# Patient Record
Sex: Female | Born: 1945
Health system: Southern US, Community
[De-identification: ages and names within clinical notes are randomized; demographics above are authoritative.]

## PROBLEM LIST (undated history)

## (undated) DIAGNOSIS — R112 Nausea with vomiting, unspecified: Secondary | ICD-10-CM

## (undated) DIAGNOSIS — M1990 Unspecified osteoarthritis, unspecified site: Secondary | ICD-10-CM

## (undated) DIAGNOSIS — G4730 Sleep apnea, unspecified: Secondary | ICD-10-CM

## (undated) DIAGNOSIS — I10 Essential (primary) hypertension: Secondary | ICD-10-CM

## (undated) DIAGNOSIS — R079 Chest pain, unspecified: Secondary | ICD-10-CM

## (undated) DIAGNOSIS — F419 Anxiety disorder, unspecified: Secondary | ICD-10-CM

## (undated) DIAGNOSIS — E7800 Pure hypercholesterolemia, unspecified: Secondary | ICD-10-CM

## (undated) DIAGNOSIS — R0600 Dyspnea, unspecified: Secondary | ICD-10-CM

## (undated) DIAGNOSIS — K469 Unspecified abdominal hernia without obstruction or gangrene: Secondary | ICD-10-CM

## (undated) DIAGNOSIS — E785 Hyperlipidemia, unspecified: Secondary | ICD-10-CM

## (undated) DIAGNOSIS — K449 Diaphragmatic hernia without obstruction or gangrene: Secondary | ICD-10-CM

## (undated) DIAGNOSIS — K219 Gastro-esophageal reflux disease without esophagitis: Secondary | ICD-10-CM

## (undated) DIAGNOSIS — C801 Malignant (primary) neoplasm, unspecified: Secondary | ICD-10-CM

## (undated) DIAGNOSIS — Z9889 Other specified postprocedural states: Secondary | ICD-10-CM

## (undated) DIAGNOSIS — R0609 Other forms of dyspnea: Secondary | ICD-10-CM

## (undated) DIAGNOSIS — H25019 Cortical age-related cataract, unspecified eye: Secondary | ICD-10-CM

## (undated) HISTORY — DX: Gastro-esophageal reflux disease without esophagitis: K21.9

## (undated) HISTORY — DX: Diaphragmatic hernia without obstruction or gangrene: K44.9

## (undated) HISTORY — PX: APPENDECTOMY: SHX54

## (undated) HISTORY — DX: Unspecified osteoarthritis, unspecified site: M19.90

## (undated) HISTORY — DX: Malignant (primary) neoplasm, unspecified: C80.1

## (undated) HISTORY — DX: Essential (primary) hypertension: I10

## (undated) HISTORY — DX: Pure hypercholesterolemia, unspecified: E78.00

## (undated) HISTORY — PX: CARPAL TUNNEL RELEASE: SHX101

## (undated) HISTORY — DX: Unspecified abdominal hernia without obstruction or gangrene: K46.9

## (undated) HISTORY — PX: MASTECTOMY: SHX3

## (undated) HISTORY — DX: Anxiety disorder, unspecified: F41.9

---

## 1998-12-20 HISTORY — PX: APPENDECTOMY: SHX54

## 2000-07-20 HISTORY — PX: ABDOMINAL HYSTERECTOMY: SHX81

## 2004-12-15 ENCOUNTER — Ambulatory Visit: Payer: Self-pay | Admitting: Unknown Physician Specialty

## 2004-12-17 ENCOUNTER — Ambulatory Visit: Payer: Self-pay | Admitting: Unknown Physician Specialty

## 2005-12-17 ENCOUNTER — Ambulatory Visit: Payer: Self-pay | Admitting: Unknown Physician Specialty

## 2006-03-03 ENCOUNTER — Ambulatory Visit: Payer: Self-pay | Admitting: Unknown Physician Specialty

## 2006-06-10 ENCOUNTER — Ambulatory Visit: Payer: Self-pay | Admitting: Unknown Physician Specialty

## 2007-02-22 ENCOUNTER — Ambulatory Visit: Payer: Self-pay | Admitting: Unknown Physician Specialty

## 2007-03-09 ENCOUNTER — Ambulatory Visit: Payer: Self-pay | Admitting: Unknown Physician Specialty

## 2007-03-20 ENCOUNTER — Ambulatory Visit: Payer: Self-pay | Admitting: Gastroenterology

## 2008-04-09 ENCOUNTER — Ambulatory Visit: Payer: Self-pay | Admitting: Unknown Physician Specialty

## 2009-10-21 ENCOUNTER — Ambulatory Visit: Payer: Self-pay | Admitting: Unknown Physician Specialty

## 2009-10-24 ENCOUNTER — Ambulatory Visit: Payer: Self-pay | Admitting: Unknown Physician Specialty

## 2009-12-20 HISTORY — PX: COLONOSCOPY: SHX174

## 2010-10-23 ENCOUNTER — Ambulatory Visit: Payer: Self-pay | Admitting: Gastroenterology

## 2010-12-07 ENCOUNTER — Encounter: Payer: Self-pay | Admitting: Surgery

## 2010-12-22 ENCOUNTER — Ambulatory Visit: Payer: Self-pay | Admitting: Unknown Physician Specialty

## 2010-12-23 ENCOUNTER — Encounter: Payer: Self-pay | Admitting: Surgery

## 2011-01-12 ENCOUNTER — Ambulatory Visit: Payer: Self-pay | Admitting: Gastroenterology

## 2011-01-20 ENCOUNTER — Encounter: Payer: Self-pay | Admitting: Surgery

## 2011-12-22 ENCOUNTER — Ambulatory Visit: Payer: Self-pay | Admitting: Unknown Physician Specialty

## 2011-12-28 ENCOUNTER — Ambulatory Visit: Payer: Self-pay | Admitting: Unknown Physician Specialty

## 2012-06-28 ENCOUNTER — Ambulatory Visit: Payer: Self-pay | Admitting: Surgery

## 2012-10-25 ENCOUNTER — Encounter: Payer: Self-pay | Admitting: Internal Medicine

## 2012-10-25 ENCOUNTER — Ambulatory Visit (INDEPENDENT_AMBULATORY_CARE_PROVIDER_SITE_OTHER): Payer: Medicare Other | Admitting: Internal Medicine

## 2012-10-25 VITALS — BP 138/75 | HR 83 | Temp 98.0°F | Resp 18 | Ht 64.0 in | Wt 143.0 lb

## 2012-10-25 DIAGNOSIS — R5383 Other fatigue: Secondary | ICD-10-CM

## 2012-10-25 DIAGNOSIS — N6459 Other signs and symptoms in breast: Secondary | ICD-10-CM

## 2012-10-25 DIAGNOSIS — N6452 Nipple discharge: Secondary | ICD-10-CM

## 2012-10-25 DIAGNOSIS — J309 Allergic rhinitis, unspecified: Secondary | ICD-10-CM

## 2012-10-25 DIAGNOSIS — Z23 Encounter for immunization: Secondary | ICD-10-CM

## 2012-10-25 DIAGNOSIS — R5381 Other malaise: Secondary | ICD-10-CM

## 2012-10-25 DIAGNOSIS — E7800 Pure hypercholesterolemia, unspecified: Secondary | ICD-10-CM

## 2012-10-25 DIAGNOSIS — K219 Gastro-esophageal reflux disease without esophagitis: Secondary | ICD-10-CM | POA: Insufficient documentation

## 2012-10-25 DIAGNOSIS — I10 Essential (primary) hypertension: Secondary | ICD-10-CM | POA: Insufficient documentation

## 2012-10-25 DIAGNOSIS — N63 Unspecified lump in unspecified breast: Secondary | ICD-10-CM

## 2012-10-25 DIAGNOSIS — Z0000 Encounter for general adult medical examination without abnormal findings: Secondary | ICD-10-CM

## 2012-10-25 NOTE — Progress Notes (Signed)
Subjective:    Patient ID: Carla Ryan, female    DOB: [birth year], 66 y.o.   MRN: [id number]  HPI 66 year old female with past history of hypertension and hypercholesterolemia who comes in today to follow up on these issues as well as to establish care.  She states that starting after Christmas 2013 she noticed some left breast drainage.   Was evaluated at [facility] and had mammogram and ultrasound 01/03/12.  Test clear.  Saw Dr Hyacinth Meeker.  Had a culture and labs (regarding the discharge) and these were negative.  Stopped her HRT.  Was referred to Dr Katrinka Blazing.  Had a needle biopsy in the office. States it was ok, but she felt the biopsy was done at a different place then where she thought it should have been.  She has some minimal discomfort in the left breast and is still able to express something from the nipple.  She states she had a follow up left breast mammo - 6 months.  She desires a second opinion.    She also has a history of a hiatal hernia and reflux.  Over the last two weeks she has had more trouble with reflux.  On omeprazole.  Some increased drainage.  No dysphagia.  She has been under some increased stress (worrying about her breast and she has been remodeling).   She also has a history of scoliosis.  Some intermittent back pain.  Has seen Dr. Roselyn Bering.    Past Medical History  Diagnosis Date  . Hypertension   . GERD (gastroesophageal reflux disease)   . Hypercholesterolemia   . Osteoarthritis     scoliosis, degenerative disc dz, knee, carpal tunnel  . Anxiety     situational stress and anxiety    Review of Systems Patient denies any headache, lightheadedness or dizziness.  No significant sinus symptoms.  Some drainage.  No chest pain, tightness or palpitations.  No increased shortness of breath, cough or congestion.  No nausea or vomiting. Some increased acid reflux as outlined.  No dysphagia or odynophagia.  No abdominal pain or cramping.  No bowel change, such as diarrhea,  constipation, BRBPR or melana.  No urine change.  Nipple discharge as outlined.       Objective:   Physical Exam Filed Vitals:   10/25/12 0853  BP: 138/75  Pulse: 83  Temp: 98 F (36.7 C)  Resp: 48   66 year old female in no acute distress.   HEENT:  Nares- clear.  Oropharynx - without lesions. NECK:  Supple.  Nontender.  No audible bruit.  HEART:  Appears to be regular. LUNGS:  No crackles or wheezing audible.  Respirations even and unlabored.  RADIAL PULSE:  Equal bilaterally.    BREASTS:  No nipple retraction present. Could express clear fluid from the left breast.  Could not appreciate any distinct nodules or axillary adenopathy.  ABDOMEN:  Soft, nontender.  Bowel sounds present and normal.  No audible abdominal bruit. EXTREMITIES:  No increased edema present.  DP pulses palpable and equal bilaterally.          Assessment & Plan:  NIPPLE DISCHARGE.  Need to obtain records from [facility].  Sounds like she had a culture and probable prolactin level.  Cx negative and apparently prolactin level wnl.  Saw Dr Katrinka Blazing.  S/P a negative biopsy.  Wants a second opinion.  Will refer to Dr Barbaraann Cao for evaluation.    BACK PAIN.  Has scoliosis.  Seeing ortho.   FATIGUE.  Check cbc, met c and tsh.   HEALTH MAINTENANCE.  Physical 12/08/11.  Is s/p hysterectomy.  Colonoscopy 2011 - negative.

## 2012-10-25 NOTE — Patient Instructions (Addendum)
It was nice meeting you today.  I am sorry you have not been feeling well.  I am going to schedule an appt with a surgeon for a second opinion.  We will also schedule a time to have labs.  I want you to continue your omeprazole in the am (as you have been doing) and start Ranitidine 150mg  - take 30 minutes before your evening meal.

## 2012-10-25 NOTE — Assessment & Plan Note (Addendum)
On Omeprazole.  EGD 2011 revealed erosive gastritis.  With some break through symptoms.  Will continue the omeprazole in the am and start Ranitidine 150mg  in the evening.  Follow.  Get her back in soon to reassess.

## 2012-11-01 ENCOUNTER — Other Ambulatory Visit: Payer: Medicare Other

## 2012-11-01 ENCOUNTER — Other Ambulatory Visit (INDEPENDENT_AMBULATORY_CARE_PROVIDER_SITE_OTHER): Payer: Medicare Other

## 2012-11-01 DIAGNOSIS — R5383 Other fatigue: Secondary | ICD-10-CM

## 2012-11-01 DIAGNOSIS — R5381 Other malaise: Secondary | ICD-10-CM

## 2012-11-01 DIAGNOSIS — E7800 Pure hypercholesterolemia, unspecified: Secondary | ICD-10-CM

## 2012-11-01 LAB — CBC WITH DIFFERENTIAL/PLATELET
Eosinophils Relative: 3.1 % (ref 0.0–5.0)
HCT: 40.4 % (ref 36.0–46.0)
Monocytes Relative: 6.4 % (ref 3.0–12.0)
Neutrophils Relative %: 59.1 % (ref 43.0–77.0)
Platelets: 251 10*3/uL (ref 150.0–400.0)
RBC: 4.36 Mil/uL (ref 3.87–5.11)
WBC: 4.8 10*3/uL (ref 4.5–10.5)

## 2012-11-01 LAB — COMPREHENSIVE METABOLIC PANEL
Albumin: 3.8 g/dL (ref 3.5–5.2)
CO2: 28 mEq/L (ref 19–32)
Calcium: 9.3 mg/dL (ref 8.4–10.5)
Chloride: 101 mEq/L (ref 96–112)
GFR: 64.38 mL/min (ref >60.00)
Glucose, Bld: 95 mg/dL (ref 70–99)
Potassium: 3.9 mEq/L (ref 3.5–5.1)
Sodium: 137 mEq/L (ref 135–145)
Total Protein: 6.8 g/dL (ref 6.0–8.3)

## 2012-11-01 LAB — LIPID PANEL: VLDL: 26.2 mg/dL (ref 0.0–40.0)

## 2012-11-01 LAB — TSH: TSH: 1.64 u[IU]/mL (ref 0.35–5.50)

## 2012-11-11 ENCOUNTER — Encounter: Payer: Self-pay | Admitting: Internal Medicine

## 2012-11-11 NOTE — Assessment & Plan Note (Signed)
Blood pressure as outlined.  Same meds.  Check metabolic panel with next labs.  Follow pressures.    

## 2012-11-11 NOTE — Assessment & Plan Note (Signed)
Low cholesterol diet and exercise.  Check lipid panel with next fasting labs.    

## 2012-11-11 NOTE — Assessment & Plan Note (Signed)
Instructed on saline flushes.  Continue Flonase.  Treat reflux.  Follow.

## 2012-12-20 HISTORY — PX: BREAST BIOPSY: SHX20

## 2013-01-03 ENCOUNTER — Ambulatory Visit (INDEPENDENT_AMBULATORY_CARE_PROVIDER_SITE_OTHER): Payer: Medicare Other | Admitting: Internal Medicine

## 2013-01-03 ENCOUNTER — Encounter: Payer: Self-pay | Admitting: Internal Medicine

## 2013-01-03 VITALS — BP 120/70 | HR 88 | Temp 98.4°F | Ht 60.5 in | Wt 139.2 lb

## 2013-01-03 DIAGNOSIS — J309 Allergic rhinitis, unspecified: Secondary | ICD-10-CM

## 2013-01-03 DIAGNOSIS — K219 Gastro-esophageal reflux disease without esophagitis: Secondary | ICD-10-CM

## 2013-01-03 DIAGNOSIS — R410 Disorientation, unspecified: Secondary | ICD-10-CM

## 2013-01-03 DIAGNOSIS — I10 Essential (primary) hypertension: Secondary | ICD-10-CM

## 2013-01-03 DIAGNOSIS — E7800 Pure hypercholesterolemia, unspecified: Secondary | ICD-10-CM

## 2013-01-03 DIAGNOSIS — F29 Unspecified psychosis not due to a substance or known physiological condition: Secondary | ICD-10-CM

## 2013-01-03 LAB — BASIC METABOLIC PANEL
BUN: 17 mg/dL (ref 6–23)
Chloride: 102 mEq/L (ref 96–112)
Glucose, Bld: 78 mg/dL (ref 70–99)
Potassium: 3.8 mEq/L (ref 3.5–5.1)

## 2013-01-07 ENCOUNTER — Encounter: Payer: Self-pay | Admitting: Internal Medicine

## 2013-01-07 NOTE — Assessment & Plan Note (Signed)
Low cholesterol diet and exercise.  Follow lipid panel.   

## 2013-01-07 NOTE — Assessment & Plan Note (Signed)
Continues on omeprazole.  Zantac at night.  Follow.

## 2013-01-07 NOTE — Assessment & Plan Note (Signed)
flonase nasal spray and saline nasal spray as directed.  The previous cough and congestion improved.  Follow.

## 2013-01-07 NOTE — Progress Notes (Signed)
Subjective:    Patient ID: Carla Ryan, female    DOB: 1946-07-12, 67 y.o.   MRN: [id number]  HPI 67 year old female with past history of hypertension and hypercholesterolemia who comes in today for a scheduled follow up.  She states that starting after Christmas 2013 she noticed some left breast drainage.   Was evaluated at [facility] and had mammogram and ultrasound 01/03/12.  Test clear.  Saw Dr Hyacinth Meeker.  Had a culture and labs (regarding the discharge) and these were negative.  Stopped her HRT.  Was referred to Dr Katrinka Blazing.  Had a needle biopsy in the office. States it was ok, but she felt the biopsy was done at a different place then where she thought it should have been.  See last note for details.  She desires a second opinion.  Now seeing Dr Barbaraann Cao.  Planning for a biopsy 01/15/13.  She also reports that 12/19/12 she was sick.  Had fever.  Increased congestion and cough.  Was riding in the car and had a "violent" coughing episode.  Husband pulled over to get her something to drink.  States he was gone out of the car for approximately 10 minutes.   When he returned, she was confused.  She was asking questions about her family, etc.  No slurring of speech.  No other focal neurological changes.  She was questioning whether or not she could have fallen asleep.  She then went in to check her blood pressure and used the bathroom.  She did not initially remember if she had checked her blood pressure.  She quickly came back to her normal self and has had no episodes since (or prior).  Still has a little congestion in her throat.  Doing much better.  She also has a history of a hiatal hernia and reflux.  On omeprazole.  No dysphagia.     Past Medical History  Diagnosis Date  . Hypertension   . GERD (gastroesophageal reflux disease)   . Hypercholesterolemia   . Osteoarthritis     scoliosis, degenerative disc dz, knee, carpal tunnel  . Anxiety     situational stress and anxiety     Current  Outpatient Prescriptions on File Prior to Visit  Medication Sig Dispense Refill  . Biotin 5000 MCG TABS Take 1 tablet by mouth daily.      . Calcium Carbonate-Vitamin D (CALCIUM 600 + D PO) Take 600 mg by mouth 2 (two) times daily.      . cholecalciferol (VITAMIN D) 1000 UNITS tablet Take 2,000 Units by mouth daily.       . fexofenadine (ALLEGRA) 180 MG tablet Take 180 mg by mouth daily.      . fluticasone (FLONASE) 50 MCG/ACT nasal spray Place 2 sprays into the nose daily.      . Multiple Vitamin (MULTIVITAMIN) tablet Take 1 tablet by mouth daily.      Marland Kitchen omeprazole (PRILOSEC) 20 MG capsule Take 2 by mouth every morning      . ranitidine (ZANTAC) 150 MG tablet Take 150 mg by mouth daily.      Marland Kitchen triamterene-hydrochlorothiazide (DYAZIDE) 37.5-25 MG per capsule Take 1 capsule by mouth daily.      Marland Kitchen aspirin 81 MG tablet Take 81 mg by mouth daily.      . fish oil-omega-3 fatty acids 1000 MG capsule Take 1 g by mouth daily.        Review of Systems  Patient denies any headache, lightheadedness or dizziness.  No significant sinus symptoms.  Some drainage.  No chest pain, tightness or palpitations.  No increased shortness of breath, cough or congestion.  No nausea or vomiting. Some increased acid reflux as outlined.  No dysphagia or odynophagia.  No abdominal pain or cramping.  No bowel change, such as diarrhea, constipation, BRBPR or melana.  No urine change.  Nipple discharge as outlined.       Objective:   Physical Exam  Filed Vitals:   01/03/13 0903  BP: 120/70  Pulse: 88  Temp: 98.4 F (36.9 C)   Blood pressure recheck:  148/82, pulse 57  67 year old female in no acute distress.   HEENT:  Nares- clear.  Oropharynx - without lesions. NECK:  Supple.  Nontender.  No audible bruit.  HEART:  Appears to be regular. LUNGS:  No crackles or wheezing audible.  Respirations even and unlabored.  RADIAL PULSE:  Equal bilaterally.  ABDOMEN:  Soft, nontender.  Bowel sounds present and normal.  No  audible abdominal bruit. EXTREMITIES:  No increased edema present.  DP pulses palpable and equal bilaterally.          Assessment & Plan:  NIPPLE DISCHARGE.  See last note for details.  Seeing Dr Barbaraann Cao now.  Planning for evaluation 01/15/13.   MENTAL STATUS CHANGES.  See above.  Unclear as to the exact etiology.  It could be related to her being sick and possibly falling asleep.  Consider the possibility of TIA.  Symptoms completely resolved and have not reoccurred.  Recommend daily aspirin.  Will schedule MRI brain and carotid ultrasound to evaluate further.  Follow closely.  If any change in symptoms or reoccurring symptoms, she is to be evaluated immediately.     BACK PAIN.  Has scoliosis.  Seeing ortho.   HEALTH MAINTENANCE.  Physical 12/08/11.  Is s/p hysterectomy.  Colonoscopy 2011 - negative.  Schedule a physical next visit.

## 2013-01-07 NOTE — Assessment & Plan Note (Signed)
Blood pressure a little elevated on my check.  Have her spot check her pressure.  Follow.

## 2013-01-10 ENCOUNTER — Ambulatory Visit: Payer: Self-pay | Admitting: Internal Medicine

## 2013-01-12 ENCOUNTER — Telehealth: Payer: Self-pay | Admitting: Internal Medicine

## 2013-01-12 NOTE — Telephone Encounter (Signed)
You have results.  Please notify pt 

## 2013-01-12 NOTE — Telephone Encounter (Signed)
Pt called stating she left message on voice mail checking ultra sound and mri  rsults she had done wednesay.  Pt would like to get results prior to surgery on monday

## 2013-01-12 NOTE — Telephone Encounter (Signed)
Left message on patient's home voice mail that results were all ok.

## 2013-01-12 NOTE — Telephone Encounter (Signed)
You have results.  Please notify pt

## 2013-01-12 NOTE — Telephone Encounter (Signed)
This is your pt

## 2013-01-24 ENCOUNTER — Encounter: Payer: Self-pay | Admitting: Internal Medicine

## 2013-02-09 ENCOUNTER — Telehealth: Payer: Self-pay | Admitting: Internal Medicine

## 2013-02-09 NOTE — Telephone Encounter (Signed)
Needing refills on Triamterene/ HCTZ 37.5/25 mg and Fluticasone Propinate nasal spray 50 mcg's. Pt uses [facility].

## 2013-02-12 MED ORDER — TRIAMTERENE-HCTZ 37.5-25 MG PO CAPS: 1.0000 | ORAL_CAPSULE | Freq: Every day | ORAL | Status: DC

## 2013-02-12 MED ORDER — FLUTICASONE PROPIONATE 50 MCG/ACT NA SUSP: 2.0000 | Freq: Every day | NASAL | Status: DC

## 2013-02-12 NOTE — Telephone Encounter (Signed)
Sent in to pharmacy.  

## 2013-02-20 ENCOUNTER — Ambulatory Visit: Payer: Medicare Other | Admitting: Internal Medicine

## 2013-02-26 ENCOUNTER — Encounter: Payer: Self-pay | Admitting: Internal Medicine

## 2013-02-26 ENCOUNTER — Ambulatory Visit (INDEPENDENT_AMBULATORY_CARE_PROVIDER_SITE_OTHER): Payer: Medicare Other | Admitting: Internal Medicine

## 2013-02-26 VITALS — BP 110/60 | HR 75 | Temp 98.4°F | Ht 60.5 in | Wt 141.8 lb

## 2013-02-26 DIAGNOSIS — J309 Allergic rhinitis, unspecified: Secondary | ICD-10-CM

## 2013-02-26 DIAGNOSIS — Z1239 Encounter for other screening for malignant neoplasm of breast: Secondary | ICD-10-CM

## 2013-02-26 DIAGNOSIS — E7800 Pure hypercholesterolemia, unspecified: Secondary | ICD-10-CM

## 2013-02-26 DIAGNOSIS — K219 Gastro-esophageal reflux disease without esophagitis: Secondary | ICD-10-CM

## 2013-02-26 DIAGNOSIS — M549 Dorsalgia, unspecified: Secondary | ICD-10-CM

## 2013-02-26 DIAGNOSIS — I10 Essential (primary) hypertension: Secondary | ICD-10-CM

## 2013-02-26 NOTE — Progress Notes (Signed)
Subjective:    Patient ID: Carla Ryan, female    DOB: 03/27/1946, 67 y.o.   MRN: [id number]  HPI 67 year old female with past history of hypertension and hypercholesterolemia who comes in today for a scheduled follow up.  She states that starting after Christmas 2013 she noticed some left breast drainage.   Was evaluated at [facility] and had mammogram and ultrasound 01/03/12.  Test clear.  Saw Dr Hyacinth Meeker.  Had a culture and labs (regarding the discharge) and these were negative.  Stopped her HRT.  Was referred to Dr Katrinka Blazing.  Had a needle biopsy in the office. States it was ok, but she felt the biopsy was done at a different place then where she thought it should have been.  See last note for details.  She desired a second opinion.  Saw Dr Carla Ryan.  Had biopsy 01/15/13 and subsequently underwent a needle local excision of the area and the pathology returned benign - intraductal papilloma.  She has done well.  No further drainage.  She does report some persistent intermittent back/shoulder discomfort.  Flares intermittently.  Worse over the last few weeks.  Has tried muscle relaxors and physical therapy without relief.  Tylenol does seem to help some.  She request referral to ortho for evaluation.  No radiation of the pain down her arms.  She also is still having some reflux despite omeprazole in the am and zantac in the evening.  Previously had some RUQ discomfort.  Has had this intermittently for years.  Has had an abdominal ultrasound and HIDA scan x 2.  No pain now.     Past Medical History  Diagnosis Date  . Hypertension   . GERD (gastroesophageal reflux disease)   . Hypercholesterolemia   . Osteoarthritis     scoliosis, degenerative disc dz, knee, carpal tunnel  . Anxiety     situational stress and anxiety     Current Outpatient Prescriptions on File Prior to Visit  Medication Sig Dispense Refill  . aspirin 81 MG tablet Take 81 mg by mouth daily.      . Biotin 5000 MCG TABS Take 1  tablet by mouth daily.      . Calcium Carbonate-Vitamin D (CALCIUM 600 + D PO) Take 600 mg by mouth 2 (two) times daily.      . cholecalciferol (VITAMIN D) 1000 UNITS tablet Take 2,000 Units by mouth daily.       . fexofenadine (ALLEGRA) 180 MG tablet Take 180 mg by mouth daily.      . fish oil-omega-3 fatty acids 1000 MG capsule Take 1 g by mouth daily.      . fluticasone (FLONASE) 50 MCG/ACT nasal spray Place 2 sprays into the nose daily.  16 g  1  . Multiple Vitamin (MULTIVITAMIN) tablet Take 1 tablet by mouth daily.      Marland Kitchen omeprazole (PRILOSEC) 20 MG capsule Take 2 by mouth every morning      . ranitidine (ZANTAC) 150 MG tablet Take 150 mg by mouth daily.      Marland Kitchen triamterene-hydrochlorothiazide (DYAZIDE) 37.5-25 MG per capsule Take 1 each (1 capsule total) by mouth daily.  90 capsule  1   No current facility-administered medications on file prior to visit.    Review of Systems Patient denies any headache, lightheadedness or dizziness.  No significant sinus symptoms.  No chest pain, tightness or palpitations.  No increased shortness of breath, cough or congestion.  No nausea or vomiting. Some increased acid reflux  as outlined.  No dysphagia or odynophagia.  No abdominal pain or cramping.  No bowel change, such as diarrhea, constipation, BRBPR or melana.  No urine change.  Nipple discharge resolved.  No further episodes of mental status changes, etc.       Objective:   Physical Exam  Filed Vitals:   02/26/13 1605  BP: 110/60  Pulse: 75  Temp: 98.4 F (36.9 C)   Blood pressure recheck:  140/78, pulse 61  67 year old female in no acute distress.   HEENT:  Nares- clear.  Oropharynx - without lesions. NECK:  Supple.  Nontender.  No audible bruit.  HEART:  Appears to be regular. LUNGS:  No crackles or wheezing audible.  Respirations even and unlabored.  RADIAL PULSE:  Equal bilaterally.  ABDOMEN:  Soft, nontender.  Bowel sounds present and normal.  No audible abdominal  bruit. EXTREMITIES:  No increased edema present.  DP pulses palpable and equal bilaterally.          Assessment & Plan:  NIPPLE DISCHARGE.  See last note for details.  Saw Dr Carla Ryan.  S/p excision - intraductal papilloma - benign.  Discharge resolved.  Doing well.    MENTAL STATUS CHANGES.  See last note.  Unclear as to the exact etiology.  Taking daily aspirin.  MRI revealed no acute changes.  Carotid ultrasound negative for any hemodynamically significant stenosis.  No reoccurrence.  Discussed further w/up - including neurology referral.  Wants to monitor.   BACK PAIN.  Upper back.  See above.  Request referral to ortho.  Has tried physical therapy - worsened.  Muscle relaxant - no help.    HEALTH MAINTENANCE.  Physical 12/08/11.  Is s/p hysterectomy.  Colonoscopy 2011 - negative.

## 2013-02-26 NOTE — Assessment & Plan Note (Signed)
Blood pressure as outlined.  Same meds.  Check metabolic panel with next labs.  Follow pressures.

## 2013-02-26 NOTE — Assessment & Plan Note (Signed)
On Omeprazole.  EGD 2011 revealed erosive gastritis.  With some break through symptoms.  Also taking zantac in the evening.  Will refer to GI for evaluation.

## 2013-02-26 NOTE — Assessment & Plan Note (Signed)
Continue Flonase.  Treat reflux.  Follow.

## 2013-02-26 NOTE — Assessment & Plan Note (Signed)
Low cholesterol diet and exercise.  Check lipid panel with next fasting labs.    

## 2013-03-14 ENCOUNTER — Ambulatory Visit: Payer: Self-pay | Admitting: Internal Medicine

## 2013-03-21 ENCOUNTER — Encounter: Payer: Self-pay | Admitting: Internal Medicine

## 2013-03-21 DIAGNOSIS — G4733 Obstructive sleep apnea (adult) (pediatric): Secondary | ICD-10-CM

## 2013-04-03 ENCOUNTER — Encounter: Payer: Self-pay | Admitting: Internal Medicine

## 2013-05-29 ENCOUNTER — Ambulatory Visit (INDEPENDENT_AMBULATORY_CARE_PROVIDER_SITE_OTHER): Payer: Medicare Other | Admitting: Internal Medicine

## 2013-05-29 ENCOUNTER — Encounter: Payer: Self-pay | Admitting: Internal Medicine

## 2013-05-29 VITALS — BP 158/84 | HR 75 | Temp 98.3°F | Ht 60.5 in | Wt 146.5 lb

## 2013-05-29 DIAGNOSIS — J309 Allergic rhinitis, unspecified: Secondary | ICD-10-CM

## 2013-05-29 DIAGNOSIS — E7800 Pure hypercholesterolemia, unspecified: Secondary | ICD-10-CM

## 2013-05-29 DIAGNOSIS — K219 Gastro-esophageal reflux disease without esophagitis: Secondary | ICD-10-CM

## 2013-05-29 DIAGNOSIS — I10 Essential (primary) hypertension: Secondary | ICD-10-CM

## 2013-05-29 MED ORDER — TRIAMTERENE-HCTZ 37.5-25 MG PO CAPS: 1.0000 | ORAL_CAPSULE | Freq: Every day | ORAL | Status: DC

## 2013-05-29 MED ORDER — FLUTICASONE PROPIONATE 50 MCG/ACT NA SUSP: 2.0000 | Freq: Every day | NASAL | Status: DC

## 2013-05-31 ENCOUNTER — Telehealth: Payer: Self-pay | Admitting: Internal Medicine

## 2013-05-31 ENCOUNTER — Encounter: Payer: Self-pay | Admitting: Internal Medicine

## 2013-05-31 NOTE — Assessment & Plan Note (Signed)
Low cholesterol diet and exercise.  Check lipid panel with next fasting labs.    

## 2013-05-31 NOTE — Telephone Encounter (Signed)
Changed to cpe

## 2013-05-31 NOTE — Assessment & Plan Note (Signed)
Continue Flonase.  Treat reflux.  Follow.

## 2013-05-31 NOTE — Progress Notes (Signed)
Subjective:    Patient ID: Carla Ryan, female    DOB: 1946-01-31, 67 y.o.   MRN: [id number]  HPI 67 year old female with past history of hypertension and hypercholesterolemia who comes in today for a scheduled follow up.  She states that starting after Christmas 2013 she noticed some left breast drainage.   Was evaluated at [facility] and had mammogram and ultrasound 01/03/12.  Test clear.  Saw Dr Hyacinth Meeker.  Had a culture and labs (regarding the discharge) and these were negative.  Stopped her HRT.  Was referred to Dr Katrinka Blazing.  Had a needle biopsy in the office. States it was ok, but she felt the biopsy was done at a different place then where she thought it should have been.  See last note for details.  She desired a second opinion.  Saw Dr Barbaraann Cao.  Had biopsy 01/15/13 and subsequently underwent a needle local excision of the area and the pathology returned benign - intraductal papilloma.  She has done well.  No further drainage.  She does report some persistent intermittent back/shoulder discomfort.  Flares intermittently.  Has tried muscle relaxors and physical therapy without relief.  Tylenol does seem to help some.  After last visit, she saw Dr Hyacinth Meeker [facility] ortho). She is now going to [facility].   She also saw Dr Marva Panda.  Was placed on Dexilant.  This has helped the reflux symptoms.  Stools more formed.  Occasional burping.  When she saw Dr Marva Panda, she had an eye infection.  Was referred to [facility].  Given Tobradex.  Improved, but starting to have some irritation again.  Plans to call them today.  Overall she feels she is doing relatively well.  Blood pressure at home averaging 120s/60s.  138/72 - highest readin).       Past Medical History  Diagnosis Date  . Hypertension   . GERD (gastroesophageal reflux disease)   . Hypercholesterolemia   . Osteoarthritis     scoliosis, degenerative disc dz, knee, carpal tunnel  . Anxiety     situational stress and anxiety     Current Outpatient Prescriptions on File Prior to Visit  Medication Sig Dispense Refill  . aspirin 81 MG tablet Take 81 mg by mouth daily.      . Biotin 5000 MCG TABS Take 1 tablet by mouth daily.      . Calcium Carbonate-Vitamin D (CALCIUM 600 + D PO) Take 600 mg by mouth 2 (two) times daily.      . cholecalciferol (VITAMIN D) 1000 UNITS tablet Take 2,000 Units by mouth daily.       . fexofenadine (ALLEGRA) 180 MG tablet Take 180 mg by mouth daily.      . fish oil-omega-3 fatty acids 1000 MG capsule Take 1 g by mouth daily.      . Multiple Vitamin (MULTIVITAMIN) tablet Take 1 tablet by mouth daily.      Marland Kitchen omeprazole (PRILOSEC) 20 MG capsule Take 2 by mouth every morning      . ranitidine (ZANTAC) 150 MG tablet Take 150 mg by mouth daily.       No current facility-administered medications on file prior to visit.    Review of Systems Patient denies any headache, lightheadedness or dizziness.  Eye infection as outlined.  No significant sinus symptoms.  No chest pain, tightness or palpitations.  No increased shortness of breath, cough or congestion.  No nausea or vomiting. Acid reflux better on dexilant.  No dysphagia or odynophagia.  No abdominal pain or cramping.  No bowel change, such as diarrhea, constipation, BRBPR or melana.  No urine change.  Nipple discharge resolved.  Blood pressures as outlined.       Objective:   Physical Exam  Filed Vitals:   05/29/13 0938  BP: 158/84  Pulse: 75  Temp: 98.3 F (36.8 C)   Blood pressure recheck:  148-150/78, pulse 66  67 year old female in no acute distress.   HEENT:  Nares- clear.  Oropharynx - without lesions. NECK:  Supple.  Nontender.  No audible bruit.  HEART:  Appears to be regular. LUNGS:  No crackles or wheezing audible.  Respirations even and unlabored.  RADIAL PULSE:  Equal bilaterally.  ABDOMEN:  Soft, nontender.  Bowel sounds present and normal.  No audible abdominal bruit. EXTREMITIES:  No increased edema present.  DP  pulses palpable and equal bilaterally.          Assessment & Plan:  NIPPLE DISCHARGE.  See last note for details.  Saw Dr Barbaraann Cao.  S/p excision - intraductal papilloma - benign.  Discharge resolved.  Doing well.  Mammogram 03/14/13 - Birads II.    MENTAL STATUS CHANGES.  See previous notes.  Unclear as to the exact etiology.  Taking daily aspirin.  MRI revealed no acute changes.  Carotid ultrasound negative for any hemodynamically significant stenosis.  No reoccurrence.  Have discussed further w/up - including neurology referral.  Wants to monitor.  No reoccurring symptoms.   BACK PAIN.  Upper back.  Saw ortho.  Has had therapy.  Now seeing a chiropractor.     EYE INFECTION.  Seeing opthalmology.  Improved.   HEALTH MAINTENANCE.  Physical 12/08/11.  Is s/p hysterectomy.  Colonoscopy 2011 - negative.  Needs to be scheduled for a physical.

## 2013-05-31 NOTE — Telephone Encounter (Signed)
Please change her next appt to a physical instead of a follow up appt.  Thanks.

## 2013-05-31 NOTE — Assessment & Plan Note (Signed)
Blood pressure as outlined.  Same meds.  Follow metabolic panel.  Follow pressures. Appear to be under good control on outside checks.

## 2013-05-31 NOTE — Assessment & Plan Note (Signed)
EGD 2011 revealed erosive gastritis.  Saw Dr Marva Panda.  On Dexilant now.  Better.  Follow.

## 2013-06-01 ENCOUNTER — Other Ambulatory Visit: Payer: Self-pay | Admitting: *Deleted

## 2013-06-01 MED ORDER — TRIAMTERENE-HCTZ 37.5-25 MG PO CAPS: 1.0000 | ORAL_CAPSULE | Freq: Every day | ORAL | Status: DC

## 2013-06-15 ENCOUNTER — Other Ambulatory Visit: Payer: Medicare Other

## 2013-06-19 ENCOUNTER — Other Ambulatory Visit (INDEPENDENT_AMBULATORY_CARE_PROVIDER_SITE_OTHER): Payer: Medicare Other

## 2013-06-19 DIAGNOSIS — E7800 Pure hypercholesterolemia, unspecified: Secondary | ICD-10-CM

## 2013-06-19 DIAGNOSIS — I10 Essential (primary) hypertension: Secondary | ICD-10-CM

## 2013-06-19 LAB — LIPID PANEL
Cholesterol: 256 mg/dL — ABNORMAL HIGH (ref 0–200)
HDL: 62.9 mg/dL (ref >39.00)
Triglycerides: 146 mg/dL (ref 0.0–149.0)

## 2013-06-19 LAB — COMPREHENSIVE METABOLIC PANEL
Alkaline Phosphatase: 61 U/L (ref 39–117)
BUN: 21 mg/dL (ref 6–23)
Creatinine, Ser: 0.9 mg/dL (ref 0.4–1.2)
GFR: 66.77 mL/min (ref >60.00)
Glucose, Bld: 85 mg/dL (ref 70–99)
Total Bilirubin: 0.8 mg/dL (ref 0.3–1.2)

## 2013-06-19 LAB — LDL CHOLESTEROL, DIRECT: Direct LDL: 162.1 mg/dL

## 2013-07-10 ENCOUNTER — Ambulatory Visit (INDEPENDENT_AMBULATORY_CARE_PROVIDER_SITE_OTHER): Payer: Medicare Other | Admitting: Internal Medicine

## 2013-07-10 ENCOUNTER — Encounter: Payer: Self-pay | Admitting: Internal Medicine

## 2013-07-10 VITALS — BP 124/70 | HR 72 | Temp 98.0°F | Ht 60.5 in | Wt 145.5 lb

## 2013-07-10 DIAGNOSIS — E7800 Pure hypercholesterolemia, unspecified: Secondary | ICD-10-CM

## 2013-07-10 DIAGNOSIS — I10 Essential (primary) hypertension: Secondary | ICD-10-CM

## 2013-07-11 ENCOUNTER — Encounter: Payer: Self-pay | Admitting: Internal Medicine

## 2013-07-11 NOTE — Assessment & Plan Note (Signed)
Blood pressure as outlined.  Same meds.  Follow metabolic panel.  Follow pressures.

## 2013-07-11 NOTE — Progress Notes (Signed)
Subjective:    Patient ID: Carla Ryan, female    DOB: 08-09-1946, 67 y.o.   MRN: [id number]  HPI 67 year old female with past history of hypertension and hypercholesterolemia who comes in today for a scheduled follow up.  She states that starting after Christmas 2013 she noticed some left breast drainage.   Was evaluated at [facility] and had mammogram and ultrasound 01/03/12.  Test clear.  Saw Dr Hyacinth Meeker.  Had a culture and labs (regarding the discharge) and these were negative.  Stopped her HRT.  Was referred to Dr Katrinka Blazing.  Had a needle biopsy in the office. States it was ok, but she felt the biopsy was done at a different place then where she thought it should have been.  She desired a second opinion.  Saw Dr Barbaraann Cao.  Had biopsy 01/15/13 and subsequently underwent a needle local excision of the area and the pathology returned benign - intraductal papilloma.  She has done well.  No further drainage.  She also saw Dr Marva Panda.  Was placed on Dexilant.  This has helped the reflux symptoms.  Stools more formed.  Overall she feels she is doing relatively well.  Here today to discuss her elevated cholesterol.        Past Medical History  Diagnosis Date  . Hypertension   . GERD (gastroesophageal reflux disease)   . Hypercholesterolemia   . Osteoarthritis     scoliosis, degenerative disc dz, knee, carpal tunnel  . Anxiety     situational stress and anxiety     Current Outpatient Prescriptions on File Prior to Visit  Medication Sig Dispense Refill  . aspirin 81 MG tablet Take 81 mg by mouth daily.      . Biotin 5000 MCG TABS Take 1 tablet by mouth daily.      . Calcium Carbonate-Vitamin D (CALCIUM 600 + D PO) Take 600 mg by mouth 2 (two) times daily.      . cholecalciferol (VITAMIN D) 1000 UNITS tablet Take 2,000 Units by mouth daily.       Marland Kitchen dexlansoprazole (DEXILANT) 60 MG capsule Take 60 mg by mouth daily.      . fexofenadine (ALLEGRA) 180 MG tablet Take 180 mg by mouth daily.      .  fish oil-omega-3 fatty acids 1000 MG capsule Take 1 g by mouth daily.      . fluticasone (FLONASE) 50 MCG/ACT nasal spray Place 2 sprays into the nose daily.  48 g  3  . methocarbamol (ROBAXIN) 500 MG tablet Take 500 mg by mouth 2 (two) times daily as needed.      . Multiple Vitamin (MULTIVITAMIN) tablet Take 1 tablet by mouth daily.      Marland Kitchen triamterene-hydrochlorothiazide (DYAZIDE) 37.5-25 MG per capsule Take 1 each (1 capsule total) by mouth daily.  90 capsule  3   No current facility-administered medications on file prior to visit.    Review of Systems Patient denies any headache, lightheadedness or dizziness.  No significant sinus symptoms.  No chest pain, tightness or palpitations.  No increased shortness of breath, cough or congestion.  No nausea or vomiting. Acid reflux better on dexilant.  No dysphagia or odynophagia.  No abdominal pain or cramping.  No bowel change, such as diarrhea, constipation, BRBPR or melana.  No urine change.      Objective:   Physical Exam  Filed Vitals:   07/10/13 1120  BP: 124/70  Pulse: 72  Temp: 98 F (36.7 C)  67 year old female in no acute distress.   Physical exam not performed given nature of this visit.           Assessment & Plan:  NIPPLE DISCHARGE.  Saw Dr Barbaraann Cao.  S/p excision - intraductal papilloma - benign.  Discharge resolved.  Doing well.  Mammogram 03/14/13 - Birads II.    BACK PAIN.  Upper back.  Saw ortho.  Has had therapy.  Now seeing a chiropractor.   HEALTH MAINTENANCE.   Is s/p hysterectomy.  Colonoscopy 2011 - negative.  Mammogram as outlined.    I spent 15 minutes with this patient and more than 50% of the time was spent in consultation regarding the above.

## 2013-07-11 NOTE — Assessment & Plan Note (Signed)
Low cholesterol diet and exercise.  Recent cholesterol panel revealed total cholesterol 256, triglycerides 146, HDL 63 and LDL 162.  Discussed my desire to start medication.  She declines.  Follow lipid panel.

## 2013-07-25 ENCOUNTER — Other Ambulatory Visit: Payer: Self-pay

## 2013-09-20 ENCOUNTER — Other Ambulatory Visit (INDEPENDENT_AMBULATORY_CARE_PROVIDER_SITE_OTHER): Payer: Medicare Other

## 2013-09-20 DIAGNOSIS — E7800 Pure hypercholesterolemia, unspecified: Secondary | ICD-10-CM

## 2013-09-20 LAB — LDL CHOLESTEROL, DIRECT: Direct LDL: 149.8 mg/dL

## 2013-09-20 LAB — LIPID PANEL
HDL: 64.9 mg/dL (ref >39.00)
Triglycerides: 103 mg/dL (ref 0.0–149.0)
VLDL: 20.6 mg/dL (ref 0.0–40.0)

## 2013-09-21 ENCOUNTER — Encounter: Payer: Self-pay | Admitting: Internal Medicine

## 2013-09-28 ENCOUNTER — Ambulatory Visit (INDEPENDENT_AMBULATORY_CARE_PROVIDER_SITE_OTHER): Payer: Medicare Other | Admitting: Internal Medicine

## 2013-09-28 ENCOUNTER — Encounter: Payer: Self-pay | Admitting: Internal Medicine

## 2013-09-28 VITALS — BP 130/90 | HR 79 | Temp 98.3°F | Ht 59.5 in | Wt 141.2 lb

## 2013-09-28 DIAGNOSIS — Z1211 Encounter for screening for malignant neoplasm of colon: Secondary | ICD-10-CM

## 2013-09-28 DIAGNOSIS — R5383 Other fatigue: Secondary | ICD-10-CM

## 2013-09-28 DIAGNOSIS — R0609 Other forms of dyspnea: Secondary | ICD-10-CM

## 2013-09-28 DIAGNOSIS — R0683 Snoring: Secondary | ICD-10-CM

## 2013-09-28 DIAGNOSIS — E7800 Pure hypercholesterolemia, unspecified: Secondary | ICD-10-CM

## 2013-09-28 DIAGNOSIS — K219 Gastro-esophageal reflux disease without esophagitis: Secondary | ICD-10-CM

## 2013-09-28 DIAGNOSIS — Z23 Encounter for immunization: Secondary | ICD-10-CM

## 2013-09-28 DIAGNOSIS — I10 Essential (primary) hypertension: Secondary | ICD-10-CM

## 2013-09-28 DIAGNOSIS — J309 Allergic rhinitis, unspecified: Secondary | ICD-10-CM

## 2013-09-28 DIAGNOSIS — R5381 Other malaise: Secondary | ICD-10-CM

## 2013-10-01 ENCOUNTER — Encounter: Payer: Self-pay | Admitting: Internal Medicine

## 2013-10-01 DIAGNOSIS — R079 Chest pain, unspecified: Secondary | ICD-10-CM | POA: Insufficient documentation

## 2013-10-01 NOTE — Assessment & Plan Note (Signed)
Pt describes increased snoring, frequent awakenings, increased daytime somnolence and fatigue.  Blood pressure elevated today.  Schedule her for a split night sleep study.

## 2013-10-01 NOTE — Assessment & Plan Note (Signed)
Low cholesterol diet and exercise.  Recent cholesterol panel revealed total cholesterol 232, triglycerides 103, HDL 65 and LDL 149.   Have discussed my desire to start medication.  She declines.  Follow lipid panel.

## 2013-10-01 NOTE — Assessment & Plan Note (Signed)
EGD 2011 revealed erosive gastritis.  Saw Dr Marva Panda.  On Dexilant now.  Better.  Follow.

## 2013-10-01 NOTE — Assessment & Plan Note (Addendum)
Blood pressure as outlined.  Continue same meds.  Follow metabolic panel.  Follow pressures.  Obtain sleep study.  Have her spot check her pressures.  Get her back in soon to reassess.

## 2013-10-01 NOTE — Progress Notes (Signed)
Subjective:    Patient ID: Carla Ryan, female    DOB: [birth year], 67 y.o.   MRN: [id number]  HPI 67 year old female with past history of hypertension and hypercholesterolemia who comes in today to follow up on these issues as well as for a complete physical exam.  She states that starting after Christmas 2013 she noticed some left breast drainage.   Was evaluated at [facility] and had mammogram and ultrasound 01/03/12.  Test clear.  Saw Dr Hyacinth Meeker.  Had a culture and labs (regarding the discharge) and these were negative.  Stopped her HRT.  Was referred to Dr Katrinka Blazing.  Had a needle biopsy in the office. States it was ok, but she felt the biopsy was done at a different place then where she thought it should have been.  She desired a second opinion.  Saw Dr Barbaraann Cao.  Had biopsy 01/15/13 and subsequently underwent a needle local excision of the area and the pathology returned benign - intraductal papilloma.  She has done well.  No further drainage.  She also saw Dr Marva Panda.  Was placed on Dexilant.  This has helped the reflux symptoms.  Stools more formed.  Overall she feels she is doing relatively well.  She does report some increased daytime somnolence.  Also has some acute awakenings at night.  Startles her.  Family reports increased snoring.  Concern regarding sleep apnea.  She also reports some back and right shoulder discomfort intermittently.  Feels stable.  Cholesterol reviewed and has improved some.          Past Medical History  Diagnosis Date  . Hypertension   . GERD (gastroesophageal reflux disease)   . Hypercholesterolemia   . Osteoarthritis     scoliosis, degenerative disc dz, knee, carpal tunnel  . Anxiety     situational stress and anxiety     Current Outpatient Prescriptions on File Prior to Visit  Medication Sig Dispense Refill  . aspirin 81 MG tablet Take 81 mg by mouth daily.      . Biotin 5000 MCG TABS Take 1 tablet by mouth daily.      . Calcium Carbonate-Vitamin D  (CALCIUM 600 + D PO) Take 600 mg by mouth 2 (two) times daily.      . cholecalciferol (VITAMIN D) 1000 UNITS tablet Take 2,000 Units by mouth daily.       Marland Kitchen CINNAMON PO Take by mouth 2 (two) times daily.      Marland Kitchen dexlansoprazole (DEXILANT) 60 MG capsule Take 60 mg by mouth daily.      . fexofenadine (ALLEGRA) 180 MG tablet Take 180 mg by mouth as needed.       . fish oil-omega-3 fatty acids 1000 MG capsule Take 2 g by mouth daily.       . fluticasone (FLONASE) 50 MCG/ACT nasal spray Place 2 sprays into the nose daily.  48 g  3  . Multiple Vitamin (MULTIVITAMIN) tablet Take 1 tablet by mouth daily.       No current facility-administered medications on file prior to visit.    Review of Systems Patient denies any headache, lightheadedness or dizziness.  No significant sinus symptoms.  No chest pain, tightness or palpitations.  No increased shortness of breath, cough or congestion.  No nausea or vomiting. Acid reflux better on dexilant.  No dysphagia or odynophagia.  No abdominal pain or cramping.  No bowel change, such as diarrhea, constipation, BRBPR or melana.  No urine change. Increased fatigue and daytime  somnolence as outlined.  Increased snoring.  Frequent awakenings at night.  States had recent cardiac w/up - negative.       Objective:   Physical Exam  Filed Vitals:   09/28/13 1001  BP: 130/90  Pulse: 79  Temp: 98.3 F (54.54 C)   67 year old female in no acute distress.   HEENT:  Nares- clear.  Oropharynx - without lesions. NECK:  Supple.  Nontender.  No audible bruit.  HEART:  Appears to be regular. LUNGS:  No crackles or wheezing audible.  Respirations even and unlabored.  RADIAL PULSE:  Equal bilaterally.    BREASTS:  No nipple discharge or nipple retraction present.  Could not appreciate any distinct nodules or axillary adenopathy.  ABDOMEN:  Soft, nontender.  Bowel sounds present and normal.  No audible abdominal bruit.  GU: she declined.   EXTREMITIES:  No increased edema  present.  DP pulses palpable and equal bilaterally.          Assessment & Plan:  NIPPLE DISCHARGE.  Saw Dr Barbaraann Cao.  S/p excision - intraductal papilloma - benign.  Discharge resolved.  Doing well.  Mammogram 03/14/13 - Birads II.    BACK PAIN.  Upper back.  Saw ortho.  Has had therapy.  Now seeing a chiropractor.  Feels stable.   HEALTH MAINTENANCE.   Is s/p hysterectomy.  Colonoscopy 2011 - negative.  Mammogram as outlined.    IFOB.

## 2013-10-01 NOTE — Assessment & Plan Note (Signed)
Continue Flonase.  Treat reflux.  Follow.

## 2013-10-16 ENCOUNTER — Ambulatory Visit: Payer: Self-pay | Admitting: Internal Medicine

## 2013-10-24 ENCOUNTER — Encounter: Payer: Self-pay | Admitting: *Deleted

## 2013-10-31 ENCOUNTER — Encounter: Payer: Self-pay | Admitting: Internal Medicine

## 2013-11-01 ENCOUNTER — Ambulatory Visit: Payer: Self-pay | Admitting: Internal Medicine

## 2013-11-07 ENCOUNTER — Encounter: Payer: Self-pay | Admitting: *Deleted

## 2013-11-13 ENCOUNTER — Encounter: Payer: Self-pay | Admitting: Internal Medicine

## 2013-11-27 ENCOUNTER — Ambulatory Visit (INDEPENDENT_AMBULATORY_CARE_PROVIDER_SITE_OTHER): Payer: Medicare Other | Admitting: *Deleted

## 2013-11-27 DIAGNOSIS — Z23 Encounter for immunization: Secondary | ICD-10-CM

## 2013-12-20 DIAGNOSIS — C801 Malignant (primary) neoplasm, unspecified: Secondary | ICD-10-CM

## 2013-12-20 HISTORY — PX: BREAST SURGERY: SHX581

## 2013-12-20 HISTORY — DX: Malignant (primary) neoplasm, unspecified: C80.1

## 2013-12-23 DIAGNOSIS — G4733 Obstructive sleep apnea (adult) (pediatric): Secondary | ICD-10-CM | POA: Insufficient documentation

## 2014-01-31 ENCOUNTER — Other Ambulatory Visit (INDEPENDENT_AMBULATORY_CARE_PROVIDER_SITE_OTHER): Payer: Medicare Other

## 2014-01-31 DIAGNOSIS — I10 Essential (primary) hypertension: Secondary | ICD-10-CM

## 2014-01-31 DIAGNOSIS — R5381 Other malaise: Secondary | ICD-10-CM

## 2014-01-31 DIAGNOSIS — R5383 Other fatigue: Secondary | ICD-10-CM

## 2014-01-31 DIAGNOSIS — E7800 Pure hypercholesterolemia, unspecified: Secondary | ICD-10-CM

## 2014-01-31 LAB — LIPID PANEL
CHOLESTEROL: 224 mg/dL — AB (ref 0–200)
HDL: 64.6 mg/dL (ref >39.00)
TRIGLYCERIDES: 113 mg/dL (ref 0.0–149.0)
Total CHOL/HDL Ratio: 3
VLDL: 22.6 mg/dL (ref 0.0–40.0)

## 2014-01-31 LAB — COMPREHENSIVE METABOLIC PANEL
ALK PHOS: 56 U/L (ref 39–117)
ALT: 23 U/L (ref 0–35)
AST: 18 U/L (ref 0–37)
Albumin: 3.8 g/dL (ref 3.5–5.2)
BILIRUBIN TOTAL: 0.7 mg/dL (ref 0.3–1.2)
BUN: 20 mg/dL (ref 6–23)
CHLORIDE: 104 meq/L (ref 96–112)
CO2: 28 mEq/L (ref 19–32)
CREATININE: 1 mg/dL (ref 0.4–1.2)
Calcium: 9.5 mg/dL (ref 8.4–10.5)
GFR: 56.94 mL/min — ABNORMAL LOW (ref >60.00)
Glucose, Bld: 99 mg/dL (ref 70–99)
Potassium: 3.7 mEq/L (ref 3.5–5.1)
Sodium: 139 mEq/L (ref 135–145)
Total Protein: 6.7 g/dL (ref 6.0–8.3)

## 2014-01-31 LAB — CBC WITH DIFFERENTIAL/PLATELET
BASOS PCT: 0.8 % (ref 0.0–3.0)
Basophils Absolute: 0 10*3/uL (ref 0.0–0.1)
EOS ABS: 0.2 10*3/uL (ref 0.0–0.7)
Eosinophils Relative: 4.4 % (ref 0.0–5.0)
HCT: 41.6 % (ref 36.0–46.0)
HEMOGLOBIN: 13.8 g/dL (ref 12.0–15.0)
Lymphocytes Relative: 27.7 % (ref 12.0–46.0)
Lymphs Abs: 1.3 10*3/uL (ref 0.7–4.0)
MCHC: 33.1 g/dL (ref 30.0–36.0)
MCV: 92.3 fl (ref 78.0–100.0)
MONO ABS: 0.5 10*3/uL (ref 0.1–1.0)
Monocytes Relative: 10.8 % (ref 3.0–12.0)
NEUTROS ABS: 2.6 10*3/uL (ref 1.4–7.7)
NEUTROS PCT: 56.3 % (ref 43.0–77.0)
Platelets: 244 10*3/uL (ref 150.0–400.0)
RBC: 4.51 Mil/uL (ref 3.87–5.11)
RDW: 13.8 % (ref 11.5–14.6)
WBC: 4.6 10*3/uL (ref 4.5–10.5)

## 2014-01-31 LAB — TSH: TSH: 3.05 u[IU]/mL (ref 0.35–5.50)

## 2014-02-01 LAB — LDL CHOLESTEROL, DIRECT: LDL DIRECT: 146.9 mg/dL

## 2014-02-04 ENCOUNTER — Telehealth: Payer: Self-pay | Admitting: Internal Medicine

## 2014-02-04 NOTE — Telephone Encounter (Signed)
Pt called to rs appt 2/17 due to weather.  No available 30 min slots for many weeks.  Pt left appt 2/17 for now, will come if roads are clear.  Please advise when pt can be seen if rescheduled due to weather.

## 2014-02-05 ENCOUNTER — Ambulatory Visit: Payer: Medicare Other | Admitting: Internal Medicine

## 2014-02-05 ENCOUNTER — Encounter: Payer: Self-pay | Admitting: Internal Medicine

## 2014-02-05 NOTE — Telephone Encounter (Signed)
Can schedule her for 11:45 on 02/18/14 and block the 10:15 slot.   Thanks.

## 2014-02-06 NOTE — Telephone Encounter (Signed)
Appointment made sent pt my chart message letting her know about appointment

## 2014-02-07 NOTE — Telephone Encounter (Signed)
Mailed unread MyChart message to pt  

## 2014-02-18 ENCOUNTER — Encounter: Payer: Self-pay | Admitting: Internal Medicine

## 2014-02-18 ENCOUNTER — Ambulatory Visit (INDEPENDENT_AMBULATORY_CARE_PROVIDER_SITE_OTHER): Payer: Medicare Other | Admitting: Internal Medicine

## 2014-02-18 VITALS — BP 120/78 | HR 71 | Temp 98.1°F | Ht 59.5 in | Wt 146.0 lb

## 2014-02-18 DIAGNOSIS — Z1239 Encounter for other screening for malignant neoplasm of breast: Secondary | ICD-10-CM

## 2014-02-18 DIAGNOSIS — K219 Gastro-esophageal reflux disease without esophagitis: Secondary | ICD-10-CM

## 2014-02-18 DIAGNOSIS — G4733 Obstructive sleep apnea (adult) (pediatric): Secondary | ICD-10-CM

## 2014-02-18 DIAGNOSIS — J309 Allergic rhinitis, unspecified: Secondary | ICD-10-CM

## 2014-02-18 DIAGNOSIS — I10 Essential (primary) hypertension: Secondary | ICD-10-CM

## 2014-02-18 DIAGNOSIS — E7800 Pure hypercholesterolemia, unspecified: Secondary | ICD-10-CM

## 2014-02-18 DIAGNOSIS — R079 Chest pain, unspecified: Secondary | ICD-10-CM

## 2014-02-18 NOTE — Progress Notes (Signed)
Pre-visit discussion using our clinic review tool. No additional management support is needed unless otherwise documented below in the visit note.  

## 2014-02-18 NOTE — Progress Notes (Signed)
Subjective:    Patient ID: Carla Ryan, female    DOB: [birth year], 68 y.o.   MRN: [id number]  HPI 68 year old female with past history of hypertension and hypercholesterolemia who comes in today for a scheduled follow up.   She states that starting after Christmas 2013 she noticed some left breast drainage.   Was evaluated at [facility] and had mammogram and ultrasound 01/03/12.  Test clear.  Saw Dr Sabra Heck.  Had a culture and labs (regarding the discharge) and these were negative.  Stopped her HRT.  Was referred to Dr Tamala Julian.  Had a needle biopsy in the office. States it was ok.  She desired a second opinion.  Saw Dr Stasia Cavalier.  Had biopsy 01/15/13 and subsequently underwent a needle local excision of the area and the pathology returned benign - intraductal papilloma.  She has done well.  No further drainage.  She also saw Dr Gustavo Lah.  Was placed on Dexilant.  This has helped the reflux symptoms.  She has noticed recently some problems with coughing up food.  Some burning.  No dysphagia.  Some chest discomfort.  Notices after eating.  She has noticed some sob occasionally.  Stools more formed.  Wearing her CPAP.            Past Medical History  Diagnosis Date  . Hypertension   . GERD (gastroesophageal reflux disease)   . Hypercholesterolemia   . Osteoarthritis     scoliosis, degenerative disc dz, knee, carpal tunnel  . Anxiety     situational stress and anxiety     Current Outpatient Prescriptions on File Prior to Visit  Medication Sig Dispense Refill  . aspirin 81 MG tablet Take 81 mg by mouth daily.      . Biotin 5000 MCG TABS Take 1 tablet by mouth daily.      . Calcium Carbonate-Vitamin D (CALCIUM 600 + D PO) Take 600 mg by mouth 2 (two) times daily.      . cetirizine (ZYRTEC) 10 MG tablet Take 10 mg by mouth as needed for allergies.      . cholecalciferol (VITAMIN D) 1000 UNITS tablet Take 2,000 Units by mouth daily.       Marland Kitchen CINNAMON PO Take by mouth 2 (two) times daily.      Marland Kitchen  dexlansoprazole (DEXILANT) 60 MG capsule Take 60 mg by mouth daily.      . fexofenadine (ALLEGRA) 180 MG tablet Take 180 mg by mouth as needed.       . fish oil-omega-3 fatty acids 1000 MG capsule Take 2 g by mouth daily.       . fluticasone (FLONASE) 50 MCG/ACT nasal spray Place 2 sprays into the nose daily.  48 g  3  . Multiple Vitamin (MULTIVITAMIN) tablet Take 1 tablet by mouth daily.      Marland Kitchen triamterene-hydrochlorothiazide (DYAZIDE) 37.5-25 MG per capsule Take by mouth daily. Take one TABLET daily       No current facility-administered medications on file prior to visit.    Review of Systems Patient denies any headache, lightheadedness or dizziness.  No significant sinus symptoms.  No palpitations.  Some chest discomfort as outlined.  No increased cough or congestion. No nausea or vomiting. Acid reflux better on dexilant.  No dysphagia or odynophagia.  Does report coughing up food.  No abdominal pain or cramping.  No bowel change, such as diarrhea, constipation, BRBPR or melana.  No urine change.  Wearing CPAP.  Increased stress.  Objective:   Physical Exam  Filed Vitals:   02/18/14 1148  BP: 120/78  Pulse: 71  Temp: 98.1 F (57.63 C)   68 year old female in no acute distress.   HEENT:  Nares- clear.  Oropharynx - without lesions. NECK:  Supple.  Nontender.  No audible bruit.  HEART:  Appears to be regular. LUNGS:  No crackles or wheezing audible.  Respirations even and unlabored.  RADIAL PULSE:  Equal bilaterally.  ABDOMEN:  Soft, nontender.  Bowel sounds present and normal.  No audible abdominal bruit.  EXTREMITIES:  No increased edema present.  DP pulses palpable and equal bilaterally.          Assessment & Plan:  NIPPLE DISCHARGE.  Saw Dr Stasia Cavalier.  S/p excision - intraductal papilloma - benign.  Discharge resolved.  Doing well.  Mammogram 03/14/13 - Birads II.    HEALTH MAINTENANCE.   Is s/p hysterectomy.  Colonoscopy 2011 - negative.  Mammogram as outlined.

## 2014-02-21 ENCOUNTER — Encounter: Payer: Self-pay | Admitting: Emergency Medicine

## 2014-02-21 ENCOUNTER — Encounter: Payer: Self-pay | Admitting: Internal Medicine

## 2014-02-21 DIAGNOSIS — R059 Cough, unspecified: Secondary | ICD-10-CM

## 2014-02-21 DIAGNOSIS — R05 Cough: Secondary | ICD-10-CM

## 2014-02-21 DIAGNOSIS — R1310 Dysphagia, unspecified: Secondary | ICD-10-CM

## 2014-02-21 NOTE — Assessment & Plan Note (Signed)
EGD 2011 revealed erosive gastritis.  Saw Dr Gustavo Lah.  On Dexilant now. Taking in the am.  Will start zantac regularly in the pm.  Has had the issues with coughing up food.  Due to see Dr Gustavo Lah this week.  Follow.

## 2014-02-21 NOTE — Assessment & Plan Note (Signed)
Notices some chest discomfort after eating.  Some pain at other times as well.  EKG obtained and revealed SR with no acute ischemic changes.  Discomfort may be related to a GI origin, but after discussion with her - will refer her to cardiology for further evaluation with question of need for further cardiac w/up.

## 2014-02-21 NOTE — Assessment & Plan Note (Signed)
Low cholesterol diet and exercise.  Recent cholesterol panel revealed total cholesterol 224, triglycerides 113, HDL 65 and LDL 147.   Have discussed my desire to start medication.  She declines.  Follow lipid panel.

## 2014-02-21 NOTE — Assessment & Plan Note (Signed)
Blood pressure as outlined.  Continue same meds.  Follow metabolic panel.  Follow pressures.    

## 2014-02-21 NOTE — Assessment & Plan Note (Signed)
Using CPAP.  Follow.  

## 2014-02-21 NOTE — Assessment & Plan Note (Signed)
Continue Flonase.  Continue reflux treatment.

## 2014-02-27 ENCOUNTER — Emergency Department: Payer: Self-pay | Admitting: Emergency Medicine

## 2014-02-27 LAB — BASIC METABOLIC PANEL
Anion Gap: 8 (ref 7–16)
BUN: 16 mg/dL (ref 7–18)
CALCIUM: 9.2 mg/dL (ref 8.5–10.1)
Chloride: 104 mmol/L (ref 98–107)
Co2: 27 mmol/L (ref 21–32)
Creatinine: 0.98 mg/dL (ref 0.60–1.30)
EGFR (African American): >60
EGFR (Non-African Amer.): 58 — ABNORMAL LOW
Glucose: 139 mg/dL — ABNORMAL HIGH (ref 65–99)
Osmolality: 281 (ref 275–301)
Potassium: 3.7 mmol/L (ref 3.5–5.1)
SODIUM: 139 mmol/L (ref 136–145)

## 2014-02-27 LAB — CBC
HCT: 39.5 % (ref 35.0–47.0)
HGB: 13.7 g/dL (ref 12.0–16.0)
MCH: 31.4 pg (ref 26.0–34.0)
MCHC: 34.7 g/dL (ref 32.0–36.0)
MCV: 91 fL (ref 80–100)
PLATELETS: 218 10*3/uL (ref 150–440)
RBC: 4.35 10*6/uL (ref 3.80–5.20)
RDW: 13.8 % (ref 11.5–14.5)
WBC: 6.1 10*3/uL (ref 3.6–11.0)

## 2014-02-27 LAB — TROPONIN I: Troponin-I: <0.02 ng/mL

## 2014-02-27 LAB — LIPASE, BLOOD: Lipase: 172 U/L (ref 73–393)

## 2014-03-08 NOTE — Telephone Encounter (Signed)
I placed an order for the referral.  See my comment on the referral.  She has seen Dr Gustavo Lah previusly.

## 2014-03-08 NOTE — Telephone Encounter (Signed)
Dr. Nicki Reaper, please advise as I do not have a referral for this pt

## 2014-03-25 ENCOUNTER — Ambulatory Visit: Payer: Self-pay | Admitting: Internal Medicine

## 2014-03-25 LAB — HM MAMMOGRAPHY

## 2014-03-26 ENCOUNTER — Encounter: Payer: Self-pay | Admitting: Internal Medicine

## 2014-04-03 ENCOUNTER — Encounter: Payer: Self-pay | Admitting: Internal Medicine

## 2014-04-03 ENCOUNTER — Ambulatory Visit (INDEPENDENT_AMBULATORY_CARE_PROVIDER_SITE_OTHER): Payer: Medicare Other | Admitting: Internal Medicine

## 2014-04-03 VITALS — BP 130/90 | HR 91 | Temp 98.2°F | Ht 59.5 in | Wt 142.0 lb

## 2014-04-03 DIAGNOSIS — G4733 Obstructive sleep apnea (adult) (pediatric): Secondary | ICD-10-CM

## 2014-04-03 DIAGNOSIS — K219 Gastro-esophageal reflux disease without esophagitis: Secondary | ICD-10-CM

## 2014-04-03 DIAGNOSIS — J309 Allergic rhinitis, unspecified: Secondary | ICD-10-CM

## 2014-04-03 DIAGNOSIS — R079 Chest pain, unspecified: Secondary | ICD-10-CM

## 2014-04-03 DIAGNOSIS — E7800 Pure hypercholesterolemia, unspecified: Secondary | ICD-10-CM

## 2014-04-03 DIAGNOSIS — I10 Essential (primary) hypertension: Secondary | ICD-10-CM

## 2014-04-03 NOTE — Progress Notes (Signed)
Pre visit review using our clinic review tool, if applicable. No additional management support is needed unless otherwise documented below in the visit note. 

## 2014-04-03 NOTE — Progress Notes (Signed)
Subjective:    Patient ID: Carla Ryan, female    DOB: [birth year], 68 y.o.   MRN: [id number]  HPI 68 year old female with past history of hypertension and hypercholesterolemia who comes in today for a scheduled follow up.   She states that starting after Christmas 2013 she noticed some left breast drainage.   Was evaluated at [facility] and had mammogram and ultrasound 01/03/12.  Test clear.  Saw Dr Sabra Heck.  Had a culture and labs (regarding the discharge) and these were negative.  Stopped her HRT.  Was referred to Dr Tamala Julian.  Had a needle biopsy in the office. States it was ok.  She desired a second opinion.  Saw Dr Stasia Cavalier.  Had biopsy 01/15/13 and subsequently underwent a needle local excision of the area and the pathology returned benign - intraductal papilloma.  She has done well.  No further drainage.  She also saw Dr Gustavo Lah.  Was placed on Dexilant.  This has helped the reflux symptoms.   Was previously having chest pain.  Saw Dr Saralyn Pilar.  Had stress echo that was negative for ischemia.   She then saw GI.  She has decreased her coffee intake.  Has noticed decreased burping.  No sob.  Has stopped her fish oil.  She has f/u with GI tomorrow.  She is tolerating niacin.  Planning to f/u tomorrow for additional views (mammogram).           Past Medical History  Diagnosis Date  . Hypertension   . GERD (gastroesophageal reflux disease)   . Hypercholesterolemia   . Osteoarthritis     scoliosis, degenerative disc dz, knee, carpal tunnel  . Anxiety     situational stress and anxiety     Current Outpatient Prescriptions on File Prior to Visit  Medication Sig Dispense Refill  . aspirin 81 MG tablet Take 81 mg by mouth daily.      Marland Kitchen azelastine (ASTELIN) 137 MCG/SPRAY nasal spray Place 2 sprays into both nostrils 2 (two) times daily. Use in each nostril as directed      . Biotin 5000 MCG TABS Take 1 tablet by mouth daily.      . Calcium Carbonate-Vitamin D (CALCIUM 600 + D PO) Take 600 mg  by mouth 2 (two) times daily.      . cetirizine (ZYRTEC) 10 MG tablet Take 10 mg by mouth as needed for allergies.      . cholecalciferol (VITAMIN D) 1000 UNITS tablet Take 2,000 Units by mouth daily.       Marland Kitchen CINNAMON PO Take by mouth 2 (two) times daily.      Marland Kitchen dexlansoprazole (DEXILANT) 60 MG capsule Take 60 mg by mouth daily.      . fexofenadine (ALLEGRA) 180 MG tablet Take 180 mg by mouth as needed.       . fish oil-omega-3 fatty acids 1000 MG capsule Take 2 g by mouth daily.       . fluticasone (FLONASE) 50 MCG/ACT nasal spray Place 2 sprays into the nose daily.  48 g  3  . Multiple Vitamin (MULTIVITAMIN) tablet Take 1 tablet by mouth daily.      Marland Kitchen triamterene-hydrochlorothiazide (DYAZIDE) 37.5-25 MG per capsule Take by mouth daily. Take one TABLET daily       No current facility-administered medications on file prior to visit.    Review of Systems Patient denies any headache, lightheadedness or dizziness.  No significant sinus symptoms.  No palpitations.  Some chest discomfort as outlined.  No increased cough or congestion. No nausea or vomiting. Acid reflux better on dexilant.  No dysphagia or odynophagia.  Does report coughing up food.  No abdominal pain or cramping.  No bowel change, such as diarrhea, constipation, BRBPR or melana.  No urine change.  Wearing CPAP.  Increased stress.         Objective:   Physical Exam  Filed Vitals:   04/03/14 1221  BP: 130/90  Pulse: 91  Temp: 98.2 F (36.8 C)   Blood pressure recheck:  34/66  68 year old female in no acute distress.   HEENT:  Nares- clear.  Oropharynx - without lesions. NECK:  Supple.  Nontender.  No audible bruit.  HEART:  Appears to be regular. LUNGS:  No crackles or wheezing audible.  Respirations even and unlabored.  RADIAL PULSE:  Equal bilaterally.  ABDOMEN:  Soft, nontender.  Bowel sounds present and normal.  No audible abdominal bruit.  EXTREMITIES:  No increased edema present.  DP pulses palpable and equal  bilaterally.          Assessment & Plan:  NIPPLE DISCHARGE.  Saw Dr Stasia Cavalier.  S/p excision - intraductal papilloma - benign.  Discharge resolved.  Doing well.  Mammogram 03/14/13 - Birads II.  Just had f/u mammogram.  Recommended additional views.  These are scheduled for tomorrow.    HEALTH MAINTENANCE.   Is s/p hysterectomy.  Colonoscopy 2011 - negative.  Mammogram as outlined.

## 2014-04-03 NOTE — Assessment & Plan Note (Signed)
Blood pressure as outlined.  Continue same meds.  Follow metabolic panel.  Follow pressures.    

## 2014-04-04 ENCOUNTER — Ambulatory Visit: Payer: Self-pay | Admitting: Internal Medicine

## 2014-04-04 ENCOUNTER — Encounter: Payer: Self-pay | Admitting: Internal Medicine

## 2014-04-04 ENCOUNTER — Telehealth: Payer: Self-pay | Admitting: Emergency Medicine

## 2014-04-04 LAB — HM MAMMOGRAPHY

## 2014-04-04 NOTE — Telephone Encounter (Signed)
Patients mammo has came back abnormal. Patient has a mass in the right breast. Estill Bamberg from [facility] wondering if they should s/u biopsy or will we send her to a surgeon?

## 2014-04-04 NOTE — Telephone Encounter (Signed)
If they will send a report, I will refer her to a Psychologist, sport and exercise.

## 2014-04-04 NOTE — Telephone Encounter (Signed)
Pt also sent a mychart message stating that she would like to go to [facility]

## 2014-04-04 NOTE — Telephone Encounter (Signed)
Amanda aware. Report should be here soon.

## 2014-04-04 NOTE — Telephone Encounter (Signed)
See Below, Estill Bamberg aware

## 2014-04-05 ENCOUNTER — Telehealth: Payer: Self-pay | Admitting: Internal Medicine

## 2014-04-05 DIAGNOSIS — R928 Other abnormal and inconclusive findings on diagnostic imaging of breast: Secondary | ICD-10-CM

## 2014-04-05 NOTE — Telephone Encounter (Signed)
Spoke to pt about her mammogram.  She prefers to see Dr Stasia Cavalier ([facility]).  Has seen him previously.  Saw him last year.  Has category 5 mammo.  Needs appt asap.

## 2014-04-05 NOTE — Telephone Encounter (Signed)
Spoke to pt and discussed her category 5 mammo.  She prefers to see Dr Rushie Goltz.  Order placed for referral.

## 2014-04-07 ENCOUNTER — Encounter: Payer: Self-pay | Admitting: Internal Medicine

## 2014-04-07 NOTE — Assessment & Plan Note (Signed)
EGD 2011 revealed erosive gastritis.  Saw Dr Gustavo Lah.  On Dexilant.  Has adjusted her diet.  Decreased her coffee intake.  Now with decreased burping.  Due to f/u with GI tomorrow.

## 2014-04-07 NOTE — Assessment & Plan Note (Signed)
Continue Flonase.  Continue reflux treatment.

## 2014-04-07 NOTE — Assessment & Plan Note (Signed)
Using CPAP.  Follow.  

## 2014-04-07 NOTE — Assessment & Plan Note (Signed)
Previous chest pain.  Saw Dr Saralyn Pilar.  Had a negative stress test.  Felt to be more GI in origin.  No chest pain now.  Due to f/u with GI tomorrow.

## 2014-04-07 NOTE — Assessment & Plan Note (Signed)
Low cholesterol diet and exercise.  Have discussed my desire to start medication.  She has had intolerance to statins.  She declines.  Is tolerating niacin.  Will titrate.  Follow lipid panel.

## 2014-05-10 ENCOUNTER — Ambulatory Visit: Payer: Self-pay | Admitting: Gastroenterology

## 2014-05-10 HISTORY — PX: UPPER GI ENDOSCOPY: SHX6162

## 2014-05-14 ENCOUNTER — Telehealth: Payer: Self-pay | Admitting: Internal Medicine

## 2014-05-14 LAB — PATHOLOGY REPORT

## 2014-05-14 NOTE — Telephone Encounter (Signed)
Sent pt a my chart message regarding her abnormal mammogram and follow up.

## 2014-05-17 ENCOUNTER — Ambulatory Visit: Payer: Self-pay | Admitting: Gastroenterology

## 2014-05-21 ENCOUNTER — Encounter: Payer: Self-pay | Admitting: Internal Medicine

## 2014-05-21 DIAGNOSIS — K219 Gastro-esophageal reflux disease without esophagitis: Secondary | ICD-10-CM

## 2014-06-11 ENCOUNTER — Encounter: Payer: Self-pay | Admitting: Internal Medicine

## 2014-06-11 ENCOUNTER — Other Ambulatory Visit: Payer: Self-pay | Admitting: Internal Medicine

## 2014-06-11 DIAGNOSIS — R252 Cramp and spasm: Secondary | ICD-10-CM

## 2014-06-11 NOTE — Progress Notes (Signed)
Order placed for magnesium lab.  

## 2014-06-12 ENCOUNTER — Other Ambulatory Visit (INDEPENDENT_AMBULATORY_CARE_PROVIDER_SITE_OTHER): Payer: Medicare Other

## 2014-06-12 DIAGNOSIS — R252 Cramp and spasm: Secondary | ICD-10-CM

## 2014-06-12 DIAGNOSIS — E7800 Pure hypercholesterolemia, unspecified: Secondary | ICD-10-CM

## 2014-06-12 DIAGNOSIS — I10 Essential (primary) hypertension: Secondary | ICD-10-CM

## 2014-06-12 LAB — COMPREHENSIVE METABOLIC PANEL
ALK PHOS: 62 U/L (ref 39–117)
ALT: 17 U/L (ref 0–35)
AST: 16 U/L (ref 0–37)
Albumin: 4 g/dL (ref 3.5–5.2)
BUN: 20 mg/dL (ref 6–23)
CO2: 31 mEq/L (ref 19–32)
Calcium: 9.4 mg/dL (ref 8.4–10.5)
Chloride: 102 mEq/L (ref 96–112)
Creatinine, Ser: 0.8 mg/dL (ref 0.4–1.2)
GFR: 78.69 mL/min (ref >60.00)
Glucose, Bld: 102 mg/dL — ABNORMAL HIGH (ref 70–99)
POTASSIUM: 3.8 meq/L (ref 3.5–5.1)
Sodium: 139 mEq/L (ref 135–145)
Total Bilirubin: 0.6 mg/dL (ref 0.2–1.2)
Total Protein: 6.6 g/dL (ref 6.0–8.3)

## 2014-06-12 LAB — MAGNESIUM: Magnesium: 2 mg/dL (ref 1.5–2.5)

## 2014-06-12 LAB — LIPID PANEL
CHOLESTEROL: 237 mg/dL — AB (ref 0–200)
HDL: 60.4 mg/dL (ref >39.00)
LDL Cholesterol: 154 mg/dL — ABNORMAL HIGH (ref 0–99)
NONHDL: 176.6
Total CHOL/HDL Ratio: 4
Triglycerides: 115 mg/dL (ref 0.0–149.0)
VLDL: 23 mg/dL (ref 0.0–40.0)

## 2014-06-15 ENCOUNTER — Encounter: Payer: Self-pay | Admitting: Internal Medicine

## 2014-07-01 ENCOUNTER — Encounter: Payer: Self-pay | Admitting: Internal Medicine

## 2014-07-08 ENCOUNTER — Ambulatory Visit (INDEPENDENT_AMBULATORY_CARE_PROVIDER_SITE_OTHER): Payer: Medicare Other | Admitting: Internal Medicine

## 2014-07-08 ENCOUNTER — Encounter: Payer: Self-pay | Admitting: Internal Medicine

## 2014-07-08 VITALS — BP 130/68 | HR 75 | Temp 98.4°F | Ht 59.5 in | Wt 140.5 lb

## 2014-07-08 DIAGNOSIS — I10 Essential (primary) hypertension: Secondary | ICD-10-CM

## 2014-07-08 DIAGNOSIS — M25569 Pain in unspecified knee: Secondary | ICD-10-CM

## 2014-07-08 DIAGNOSIS — K219 Gastro-esophageal reflux disease without esophagitis: Secondary | ICD-10-CM

## 2014-07-08 DIAGNOSIS — C50919 Malignant neoplasm of unspecified site of unspecified female breast: Secondary | ICD-10-CM

## 2014-07-08 DIAGNOSIS — E7800 Pure hypercholesterolemia, unspecified: Secondary | ICD-10-CM

## 2014-07-08 DIAGNOSIS — M25561 Pain in right knee: Secondary | ICD-10-CM

## 2014-07-08 DIAGNOSIS — G4733 Obstructive sleep apnea (adult) (pediatric): Secondary | ICD-10-CM

## 2014-07-08 MED ORDER — TRIAMTERENE-HCTZ 37.5-25 MG PO CAPS: 1.0000 | ORAL_CAPSULE | Freq: Every day | ORAL | Status: DC

## 2014-07-08 NOTE — Progress Notes (Signed)
Pre visit review using our clinic review tool, if applicable. No additional management support is needed unless otherwise documented below in the visit note. 

## 2014-07-09 ENCOUNTER — Encounter: Payer: Self-pay | Admitting: Internal Medicine

## 2014-07-09 DIAGNOSIS — C50919 Malignant neoplasm of unspecified site of unspecified female breast: Secondary | ICD-10-CM | POA: Insufficient documentation

## 2014-07-09 DIAGNOSIS — M25561 Pain in right knee: Secondary | ICD-10-CM | POA: Insufficient documentation

## 2014-07-09 NOTE — Assessment & Plan Note (Signed)
Feels aggravated recently due to having to sleep in the recliner.  Better when she gets up and walks around.  Desires no further intervention at this time.  Follow.

## 2014-07-09 NOTE — Assessment & Plan Note (Signed)
Being followed at [facility].  Seeing Dr Stasia Cavalier and Dr Harden Mo.  Due to see Dr Harden Mo this week to discuss further treatment.

## 2014-07-09 NOTE — Assessment & Plan Note (Signed)
Blood pressure as outlined.  Continue same meds.  Follow metabolic panel.  Follow pressures.

## 2014-07-09 NOTE — Assessment & Plan Note (Signed)
Low cholesterol diet and exercise.  Have discussed my desire to start medication.  She has had intolerance to statins.  She declines.  Is back on niacin.  Will titrate.  Follow lipid panel.

## 2014-07-09 NOTE — Assessment & Plan Note (Signed)
Using CPAP.  Follow.  

## 2014-07-09 NOTE — Assessment & Plan Note (Signed)
On carafate now.  Due to f/u with GI next week.  Symptoms are better.  Bowels better.

## 2014-07-09 NOTE — Progress Notes (Signed)
Subjective:    Patient ID: Carla Ryan, female    DOB: 1946-04-12, 68 y.o.   MRN: [id number]  HPI 68 year old female with past history of hypertension and hypercholesterolemia who comes in today for a scheduled follow up.   She states that starting after Christmas 2013 she noticed some left breast drainage.   Was evaluated at [facility] and had mammogram and ultrasound 01/03/12.  Test clear.  Saw Dr Sabra Heck.  Had a culture and labs (regarding the discharge) and these were negative.  Stopped her HRT.  Was referred to Dr Tamala Julian.  Had a needle biopsy in the office. States it was ok.  She desired a second opinion.  Saw Dr Stasia Cavalier.  Had biopsy 01/15/13 and subsequently underwent a needle local excision of the area and the pathology returned benign - intraductal papilloma.  Most recent mammogram was abnormal.  She subsequently followed up with Dr Stasia Cavalier.  F/u biopsy revealed invasive ductal carcinoma.  She is s/p two recent breast surgeries. Last surgery with clear margins.  She is due to follow up with Dr Deitra Mayo (oncologist) this week - to discuss further treatment.   She also sees Dr Gustavo Lah.  Was placed on Dexilant.  This helped the reflux symptoms.   EGD 5/15 revealed bile gastritis and hiatal hernia.  Placed on carafate.  Is doing some better.  Due to follow up with Christianne next week.  Was previously having chest pain.   Also saw Dr Saralyn Pilar.  Had stress echo that was negative for ischemia.  From a cardiac standpoint, stable.  Overall she feels she is doing relatively well.   Coping relatively well.        Past Medical History  Diagnosis Date  . Hypertension   . GERD (gastroesophageal reflux disease)   . Hypercholesterolemia   . Osteoarthritis     scoliosis, degenerative disc dz, knee, carpal tunnel  . Anxiety     situational stress and anxiety     Current Outpatient Prescriptions on File Prior to Visit  Medication Sig Dispense Refill  . aspirin 81 MG tablet Take 81 mg  by mouth daily.      Marland Kitchen azelastine (ASTELIN) 137 MCG/SPRAY nasal spray Place 2 sprays into both nostrils 2 (two) times daily. Use in each nostril as directed      . Biotin 5000 MCG TABS Take 1 tablet by mouth daily.      . Calcium Carbonate-Vitamin D (CALCIUM 600 + D PO) Take 600 mg by mouth 2 (two) times daily.      . cetirizine (ZYRTEC) 10 MG tablet Take 10 mg by mouth as needed for allergies.      . cholecalciferol (VITAMIN D) 1000 UNITS tablet Take 2,000 Units by mouth daily.       Marland Kitchen CINNAMON PO Take by mouth 2 (two) times daily.      . fexofenadine (ALLEGRA) 180 MG tablet Take 180 mg by mouth as needed.       . fluticasone (FLONASE) 50 MCG/ACT nasal spray Place 2 sprays into the nose daily.  48 g  3  . methocarbamol (ROBAXIN) 500 MG tablet Take 500 mg by mouth 2 (two) times daily as needed for muscle spasms.      . Multiple Vitamin (MULTIVITAMIN) tablet Take 1 tablet by mouth daily.       No current facility-administered medications on file prior to visit.    Review of Systems Patient denies any headache, lightheadedness or dizziness.  No significant  sinus symptoms.  No palpitations.  No chest pain or tightness.   No increased cough or congestion.  No nausea or vomiting.  Acid reflux better on dexilant.  No dysphagia or odynophagia.  On carafate.  No abdominal pain or cramping.  No bowel change, such as diarrhea, constipation, BRBPR or melana.  No urine change.  Does report some right leg/knee discomfort.  Has occurred since sleeping in a recliner.   When she gets up and moves around, better.  No increased back or hip pain.  Stable.       Objective:   Physical Exam  Filed Vitals:   07/08/14 0906  BP: 130/68  Pulse: 75  Temp: 98.4 F (36.9 C)   Blood pressure recheck:  54/74  68 year old female in no acute distress.   HEENT:  Nares- clear.  Oropharynx - without lesions. NECK:  Supple.  Nontender.  No audible bruit.  HEART:  Appears to be regular. LUNGS:  No crackles or wheezing  audible.  Respirations even and unlabored.  RADIAL PULSE:  Equal bilaterally.  ABDOMEN:  Soft, nontender.  Bowel sounds present and normal.  No audible abdominal bruit.  EXTREMITIES:  No increased edema present.  DP pulses palpable and equal bilaterally.  MSK:  Negative straight leg raise.  No significant increased pain with flexion and full extension of knee.  No incresed swelling or erythema.           Assessment & Plan:  HEALTH MAINTENANCE.   Is s/p hysterectomy.  Colonoscopy 2011 - negative.  Mammogram as outlined.  Being followed by oncology.    I spent 25 minutes with the patient and more than 50% of the time was spent in consultation regarding the above.

## 2014-08-16 DIAGNOSIS — M419 Scoliosis, unspecified: Secondary | ICD-10-CM | POA: Insufficient documentation

## 2014-10-07 ENCOUNTER — Encounter: Payer: Self-pay | Admitting: Internal Medicine

## 2014-10-07 ENCOUNTER — Other Ambulatory Visit (INDEPENDENT_AMBULATORY_CARE_PROVIDER_SITE_OTHER): Payer: Medicare Other

## 2014-10-07 DIAGNOSIS — E7800 Pure hypercholesterolemia, unspecified: Secondary | ICD-10-CM

## 2014-10-07 DIAGNOSIS — I10 Essential (primary) hypertension: Secondary | ICD-10-CM

## 2014-10-07 DIAGNOSIS — C50919 Malignant neoplasm of unspecified site of unspecified female breast: Secondary | ICD-10-CM

## 2014-10-07 LAB — LIPID PANEL
CHOL/HDL RATIO: 5
Cholesterol: 240 mg/dL — ABNORMAL HIGH (ref 0–200)
HDL: 51 mg/dL (ref >39.00)
LDL Cholesterol: 160 mg/dL — ABNORMAL HIGH (ref 0–99)
NONHDL: 189
Triglycerides: 145 mg/dL (ref 0.0–149.0)
VLDL: 29 mg/dL (ref 0.0–40.0)

## 2014-10-07 LAB — HEPATIC FUNCTION PANEL
ALT: 24 U/L (ref 0–35)
AST: 23 U/L (ref 0–37)
Albumin: 3.5 g/dL (ref 3.5–5.2)
Alkaline Phosphatase: 60 U/L (ref 39–117)
BILIRUBIN TOTAL: 0.6 mg/dL (ref 0.2–1.2)
Bilirubin, Direct: 0.1 mg/dL (ref 0.0–0.3)
Total Protein: 7 g/dL (ref 6.0–8.3)

## 2014-10-07 LAB — BASIC METABOLIC PANEL
BUN: 19 mg/dL (ref 6–23)
CALCIUM: 9.4 mg/dL (ref 8.4–10.5)
CO2: 30 meq/L (ref 19–32)
CREATININE: 0.9 mg/dL (ref 0.4–1.2)
Chloride: 102 mEq/L (ref 96–112)
GFR: 66.52 mL/min (ref >60.00)
GLUCOSE: 106 mg/dL — AB (ref 70–99)
Potassium: 3.9 mEq/L (ref 3.5–5.1)
Sodium: 139 mEq/L (ref 135–145)

## 2014-10-08 ENCOUNTER — Ambulatory Visit (INDEPENDENT_AMBULATORY_CARE_PROVIDER_SITE_OTHER): Payer: Medicare Other | Admitting: Internal Medicine

## 2014-10-08 ENCOUNTER — Encounter: Payer: Self-pay | Admitting: *Deleted

## 2014-10-08 ENCOUNTER — Encounter: Payer: Self-pay | Admitting: Internal Medicine

## 2014-10-08 VITALS — BP 120/60 | HR 72 | Temp 97.8°F | Ht 59.9 in | Wt 145.5 lb

## 2014-10-08 DIAGNOSIS — Z23 Encounter for immunization: Secondary | ICD-10-CM

## 2014-10-08 DIAGNOSIS — E7800 Pure hypercholesterolemia, unspecified: Secondary | ICD-10-CM

## 2014-10-08 DIAGNOSIS — K469 Unspecified abdominal hernia without obstruction or gangrene: Secondary | ICD-10-CM | POA: Insufficient documentation

## 2014-10-08 DIAGNOSIS — K219 Gastro-esophageal reflux disease without esophagitis: Secondary | ICD-10-CM

## 2014-10-08 DIAGNOSIS — I10 Essential (primary) hypertension: Secondary | ICD-10-CM

## 2014-10-08 DIAGNOSIS — G4733 Obstructive sleep apnea (adult) (pediatric): Secondary | ICD-10-CM

## 2014-10-08 DIAGNOSIS — R739 Hyperglycemia, unspecified: Secondary | ICD-10-CM

## 2014-10-08 DIAGNOSIS — C50919 Malignant neoplasm of unspecified site of unspecified female breast: Secondary | ICD-10-CM

## 2014-10-08 DIAGNOSIS — M545 Low back pain: Secondary | ICD-10-CM

## 2014-10-08 DIAGNOSIS — K458 Other specified abdominal hernia without obstruction or gangrene: Secondary | ICD-10-CM

## 2014-10-08 NOTE — Progress Notes (Signed)
Pre visit review using our clinic review tool, if applicable. No additional management support is needed unless otherwise documented below in the visit note. 

## 2014-10-08 NOTE — Progress Notes (Signed)
Subjective:    Patient ID: Carla Ryan, female    DOB: Jul 22, 1946, 68 y.o.   MRN: [id number]  HPI 68 year old female with past history of hypertension and hypercholesterolemia who comes in today to follow up on these issues as well as for a complete physical exam.   Recently diagnosed with breast cancer.  Seeing Dr Stasia Cavalier.  She is s/p two recent breast surgeries. Last surgery with clear margins.  Seeing Dr Deitra Mayo (oncologist). No chemo.  On femara.   Is s/u XRT.  Increased fatigue.  Energy is getting better. Has f/u in November.  Receiving IV Zemeta every 6 months.  Due f/u mammogram in 12/2014.  She also sees Dr Gustavo Lah.  Was placed on Dexilant.  This helped the reflux symptoms.   EGD 5/15 revealed bile gastritis and hiatal hernia.  Placed on carafate.  Is doing better.    Was previously having chest pain.   Also saw Dr Saralyn Pilar.  Had stress echo that was negative for ischemia.  From a cardiac standpoint, stable.  Coping relatively well.  She is having some right lower abdominal pain.  Has a hernia.  Some increased pain with this.  Also still having pain in her back and leg.  Started physical therapy yesterday.  Slept better last night.  Has received two injections.  Continues to f/u with Dr Dwaine Gale.  Cholesterol elevated.  She declines starting a statin medication.      Past Medical History  Diagnosis Date  . Hypertension   . GERD (gastroesophageal reflux disease)   . Hypercholesterolemia   . Osteoarthritis     scoliosis, degenerative disc dz, knee, carpal tunnel  . Anxiety     situational stress and anxiety     Current Outpatient Prescriptions on File Prior to Visit  Medication Sig Dispense Refill  . aspirin 81 MG tablet Take 81 mg by mouth daily.      Marland Kitchen azelastine (ASTELIN) 137 MCG/SPRAY nasal spray Place 2 sprays into both nostrils 2 (two) times daily. Use in each nostril as directed      . Biotin 5000 MCG TABS Take 1 tablet by mouth daily.      . Calcium  Carbonate-Vitamin D (CALCIUM 600 + D PO) Take 600 mg by mouth 2 (two) times daily.      . cetirizine (ZYRTEC) 10 MG tablet Take 10 mg by mouth as needed for allergies.      . cholecalciferol (VITAMIN D) 1000 UNITS tablet Take 2,000 Units by mouth daily.       Marland Kitchen CINNAMON PO Take by mouth 2 (two) times daily.      . fexofenadine (ALLEGRA) 180 MG tablet Take 180 mg by mouth as needed.       . fluticasone (FLONASE) 50 MCG/ACT nasal spray Place 2 sprays into the nose daily.  48 g  3  . hyoscyamine (LEVSIN SL) 0.125 MG SL tablet Take 0.125 mg by mouth 3 (three) times daily.      . methocarbamol (ROBAXIN) 500 MG tablet Take 500 mg by mouth 2 (two) times daily as needed for muscle spasms.      . Multiple Vitamin (MULTIVITAMIN) tablet Take 1 tablet by mouth daily.      . niacin (NIASPAN) 1000 MG CR tablet Take 500 mg by mouth 3 (three) times daily.       . RABEprazole (ACIPHEX) 20 MG tablet Take 20 mg by mouth 2 (two) times daily.      . sucralfate (CARAFATE)  1 G tablet Take 1 g by mouth 2 (two) times daily. TAKE ONE TABLET BY MOUTH 2 TIMES DAILY BEFORE MEAL(S) AND NIGHTLY.      . triamterene-hydrochlorothiazide (DYAZIDE) 37.5-25 MG per capsule Take 1 each (1 capsule total) by mouth daily. Take one TABLET daily  30 capsule  4   No current facility-administered medications on file prior to visit.    Review of Systems Patient denies any headache, lightheadedness or dizziness.  No significant sinus symptoms.  No palpitations.  No chest pain or tightness.   No increased cough or congestion.  No nausea or vomiting.  Acid reflux better on dexilant.  No dysphagia or odynophagia.  On carafate.  No abdominal pain or cramping.  No bowel change, such as diarrhea, constipation, BRBPR or melana.  No urine change.  Persistent back and leg pain.  See above.  Increased fatigue.  Completed her XRT.  Energy is improving.  Taking niacin for her cholesterol.  Declines starting a statin.  Right lower abdominal pain as outlined.         Objective:   Physical Exam  Filed Vitals:   10/08/14 0831  BP: 120/60  Pulse: 72  Temp: 97.8 F (36.6 C)   Blood pressure recheck:  76/42  68 year old female in no acute distress.   HEENT:  Nares- clear.  Oropharynx - without lesions. NECK:  Supple.  Nontender.  No audible bruit.  HEART:  Appears to be regular. LUNGS:  No crackles or wheezing audible.  Respirations even and unlabored.  RADIAL PULSE:  Equal bilaterally.    BREASTS:  No nipple discharge or nipple retraction present.  Could not appreciate any distinct nodules or axillary adenopathy.  ABDOMEN:  Soft.  No significant tenderness to palpation.  Hernia more prominent with standing.   Bowel sounds present and normal.  No audible abdominal bruit.  GU:  Not performed.   EXTREMITIES:  No increased edema present.  DP pulses palpable and equal bilaterally.           Assessment & Plan:  HEALTH MAINTENANCE.   Physical today.  Is s/p hysterectomy.  Colonoscopy 2011 - negative.  Mammogram as outlined.  Being followed by oncology.  Scheduled f/u mammogram in 12/2014.     Problem List Items Addressed This Visit   Abdominal hernia     Exam as outlined.  Increased discomfort.  Refer to surgery for further evaluation and treatment management.       Relevant Orders      Ambulatory referral to General Surgery   Back pain     Seeing Dr Dwaine Gale.  S/p two injections.       Breast cancer     Being followed at [facility].  Seeing Dr Stasia Cavalier and Dr Harden Mo.  Due f/u mammogram 12/2014.         Relevant Medications      letrozole (FEMARA) 2.5 MG tablet   GERD (gastroesophageal reflux disease)     On carafate now.  Symptoms are better.  Bowels better.  EGD 05/10/14 - bile gastritis and hiatal hernia.        Hypercholesterolemia     Low cholesterol diet and exercise.  Have discussed my desire to start medication.  She has had intolerance to statins.  She declines.  Is taking niacin.  Follow lipid panel.       Hyperglycemia      Low carb diet.  Check fasting glucose and a1c.      Relevant Orders  Glucose, fasting      Hemoglobin A1c   Hypertension     Blood pressure as outlined.  Continue same meds.  Follow metabolic panel.  Follow pressures.       Obstructive sleep apnea     Using CPAP.  Follow.       Other Visit Diagnoses   Encounter for immunization    -  Primary      I spent 25 minutes with the patient and more than 50% of the time was spent in consultation regarding the above.

## 2014-10-13 ENCOUNTER — Encounter: Payer: Self-pay | Admitting: Internal Medicine

## 2014-10-13 DIAGNOSIS — M549 Dorsalgia, unspecified: Secondary | ICD-10-CM | POA: Insufficient documentation

## 2014-10-13 NOTE — Assessment & Plan Note (Signed)
Seeing Dr Dwaine Gale.  S/p two injections.

## 2014-10-13 NOTE — Assessment & Plan Note (Signed)
Blood pressure as outlined.  Continue same meds.  Follow metabolic panel.  Follow pressures.

## 2014-10-13 NOTE — Assessment & Plan Note (Signed)
Using CPAP.  Follow.  

## 2014-10-13 NOTE — Assessment & Plan Note (Signed)
Exam as outlined.  Increased discomfort.  Refer to surgery for further evaluation and treatment management.

## 2014-10-13 NOTE — Assessment & Plan Note (Signed)
Low cholesterol diet and exercise.  Have discussed my desire to start medication.  She has had intolerance to statins.  She declines.  Is taking niacin.  Follow lipid panel.

## 2014-10-13 NOTE — Assessment & Plan Note (Signed)
On carafate now.  Symptoms are better.  Bowels better.  EGD 05/10/14 - bile gastritis and hiatal hernia.

## 2014-10-13 NOTE — Assessment & Plan Note (Signed)
Low carb diet.  Check fasting glucose and a1c.   

## 2014-10-13 NOTE — Assessment & Plan Note (Signed)
Being followed at [facility].  Seeing Dr Stasia Cavalier and Dr Harden Mo.  Due f/u mammogram 12/2014.

## 2014-10-21 ENCOUNTER — Ambulatory Visit (INDEPENDENT_AMBULATORY_CARE_PROVIDER_SITE_OTHER): Payer: Medicare Other | Admitting: General Surgery

## 2014-10-21 ENCOUNTER — Encounter: Payer: Self-pay | Admitting: General Surgery

## 2014-10-21 ENCOUNTER — Ambulatory Visit: Payer: Self-pay | Admitting: General Surgery

## 2014-10-21 VITALS — BP 120/70 | HR 70 | Resp 12 | Ht 59.0 in | Wt 136.0 lb

## 2014-10-21 DIAGNOSIS — K469 Unspecified abdominal hernia without obstruction or gangrene: Secondary | ICD-10-CM

## 2014-10-21 NOTE — Patient Instructions (Signed)
Patient to return as needed. 

## 2014-10-21 NOTE — Progress Notes (Signed)
Patient ID: Carla Ryan, female   DOB: 06/05/1946, 68 y.o.   MRN: [id number]  Chief Complaint  Patient presents with  . Other    hernia    HPI Carla Ryan is a 68 y.o. female here today for a evaluation of abdominal hernia. Patient states it has been there for 12 years.   In the pass couple of week she has been having burning and pulling pressure pain in her right inguinal area.Intermittent, rarely interfering with activities.  Occasional stinging pain near the umbilical area.  HPI  Past Medical History  Diagnosis Date  . Hypertension   . GERD (gastroesophageal reflux disease)   . Hypercholesterolemia   . Osteoarthritis     scoliosis, degenerative disc dz, knee, carpal tunnel  . Anxiety     situational stress and anxiety  . Cancer 2015    breast     Past Surgical History  Procedure Laterality Date  . Appendectomy  2000  . Abdominal hysterectomy  07/2000  . Carpal tunnel release    . Breast biopsy Bilateral 2014  . Breast surgery Right 2015    lumpectomy  . Colonoscopy  2011    Family History  Problem Relation Age of Onset  . Diabetes Mother   . Diabetes Brother   . Diabetes Sister     x2  . Hypertension Brother   . Multiple myeloma Mother   . Multiple myeloma Father     Social History History  Substance Use Topics  . Smoking status: Never Smoker   . Smokeless tobacco: Never Used  . Alcohol Use: Yes     Comment: wine occasionally    No Known Allergies  Current Outpatient Prescriptions  Medication Sig Dispense Refill  . aspirin 81 MG tablet Take 81 mg by mouth daily.    Marland Kitchen azelastine (ASTELIN) 137 MCG/SPRAY nasal spray Place 2 sprays into both nostrils 2 (two) times daily. Use in each nostril as directed    . Biotin 5000 MCG TABS Take 1 tablet by mouth daily.    . Calcium Carbonate-Vitamin D (CALCIUM 600 + D PO) Take 600 mg by mouth 2 (two) times daily.    . cetirizine (ZYRTEC) 10 MG tablet Take 10 mg by mouth as needed for allergies.    .  cholecalciferol (VITAMIN D) 1000 UNITS tablet Take 2,000 Units by mouth daily.     Marland Kitchen CINNAMON PO Take by mouth 2 (two) times daily.    . fexofenadine (ALLEGRA) 180 MG tablet Take 180 mg by mouth as needed.     . fluticasone (FLONASE) 50 MCG/ACT nasal spray Place 2 sprays into the nose daily. 48 g 3  . hyoscyamine (LEVSIN SL) 0.125 MG SL tablet Take 0.125 mg by mouth 3 (three) times daily.    Marland Kitchen letrozole (FEMARA) 2.5 MG tablet Take 2.5 mg by mouth daily.    . methocarbamol (ROBAXIN) 500 MG tablet Take 500 mg by mouth 2 (two) times daily as needed for muscle spasms.    . Multiple Vitamin (MULTIVITAMIN) tablet Take 1 tablet by mouth daily.    . niacin (NIASPAN) 1000 MG CR tablet Take 500 mg by mouth 3 (three) times daily.     . RABEprazole (ACIPHEX) 20 MG tablet Take 20 mg by mouth 2 (two) times daily.    . sucralfate (CARAFATE) 1 G tablet Take 1 g by mouth 2 (two) times daily. TAKE ONE TABLET BY MOUTH 2 TIMES DAILY BEFORE MEAL(S) AND NIGHTLY.    . triamterene-hydrochlorothiazide (DYAZIDE)  37.5-25 MG per capsule Take 1 each (1 capsule total) by mouth daily. Take one TABLET daily 30 capsule 4   No current facility-administered medications for this visit.    Review of Systems Review of Systems  Constitutional: Negative.   Respiratory: Negative.   Cardiovascular: Negative.     Blood pressure 120/70, pulse 70, resp. rate 12, height _0  (1.499 m), weight 136 lb (61.689 kg).  Physical Exam Physical Exam  Constitutional: She is oriented to person, place, and time. She appears well-developed and well-nourished.  Eyes: Conjunctivae are normal. No scleral icterus.  Neck: Neck supple.  Cardiovascular: Normal rate, regular rhythm and normal heart sounds.   Pulmonary/Chest: Effort normal and breath sounds normal.  Abdominal: Soft. Normal appearance and bowel sounds are normal. There is no tenderness. A hernia (Right abdominal hernia) is present.    Lymphadenopathy:    She has no cervical  adenopathy.  Neurological: She is alert and oriented to person, place, and time.  Skin: Skin is warm and dry.    Data Reviewed PCP notes.   Assessment    Lateral abdominal wall hernia, presently with minimal symptoms.     Plan    Discussed abdominal hernia repair.She has noted at most a modest change is frequency of local discomfort, unclear if any change in size.  Repair would be proceeded by CT to confirm no other defects.  Ambivalent about surgery at this time. She will notify the office if she desires surgical intervention.     PCP:  Nash Shearer 10/22/2014, 9:33 PM

## 2014-10-22 DIAGNOSIS — K469 Unspecified abdominal hernia without obstruction or gangrene: Secondary | ICD-10-CM | POA: Insufficient documentation

## 2014-11-05 ENCOUNTER — Encounter: Payer: Self-pay | Admitting: Internal Medicine

## 2014-11-05 ENCOUNTER — Other Ambulatory Visit (INDEPENDENT_AMBULATORY_CARE_PROVIDER_SITE_OTHER): Payer: Medicare Other

## 2014-11-05 DIAGNOSIS — R739 Hyperglycemia, unspecified: Secondary | ICD-10-CM

## 2014-11-05 LAB — HEMOGLOBIN A1C: HEMOGLOBIN A1C: 6.1 % (ref 4.6–6.5)

## 2014-11-06 LAB — GLUCOSE, FASTING: Glucose, Fasting: 89 mg/dL (ref 70–99)

## 2014-11-25 ENCOUNTER — Other Ambulatory Visit: Payer: Self-pay | Admitting: *Deleted

## 2014-11-25 MED ORDER — TRIAMTERENE-HCTZ 37.5-25 MG PO CAPS: 1.0000 | ORAL_CAPSULE | Freq: Every day | ORAL | Status: DC

## 2014-11-25 NOTE — Telephone Encounter (Signed)
Pt requests 90 day supply

## 2014-11-28 ENCOUNTER — Other Ambulatory Visit: Payer: Self-pay | Admitting: *Deleted

## 2015-01-02 ENCOUNTER — Telehealth: Payer: Self-pay

## 2015-01-02 NOTE — Telephone Encounter (Signed)
Spoke with the patient about having her CT completed and she is amendable to this. Patient is scheduled for a CT of abdomen and pelvis with oral and IV contrast at [facility] location on 01/07/15 at 9:00 am. She will arrive there by 8:45 am and bring a list of medications with her. She needs to have no solid foods 4 hours prior but may have clear liquids. She will pick up a prep kit for this tomorrow. Patient is aware of date, time, and instructions.

## 2015-01-02 NOTE — Telephone Encounter (Signed)
-----   Message from Robert Bellow, MD sent at 01/02/2015  8:08 AM EST ----- Regarding: RE: Pre Op CT Please arrange for a CT of the abdomen and pelvis  Regarding right lateral abdominal wall hernia, IV and by mouth contrast. ----- Message -----    From: Lesly Rubenstein, LPN    Sent: 02/12/4974  11:09 AM      To: Robert Bellow, MD Subject: Pre Op CT                                      Carla Ryan called and says that she has been having more symptoms from her hernia and has decided that she would like to discuss having surgery. She said that you had wanted her to have a CT prior to surgery and she would like to start this process. She is scheduled to see you on 01/21/15 and would like to know if she can have her CT prior to this or if you need to see her first.

## 2015-01-09 ENCOUNTER — Telehealth: Payer: Self-pay | Admitting: *Deleted

## 2015-01-09 NOTE — Telephone Encounter (Signed)
Patient notified that CT scan has finally been approved (it was originally not approved for 01-07-15). She was instructed to call the scheduling department at [facility] to have this rescheduled for a convenient day and time. (Dr. Jamal Collin did a peer to peer discussion on this case since Dr. Bary Castilla was out of town. Approval # [id number]. Valid through 02-23-15.)  This patient will follow up in the office with Dr. Bary Castilla on 01-20-15 as previously scheduled.

## 2015-01-15 ENCOUNTER — Encounter: Payer: Self-pay | Admitting: General Surgery

## 2015-01-15 ENCOUNTER — Ambulatory Visit: Payer: Self-pay | Admitting: General Surgery

## 2015-01-20 ENCOUNTER — Ambulatory Visit (INDEPENDENT_AMBULATORY_CARE_PROVIDER_SITE_OTHER): Payer: Medicare Other | Admitting: General Surgery

## 2015-01-20 ENCOUNTER — Encounter: Payer: Self-pay | Admitting: General Surgery

## 2015-01-20 VITALS — BP 128/68 | HR 74 | Resp 12 | Ht 59.0 in | Wt 145.0 lb

## 2015-01-20 DIAGNOSIS — K449 Diaphragmatic hernia without obstruction or gangrene: Secondary | ICD-10-CM

## 2015-01-20 DIAGNOSIS — K469 Unspecified abdominal hernia without obstruction or gangrene: Secondary | ICD-10-CM

## 2015-01-20 DIAGNOSIS — K219 Gastro-esophageal reflux disease without esophagitis: Secondary | ICD-10-CM

## 2015-01-20 NOTE — Progress Notes (Signed)
Patient ID: Carla Ryan, female   DOB: 1946/05/28, 68 y.o.   MRN: [id number]  Chief Complaint  Patient presents with  . Pre-op Exam    abdominal hernia    HPI Carla Ryan is a 69 y.o. female here today for her pre op abdominal hernia. Fraser Din had a CT scan done on 01/15/15 Patient states she have been having acid reflex. The patient was seen in November 2015 Re: Discomfort in the right groin and a bulge on the right lateral abdominal wall. At that time her symptoms were not so significant as to warrant intervention. She called in the last few weeks reporting increasing discomfort in the right side. She subsequently underwent a CT scan of the abdomen and pelvis to evaluate the abdominal wall musculature.  The patient reports a discomfort at the site of the hernia on the right side of the lateral abdominal wall as well as some burning discomfort medial to this. No episodes of vomiting. No change in bowel habits.  Incidental finding on the CT was a small paraesophageal hernia. The patient has been followed by [name], [name] from the GI department in regards to symptomatic reflux. She reports that she is symptomatic in spite of the use of a PPI, but can become asymptomatic if she supplements this with Carafate. She denies any dysphasia. She is likely to have reflux when she is Sims the supine position or if she eats late in the day.   HPI  Past Medical History  Diagnosis Date  . Hypertension   . GERD (gastroesophageal reflux disease)   . Hypercholesterolemia   . Osteoarthritis     scoliosis, degenerative disc dz, knee, carpal tunnel  . Anxiety     situational stress and anxiety  . Cancer 2015    breast   . Abdominal hernia   . Hiatal hernia     Past Surgical History  Procedure Laterality Date  . Appendectomy  2000  . Abdominal hysterectomy  07/2000  . Carpal tunnel release    . Breast biopsy Bilateral 2014  . Breast surgery Right 2015    lumpectomy  . Colonoscopy  2011     [name], [name]: Normal exam  . Upper gi endoscopy  05/10/2014    [name], [name] Bile gastritis, medium hiatal hernia. Incomplete visualization of the cardia and fundus.    Family History  Problem Relation Age of Onset  . Diabetes Mother   . Diabetes Brother   . Diabetes Sister     x2  . Hypertension Brother   . Multiple myeloma Mother   . Multiple myeloma Father     Social History History  Substance Use Topics  . Smoking status: Never Smoker   . Smokeless tobacco: Never Used  . Alcohol Use: Yes     Comment: wine occasionally    No Known Allergies  Current Outpatient Prescriptions  Medication Sig Dispense Refill  . aspirin 81 MG tablet Take 81 mg by mouth daily.    Marland Kitchen azelastine (ASTELIN) 137 MCG/SPRAY nasal spray Place 2 sprays into both nostrils 2 (two) times daily. Use in each nostril as directed    . Biotin 5000 MCG TABS Take 1 tablet by mouth daily.    . Calcium Carbonate-Vitamin D (CALCIUM 600 + D PO) Take 600 mg by mouth 2 (two) times daily.    . cetirizine (ZYRTEC) 10 MG tablet Take 10 mg by mouth as needed for allergies.    . cholecalciferol (VITAMIN D) 1000 UNITS  tablet Take 2,000 Units by mouth daily.     Marland Kitchen CINNAMON PO Take by mouth 2 (two) times daily.    . fexofenadine (ALLEGRA) 180 MG tablet Take 180 mg by mouth as needed.     . fluticasone (FLONASE) 50 MCG/ACT nasal spray Place 2 sprays into the nose daily. 48 g 3  . gabapentin (NEURONTIN) 100 MG capsule Take 1 capsule by mouth.    . hyoscyamine (LEVSIN SL) 0.125 MG SL tablet Take 0.125 mg by mouth 3 (three) times daily.    Marland Kitchen letrozole (FEMARA) 2.5 MG tablet Take 2.5 mg by mouth daily.    . methocarbamol (ROBAXIN) 500 MG tablet Take 500 mg by mouth 2 (two) times daily as needed for muscle spasms.    . Multiple Vitamin (MULTIVITAMIN) tablet Take 1 tablet by mouth daily.    . niacin (NIASPAN) 1000 MG CR tablet Take 500 mg by mouth 3 (three) times daily.     . RABEprazole (ACIPHEX) 20 MG tablet  Take 20 mg by mouth 2 (two) times daily.    Marland Kitchen triamterene-hydrochlorothiazide (DYAZIDE) 37.5-25 MG per capsule Take 1 each (1 capsule total) by mouth daily. 90 capsule 1  . Zoledronic Acid (ZOMETA IV) Inject into the vein. Every 6 months    . sucralfate (CARAFATE) 1 G tablet Take 1 g by mouth 2 (two) times daily. TAKE ONE TABLET BY MOUTH 2 TIMES DAILY BEFORE MEAL(S) AND NIGHTLY.     No current facility-administered medications for this visit.    Review of Systems Review of Systems  Constitutional: Negative.   Respiratory: Negative.   Cardiovascular: Negative.     Blood pressure 128/68, pulse 74, resp. rate 12, height 4' 11"  (1.499 m), weight 145 lb (65.772 kg).  Physical Exam Physical Exam  Constitutional: She is oriented to person, place, and time. She appears well-developed and well-nourished.  Eyes: Conjunctivae are normal. No scleral icterus.  Neck: Neck supple.  Cardiovascular: Normal rate, regular rhythm and normal heart sounds.   Pulmonary/Chest: Effort normal and breath sounds normal.  Abdominal: Soft. Normal appearance and bowel sounds are normal. There is no hepatomegaly. There is no tenderness. A hernia ( right abdominal hernia) is present.    Lymphadenopathy:    She has no cervical adenopathy.  Neurological: She is alert and oriented to person, place, and time.  Skin: Skin is warm and dry.    Data Reviewed CT scan of the abdomen and pelvis dated 01/15/2015 was reviewed. A small parasol gel hernias noted. Initial report was no lateral abdominal wall hernia. After re-review there is consensus of a Richter's type hernia in the right lateral abdominal wall without evidence of obstruction.  Assessment    Symptomatic lateral abdominal wall hernia.  Small paraesophageal hernia with ongoing reflux symptoms.    Plan    The patient was informed of the paraesophageal hernia. This may be contributing to her incomplete response to a PPI alone. Consideration could be given  to elective repair (mandatory repair is no longer warranted). The patient is not interested in surgical intervention at this time.  The lateral abdominal wall hernia is now symptomatic enough to consider elective repair. This would be a laparoscopic procedure with placement of intraperitoneal mesh. The pros and cons of mesh placement were reviewed.  Patient's surgery has been scheduled for 02-10-15 at [facility]. It is okay for patient to continue 81 mg aspirin.      PCP:  Nash Shearer 01/21/2015, 3:24 PM

## 2015-01-20 NOTE — Patient Instructions (Addendum)
Hernia A hernia happens when an organ inside your body pushes out through a weak spot in your belly (abdominal) wall. Most hernias get worse over time. They can often be pushed back into place (reduced). Surgery may be needed to repair hernias that cannot be pushed into place. HOME CARE  Keep doing normal activities.  Avoid lifting more than 10 pounds (4.5 kilograms).  Cough gently and avoid straining. Over time, these things will:  Increase your hernia size.  Irritate your hernia.  Break down hernia repairs.  Stop smoking.  Do not wear anything tight over your hernia. Do not keep the hernia in with an outside bandage.  Eat food that is high in fiber (fruit, vegetables, whole grains).  Drink enough fluids to keep your pee (urine) clear or pale yellow.  Take medicines to make your poop soft (stool softeners) if you cannot poop (constipated). GET HELP RIGHT AWAY IF:   You have a fever.  You have belly pain that gets worse.  You feel sick to your stomach (nauseous) and throw up (vomit).  Your skin starts to bulge out.  Your hernia turns a different color, feels hard, or is tender.  You have increased pain or puffiness (swelling) around the hernia.  You poop more or less often.  Your poop does not look the way normally does.  You have watery poop (diarrhea).  You cannot push the hernia back in place by applying gentle pressure while lying down. MAKE SURE YOU:   Understand these instructions.  Will watch your condition.  Will get help right away if you are not doing well or get worse. Document Released: 05/26/2010 Document Revised: 02/28/2012 Document Reviewed: 05/26/2010 [facility] Patient Information 2015 [place], [place]. This information is not intended to replace advice given to you by your health care provider. Make sure you discuss any questions you have with your health care provider.  Patient's surgery has been scheduled for 02-10-15 at [facility]. It is okay for  patient to continue 81 mg aspirin.

## 2015-01-21 ENCOUNTER — Encounter: Payer: Self-pay | Admitting: General Surgery

## 2015-01-21 ENCOUNTER — Other Ambulatory Visit: Payer: Self-pay | Admitting: General Surgery

## 2015-01-21 DIAGNOSIS — K469 Unspecified abdominal hernia without obstruction or gangrene: Secondary | ICD-10-CM

## 2015-01-21 DIAGNOSIS — K219 Gastro-esophageal reflux disease without esophagitis: Secondary | ICD-10-CM | POA: Insufficient documentation

## 2015-01-21 DIAGNOSIS — K449 Diaphragmatic hernia without obstruction or gangrene: Secondary | ICD-10-CM | POA: Insufficient documentation

## 2015-01-30 ENCOUNTER — Ambulatory Visit: Payer: Self-pay | Admitting: General Surgery

## 2015-01-31 ENCOUNTER — Encounter: Payer: Self-pay | Admitting: Internal Medicine

## 2015-01-31 ENCOUNTER — Ambulatory Visit (INDEPENDENT_AMBULATORY_CARE_PROVIDER_SITE_OTHER): Payer: Medicare Other | Admitting: Internal Medicine

## 2015-01-31 VITALS — BP 120/70 | HR 73 | Temp 98.0°F | Ht 59.0 in | Wt 145.1 lb

## 2015-01-31 DIAGNOSIS — E7800 Pure hypercholesterolemia, unspecified: Secondary | ICD-10-CM

## 2015-01-31 DIAGNOSIS — K449 Diaphragmatic hernia without obstruction or gangrene: Secondary | ICD-10-CM

## 2015-01-31 DIAGNOSIS — R739 Hyperglycemia, unspecified: Secondary | ICD-10-CM

## 2015-01-31 DIAGNOSIS — K458 Other specified abdominal hernia without obstruction or gangrene: Secondary | ICD-10-CM

## 2015-01-31 DIAGNOSIS — C50919 Malignant neoplasm of unspecified site of unspecified female breast: Secondary | ICD-10-CM

## 2015-01-31 DIAGNOSIS — R0602 Shortness of breath: Secondary | ICD-10-CM

## 2015-01-31 DIAGNOSIS — G4733 Obstructive sleep apnea (adult) (pediatric): Secondary | ICD-10-CM

## 2015-01-31 DIAGNOSIS — Z0000 Encounter for general adult medical examination without abnormal findings: Secondary | ICD-10-CM

## 2015-01-31 DIAGNOSIS — K219 Gastro-esophageal reflux disease without esophagitis: Secondary | ICD-10-CM

## 2015-01-31 DIAGNOSIS — I10 Essential (primary) hypertension: Secondary | ICD-10-CM

## 2015-01-31 MED ORDER — AZELASTINE HCL 0.1 % NA SOLN: 2.0000 | Freq: Two times a day (BID) | NASAL | Status: DC

## 2015-01-31 MED ORDER — FLUTICASONE PROPIONATE 50 MCG/ACT NA SUSP: 2.0000 | Freq: Every day | NASAL | Status: DC

## 2015-01-31 MED ORDER — TRIAMTERENE-HCTZ 37.5-25 MG PO CAPS: 1.0000 | ORAL_CAPSULE | Freq: Every day | ORAL | Status: DC

## 2015-01-31 NOTE — Progress Notes (Signed)
Patient ID: Carla Ryan, female   DOB: 29-Aug-1946, 69 y.o.   MRN: [id number]   Subjective:    Patient ID: Carla Ryan, female    DOB: 05/29/1946, 69 y.o.   MRN: [id number]  HPI  Patient here for a scheduled follow up.  Has a abdominal hernia.  Planning for surgery 02/10/15.  Went for pre op yesterday.  Has noticed being more sob with going upstairs and walking up a hill.  She also notices getting more sob with exertion.  Has seen Dr Carla Ryan.  We discussed referral to Dr Carla Ryan for pre op evaluation.  Increased epigastric pain.  Some nausea.  Taking aciphex twice a day.  This has worsened since she last saw GI.  Was questioning how much is due to the hiatal hernia.  She has noticed some pain under her right rib.  No vomiting.  Bowels stable.     Past Medical History  Diagnosis Date  . Hypertension   . GERD (gastroesophageal reflux disease)   . Hypercholesterolemia   . Osteoarthritis     scoliosis, degenerative disc dz, knee, carpal tunnel  . Anxiety     situational stress and anxiety  . Cancer 2015    breast   . Abdominal hernia   . Hiatal hernia     Current Outpatient Prescriptions on File Prior to Visit  Medication Sig Dispense Refill  . aspirin 81 MG tablet Take 81 mg by mouth daily.    . Biotin 5000 MCG TABS Take 1 tablet by mouth daily.    . Calcium Carbonate-Vitamin D (CALCIUM 600 + D PO) Take 600 mg by mouth 2 (two) times daily.    . cetirizine (ZYRTEC) 10 MG tablet Take 10 mg by mouth as needed for allergies.    . cholecalciferol (VITAMIN D) 1000 UNITS tablet Take 2,000 Units by mouth daily.     Marland Kitchen CINNAMON PO Take by mouth 2 (two) times daily.    . fexofenadine (ALLEGRA) 180 MG tablet Take 180 mg by mouth as needed.     . gabapentin (NEURONTIN) 100 MG capsule Take 1 capsule by mouth.    . letrozole (FEMARA) 2.5 MG tablet Take 2.5 mg by mouth daily.    . methocarbamol (ROBAXIN) 500 MG tablet Take 500 mg by mouth 2 (two) times daily as needed for muscle spasms.      . Multiple Vitamin (MULTIVITAMIN) tablet Take 1 tablet by mouth daily.    . niacin (NIASPAN) 1000 MG CR tablet Take 500 mg by mouth 3 (three) times daily.     . RABEprazole (ACIPHEX) 20 MG tablet Take 20 mg by mouth 2 (two) times daily.    . Zoledronic Acid (ZOMETA IV) Inject into the vein. Every 6 months     No current facility-administered medications on file prior to visit.    Review of Systems  Constitutional: Positive for fatigue. Negative for fever and unexpected weight change.  HENT: Negative for congestion and sinus pressure.   Respiratory: Positive for shortness of breath (she reports noticing some sob with going upstairs and walking up a hill.  some sob with bending over as well.  ). Negative for cough and chest tightness.   Cardiovascular: Negative for chest pain, palpitations and leg swelling.  Gastrointestinal: Positive for nausea and abdominal pain (reports epigastric pain.  some nausea.  some pain under the right ribs.  ). Negative for vomiting, diarrhea and constipation.  Genitourinary: Negative for dysuria and frequency.  Musculoskeletal: Negative for back pain  and joint swelling.  Neurological: Negative for dizziness, light-headedness and headaches.       Objective:    Physical Exam  HENT:  Nose: Nose normal.  Mouth/Throat: Oropharynx is clear and moist.  Neck: Neck supple. No thyromegaly present.  Cardiovascular: Normal rate and regular rhythm.   Pulmonary/Chest: Breath sounds normal. No respiratory distress. She has no wheezes.  Abdominal: Soft. Bowel sounds are normal. There is no tenderness.  Musculoskeletal: She exhibits no edema or tenderness.  Lymphadenopathy:    She has no cervical adenopathy.    BP 120/70 mmHg  Pulse 73  Temp(Src) 98 F (36.7 C) (Oral)  Ht 4' 11"  (1.499 m)  Wt 145 lb 2 oz (65.828 kg)  BMI 29.30 kg/m2  SpO2 96% Wt Readings from Last 3 Encounters:  01/31/15 145 lb 2 oz (65.828 kg)  01/20/15 145 lb (65.772 kg)  10/21/14 136 lb  (61.689 kg)     Lab Results  Component Value Date   WBC 4.6 01/31/2014   HGB 13.8 01/31/2014   HCT 41.6 01/31/2014   PLT 244.0 01/31/2014   GLUCOSE 106* 10/07/2014   CHOL 240* 10/07/2014   TRIG 145.0 10/07/2014   HDL 51.00 10/07/2014   LDLDIRECT 146.9 01/31/2014   LDLCALC 160* 10/07/2014   ALT 24 10/07/2014   AST 23 10/07/2014   NA 139 10/07/2014   K 3.9 10/07/2014   CL 102 10/07/2014   CREATININE 0.9 10/07/2014   BUN 19 10/07/2014   CO2 30 10/07/2014   TSH 3.05 01/31/2014   HGBA1C 6.1 11/05/2014       Assessment & Plan:   Problem List Items Addressed This Visit    Abdominal hernia    Planning for surgery 02/10/15.  Refer to cardiology for evaluation of the sob - for pre op evaluation.        Breast cancer    Being followed at [facility].  Seeing Dr Carla Ryan and Dr Carla Ryan.  Had mammogram 12/26/2014 - ok.        GERD (gastroesophageal reflux disease)    Had EGD 05/10/14 - irregular Z-line, bile gastritis and hiatal hernia.  On aciphex twice a day.  Add zantac in the evening.  Will need referral back to GI for evaluation.  Need to get the sob evaluated first.        Health care maintenance    Physical 10/08/14.  Is s/p hysterectomy.  Mammogram 12/26/14 - ok.  Had her bone density last year at [facility].   Colonoscopy 2011 - negative.       Hypercholesterolemia    Low cholesterol diet and exercise.  On niacin.  Follow lipid panel.        Relevant Medications   triamterene-hydrochlorothiazide (DYAZIDE) 37.5-25 MG per capsule   Hyperglycemia    Low carb diet and exercise.  Follow met b and a1c.        Hypertension    Blood pressure as outlined.  Same medication regimen.  Follow pressures.  Follow met b.       Relevant Medications   triamterene-hydrochlorothiazide (DYAZIDE) 37.5-25 MG per capsule   Obstructive sleep apnea    Severe obstructive sleep apnea. Using CPAP.        Paraesophageal hiatal hernia    Unclear if this is contributing to the sob.  Pursue cardiac  w/up as outlined.        SOB (shortness of breath) - Primary    Just had EKG yesterday.  Obtain results.  Given symptoms and upcoming surgery,  will refer to cardiology for further evaluation (pre op evaluation).        Relevant Orders   Ambulatory referral to Cardiology     I spent 25 minutes with the patient and more than 50% of the time was spent in consultation regarding the above.     Einar Pheasant, MD

## 2015-01-31 NOTE — Progress Notes (Signed)
Pre visit review using our clinic review tool, if applicable. No additional management support is needed unless otherwise documented below in the visit note. 

## 2015-01-31 NOTE — Patient Instructions (Signed)
Ranitidine 150mg  before bed

## 2015-02-02 ENCOUNTER — Encounter: Payer: Self-pay | Admitting: Internal Medicine

## 2015-02-02 DIAGNOSIS — R0602 Shortness of breath: Secondary | ICD-10-CM | POA: Insufficient documentation

## 2015-02-02 DIAGNOSIS — Z0000 Encounter for general adult medical examination without abnormal findings: Secondary | ICD-10-CM | POA: Insufficient documentation

## 2015-02-02 NOTE — Assessment & Plan Note (Signed)
Low carb diet and exercise.  Follow met b and a1c.

## 2015-02-02 NOTE — Assessment & Plan Note (Signed)
Being followed at [facility].  Seeing Dr Stasia Cavalier and Dr Harden Mo.  Had mammogram 12/26/2014 - ok.

## 2015-02-02 NOTE — Assessment & Plan Note (Signed)
Low cholesterol diet and exercise.  On niacin.  Follow lipid panel.

## 2015-02-02 NOTE — Assessment & Plan Note (Signed)
Just had EKG yesterday.  Obtain results.  Given symptoms and upcoming surgery, will refer to cardiology for further evaluation (pre op evaluation).

## 2015-02-02 NOTE — Assessment & Plan Note (Signed)
Unclear if this is contributing to the sob.  Pursue cardiac w/up as outlined.

## 2015-02-02 NOTE — Assessment & Plan Note (Signed)
Planning for surgery 02/10/15.  Refer to cardiology for evaluation of the sob - for pre op evaluation.

## 2015-02-02 NOTE — Assessment & Plan Note (Signed)
Severe obstructive sleep apnea. Using CPAP.

## 2015-02-02 NOTE — Assessment & Plan Note (Signed)
Had EGD 05/10/14 - irregular Z-line, bile gastritis and hiatal hernia.  On aciphex twice a day.  Add zantac in the evening.  Will need referral back to GI for evaluation.  Need to get the sob evaluated first.

## 2015-02-02 NOTE — Assessment & Plan Note (Signed)
Blood pressure as outlined.  Same medication regimen.  Follow pressures.  Follow met b.

## 2015-02-02 NOTE — Assessment & Plan Note (Signed)
Physical 10/08/14.  Is s/p hysterectomy.  Mammogram 12/26/14 - ok.  Had her bone density last year at [facility].   Colonoscopy 2011 - negative.

## 2015-02-10 ENCOUNTER — Ambulatory Visit: Payer: Self-pay | Admitting: General Surgery

## 2015-02-10 DIAGNOSIS — K458 Other specified abdominal hernia without obstruction or gangrene: Secondary | ICD-10-CM

## 2015-02-10 HISTORY — PX: OTHER SURGICAL HISTORY: SHX169

## 2015-02-10 HISTORY — PX: HERNIA REPAIR: SHX51

## 2015-02-11 ENCOUNTER — Encounter: Payer: Self-pay | Admitting: General Surgery

## 2015-02-14 ENCOUNTER — Telehealth: Payer: Self-pay | Admitting: General Surgery

## 2015-02-14 NOTE — Telephone Encounter (Signed)
The patient called reports she had not had a bowel movement since repair of her lumbar hernia with laparoscopic technique on February 22. She had been making use of her Norco with good relief of her pain, markedly improved over the last 36 hours. She had made use of a trial of Colace and a dose of MiraLAX.  She was encouraged to make use of milk of magnesia 1 ounce today and in the morning if needed.

## 2015-02-17 ENCOUNTER — Encounter: Payer: Self-pay | Admitting: General Surgery

## 2015-02-17 ENCOUNTER — Ambulatory Visit (INDEPENDENT_AMBULATORY_CARE_PROVIDER_SITE_OTHER): Payer: Medicare Other | Admitting: General Surgery

## 2015-02-17 VITALS — BP 122/72 | HR 76 | Resp 14 | Ht 59.0 in | Wt 146.0 lb

## 2015-02-17 DIAGNOSIS — K469 Unspecified abdominal hernia without obstruction or gangrene: Secondary | ICD-10-CM

## 2015-02-17 NOTE — Patient Instructions (Signed)
Patient to return in one month. 

## 2015-02-17 NOTE — Progress Notes (Signed)
Patient ID: Carla Ryan, female   DOB: [birth year], 69 y.o.   MRN: [id number]  Chief Complaint  Patient presents with  . Routine Post Op    Laparoscopic Repair of Lateral Abdominal Wall Hernia    HPI Carla Ryan is a 69 y.o. female here today for her post op Laparoscopic repair of lateral abdominal wall hernia done on 02/10/15. Patient states she is doing well. She is scheduled to have a dental cleaning tomorrow. HPI  Past Medical History  Diagnosis Date  . Hypertension   . GERD (gastroesophageal reflux disease)   . Hypercholesterolemia   . Osteoarthritis     scoliosis, degenerative disc dz, knee, carpal tunnel  . Anxiety     situational stress and anxiety  . Cancer 2015    breast   . Abdominal hernia   . Hiatal hernia     Past Surgical History  Procedure Laterality Date  . Appendectomy  2000  . Abdominal hysterectomy  07/2000  . Carpal tunnel release    . Breast biopsy Bilateral 2014  . Breast surgery Right 2015    lumpectomy  . Colonoscopy  2011    [name], [name]: Normal exam  . Upper gi endoscopy  05/10/2014    [name], [name] Bile gastritis, medium hiatal hernia. Incomplete visualization of the cardia and fundus.  Marland Kitchen Hernia repair  02/10/15  . Laparoscopic repair of lateral abdominal wall hernia  02/10/15    Family History  Problem Relation Age of Onset  . Diabetes Mother   . Diabetes Brother   . Diabetes Sister     x2  . Hypertension Brother   . Multiple myeloma Mother   . Multiple myeloma Father     Social History History  Substance Use Topics  . Smoking status: Never Smoker   . Smokeless tobacco: Never Used  . Alcohol Use: 0.0 oz/week    0 Standard drinks or equivalent per week     Comment: wine occasionally    No Known Allergies  Current Outpatient Prescriptions  Medication Sig Dispense Refill  . aspirin 81 MG tablet Take 81 mg by mouth daily.    Marland Kitchen azelastine (ASTELIN) 0.1 % nasal spray Place 2 sprays into both nostrils 2  (two) times daily. Use in each nostril as directed 90 mL 3  . Biotin 5000 MCG TABS Take 1 tablet by mouth daily.    . Calcium Carbonate-Vitamin D (CALCIUM 600 + D PO) Take 600 mg by mouth 2 (two) times daily.    . cetirizine (ZYRTEC) 10 MG tablet Take 10 mg by mouth as needed for allergies.    . cholecalciferol (VITAMIN D) 1000 UNITS tablet Take 2,000 Units by mouth daily.     Marland Kitchen CINNAMON PO Take by mouth 2 (two) times daily.    . fexofenadine (ALLEGRA) 180 MG tablet Take 180 mg by mouth as needed.     . fluticasone (FLONASE) 50 MCG/ACT nasal spray Place 2 sprays into both nostrils daily. 48 g 3  . gabapentin (NEURONTIN) 100 MG capsule Take 1 capsule by mouth.    . letrozole (FEMARA) 2.5 MG tablet Take 2.5 mg by mouth daily.    . methocarbamol (ROBAXIN) 500 MG tablet Take 500 mg by mouth 2 (two) times daily as needed for muscle spasms.    . Multiple Vitamin (MULTIVITAMIN) tablet Take 1 tablet by mouth daily.    . niacin (NIASPAN) 1000 MG CR tablet Take 500 mg by mouth 3 (three) times daily.     Marland Kitchen  RABEprazole (ACIPHEX) 20 MG tablet Take 20 mg by mouth 2 (two) times daily.    Marland Kitchen triamterene-hydrochlorothiazide (DYAZIDE) 37.5-25 MG per capsule Take 1 each (1 capsule total) by mouth daily. 90 capsule 1  . Zoledronic Acid (ZOMETA IV) Inject into the vein. Every 6 months     No current facility-administered medications for this visit.    Review of Systems Review of Systems  Constitutional: Negative.   Respiratory: Negative.   Cardiovascular: Negative.     Blood pressure 122/72, pulse 76, resp. rate 14, height _0  (1.499 m), weight 146 lb (66.225 kg).  Physical Exam Physical Exam  Constitutional: She is oriented to person, place, and time. She appears well-developed and well-nourished.  Cardiovascular: Normal rate, regular rhythm and normal heart sounds.   Pulmonary/Chest: Effort normal and breath sounds normal.  Abdominal: Soft. Normal appearance and bowel sounds are normal.     Neurological: She is alert and oriented to person, place, and time.  Skin: Skin is warm and dry.  Port sites are clean and healing well.    Assessment    Doing well status post repair of lumbar hernia with intraperitoneal mesh placement.    Plan    In light of the recent placement of an intra-abdominal mesh, and the possible transient bacteremia related to dental hygiene cleansing, the patient has placed to make use of amoxicillin 2 g one hour prior to the procedure. Patient to return in one month.   Prior to her recent hernia surgery, we had a discussion regarding the previously identified paraesophageal hernia. In the preop interval she was not particularly interested in addressing a second surgical site. Her husband reports that she's been more symptomatic over the last 6 months in spite of medical therapy. We'll discuss in more detail a referral to a highly seasoned hiatal hernia specialist at her next appointment.     PCP:  Nash Shearer 02/18/2015, 9:31 AM

## 2015-03-19 ENCOUNTER — Encounter: Payer: Self-pay | Admitting: General Surgery

## 2015-03-19 ENCOUNTER — Ambulatory Visit (INDEPENDENT_AMBULATORY_CARE_PROVIDER_SITE_OTHER): Payer: Medicare Other | Admitting: General Surgery

## 2015-03-19 VITALS — BP 120/68 | HR 70 | Resp 14 | Ht 60.0 in | Wt 147.0 lb

## 2015-03-19 DIAGNOSIS — K469 Unspecified abdominal hernia without obstruction or gangrene: Secondary | ICD-10-CM

## 2015-03-19 NOTE — Patient Instructions (Signed)
Patient to return as needed. Proper lifting techniques reviewed. 

## 2015-03-19 NOTE — Progress Notes (Signed)
Patient ID: Carla Ryan, female   DOB: 03-07-1946, 69 y.o.   MRN: [id number]  Chief Complaint  Patient presents with  . Routine Post Op    abdomen wall hernia    HPI Carla Ryan is a 69 y.o. female here today for her post op Laparoscopic repair of lateral abdominal wall hernia done on 02/10/15. Patient states she is doing well HPI  Past Medical History  Diagnosis Date  . Hypertension   . GERD (gastroesophageal reflux disease)   . Hypercholesterolemia   . Osteoarthritis     scoliosis, degenerative disc dz, knee, carpal tunnel  . Anxiety     situational stress and anxiety  . Cancer 2015    breast   . Abdominal hernia   . Hiatal hernia     Past Surgical History  Procedure Laterality Date  . Appendectomy  2000  . Abdominal hysterectomy  07/2000  . Carpal tunnel release    . Breast biopsy Bilateral 2014  . Breast surgery Right 2015    lumpectomy  . Colonoscopy  2011    [name], [name]: Normal exam  . Upper gi endoscopy  05/10/2014    [name], [name] Bile gastritis, medium hiatal hernia. Incomplete visualization of the cardia and fundus.  Marland Kitchen Hernia repair  02/10/15  . Laparoscopic repair of lateral abdominal wall hernia  02/10/15    Family History  Problem Relation Age of Onset  . Diabetes Mother   . Diabetes Brother   . Diabetes Sister     x2  . Hypertension Brother   . Multiple myeloma Mother   . Multiple myeloma Father     Social History History  Substance Use Topics  . Smoking status: Never Smoker   . Smokeless tobacco: Never Used  . Alcohol Use: 0.0 oz/week    0 Standard drinks or equivalent per week     Comment: wine occasionally    No Known Allergies  Current Outpatient Prescriptions  Medication Sig Dispense Refill  . aspirin 81 MG tablet Take 81 mg by mouth daily.    Marland Kitchen azelastine (ASTELIN) 0.1 % nasal spray Place 2 sprays into both nostrils 2 (two) times daily. Use in each nostril as directed 90 mL 3  . Biotin 5000 MCG TABS Take 1  tablet by mouth daily.    . Calcium Carbonate-Vitamin D (CALCIUM 600 + D PO) Take 600 mg by mouth 2 (two) times daily.    . cetirizine (ZYRTEC) 10 MG tablet Take 10 mg by mouth as needed for allergies.    . cholecalciferol (VITAMIN D) 1000 UNITS tablet Take 2,000 Units by mouth daily.     Marland Kitchen CINNAMON PO Take by mouth 2 (two) times daily.    . fexofenadine (ALLEGRA) 180 MG tablet Take 180 mg by mouth as needed.     . fluticasone (FLONASE) 50 MCG/ACT nasal spray Place 2 sprays into both nostrils daily. 48 g 3  . gabapentin (NEURONTIN) 100 MG capsule Take 1 capsule by mouth.    . letrozole (FEMARA) 2.5 MG tablet Take 2.5 mg by mouth daily.    . methocarbamol (ROBAXIN) 500 MG tablet Take 500 mg by mouth 2 (two) times daily as needed for muscle spasms.    . Multiple Vitamin (MULTIVITAMIN) tablet Take 1 tablet by mouth daily.    . niacin (NIASPAN) 1000 MG CR tablet Take 500 mg by mouth 3 (three) times daily.     . RABEprazole (ACIPHEX) 20 MG tablet Take 20 mg by mouth  2 (two) times daily.    Marland Kitchen triamterene-hydrochlorothiazide (DYAZIDE) 37.5-25 MG per capsule Take 1 each (1 capsule total) by mouth daily. 90 capsule 1  . Zoledronic Acid (ZOMETA IV) Inject into the vein. Every 6 months     No current facility-administered medications for this visit.    Review of Systems Review of Systems  Constitutional: Negative.   Respiratory: Negative.   Cardiovascular: Negative.     Blood pressure 120/68, pulse 70, resp. rate 14, height 5' (1.524 m), weight 147 lb (66.679 kg).  Physical Exam Physical Exam  Constitutional: She is oriented to person, place, and time. She appears well-developed and well-nourished.  Abdominal: Soft. Normal appearance and bowel sounds are normal. There is no hepatomegaly. There is no tenderness.    Port sites well healed.   Neurological: She is alert and oriented to person, place, and time.  Skin: Skin is warm and dry.     Assessment    Well status post repair of  lateral abdominal wall hernia.    Plan    The patient was released to full activity. Good judgment with strenuous lifting was encouraged. Patient to return as needed.     PCP:  Nash Shearer 03/21/2015, 12:21 PM

## 2015-04-02 ENCOUNTER — Encounter: Payer: Self-pay | Admitting: Internal Medicine

## 2015-04-02 ENCOUNTER — Ambulatory Visit (INDEPENDENT_AMBULATORY_CARE_PROVIDER_SITE_OTHER): Payer: Medicare Other | Admitting: Internal Medicine

## 2015-04-02 VITALS — BP 132/72 | HR 75 | Temp 97.6°F | Ht 60.0 in | Wt 146.0 lb

## 2015-04-02 DIAGNOSIS — R739 Hyperglycemia, unspecified: Secondary | ICD-10-CM

## 2015-04-02 DIAGNOSIS — G4733 Obstructive sleep apnea (adult) (pediatric): Secondary | ICD-10-CM

## 2015-04-02 DIAGNOSIS — K458 Other specified abdominal hernia without obstruction or gangrene: Secondary | ICD-10-CM | POA: Diagnosis not present

## 2015-04-02 DIAGNOSIS — K219 Gastro-esophageal reflux disease without esophagitis: Secondary | ICD-10-CM | POA: Diagnosis not present

## 2015-04-02 DIAGNOSIS — Z0000 Encounter for general adult medical examination without abnormal findings: Secondary | ICD-10-CM

## 2015-04-02 DIAGNOSIS — C50919 Malignant neoplasm of unspecified site of unspecified female breast: Secondary | ICD-10-CM

## 2015-04-02 DIAGNOSIS — M545 Low back pain: Secondary | ICD-10-CM

## 2015-04-02 DIAGNOSIS — J309 Allergic rhinitis, unspecified: Secondary | ICD-10-CM

## 2015-04-02 DIAGNOSIS — I10 Essential (primary) hypertension: Secondary | ICD-10-CM | POA: Diagnosis not present

## 2015-04-02 DIAGNOSIS — G479 Sleep disorder, unspecified: Secondary | ICD-10-CM

## 2015-04-02 DIAGNOSIS — E7800 Pure hypercholesterolemia, unspecified: Secondary | ICD-10-CM

## 2015-04-02 NOTE — Progress Notes (Signed)
Pre visit review using our clinic review tool, if applicable. No additional management support is needed unless otherwise documented below in the visit note. 

## 2015-04-02 NOTE — Progress Notes (Signed)
Patient ID: Carla Ryan, female   DOB: [birth year], 69 y.o.   MRN: [id number]   Subjective:    Patient ID: Carla Ryan, female    DOB: [birth year], 69 y.o.   MRN: [id number]  HPI  Patient here for a scheduled follow up.  Increased allergy problems.  Wears a mask when outside.  Using flonase and astelin.  Wears CPAP.  Some increased acid reflux.  On carafate now.  Has f/u with GI soon.  Having trouble relaxing.  Present since being on pain meds after surgery.  Off pain medication now.  Taking gabapentin.  Doing ok on the 243m dose.  Some minimal lower extremity swelling.  Swelling better in the am.  S/p hernia repair.  Doing well.    Past Medical History  Diagnosis Date  . Hypertension   . GERD (gastroesophageal reflux disease)   . Hypercholesterolemia   . Osteoarthritis     scoliosis, degenerative disc dz, knee, carpal tunnel  . Anxiety     situational stress and anxiety  . Cancer 2015    breast   . Abdominal hernia   . Hiatal hernia     Current Outpatient Prescriptions on File Prior to Visit  Medication Sig Dispense Refill  . aspirin 81 MG tablet Take 81 mg by mouth daily.    .Marland Kitchenazelastine (ASTELIN) 0.1 % nasal spray Place 2 sprays into both nostrils 2 (two) times daily. Use in each nostril as directed 90 mL 3  . Biotin 5000 MCG TABS Take 1 tablet by mouth daily.    . Calcium Carbonate-Vitamin D (CALCIUM 600 + D PO) Take 600 mg by mouth 2 (two) times daily.    . cetirizine (ZYRTEC) 10 MG tablet Take 10 mg by mouth as needed for allergies.    . cholecalciferol (VITAMIN D) 1000 UNITS tablet Take 2,000 Units by mouth daily.     .Marland KitchenCINNAMON PO Take by mouth 2 (two) times daily.    . fexofenadine (ALLEGRA) 180 MG tablet Take 180 mg by mouth as needed.     . fluticasone (FLONASE) 50 MCG/ACT nasal spray Place 2 sprays into both nostrils daily. 48 g 3  . gabapentin (NEURONTIN) 100 MG capsule Take 2 capsules by mouth at bedtime.     .Marland Kitchenletrozole (FEMARA) 2.5 MG tablet Take 2.5 mg by  mouth daily.    . methocarbamol (ROBAXIN) 500 MG tablet Take 500 mg by mouth 2 (two) times daily as needed for muscle spasms.    . Multiple Vitamin (MULTIVITAMIN) tablet Take 1 tablet by mouth daily.    . niacin (NIASPAN) 1000 MG CR tablet Take 500 mg by mouth 3 (three) times daily.     . RABEprazole (ACIPHEX) 20 MG tablet Take 20 mg by mouth 2 (two) times daily.    .Marland Kitchentriamterene-hydrochlorothiazide (DYAZIDE) 37.5-25 MG per capsule Take 1 each (1 capsule total) by mouth daily. 90 capsule 1  . Zoledronic Acid (ZOMETA IV) Inject into the vein. Every 6 months     No current facility-administered medications on file prior to visit.    Review of Systems  Constitutional: Negative for appetite change and unexpected weight change.  HENT: Positive for congestion. Negative for sinus pressure.        Some allergy symptoms.    Respiratory: Negative for cough, chest tightness and shortness of breath.   Cardiovascular: Negative for chest pain, palpitations and leg swelling.  Gastrointestinal: Negative for nausea, vomiting, abdominal pain and diarrhea.  Musculoskeletal: Negative for joint  swelling.       Lower extremity swelling.  Better in the am.    Skin: Negative for color change and rash.  Neurological: Negative for dizziness, light-headedness and headaches.  Psychiatric/Behavioral: Negative for dysphoric mood and agitation.       Objective:     Blood pressure recheck:  120/68  Physical Exam  Constitutional: She appears well-developed and well-nourished. No distress.  HENT:  Nose: Nose normal.  Mouth/Throat: Oropharynx is clear and moist.  Neck: Neck supple. No thyromegaly present.  Cardiovascular: Normal rate and regular rhythm.   Pulmonary/Chest: Breath sounds normal. No respiratory distress. She has no wheezes.  Abdominal: Soft. Bowel sounds are normal. There is no tenderness.  Musculoskeletal: She exhibits no edema or tenderness.  Lymphadenopathy:    She has no cervical adenopathy.    Skin: No rash noted. No erythema.  Psychiatric: She has a normal mood and affect.    BP 132/72 mmHg  Pulse 75  Temp(Src) 97.6 F (36.4 C) (Oral)  Ht 5' (1.524 m)  Wt 146 lb (66.225 kg)  BMI 28.51 kg/m2  SpO2 99% Wt Readings from Last 3 Encounters:  04/02/15 146 lb (66.225 kg)  03/19/15 147 lb (66.679 kg)  02/17/15 146 lb (66.225 kg)     Lab Results  Component Value Date   WBC 4.6 01/31/2014   HGB 13.8 01/31/2014   HCT 41.6 01/31/2014   PLT 244.0 01/31/2014   GLUCOSE 106* 10/07/2014   CHOL 240* 10/07/2014   TRIG 145.0 10/07/2014   HDL 51.00 10/07/2014   LDLDIRECT 146.9 01/31/2014   LDLCALC 160* 10/07/2014   ALT 24 10/07/2014   AST 23 10/07/2014   NA 139 10/07/2014   K 3.9 10/07/2014   CL 102 10/07/2014   CREATININE 0.9 10/07/2014   BUN 19 10/07/2014   CO2 30 10/07/2014   TSH 3.05 01/31/2014   HGBA1C 6.1 11/05/2014       Assessment & Plan:   Problem List Items Addressed This Visit    Abdominal hernia    S/p surgery 02/10/15.  Doing well.  Follow.        Allergic rhinitis    Using flonase and astelin.  Wears a mask.  Follow.        Back pain    Seeing Dr Dwaine Gale.  S/p injections.  Follow.       Breast cancer    Being followed at [facility].  Seeing Dr Stasia Cavalier and Dr Harden Mo.  Mammogram 12/26/14 - ok.        GERD (gastroesophageal reflux disease)    EGD as outlined.  Some increased symptoms recently.  Taking carafate.  Has f/u soon with GI.        Relevant Medications   sucralfate (CARAFATE) 1 G tablet   Health care maintenance    Physical 10/08/14.  Is s/p hysterectomy.  Mammogram 12/26/14 - ok.  Bone density last year at due.  Colonoscopy 2011 - negative.        Hypercholesterolemia    Low cholesterol diet and exercise.  On niacin.  Follow lipid panel.       Relevant Orders   Lipid panel   Hepatic function panel   Hyperglycemia    Low carb diet and exercise.  Follow met b and a1c.        Relevant Orders   Hemoglobin A1c   Hypertension -  Primary    Blood pressure on recheck 120/68.  Same medication regimen.  Follow pressures.  Follow metabolic panel.  Relevant Orders   Basic metabolic panel   Obstructive sleep apnea    Using CPAP.        Relevant Orders   CBC with Differential/Platelet   TSH   Sleeping difficulty    Doing well with neurontin.  Follow.  Had some lower extremity swelling with the higher dose.  Doing well on 227m q hs.  Follow.          I spent 25 minutes with the patient and more than 50% of the time was spent in consultation regarding the above.     SEinar Pheasant MD

## 2015-04-06 ENCOUNTER — Encounter: Payer: Self-pay | Admitting: Internal Medicine

## 2015-04-06 DIAGNOSIS — G479 Sleep disorder, unspecified: Secondary | ICD-10-CM | POA: Insufficient documentation

## 2015-04-06 NOTE — Assessment & Plan Note (Signed)
Low cholesterol diet and exercise.  On niacin.  Follow lipid panel.

## 2015-04-06 NOTE — Assessment & Plan Note (Signed)
Using flonase and astelin.  Wears a mask.  Follow.

## 2015-04-06 NOTE — Assessment & Plan Note (Signed)
Physical 10/08/14.  Is s/p hysterectomy.  Mammogram 12/26/14 - ok.  Bone density last year at due.  Colonoscopy 2011 - negative.

## 2015-04-06 NOTE — Assessment & Plan Note (Signed)
Doing well with neurontin.  Follow.  Had some lower extremity swelling with the higher dose.  Doing well on 200mg  q hs.  Follow.

## 2015-04-06 NOTE — Assessment & Plan Note (Signed)
S/p surgery 02/10/15.  Doing well.  Follow.

## 2015-04-06 NOTE — Assessment & Plan Note (Signed)
Seeing Dr Dwaine Gale.  S/p injections.  Follow.

## 2015-04-06 NOTE — Assessment & Plan Note (Signed)
Low carb diet and exercise.  Follow met b and a1c.   

## 2015-04-06 NOTE — Assessment & Plan Note (Signed)
Using CPAP 

## 2015-04-06 NOTE — Assessment & Plan Note (Signed)
EGD as outlined.  Some increased symptoms recently.  Taking carafate.  Has f/u soon with GI.

## 2015-04-06 NOTE — Assessment & Plan Note (Signed)
Blood pressure on recheck 120/68.  Same medication regimen.  Follow pressures.  Follow metabolic panel.

## 2015-04-06 NOTE — Assessment & Plan Note (Signed)
Being followed at [facility].  Seeing Dr Stasia Cavalier and Dr Harden Mo.  Mammogram 12/26/14 - ok.

## 2015-04-15 ENCOUNTER — Other Ambulatory Visit (INDEPENDENT_AMBULATORY_CARE_PROVIDER_SITE_OTHER): Payer: Medicare Other

## 2015-04-15 DIAGNOSIS — R739 Hyperglycemia, unspecified: Secondary | ICD-10-CM

## 2015-04-15 DIAGNOSIS — G4733 Obstructive sleep apnea (adult) (pediatric): Secondary | ICD-10-CM | POA: Diagnosis not present

## 2015-04-15 DIAGNOSIS — E7800 Pure hypercholesterolemia, unspecified: Secondary | ICD-10-CM

## 2015-04-15 DIAGNOSIS — I10 Essential (primary) hypertension: Secondary | ICD-10-CM | POA: Diagnosis not present

## 2015-04-15 LAB — CBC WITH DIFFERENTIAL/PLATELET
Basophils Absolute: 0 10*3/uL (ref 0.0–0.1)
Basophils Relative: 0.4 % (ref 0.0–3.0)
EOS PCT: 4.4 % (ref 0.0–5.0)
Eosinophils Absolute: 0.2 10*3/uL (ref 0.0–0.7)
HCT: 39 % (ref 36.0–46.0)
HEMOGLOBIN: 13.5 g/dL (ref 12.0–15.0)
Lymphocytes Relative: 22.9 % (ref 12.0–46.0)
Lymphs Abs: 1.1 10*3/uL (ref 0.7–4.0)
MCHC: 34.7 g/dL (ref 30.0–36.0)
MCV: 89.2 fl (ref 78.0–100.0)
MONO ABS: 0.4 10*3/uL (ref 0.1–1.0)
Monocytes Relative: 8.3 % (ref 3.0–12.0)
NEUTROS ABS: 3.2 10*3/uL (ref 1.4–7.7)
Neutrophils Relative %: 64 % (ref 43.0–77.0)
PLATELETS: 226 10*3/uL (ref 150.0–400.0)
RBC: 4.37 Mil/uL (ref 3.87–5.11)
RDW: 14.5 % (ref 11.5–15.5)
WBC: 4.9 10*3/uL (ref 4.0–10.5)

## 2015-04-15 LAB — BASIC METABOLIC PANEL
BUN: 23 mg/dL (ref 6–23)
CO2: 32 mEq/L (ref 19–32)
CREATININE: 0.94 mg/dL (ref 0.40–1.20)
Calcium: 9.8 mg/dL (ref 8.4–10.5)
Chloride: 102 mEq/L (ref 96–112)
GFR: 62.36 mL/min (ref >60.00)
Glucose, Bld: 94 mg/dL (ref 70–99)
POTASSIUM: 3.8 meq/L (ref 3.5–5.1)
Sodium: 138 mEq/L (ref 135–145)

## 2015-04-15 LAB — LIPID PANEL
Cholesterol: 213 mg/dL — ABNORMAL HIGH (ref 0–200)
HDL: 61.7 mg/dL (ref >39.00)
LDL Cholesterol: 124 mg/dL — ABNORMAL HIGH (ref 0–99)
NonHDL: 151.3
Total CHOL/HDL Ratio: 3
Triglycerides: 135 mg/dL (ref 0.0–149.0)
VLDL: 27 mg/dL (ref 0.0–40.0)

## 2015-04-15 LAB — HEPATIC FUNCTION PANEL
ALBUMIN: 4 g/dL (ref 3.5–5.2)
ALT: 17 U/L (ref 0–35)
AST: 20 U/L (ref 0–37)
Alkaline Phosphatase: 51 U/L (ref 39–117)
Bilirubin, Direct: 0.1 mg/dL (ref 0.0–0.3)
Total Bilirubin: 0.5 mg/dL (ref 0.2–1.2)
Total Protein: 6.4 g/dL (ref 6.0–8.3)

## 2015-04-15 LAB — TSH: TSH: 1.49 u[IU]/mL (ref 0.35–4.50)

## 2015-04-15 LAB — HEMOGLOBIN A1C: Hgb A1c MFr Bld: 6 % (ref 4.6–6.5)

## 2015-04-16 ENCOUNTER — Encounter: Payer: Self-pay | Admitting: Internal Medicine

## 2015-04-20 NOTE — Op Note (Signed)
PATIENT NAME:  Carla Ryan, Carla Ryan MR#:  [id number] DATE OF BIRTH:  1946-01-11  DATE OF PROCEDURE:  02/10/2015  PREOPERATIVE DIAGNOSIS: Lateral abdominal wall hernia.   POSTOPERATIVE DIAGNOSIS:  Lateral abdominal wall hernia.  OPERATIVE PROCEDURE: Laparoscopic repair of lateral abdominal wall hernia.   SURGEON: Hervey Ard, [name]  ANESTHESIA: General endotracheal under Dr. Jonna Coup, Marcaine 0.5% plain 15 mL local infiltration.  CLINICAL NOTE: This 69 year old woman has developed a lateral abdominal bulge with pain at the site and burning sensation inferior and medial. The clinical exam was suggestive of a lateral abdominal wall hernia, possibly related to an appendectomy completed in 2001. CT scan showed the transverse abdominis and internal oblique muscles were missing in this area and the defect was contained only by the external oblique muscle and fascia. Due to her symptoms, she was felt to be a candidate for repair.   OPERATIVE NOTE: With the patient under adequate general endotracheal anesthesia and a Foley catheter placed by the nurse, the patient received 1 gram of Kefzol intravenously. In Trendelenburg position, a Veress needle was placed to a transumbilical incision. After assuring intra-abdominal location with the hanging drop test, the abdomen was insufflated with CO2 at 10 mmHg pressure. It was found that the tip of the needle was actually in the preperitoneal space and it was repositioned to provide good pneumoperitoneum. A 5 mm step port was expanded and except for the identification of gas between the peritoneum and the fascia no other abnormalities were noted regarding needle placement. Inspection showed a loop of small bowel adherent up into the area of the hernia. A 5 mm step port was placed in the left lower quadrant and an 11 mm XL port placed in the left lateral abdominal wall under direct vision along the anterior axillary line. Using sharp and cautery dissection, adhesions of the  small intestine of the hernia defect were taken down to clear what appeared to be approximately 2 x 2.5 inch defect in the right lateral abdominal wall. A 4.5 inch Ventralex ST mesh was chosen and placed intraperitoneally after 4 sutures of 2-0 Maxon were attached to the mesh prior to passage into the abdominal cavity. A suture passer was used and the sutures were placed at the 12, 2, 4, and 6 o'clock position were snugged up against the anterior abdominal wall. Ethicon SecureStrap tacks were used in a circumferential rose around the mesh and the defect. Good coverage was noted. The abdomen was then desufflated and appropriate positioning of the mesh was appreciated. The 11 mm port site was closed with a 2-0 Maxon suture under direct vision. Skin incisions were closed with 4-0 Vicryl subcuticular sutures. Benzoin and Steri-Strips were applied. Marcaine was infiltrated to the port sites for postoperative analgesia. A Telfa pad was applied to the site of the transabdominal sutures.    ____________________________ Robert Bellow, MD jwb:mc D: 02/10/2015 17:59:56 ET T: 02/11/2015 10:15:35 ET JOB#: [id number]  cc: Robert Bellow, MD, <Dictator> Einar Pheasant, MD  Kaimana Neuzil Amedeo Kinsman MD ELECTRONICALLY SIGNED 02/11/2015 17:33

## 2015-08-05 ENCOUNTER — Ambulatory Visit (INDEPENDENT_AMBULATORY_CARE_PROVIDER_SITE_OTHER): Payer: Medicare Other | Admitting: Internal Medicine

## 2015-08-05 ENCOUNTER — Encounter: Payer: Self-pay | Admitting: Internal Medicine

## 2015-08-05 VITALS — BP 126/74 | HR 70 | Temp 98.1°F | Ht 60.0 in | Wt 142.2 lb

## 2015-08-05 DIAGNOSIS — R140 Abdominal distension (gaseous): Secondary | ICD-10-CM

## 2015-08-05 DIAGNOSIS — R739 Hyperglycemia, unspecified: Secondary | ICD-10-CM | POA: Diagnosis not present

## 2015-08-05 DIAGNOSIS — E7800 Pure hypercholesterolemia, unspecified: Secondary | ICD-10-CM

## 2015-08-05 DIAGNOSIS — G4733 Obstructive sleep apnea (adult) (pediatric): Secondary | ICD-10-CM

## 2015-08-05 DIAGNOSIS — K458 Other specified abdominal hernia without obstruction or gangrene: Secondary | ICD-10-CM

## 2015-08-05 DIAGNOSIS — I10 Essential (primary) hypertension: Secondary | ICD-10-CM

## 2015-08-05 DIAGNOSIS — K219 Gastro-esophageal reflux disease without esophagitis: Secondary | ICD-10-CM

## 2015-08-05 DIAGNOSIS — C50919 Malignant neoplasm of unspecified site of unspecified female breast: Secondary | ICD-10-CM

## 2015-08-05 LAB — HEPATIC FUNCTION PANEL
ALBUMIN: 4.1 g/dL (ref 3.5–5.2)
ALK PHOS: 50 U/L (ref 39–117)
ALT: 16 U/L (ref 0–35)
AST: 19 U/L (ref 0–37)
BILIRUBIN DIRECT: 0.1 mg/dL (ref 0.0–0.3)
TOTAL PROTEIN: 6.9 g/dL (ref 6.0–8.3)
Total Bilirubin: 0.4 mg/dL (ref 0.2–1.2)

## 2015-08-05 LAB — LIPID PANEL
CHOL/HDL RATIO: 3
CHOLESTEROL: 218 mg/dL — AB (ref 0–200)
HDL: 62.8 mg/dL (ref >39.00)
LDL Cholesterol: 133 mg/dL — ABNORMAL HIGH (ref 0–99)
NonHDL: 154.94
Triglycerides: 109 mg/dL (ref 0.0–149.0)
VLDL: 21.8 mg/dL (ref 0.0–40.0)

## 2015-08-05 LAB — BASIC METABOLIC PANEL
BUN: 28 mg/dL — AB (ref 6–23)
CHLORIDE: 101 meq/L (ref 96–112)
CO2: 32 mEq/L (ref 19–32)
Calcium: 9.9 mg/dL (ref 8.4–10.5)
Creatinine, Ser: 0.85 mg/dL (ref 0.40–1.20)
GFR: 69.97 mL/min (ref >60.00)
GLUCOSE: 94 mg/dL (ref 70–99)
POTASSIUM: 3.9 meq/L (ref 3.5–5.1)
Sodium: 138 mEq/L (ref 135–145)

## 2015-08-05 LAB — CBC WITH DIFFERENTIAL/PLATELET
BASOS PCT: 0.4 % (ref 0.0–3.0)
Basophils Absolute: 0 10*3/uL (ref 0.0–0.1)
Eosinophils Absolute: 0.2 10*3/uL (ref 0.0–0.7)
Eosinophils Relative: 3.3 % (ref 0.0–5.0)
HCT: 40.8 % (ref 36.0–46.0)
Hemoglobin: 13.7 g/dL (ref 12.0–15.0)
LYMPHS PCT: 23.8 % (ref 12.0–46.0)
Lymphs Abs: 1.3 10*3/uL (ref 0.7–4.0)
MCHC: 33.6 g/dL (ref 30.0–36.0)
MCV: 91.2 fl (ref 78.0–100.0)
MONO ABS: 0.4 10*3/uL (ref 0.1–1.0)
Monocytes Relative: 7.4 % (ref 3.0–12.0)
NEUTROS ABS: 3.5 10*3/uL (ref 1.4–7.7)
NEUTROS PCT: 65.1 % (ref 43.0–77.0)
PLATELETS: 233 10*3/uL (ref 150.0–400.0)
RBC: 4.47 Mil/uL (ref 3.87–5.11)
RDW: 14.6 % (ref 11.5–15.5)
WBC: 5.4 10*3/uL (ref 4.0–10.5)

## 2015-08-05 LAB — HEMOGLOBIN A1C: Hgb A1c MFr Bld: 6.1 % (ref 4.6–6.5)

## 2015-08-05 MED ORDER — GABAPENTIN 100 MG PO CAPS: 200.0000 mg | ORAL_CAPSULE | Freq: Every day | ORAL | Status: DC

## 2015-08-05 NOTE — Progress Notes (Signed)
Pre visit review using our clinic review tool, if applicable. No additional management support is needed unless otherwise documented below in the visit note. 

## 2015-08-05 NOTE — Progress Notes (Signed)
Patient ID: Carla Ryan, female   DOB: May 10, 1946, 69 y.o.   MRN: [id number]   Subjective:    Patient ID: Carla Ryan, female    DOB: 1946-04-06, 69 y.o.   MRN: [id number]  HPI  Patient here for a scheduled follow up.  Is s/p lap repair of lateral wall hernia.  Pain better for a while.  Has noticed over the last two weeks - some RLQ pain.  Not severe.  No constant.  No change in her bowels.  She does report some burping.  She is taking carafate.  This has helped previously.  When added third pill back, helped.  She reports cardiac status stable.  Has f/u planned with Dr Saralyn Pilar next week.  No increased heart rate or palpitations.  No increased cough or congestion.  No sob.     Past Medical History  Diagnosis Date  . Hypertension   . GERD (gastroesophageal reflux disease)   . Hypercholesterolemia   . Osteoarthritis     scoliosis, degenerative disc dz, knee, carpal tunnel  . Anxiety     situational stress and anxiety  . Cancer 2015    breast   . Abdominal hernia   . Hiatal hernia     Family history and social history reviewed and unchanged.     Outpatient Encounter Prescriptions as of 08/05/2015  Medication Sig  . aspirin 81 MG tablet Take 81 mg by mouth daily.  Marland Kitchen azelastine (ASTELIN) 0.1 % nasal spray Place 2 sprays into both nostrils 2 (two) times daily. Use in each nostril as directed  . Calcium Carbonate-Vitamin D (CALCIUM 600 + D PO) Take 600 mg by mouth 2 (two) times daily.  . cetirizine (ZYRTEC) 10 MG tablet Take 10 mg by mouth as needed for allergies.  . cholecalciferol (VITAMIN D) 1000 UNITS tablet Take 2,000 Units by mouth daily.   Marland Kitchen CINNAMON PO Take by mouth 2 (two) times daily.  . fexofenadine (ALLEGRA) 180 MG tablet Take 180 mg by mouth as needed.   . fluticasone (FLONASE) 50 MCG/ACT nasal spray Place 2 sprays into both nostrils daily.  Marland Kitchen gabapentin (NEURONTIN) 100 MG capsule Take 2 capsules (200 mg total) by mouth at bedtime.  Marland Kitchen letrozole (FEMARA) 2.5 MG  tablet Take 2.5 mg by mouth daily.  . methocarbamol (ROBAXIN) 500 MG tablet Take 500 mg by mouth 2 (two) times daily as needed for muscle spasms.  . Multiple Vitamin (MULTIVITAMIN) tablet Take 1 tablet by mouth daily.  . niacin (NIASPAN) 1000 MG CR tablet Take 500 mg by mouth 3 (three) times daily.   . RABEprazole (ACIPHEX) 20 MG tablet Take 20 mg by mouth 2 (two) times daily.  . sucralfate (CARAFATE) 1 G tablet Take 1 g by mouth 4 (four) times daily -  with meals and at bedtime.  . triamterene-hydrochlorothiazide (DYAZIDE) 37.5-25 MG per capsule Take 1 each (1 capsule total) by mouth daily.  . Zoledronic Acid (ZOMETA IV) Inject into the vein. Every 6 months  . [DISCONTINUED] gabapentin (NEURONTIN) 100 MG capsule Take 2 capsules by mouth at bedtime.   . [DISCONTINUED] Biotin 5000 MCG TABS Take 1 tablet by mouth daily.   No facility-administered encounter medications on file as of 08/05/2015.    Review of Systems  Constitutional: Negative for appetite change and unexpected weight change.  HENT: Negative for congestion and sinus pressure.   Eyes: Negative for discharge and visual disturbance.  Respiratory: Negative for cough, chest tightness and shortness of breath.   Cardiovascular:  Negative for chest pain, palpitations and leg swelling.  Gastrointestinal: Negative for nausea, vomiting, abdominal pain and diarrhea.       Increased burping.  Taking carafate.    Genitourinary: Negative for dysuria and difficulty urinating.  Musculoskeletal: Negative for joint swelling.  Skin: Negative for color change and rash.  Neurological: Negative for dizziness, light-headedness and headaches.  Psychiatric/Behavioral: Negative for dysphoric mood and agitation.       Objective:    Physical Exam  Constitutional: She appears well-developed and well-nourished. No distress.  HENT:  Nose: Nose normal.  Mouth/Throat: Oropharynx is clear and moist.  Eyes: Conjunctivae are normal. Right eye exhibits no  discharge. Left eye exhibits no discharge.  Neck: Neck supple. No thyromegaly present.  Cardiovascular: Normal rate and regular rhythm.   Pulmonary/Chest: Breath sounds normal. No respiratory distress. She has no wheezes.  Abdominal: Soft. Bowel sounds are normal. There is no tenderness.  Musculoskeletal: She exhibits no edema or tenderness.  Lymphadenopathy:    She has no cervical adenopathy.  Skin: No rash noted. No erythema.  Psychiatric: She has a normal mood and affect. Her behavior is normal.    BP 126/74 mmHg  Pulse 70  Temp(Src) 98.1 F (36.7 C) (Oral)  Ht 5' (1.524 m)  Wt 142 lb 3.2 oz (64.501 kg)  BMI 27.77 kg/m2  SpO2 97% Wt Readings from Last 3 Encounters:  08/05/15 142 lb 3.2 oz (64.501 kg)  04/02/15 146 lb (66.225 kg)  03/19/15 147 lb (66.679 kg)     Lab Results  Component Value Date   WBC 5.4 08/05/2015   HGB 13.7 08/05/2015   HCT 40.8 08/05/2015   PLT 233.0 08/05/2015   GLUCOSE 94 08/05/2015   CHOL 218* 08/05/2015   TRIG 109.0 08/05/2015   HDL 62.80 08/05/2015   LDLDIRECT 146.9 01/31/2014   LDLCALC 133* 08/05/2015   ALT 16 08/05/2015   AST 19 08/05/2015   NA 138 08/05/2015   K 3.9 08/05/2015   CL 101 08/05/2015   CREATININE 0.85 08/05/2015   BUN 28* 08/05/2015   CO2 32 08/05/2015   TSH 1.49 04/15/2015   HGBA1C 6.1 08/05/2015       Assessment & Plan:   Problem List Items Addressed This Visit    Abdominal hernia    S/p surgery 02/10/15.  Some RLQ discomfort intermittently.  Not severe.  She desires no further w/up or evaluation at this time.  Follow.       Breast cancer    Being followed at [facility].  Seeing Dr Stasia Cavalier and Dr Harden Mo.  Mammogram 12/26/14 - ok.        Relevant Medications   gabapentin (NEURONTIN) 100 MG capsule   GERD (gastroesophageal reflux disease)    EGD 05/10/14 - irregular Z-line, bile gastritis and hiatal hernia.  With the increased burping and upper GI symptoms - on carafate.  Better on three per day.  Given  persistent symptoms despite medications, refer to GI.        Relevant Orders   Ambulatory referral to Gastroenterology   Hypercholesterolemia - Primary    Low cholesterol diet and exercise.  Follow lipid panel.        Relevant Orders   Lipid panel (Completed)   Hepatic function panel (Completed)   Hyperglycemia    Low carb diet.  Follow met b and a1c.       Relevant Orders   Hemoglobin A1c (Completed)   Hypertension    Blood pressure under good control.  Continue same  medication regimen.  Follow pressures.  Follow metabolic panel.        Relevant Orders   CBC with Differential/Platelet (Completed)   Basic metabolic panel (Completed)   Obstructive sleep apnea    Using CPAP.        Other Visit Diagnoses    Bloating        Relevant Orders    Ambulatory referral to Gastroenterology        Einar Pheasant, MD

## 2015-08-06 ENCOUNTER — Encounter: Payer: Self-pay | Admitting: Internal Medicine

## 2015-08-06 NOTE — Assessment & Plan Note (Signed)
Using CPAP 

## 2015-08-06 NOTE — Assessment & Plan Note (Signed)
Blood pressure under good control.  Continue same medication regimen.  Follow pressures.  Follow metabolic panel.   

## 2015-08-06 NOTE — Assessment & Plan Note (Signed)
Low carb diet.  Follow met b and a1c.  

## 2015-08-06 NOTE — Assessment & Plan Note (Signed)
Low cholesterol diet and exercise.  Follow lipid panel.   

## 2015-08-06 NOTE — Assessment & Plan Note (Signed)
Being followed at [facility].  Seeing Dr Stasia Cavalier and Dr Harden Mo.  Mammogram 12/26/14 - ok.

## 2015-08-06 NOTE — Assessment & Plan Note (Signed)
S/p surgery 02/10/15.  Some RLQ discomfort intermittently.  Not severe.  She desires no further w/up or evaluation at this time.  Follow.

## 2015-08-06 NOTE — Assessment & Plan Note (Signed)
EGD 05/10/14 - irregular Z-line, bile gastritis and hiatal hernia.  With the increased burping and upper GI symptoms - on carafate.  Better on three per day.  Given persistent symptoms despite medications, refer to GI.

## 2015-08-12 ENCOUNTER — Other Ambulatory Visit: Payer: Self-pay | Admitting: Surgical

## 2015-08-12 MED ORDER — TRIAMTERENE-HCTZ 37.5-25 MG PO CAPS: 1.0000 | ORAL_CAPSULE | Freq: Every day | ORAL | Status: DC

## 2015-08-12 NOTE — Telephone Encounter (Signed)
RX sent to pharmacy  

## 2015-11-05 ENCOUNTER — Ambulatory Visit (INDEPENDENT_AMBULATORY_CARE_PROVIDER_SITE_OTHER): Payer: Medicare Other | Admitting: Internal Medicine

## 2015-11-05 ENCOUNTER — Other Ambulatory Visit: Payer: Self-pay | Admitting: Internal Medicine

## 2015-11-05 ENCOUNTER — Encounter: Payer: Self-pay | Admitting: Internal Medicine

## 2015-11-05 VITALS — BP 128/70 | HR 75 | Temp 98.0°F | Resp 18 | Ht 60.0 in | Wt 145.0 lb

## 2015-11-05 DIAGNOSIS — E7800 Pure hypercholesterolemia, unspecified: Secondary | ICD-10-CM

## 2015-11-05 DIAGNOSIS — Z23 Encounter for immunization: Secondary | ICD-10-CM | POA: Diagnosis not present

## 2015-11-05 DIAGNOSIS — K219 Gastro-esophageal reflux disease without esophagitis: Secondary | ICD-10-CM

## 2015-11-05 DIAGNOSIS — G4733 Obstructive sleep apnea (adult) (pediatric): Secondary | ICD-10-CM

## 2015-11-05 DIAGNOSIS — R0602 Shortness of breath: Secondary | ICD-10-CM

## 2015-11-05 DIAGNOSIS — I10 Essential (primary) hypertension: Secondary | ICD-10-CM

## 2015-11-05 DIAGNOSIS — R739 Hyperglycemia, unspecified: Secondary | ICD-10-CM | POA: Diagnosis not present

## 2015-11-05 DIAGNOSIS — J309 Allergic rhinitis, unspecified: Secondary | ICD-10-CM

## 2015-11-05 DIAGNOSIS — C50919 Malignant neoplasm of unspecified site of unspecified female breast: Secondary | ICD-10-CM

## 2015-11-05 LAB — LIPID PANEL
CHOLESTEROL: 224 mg/dL — AB (ref 0–200)
HDL: 66.8 mg/dL (ref >39.00)
LDL CALC: 136 mg/dL — AB (ref 0–99)
NonHDL: 156.7
TRIGLYCERIDES: 105 mg/dL (ref 0.0–149.0)
Total CHOL/HDL Ratio: 3
VLDL: 21 mg/dL (ref 0.0–40.0)

## 2015-11-05 LAB — HEPATIC FUNCTION PANEL
ALK PHOS: 48 U/L (ref 39–117)
ALT: 15 U/L (ref 0–35)
AST: 17 U/L (ref 0–37)
Albumin: 4.1 g/dL (ref 3.5–5.2)
BILIRUBIN DIRECT: 0.1 mg/dL (ref 0.0–0.3)
BILIRUBIN TOTAL: 0.5 mg/dL (ref 0.2–1.2)
TOTAL PROTEIN: 6.9 g/dL (ref 6.0–8.3)

## 2015-11-05 LAB — BASIC METABOLIC PANEL
BUN: 19 mg/dL (ref 6–23)
CALCIUM: 9.9 mg/dL (ref 8.4–10.5)
CO2: 31 meq/L (ref 19–32)
CREATININE: 0.82 mg/dL (ref 0.40–1.20)
Chloride: 102 mEq/L (ref 96–112)
GFR: 72.89 mL/min (ref >60.00)
GLUCOSE: 94 mg/dL (ref 70–99)
Potassium: 3.9 mEq/L (ref 3.5–5.1)
Sodium: 139 mEq/L (ref 135–145)

## 2015-11-05 LAB — HEMOGLOBIN A1C: Hgb A1c MFr Bld: 6 % (ref 4.6–6.5)

## 2015-11-05 MED ORDER — GABAPENTIN 100 MG PO CAPS: 200.0000 mg | ORAL_CAPSULE | Freq: Every day | ORAL | Status: DC

## 2015-11-05 MED ORDER — TRIAMTERENE-HCTZ 37.5-25 MG PO CAPS: 1.0000 | ORAL_CAPSULE | Freq: Every day | ORAL | Status: DC

## 2015-11-05 NOTE — Patient Instructions (Signed)

## 2015-11-05 NOTE — Progress Notes (Signed)
Patient ID: JEANELL MANGAN, female   DOB: 05/05/1946, 69 y.o.   MRN: [id number]   Subjective:    Patient ID: [name], female    DOB: 02-23-1946, 69 y.o.   MRN: [id number]  HPI  Patient with past history of hypertension, GERD, OA, breast cancer and hypercholesterolemia.  She comes in today to follow up on these issues.  She has been worked up recently for sob with exertion.  Saw cardiology.  Had ETT sestamibi.  Per report - negative.  States breathing is stable.  Still some sob.  Discussed further w/up.  She declines.  Feels is stable.  Due for f/u mammogram 12/2015.  Will have at [facility].  Bowels are better.  Had negative cologuard.  EGD 2015 and colonoscopy 2011. Last saw oncology - 04/25/15.  On femara.  On zometa for her bones.  Last bone density - osteopenia 07/19/14.     Past Medical History  Diagnosis Date  . Hypertension   . GERD (gastroesophageal reflux disease)   . Hypercholesterolemia   . Osteoarthritis     scoliosis, degenerative disc dz, knee, carpal tunnel  . Anxiety     situational stress and anxiety  . Cancer ([place]) 2015    breast   . Abdominal hernia   . Hiatal hernia    Past Surgical History  Procedure Laterality Date  . Appendectomy  2000  . Abdominal hysterectomy  07/2000  . Carpal tunnel release    . Breast biopsy Bilateral 2014  . Breast surgery Right 2015    lumpectomy  . Colonoscopy  2011    [name], [name]: Normal exam  . Upper gi endoscopy  05/10/2014    [name], [name] Bile gastritis, medium hiatal hernia. Incomplete visualization of the cardia and fundus.  Marland Kitchen Hernia repair  02/10/15  . Laparoscopic repair of lateral abdominal wall hernia  02/10/15   Family History  Problem Relation Age of Onset  . Diabetes Mother   . Diabetes Brother   . Diabetes Sister     x2  . Hypertension Brother   . Multiple myeloma Mother   . Multiple myeloma Father    Social History   Social History  . Marital Status: Married    Spouse Name: N/A  .  Number of Children: 2  . Years of Education: N/A   Social History Main Topics  . Smoking status: Never Smoker   . Smokeless tobacco: Never Used  . Alcohol Use: 0.0 oz/week    0 Standard drinks or equivalent per week     Comment: wine occasionally  . Drug Use: No  . Sexual Activity: Yes   Other Topics Concern  . None   Social History Narrative    Outpatient Encounter Prescriptions as of 11/05/2015  Medication Sig  . aspirin 81 MG tablet Take 81 mg by mouth daily.  Marland Kitchen azelastine (ASTELIN) 0.1 % nasal spray Place 2 sprays into both nostrils 2 (two) times daily. Use in each nostril as directed  . Calcium Carbonate-Vitamin D (CALCIUM 600 + D PO) Take 600 mg by mouth 2 (two) times daily.  . cetirizine (ZYRTEC) 10 MG tablet Take 10 mg by mouth as needed for allergies.  . cholecalciferol (VITAMIN D) 1000 UNITS tablet Take 2,000 Units by mouth daily.   Marland Kitchen CINNAMON PO Take by mouth 2 (two) times daily.  . fexofenadine (ALLEGRA) 180 MG tablet Take 180 mg by mouth as needed.   . fluticasone (FLONASE) 50 MCG/ACT nasal spray Place 2 sprays  into both nostrils daily.  Marland Kitchen gabapentin (NEURONTIN) 100 MG capsule Take 2 capsules (200 mg total) by mouth at bedtime.  Marland Kitchen letrozole (FEMARA) 2.5 MG tablet Take 2.5 mg by mouth daily.  . methocarbamol (ROBAXIN) 500 MG tablet Take 500 mg by mouth 2 (two) times daily as needed for muscle spasms.  . Multiple Vitamin (MULTIVITAMIN) tablet Take 1 tablet by mouth daily.  . niacin (NIASPAN) 1000 MG CR tablet Take 1,000 mg by mouth 2 (two) times daily.   . RABEprazole (ACIPHEX) 20 MG tablet Take 20 mg by mouth 2 (two) times daily.  . sucralfate (CARAFATE) 1 G tablet Take 1 g by mouth 2 (two) times daily.   Marland Kitchen triamterene-hydrochlorothiazide (DYAZIDE) 37.5-25 MG capsule Take 1 each (1 capsule total) by mouth daily.  . Zoledronic Acid (ZOMETA IV) Inject into the vein. Every 6 months  . [DISCONTINUED] gabapentin (NEURONTIN) 100 MG capsule Take 2 capsules by mouth at  bedtime  . [DISCONTINUED] triamterene-hydrochlorothiazide (DYAZIDE) 37.5-25 MG per capsule Take 1 each (1 capsule total) by mouth daily.   No facility-administered encounter medications on file as of 11/05/2015.    Review of Systems  Constitutional: Negative for appetite change and unexpected weight change.  HENT: Negative for congestion and sinus pressure.   Eyes: Negative for pain and discharge.  Respiratory: Negative for cough and chest tightness.        Some sob with exertion.  Stable.   Cardiovascular: Negative for chest pain, palpitations and leg swelling.  Gastrointestinal: Negative for nausea, vomiting, diarrhea and abdominal distention.       Bowels better.   Genitourinary: Negative for dysuria and difficulty urinating.  Musculoskeletal: Negative for myalgias and joint swelling.  Skin: Negative for color change and rash.  Neurological: Negative for dizziness, light-headedness and headaches.  Psychiatric/Behavioral: Negative for dysphoric mood and agitation.       Objective:    Physical Exam  Constitutional: She appears well-developed and well-nourished. No distress.  HENT:  Nose: Nose normal.  Mouth/Throat: Oropharynx is clear and moist.  Eyes: Conjunctivae are normal. Right eye exhibits no discharge. Left eye exhibits no discharge.  Neck: Neck supple. No thyromegaly present.  Cardiovascular: Normal rate and regular rhythm.   Pulmonary/Chest: Breath sounds normal. No respiratory distress. She has no wheezes.  Abdominal: Soft. Bowel sounds are normal. There is no tenderness.  Musculoskeletal: She exhibits no edema or tenderness.  Lymphadenopathy:    She has no cervical adenopathy.  Skin: No rash noted. No erythema.  Psychiatric: She has a normal mood and affect. Her behavior is normal.    BP 128/70 mmHg  Pulse 75  Temp(Src) 98 F (36.7 C) (Oral)  Resp 18  Ht 5' (1.524 m)  Wt 145 lb (65.772 kg)  BMI 28.32 kg/m2  SpO2 94% Wt Readings from Last 3 Encounters:    11/07/15 144 lb 12.8 oz (65.681 kg)  11/05/15 145 lb (65.772 kg)  08/05/15 142 lb 3.2 oz (64.501 kg)     Lab Results  Component Value Date   WBC 5.4 08/05/2015   HGB 13.7 08/05/2015   HCT 40.8 08/05/2015   PLT 233.0 08/05/2015   GLUCOSE 94 11/05/2015   CHOL 224* 11/05/2015   TRIG 105.0 11/05/2015   HDL 66.80 11/05/2015   LDLDIRECT 146.9 01/31/2014   LDLCALC 136* 11/05/2015   ALT 15 11/05/2015   AST 17 11/05/2015   NA 139 11/05/2015   K 3.9 11/05/2015   CL 102 11/05/2015   CREATININE 0.82 11/05/2015   BUN  19 11/05/2015   CO2 31 11/05/2015   TSH 1.49 04/15/2015   HGBA1C 6.0 11/05/2015       Assessment & Plan:   Problem List Items Addressed This Visit    Allergic rhinitis    Uses flonase and astelin.  Follow.       Breast cancer [facility])    Being followed at [facility].  Seeing Dr Stasia Cavalier and Dr Harden Mo.  Mammogram 01/05/15 - ok.  Due f/u in one year.        Relevant Medications   gabapentin (NEURONTIN) 100 MG capsule   GERD (gastroesophageal reflux disease)    EGD 05/10/14 - irregular z-line, bile gastritis and hiatal hernia.  Upper symptoms controlled.        Hypercholesterolemia    Low cholesterol diet and exercise.  Follow lipid panel.        Relevant Medications   triamterene-hydrochlorothiazide (DYAZIDE) 37.5-25 MG capsule   Other Relevant Orders   Lipid panel (Completed)   Hepatic function panel (Completed)   Hyperglycemia    Low carb diet and exercise.  Follow met b and a1c.       Relevant Orders   Hemoglobin A1c (Completed)   Hypertension - Primary    Blood pressure under good control.  Continue same medication regimen.  Follow pressures.  Follow metabolic panel.        Relevant Medications   triamterene-hydrochlorothiazide (DYAZIDE) 37.5-25 MG capsule   Other Relevant Orders   Basic metabolic panel (Completed)   Obstructive sleep apnea    Using CPAP.       SOB (shortness of breath)    Had cardiac w/up recently.  Negative.  Discussed  further evaluation with her.  She declines.  Will follow.  Notify me if change.         Other Visit Diagnoses    Need for prophylactic vaccination against Streptococcus pneumoniae (pneumococcus)        Relevant Orders    Pneumococcal polysaccharide vaccine 23-valent greater than or equal to 2yo subcutaneous/IM (Completed)        Einar Pheasant, MD

## 2015-11-05 NOTE — Progress Notes (Signed)
Pre-visit discussion using our clinic review tool. No additional management support is needed unless otherwise documented below in the visit note.  

## 2015-11-06 ENCOUNTER — Encounter: Payer: Self-pay | Admitting: Internal Medicine

## 2015-11-07 ENCOUNTER — Ambulatory Visit (INDEPENDENT_AMBULATORY_CARE_PROVIDER_SITE_OTHER): Payer: Medicare Other

## 2015-11-07 VITALS — BP 128/72 | HR 67 | Temp 97.1°F | Resp 12 | Ht 59.5 in | Wt 144.8 lb

## 2015-11-07 DIAGNOSIS — Z0000 Encounter for general adult medical examination without abnormal findings: Secondary | ICD-10-CM | POA: Diagnosis not present

## 2015-11-07 DIAGNOSIS — Z23 Encounter for immunization: Secondary | ICD-10-CM

## 2015-11-07 NOTE — Patient Instructions (Addendum)
Carla Ryan,   Thank you for taking time to come for your Medicare Wellness Visit.  I appreciate your ongoing commitment to your health goals. Please review the following plan we discussed and let me know if I can assist you in the future.  Influenza vaccine administered today  Hep C screening postponed until March  Follow up with PCP in March as previously scheduled Bone Densitometry Bone densitometry is an imaging test that uses a special X-ray to measure the amount of calcium and other minerals in your bones (bone density). This test is also known as a bone mineral density test or dual-energy X-ray absorptiometry (DXA). The test can measure bone density at your hip and your spine. It is similar to having a regular X-ray. You may have this test to:  Diagnose a condition that causes weak or thin bones (osteoporosis).  Predict your risk of a broken bone (fracture).  Determine how well osteoporosis treatment is working. LET [facility] CARE PROVIDER KNOW ABOUT:  Any allergies you have.  All medicines you are taking, including vitamins, herbs, eye drops, creams, and over-the-counter medicines.  Previous problems you or members of your family have had with the use of anesthetics.  Any blood disorders you have.  Previous surgeries you have had.  Medical conditions you have.  Possibility of pregnancy.  Any other medical test you had within the previous 14 days that used contrast material. RISKS AND COMPLICATIONS Generally, this is a safe procedure. However, problems can occur and may include the following:  This test exposes you to a very small amount of radiation.  The risks of radiation exposure may be greater to unborn children. BEFORE THE PROCEDURE  Do not take any calcium supplements for 24 hours before having the test. You can otherwise eat and drink what you usually do.  Take off all metal jewelry, eyeglasses, dental appliances, and any other metal  objects. PROCEDURE  You may lie on an exam table. There will be an X-ray generator below you and an imaging device above you.  Other devices, such as boxes or braces, may be used to position your body properly for the scan.  You will need to lie still while the machine slowly scans your body.  The images will show up on a computer monitor. AFTER THE PROCEDURE You may need more testing at a later time.   This information is not intended to replace advice given to you by your health care provider. Make sure you discuss any questions you have with your health care provider.   Document Released: 12/28/2004 Document Revised: 12/27/2014 Document Reviewed: 05/16/2014 Elsevier Interactive Patient Education 2016 [place] Maintenance, Female Adopting a healthy lifestyle and getting preventive care can go a long way to promote health and wellness. Talk with your health care provider about what schedule of regular examinations is right for you. This is a good chance for you to check in with your provider about disease prevention and staying healthy. In between checkups, there are plenty of things you can do on your own. Experts have done a lot of research about which lifestyle changes and preventive measures are most likely to keep you healthy. Ask your health care provider for more information. WEIGHT AND DIET  Eat a healthy diet  Be sure to include plenty of vegetables, fruits, low-fat dairy products, and lean protein.  Do not eat a lot of foods high in solid fats, added sugars, or salt.  Get regular exercise. This is  one of the most important things you can do for your health.  Most adults should exercise for at least 150 minutes each week. The exercise should increase your heart rate and make you sweat (moderate-intensity exercise).  Most adults should also do strengthening exercises at least twice a week. This is in addition to the moderate-intensity exercise.  Maintain a  healthy weight  Body mass index (BMI) is a measurement that can be used to identify possible weight problems. It estimates body fat based on height and weight. Your health care provider can help determine your BMI and help you achieve or maintain a healthy weight.  For females 60 years of age and older:   A BMI below 18.5 is considered underweight.  A BMI of 18.5 to 24.9 is normal.  A BMI of 25 to 29.9 is considered overweight.  A BMI of 30 and above is considered obese.  Watch levels of cholesterol and blood lipids  You should start having your blood tested for lipids and cholesterol at 71 years of age, then have this test every 5 years.  You may need to have your cholesterol levels checked more often if:  Your lipid or cholesterol levels are high.  You are older than 71 years of age.  You are at high risk for heart disease.  CANCER SCREENING   Lung Cancer  Lung cancer screening is recommended for adults 21-58 years old who are at high risk for lung cancer because of a history of smoking.  A yearly low-dose CT scan of the lungs is recommended for people who:  Currently smoke.  Have quit within the past 15 years.  Have at least a 30-pack-year history of smoking. A pack year is smoking an average of one pack of cigarettes a day for 1 year.  Yearly screening should continue until it has been 15 years since you quit.  Yearly screening should stop if you develop a health problem that would prevent you from having lung cancer treatment.  Breast Cancer  Practice breast self-awareness. This means understanding how your breasts normally appear and feel.  It also means doing regular breast self-exams. Let your health care provider know about any changes, no matter how small.  If you are in your 20s or 30s, you should have a clinical breast exam (CBE) by a health care provider every 1-3 years as part of a regular health exam.  If you are 32 or older, have a CBE every year.  Also consider having a breast X-ray (mammogram) every year.  If you have a family history of breast cancer, talk to your health care provider about genetic screening.  If you are at high risk for breast cancer, talk to your health care provider about having an MRI and a mammogram every year.  Breast cancer gene (BRCA) assessment is recommended for women who have family members with BRCA-related cancers. BRCA-related cancers include:  Breast.  Ovarian.  Tubal.  Peritoneal cancers.  Results of the assessment will determine the need for genetic counseling and BRCA1 and BRCA2 testing. Cervical Cancer Your health care provider may recommend that you be screened regularly for cancer of the pelvic organs (ovaries, uterus, and vagina). This screening involves a pelvic examination, including checking for microscopic changes to the surface of your cervix (Pap test). You may be encouraged to have this screening done every 3 years, beginning at age 68.  For women ages 37-65, health care providers may recommend pelvic exams and Pap testing every 3  years, or they may recommend the Pap and pelvic exam, combined with testing for human papilloma virus (HPV), every 5 years. Some types of HPV increase your risk of cervical cancer. Testing for HPV may also be done on women of any age with unclear Pap test results.  Other health care providers may not recommend any screening for nonpregnant women who are considered low risk for pelvic cancer and who do not have symptoms. Ask your health care provider if a screening pelvic exam is right for you.  If you have had past treatment for cervical cancer or a condition that could lead to cancer, you need Pap tests and screening for cancer for at least 20 years after your treatment. If Pap tests have been discontinued, your risk factors (such as having a new sexual partner) need to be reassessed to determine if screening should resume. Some women have medical problems that  increase the chance of getting cervical cancer. In these cases, your health care provider may recommend more frequent screening and Pap tests. Colorectal Cancer  This type of cancer can be detected and often prevented.  Routine colorectal cancer screening usually begins at 71 years of age and continues through 71 years of age.  Your health care provider may recommend screening at an earlier age if you have risk factors for colon cancer.  Your health care provider may also recommend using home test kits to check for hidden blood in the stool.  A small camera at the end of a tube can be used to examine your colon directly (sigmoidoscopy or colonoscopy). This is done to check for the earliest forms of colorectal cancer.  Routine screening usually begins at age 68.  Direct examination of the colon should be repeated every 5-10 years through 71 years of age. However, you may need to be screened more often if early forms of precancerous polyps or small growths are found. Skin Cancer  Check your skin from head to toe regularly.  Tell your health care provider about any new moles or changes in moles, especially if there is a change in a mole's shape or color.  Also tell your health care provider if you have a mole that is larger than the size of a pencil eraser.  Always use sunscreen. Apply sunscreen liberally and repeatedly throughout the day.  Protect yourself by wearing long sleeves, pants, a wide-brimmed hat, and sunglasses whenever you are outside. HEART DISEASE, DIABETES, AND HIGH BLOOD PRESSURE   High blood pressure causes heart disease and increases the risk of stroke. High blood pressure is more likely to develop in:  People who have blood pressure in the high end of the normal range (130-139/85-89 mm Hg).  People who are overweight or obese.  People who are African American.  If you are 66-19 years of age, have your blood pressure checked every 3-5 years. If you are 5 years of  age or older, have your blood pressure checked every year. You should have your blood pressure measured twice--once when you are at a hospital or clinic, and once when you are not at a hospital or clinic. Record the average of the two measurements. To check your blood pressure when you are not at a hospital or clinic, you can use:  An automated blood pressure machine at a pharmacy.  A home blood pressure monitor.  If you are between 30 years and 49 years old, ask your health care provider if you should take aspirin to prevent strokes.  Have regular diabetes screenings. This involves taking a blood sample to check your fasting blood sugar level.  If you are at a normal weight and have a low risk for diabetes, have this test once every three years after 71 years of age.  If you are overweight and have a high risk for diabetes, consider being tested at a younger age or more often. PREVENTING INFECTION  Hepatitis B  If you have a higher risk for hepatitis B, you should be screened for this virus. You are considered at high risk for hepatitis B if:  You were born in a country where hepatitis B is common. Ask your health care provider which countries are considered high risk.  Your parents were born in a high-risk country, and you have not been immunized against hepatitis B (hepatitis B vaccine).  You have HIV or AIDS.  You use needles to inject street drugs.  You live with someone who has hepatitis B.  You have had sex with someone who has hepatitis B.  You get hemodialysis treatment.  You take certain medicines for conditions, including cancer, organ transplantation, and autoimmune conditions. Hepatitis C  Blood testing is recommended for:  Everyone born from [birth year] through 1967.  Anyone with known risk factors for hepatitis C. Sexually transmitted infections (STIs)  You should be screened for sexually transmitted infections (STIs) including gonorrhea and chlamydia if:  You are  sexually active and are younger than 71 years of age.  You are older than 71 years of age and your health care provider tells you that you are at risk for this type of infection.  Your sexual activity has changed since you were last screened and you are at an increased risk for chlamydia or gonorrhea. Ask your health care provider if you are at risk.  If you do not have HIV, but are at risk, it may be recommended that you take a prescription medicine daily to prevent HIV infection. This is called pre-exposure prophylaxis (PrEP). You are considered at risk if:  You are sexually active and do not regularly use condoms or know the HIV status of your partner(s).  You take drugs by injection.  You are sexually active with a partner who has HIV. Talk with your health care provider about whether you are at high risk of being infected with HIV. If you choose to begin PrEP, you should first be tested for HIV. You should then be tested every 3 months for as long as you are taking PrEP.  PREGNANCY   If you are premenopausal and you may become pregnant, ask your health care provider about preconception counseling.  If you may become pregnant, take 400 to 800 micrograms (mcg) of folic acid every day.  If you want to prevent pregnancy, talk to your health care provider about birth control (contraception). OSTEOPOROSIS AND MENOPAUSE   Osteoporosis is a disease in which the bones lose minerals and strength with aging. This can result in serious bone fractures. Your risk for osteoporosis can be identified using a bone density scan.  If you are 32 years of age or older, or if you are at risk for osteoporosis and fractures, ask your health care provider if you should be screened.  Ask your health care provider whether you should take a calcium or vitamin D supplement to lower your risk for osteoporosis.  Menopause may have certain physical symptoms and risks.  Hormone replacement therapy may reduce some  of these symptoms and risks. Talk to  your health care provider about whether hormone replacement therapy is right for you.  HOME CARE INSTRUCTIONS   Schedule regular health, dental, and eye exams.  Stay current with your immunizations.   Do not use any tobacco products including cigarettes, chewing tobacco, or electronic cigarettes.  If you are pregnant, do not drink alcohol.  If you are breastfeeding, limit how much and how often you drink alcohol.  Limit alcohol intake to no more than 1 drink per day for nonpregnant women. One drink equals 12 ounces of beer, 5 ounces of wine, or 1 ounces of hard liquor.  Do not use street drugs.  Do not share needles.  Ask your health care provider for help if you need support or information about quitting drugs.  Tell your health care provider if you often feel depressed.  Tell your health care provider if you have ever been abused or do not feel safe at home.   This information is not intended to replace advice given to you by your health care provider. Make sure you discuss any questions you have with your health care provider.   Document Released: 06/21/2011 Document Revised: 12/27/2014 Document Reviewed: 11/07/2013 Elsevier Interactive Patient Education 2016 [employer].  Hearing Loss Hearing loss is a partial or total loss of the ability to hear. This can be temporary or permanent, and it can happen in one or both ears. Hearing loss may be referred to as deafness. Medical care is necessary to treat hearing loss properly and to prevent the condition from getting worse. Your hearing may partially or completely come back, depending on what caused your hearing loss and how severe it is. In some cases, hearing loss is permanent. CAUSES Common causes of hearing loss include:   Too much wax in the ear canal.   Infection of the ear canal or middle ear.   Fluid in the middle ear.   Injury to the ear or surrounding area.   An object  stuck in the ear.   Prolonged exposure to loud sounds, such as music.  Less common causes of hearing loss include:   Tumors in the ear.   Viral or bacterial infections, such as meningitis.   A hole in the eardrum (perforated eardrum).  Problems with the hearing nerve that sends signals between the brain and the ear.  Certain medicines.  SYMPTOMS  Symptoms of this condition may include:  Difficulty telling the difference between sounds.  Difficulty following a conversation when there is background noise.  Lack of response to sounds in your environment. This may be most noticeable when you do not respond to startling sounds.  Needing to turn up the volume on the television, radio, etc.  Ringing in the ears.  Dizziness.  Pain in the ears. DIAGNOSIS This condition is diagnosed based on a physical exam and a hearing test (audiometry). The audiometry test will be performed by a hearing specialist (audiologist). You may also be referred to an ear, nose, and throat (ENT) specialist (otolaryngologist).  TREATMENT Treatment for recent onset of hearing loss may include:   Ear wax removal.   Being prescribed medicines to prevent infection (antibiotics).   Being prescribed medicines to reduce inflammation (corticosteroids).  HOME CARE INSTRUCTIONS  If you were prescribed an antibiotic medicine, take it as told by your health care provider. Do not stop taking the antibiotic even if you start to feel better.  Take over-the-counter and prescription medicines only as told by your health care provider.  Avoid loud  noises.   Return to your normal activities as told by your health care provider. Ask your health care provider what activities are safe for you.  Keep all follow-up visits as told by your health care provider. This is important. SEEK MEDICAL CARE IF:   You feel dizzy.   You develop new symptoms.   You vomit or feel nauseous.   You have a fever.  SEEK  IMMEDIATE MEDICAL CARE IF:  You develop sudden changes in your vision.   You have severe ear pain.   You have new or increased weakness.  You have a severe headache.   This information is not intended to replace advice given to you by your health care provider. Make sure you discuss any questions you have with your health care provider.   Document Released: 12/06/2005 Document Revised: 08/27/2015 Document Reviewed: 04/23/2015 Elsevier Interactive Patient Education [employer].

## 2015-11-07 NOTE — Progress Notes (Signed)
Subjective:   Carla Ryan is a 69 y.o. female who presents for an Initial Medicare Annual Wellness Visit.  Review of Systems    No ROS.  Medicare Wellness Visit.   Cardiac Risk Factors include: advanced age (>59mn, >>25women);hypertension     Objective:    Today's Vitals   11/07/15 0904  BP: 128/72  Pulse: 67  Temp: 97.1 F (36.2 C)  TempSrc: Oral  Resp: 12  Height: 4' 11.5" (1.511 m)  Weight: 144 lb 12.8 oz (65.681 kg)  SpO2: 96%    Current Medications (verified) Outpatient Encounter Prescriptions as of 11/07/2015  Medication Sig  . aspirin 81 MG tablet Take 81 mg by mouth daily.  .Marland Kitchenazelastine (ASTELIN) 0.1 % nasal spray Place 2 sprays into both nostrils 2 (two) times daily. Use in each nostril as directed  . Calcium Carbonate-Vitamin D (CALCIUM 600 + D PO) Take 600 mg by mouth 2 (two) times daily.  . cetirizine (ZYRTEC) 10 MG tablet Take 10 mg by mouth as needed for allergies.  . cholecalciferol (VITAMIN D) 1000 UNITS tablet Take 2,000 Units by mouth daily.   .Marland KitchenCINNAMON PO Take by mouth 2 (two) times daily.  . fexofenadine (ALLEGRA) 180 MG tablet Take 180 mg by mouth as needed.   . fluticasone (FLONASE) 50 MCG/ACT nasal spray Place 2 sprays into both nostrils daily.  .Marland Kitchengabapentin (NEURONTIN) 100 MG capsule Take 2 capsules (200 mg total) by mouth at bedtime.  .Marland Kitchenletrozole (FEMARA) 2.5 MG tablet Take 2.5 mg by mouth daily.  . methocarbamol (ROBAXIN) 500 MG tablet Take 500 mg by mouth 2 (two) times daily as needed for muscle spasms.  . Multiple Vitamin (MULTIVITAMIN) tablet Take 1 tablet by mouth daily.  . niacin (NIASPAN) 1000 MG CR tablet Take 1,000 mg by mouth 2 (two) times daily.   . RABEprazole (ACIPHEX) 20 MG tablet Take 20 mg by mouth 2 (two) times daily.  . sucralfate (CARAFATE) 1 G tablet Take 1 g by mouth 2 (two) times daily.   .Marland Kitchentriamterene-hydrochlorothiazide (DYAZIDE) 37.5-25 MG capsule Take 1 each (1 capsule total) by mouth daily.  . Zoledronic Acid  (ZOMETA IV) Inject into the vein. Every 6 months   No facility-administered encounter medications on file as of 11/07/2015.    Allergies (verified) Review of patient's allergies indicates no known allergies.   History: Past Medical History  Diagnosis Date  . Hypertension   . GERD (gastroesophageal reflux disease)   . Hypercholesterolemia   . Osteoarthritis     scoliosis, degenerative disc dz, knee, carpal tunnel  . Anxiety     situational stress and anxiety  . Cancer ([facility] 2015    breast   . Abdominal hernia   . Hiatal hernia    Past Surgical History  Procedure Laterality Date  . Appendectomy  2000  . Abdominal hysterectomy  07/2000  . Carpal tunnel release    . Breast biopsy Bilateral 2014  . Breast surgery Right 2015    lumpectomy  . Colonoscopy  2011    MLoistine Simas [name]: Normal exam  . Upper gi endoscopy  05/10/2014    MLoistine Simas [name] Bile gastritis, medium hiatal hernia. Incomplete visualization of the cardia and fundus.  .Marland KitchenHernia repair  02/10/15  . Laparoscopic repair of lateral abdominal wall hernia  02/10/15   Family History  Problem Relation Age of Onset  . Diabetes Mother   . Diabetes Brother   . Diabetes Sister     x2  .  Hypertension Brother   . Multiple myeloma Mother   . Multiple myeloma Father    Social History   Occupational History  . Not on file.   Social History Main Topics  . Smoking status: Never Smoker   . Smokeless tobacco: Never Used  . Alcohol Use: 0.0 oz/week    0 Standard drinks or equivalent per week     Comment: wine occasionally  . Drug Use: No  . Sexual Activity: Yes    Tobacco Counseling Counseling given: Not Answered   Activities of Daily Living In your present state of health, do you have any difficulty performing the following activities: 11/07/2015 11/07/2015  Hearing? - N  Vision? - N  Difficulty concentrating or making decisions? - N  Walking or climbing stairs? (No Data) N  Dressing or bathing? - N    Doing errands, shopping? - N  Conservation officer, nature and eating ? - N  Using the Toilet? - N  In the past six months, have you accidently leaked urine? - N  Do you have problems with loss of bowel control? - N  Managing your Medications? - N  Managing your Finances? - N  Housekeeping or managing your Housekeeping? - N    Immunizations and Health Maintenance Immunization History  Administered Date(s) Administered  . Influenza Split 10/25/2012  . Influenza, High Dose Seasonal PF 11/07/2015  . Influenza,inj,Quad PF,36+ Mos 09/28/2013, 10/08/2014  . Pneumococcal Conjugate-13 11/27/2013  . Pneumococcal Polysaccharide-23 11/05/2015  . Tdap 12/20/2004  . Zoster 12/20/2008   Health Maintenance Due  Topic Date Due  . DEXA SCAN  02/03/2009    Patient Care Team: Einar Pheasant, MD as PCP - General (Internal Medicine) Einar Pheasant, MD (Internal Medicine) Robert Bellow, MD (General Surgery)  Indicate any recent Medical Services you may have received from other than [facility] providers in the past year (date may be approximate).     Assessment:   This is a routine wellness examination for Carla Ryan.  The goal of the wellness visit is to assist the patient how to close the gaps in care and create a preventative care plan for the patient.   Calcium/ VIT D as appropriate/Osteoporosis risk discussed.  Taking meds without issues; no barriers identified.   Hep C screening postponed until March, per patient request.    TDAP postponed, per patient request.  Follow up with insurance for coverage with Medicare plan D.  DEXA SCAN completed 1 year ago, per patient.  Follow up with documentation.  Safety issues reviewed; smoke detectors in the home. Firearms locked in a secure area. Wears seatbelts when driving or riding with others. No violence in the home.  No identified risk were noted; The patient was oriented x 3; appropriate in dress and manner and no objective failures at ADL's or IADL's.    Patient Concerns: L hand, middle finger intermittent joint stiffness.  Some swelling and redness at times. Deferred to PCP for follow up.     Hearing/Vision screen  Hearing Screening   Method: Audiometry   125Hz  250Hz  500Hz  1000Hz  2000Hz  4000Hz  8000Hz   Right ear:   Pass Pass Pass Pass   Left ear:   Pass Pass Pass Pass   Vision Screening Comments: Followed by [facility], Dr. Mordecai Rasmussen Last OV 07/2015 Wears glasses  Dietary issues and exercise activities discussed: Current Exercise Habits:: The patient does not participate in regular exercise at present  Goals    . Increase physical activity     Start walking 3  times a week for 30 minutes with husband.  During the cold months, walk the mall.  During the warmer months walk the neighborhood.      Depression Screen PHQ 2/9 Scores 11/07/2015 10/08/2014 05/31/2013 02/26/2013  PHQ - 2 Score 0 0 0 0    Fall Risk Fall Risk  11/07/2015 10/08/2014 05/31/2013 02/26/2013  Falls in the past year? No No No No    Cognitive Function: MMSE - Mini Mental State Exam 11/07/2015  Orientation to time 5  Orientation to Place 5  Registration 3  Attention/ Calculation 5  Recall 3  Language- name 2 objects 2  Language- repeat 1  Language- follow 3 step command 3  Language- read & follow direction 1  Write a sentence 1  Copy design 1  Total score 30    Screening Tests Health Maintenance  Topic Date Due  . DEXA SCAN  02/03/2009  . Hepatitis C Screening  02/18/2016 (Originally 10-23-1946)  . TETANUS/TDAP  11/08/2016 (Originally 12/20/2014)  . MAMMOGRAM  04/04/2016  . INFLUENZA VACCINE  07/20/2016  . COLONOSCOPY  10/23/2020  . ZOSTAVAX  Completed  . PNA vac Low Risk Adult  Completed      Plan:   End of life planning was discussed; aging in home or other; Advanced aging; Plans to bring a copy of already completed HCPOA/Living Will forms.  Return in 4 months for follow up with PCP. Return in 1 year for medical annual wellness  with [facility].   During the course of the visit, Carla Ryan was educated and counseled about the following appropriate screening and preventive services:   Vaccines to include Pneumoccal, Influenza, Hepatitis B, Td, Zostavax, HCV  Electrocardiogram  Cardiovascular disease screening  Colorectal cancer screening  Bone density screening  Diabetes screening  Glaucoma screening  Mammography/PAP  Nutrition counseling  Smoking cessation counseling  Patient Instructions (the written plan) were given to the patient.    Varney Biles, LPN   [id number]    Reviewed above information.  Agree with plan.   Dr Nicki Reaper

## 2015-11-15 ENCOUNTER — Encounter: Payer: Self-pay | Admitting: Internal Medicine

## 2015-11-15 NOTE — Assessment & Plan Note (Signed)
Had cardiac w/up recently.  Negative.  Discussed further evaluation with her.  She declines.  Will follow.  Notify me if change.

## 2015-11-15 NOTE — Assessment & Plan Note (Signed)
Using CPAP 

## 2015-11-15 NOTE — Assessment & Plan Note (Signed)
EGD 05/10/14 - irregular z-line, bile gastritis and hiatal hernia.  Upper symptoms controlled.

## 2015-11-15 NOTE — Assessment & Plan Note (Signed)
Blood pressure under good control.  Continue same medication regimen.  Follow pressures.  Follow metabolic panel.   

## 2015-11-15 NOTE — Assessment & Plan Note (Signed)
Being followed at [facility].  Seeing Dr Stasia Cavalier and Dr Harden Mo.  Mammogram 01/05/15 - ok.  Due f/u in one year.

## 2015-11-15 NOTE — Assessment & Plan Note (Signed)
Low cholesterol diet and exercise.  Follow lipid panel.   

## 2015-11-15 NOTE — Assessment & Plan Note (Signed)
Uses flonase and astelin.  Follow.

## 2015-11-15 NOTE — Assessment & Plan Note (Signed)
Low carb diet and exercise.  Follow met b and a1c.

## 2015-12-31 LAB — HM MAMMOGRAPHY

## 2016-03-04 ENCOUNTER — Encounter: Payer: Self-pay | Admitting: Internal Medicine

## 2016-03-04 ENCOUNTER — Ambulatory Visit (INDEPENDENT_AMBULATORY_CARE_PROVIDER_SITE_OTHER): Payer: Medicare Other | Admitting: Internal Medicine

## 2016-03-04 VITALS — BP 140/68 | HR 66 | Temp 97.9°F | Resp 18 | Ht 59.5 in | Wt 144.3 lb

## 2016-03-04 DIAGNOSIS — G4733 Obstructive sleep apnea (adult) (pediatric): Secondary | ICD-10-CM

## 2016-03-04 DIAGNOSIS — R079 Chest pain, unspecified: Secondary | ICD-10-CM | POA: Diagnosis not present

## 2016-03-04 DIAGNOSIS — C50919 Malignant neoplasm of unspecified site of unspecified female breast: Secondary | ICD-10-CM

## 2016-03-04 DIAGNOSIS — R0602 Shortness of breath: Secondary | ICD-10-CM

## 2016-03-04 DIAGNOSIS — I10 Essential (primary) hypertension: Secondary | ICD-10-CM | POA: Diagnosis not present

## 2016-03-04 DIAGNOSIS — K219 Gastro-esophageal reflux disease without esophagitis: Secondary | ICD-10-CM | POA: Diagnosis not present

## 2016-03-04 DIAGNOSIS — R739 Hyperglycemia, unspecified: Secondary | ICD-10-CM | POA: Diagnosis not present

## 2016-03-04 DIAGNOSIS — E7800 Pure hypercholesterolemia, unspecified: Secondary | ICD-10-CM | POA: Diagnosis not present

## 2016-03-04 LAB — CBC WITH DIFFERENTIAL/PLATELET
BASOS ABS: 0 10*3/uL (ref 0.0–0.1)
BASOS PCT: 0.4 % (ref 0.0–3.0)
EOS ABS: 0.2 10*3/uL (ref 0.0–0.7)
Eosinophils Relative: 2.8 % (ref 0.0–5.0)
HCT: 42 % (ref 36.0–46.0)
Hemoglobin: 14.3 g/dL (ref 12.0–15.0)
LYMPHS ABS: 1.4 10*3/uL (ref 0.7–4.0)
Lymphocytes Relative: 24.7 % (ref 12.0–46.0)
MCHC: 33.9 g/dL (ref 30.0–36.0)
MCV: 89.5 fl (ref 78.0–100.0)
Monocytes Absolute: 0.4 10*3/uL (ref 0.1–1.0)
Monocytes Relative: 7.7 % (ref 3.0–12.0)
NEUTROS ABS: 3.8 10*3/uL (ref 1.4–7.7)
NEUTROS PCT: 64.4 % (ref 43.0–77.0)
PLATELETS: 229 10*3/uL (ref 150.0–400.0)
RBC: 4.7 Mil/uL (ref 3.87–5.11)
RDW: 15.3 % (ref 11.5–15.5)
WBC: 5.8 10*3/uL (ref 4.0–10.5)

## 2016-03-04 LAB — HEMOGLOBIN A1C: Hgb A1c MFr Bld: 6.2 % (ref 4.6–6.5)

## 2016-03-04 LAB — BASIC METABOLIC PANEL
BUN: 22 mg/dL (ref 6–23)
CO2: 29 meq/L (ref 19–32)
Calcium: 10.1 mg/dL (ref 8.4–10.5)
Chloride: 102 mEq/L (ref 96–112)
Creatinine, Ser: 0.88 mg/dL (ref 0.40–1.20)
GFR: 67.12 mL/min (ref >60.00)
GLUCOSE: 107 mg/dL — AB (ref 70–99)
POTASSIUM: 3.5 meq/L (ref 3.5–5.1)
SODIUM: 138 meq/L (ref 135–145)

## 2016-03-04 LAB — LIPID PANEL
CHOL/HDL RATIO: 3
CHOLESTEROL: 222 mg/dL — AB (ref 0–200)
HDL: 65.4 mg/dL (ref >39.00)
LDL CALC: 132 mg/dL — AB (ref 0–99)
NonHDL: 156.58
TRIGLYCERIDES: 124 mg/dL (ref 0.0–149.0)
VLDL: 24.8 mg/dL (ref 0.0–40.0)

## 2016-03-04 LAB — HEPATIC FUNCTION PANEL
ALK PHOS: 44 U/L (ref 39–117)
ALT: 13 U/L (ref 0–35)
AST: 16 U/L (ref 0–37)
Albumin: 4.2 g/dL (ref 3.5–5.2)
BILIRUBIN DIRECT: 0 mg/dL (ref 0.0–0.3)
BILIRUBIN TOTAL: 0.5 mg/dL (ref 0.2–1.2)
TOTAL PROTEIN: 7.1 g/dL (ref 6.0–8.3)

## 2016-03-04 LAB — TSH: TSH: 1.74 u[IU]/mL (ref 0.35–4.50)

## 2016-03-04 NOTE — Progress Notes (Signed)
Patient ID: Carla Ryan, female   DOB: 11-10-1946, 70 y.o.   MRN: [id number]   Subjective:    Patient ID: Carla Ryan, female    DOB: 1945-12-30, 70 y.o.   MRN: [id number]  HPI  Patient here for a scheduled follow up.  Reports increased acid reflux.  Has seen GI.  On aciphex and carafate.  Still has break through symptoms.  Is eating.  Feels hot water coming up in her mouth.  She also report noticing some increased sob with exertion.  Has been waking up jittery.  Has been sleeping in a recliner.  Not sleeping as well.  Some chest discomfort previously.  No abdominal pain or cramping.  Bowels stable.  Feels handling stress relatively well.     Past Medical History  Diagnosis Date  . Hypertension   . GERD (gastroesophageal reflux disease)   . Hypercholesterolemia   . Osteoarthritis     scoliosis, degenerative disc dz, knee, carpal tunnel  . Anxiety     situational stress and anxiety  . Cancer (HCC) 2015    breast   . Abdominal hernia   . Hiatal hernia    Past Surgical History  Procedure Laterality Date  . Appendectomy  2000  . Abdominal hysterectomy  07/2000  . Carpal tunnel release    . Breast biopsy Bilateral 2014  . Breast surgery Right 2015    lumpectomy  . Colonoscopy  2011    Barnetta Chapel, [name]: Normal exam  . Upper gi endoscopy  05/10/2014    Barnetta Chapel, [name] Bile gastritis, medium hiatal hernia. Incomplete visualization of the cardia and fundus.  Marland Kitchen Hernia repair  02/10/15  . Laparoscopic repair of lateral abdominal wall hernia  02/10/15   Family History  Problem Relation Age of Onset  . Diabetes Mother   . Diabetes Brother   . Diabetes Sister     x2  . Hypertension Brother   . Multiple myeloma Mother   . Multiple myeloma Father    Social History   Social History  . Marital Status: Married    Spouse Name: N/A  . Number of Children: 2  . Years of Education: N/A   Social History Main Topics  . Smoking status: Never Smoker   . Smokeless  tobacco: Never Used  . Alcohol Use: 0.0 oz/week    0 Standard drinks or equivalent per week     Comment: wine occasionally  . Drug Use: No  . Sexual Activity: Yes   Other Topics Concern  . None   Social History Narrative    Outpatient Encounter Prescriptions as of 03/04/2016  Medication Sig  . anastrozole (ARIMIDEX) 1 MG tablet Take 1 mg by mouth daily.   Marland Kitchen aspirin 81 MG tablet Take 81 mg by mouth daily.  Marland Kitchen azelastine (ASTELIN) 0.1 % nasal spray Place 2 sprays into both nostrils 2 (two) times daily. Use in each nostril as directed  . Calcium Carbonate-Vitamin D (CALCIUM 600 + D PO) Take 600 mg by mouth 2 (two) times daily.  . cetirizine (ZYRTEC) 10 MG tablet Take 10 mg by mouth as needed for allergies.  . cholecalciferol (VITAMIN D) 1000 UNITS tablet Take 2,000 Units by mouth daily.   Marland Kitchen CINNAMON PO Take by mouth 2 (two) times daily.  . fexofenadine (ALLEGRA) 180 MG tablet Take 180 mg by mouth as needed.   . fluticasone (FLONASE) 50 MCG/ACT nasal spray Place 2 sprays into both nostrils daily.  Marland Kitchen gabapentin (NEURONTIN) 100 MG capsule  Take 2 capsules (200 mg total) by mouth at bedtime.  . methocarbamol (ROBAXIN) 500 MG tablet Take 500 mg by mouth 2 (two) times daily as needed for muscle spasms.  . Misc Natural Products (TART CHERRY ADVANCED) CAPS Take 1 capsule by mouth 2 (two) times daily. Reported on 03/04/2016  . Multiple Vitamin (MULTIVITAMIN) tablet Take 1 tablet by mouth daily.  . niacin (NIASPAN) 1000 MG CR tablet Take 1,000 mg by mouth 2 (two) times daily.   . RABEprazole (ACIPHEX) 20 MG tablet Take 20 mg by mouth 2 (two) times daily.  . sucralfate (CARAFATE) 1 G tablet Take 1 g by mouth 2 (two) times daily.   Marland Kitchen triamterene-hydrochlorothiazide (DYAZIDE) 37.5-25 MG capsule Take 1 each (1 capsule total) by mouth daily.  . Zoledronic Acid (ZOMETA IV) Inject into the vein. Every 6 months  . [DISCONTINUED] letrozole (FEMARA) 2.5 MG tablet Take 2.5 mg by mouth daily.   No  facility-administered encounter medications on file as of 03/04/2016.    Review of Systems  Constitutional: Negative for fever, appetite change and unexpected weight change.  HENT: Negative for congestion and sinus pressure.   Respiratory: Positive for shortness of breath. Negative for cough and chest tightness.   Cardiovascular: Positive for chest pain. Negative for palpitations and leg swelling.  Gastrointestinal: Negative for nausea, vomiting, abdominal pain and diarrhea.       Increased acid reflux as outlined.    Genitourinary: Negative for dysuria and difficulty urinating.  Musculoskeletal: Negative for myalgias and joint swelling.  Skin: Negative for color change and rash.  Neurological: Negative for dizziness, light-headedness and headaches.  Psychiatric/Behavioral: Negative for dysphoric mood and agitation.       Objective:    Physical Exam  Constitutional: She appears well-developed and well-nourished. No distress.  HENT:  Nose: Nose normal.  Mouth/Throat: Oropharynx is clear and moist.  Neck: Neck supple. No thyromegaly present.  Cardiovascular: Normal rate and regular rhythm.   Pulmonary/Chest: Breath sounds normal. No respiratory distress. She has no wheezes.  Abdominal: Soft. Bowel sounds are normal. There is no tenderness.  Musculoskeletal: She exhibits no edema or tenderness.  Lymphadenopathy:    She has no cervical adenopathy.  Skin: No rash noted. No erythema.  Psychiatric: She has a normal mood and affect. Her behavior is normal.    BP 140/68 mmHg  Pulse 66  Temp(Src) 97.9 F (36.6 C) (Oral)  Resp 18  Ht 4' 11.5" (1.511 m)  Wt 144 lb 5.6 oz (65.477 kg)  BMI 28.68 kg/m2  SpO2 96% Wt Readings from Last 3 Encounters:  03/04/16 144 lb 5.6 oz (65.477 kg)  11/07/15 144 lb 12.8 oz (65.681 kg)  11/05/15 145 lb (65.772 kg)     Lab Results  Component Value Date   WBC 5.4 08/05/2015   HGB 13.7 08/05/2015   HCT 40.8 08/05/2015   PLT 233.0 08/05/2015    GLUCOSE 94 11/05/2015   CHOL 224* 11/05/2015   TRIG 105.0 11/05/2015   HDL 66.80 11/05/2015   LDLDIRECT 146.9 01/31/2014   LDLCALC 136* 11/05/2015   ALT 15 11/05/2015   AST 17 11/05/2015   NA 139 11/05/2015   K 3.9 11/05/2015   CL 102 11/05/2015   CREATININE 0.82 11/05/2015   BUN 19 11/05/2015   CO2 31 11/05/2015   TSH 1.49 04/15/2015   HGBA1C 6.0 11/05/2015       Assessment & Plan:   Problem List Items Addressed This Visit    None  Einar Pheasant, MD

## 2016-03-04 NOTE — Progress Notes (Signed)
Pre visit review using our clinic review tool, if applicable. No additional management support is needed unless otherwise documented below in the visit note. 

## 2016-03-06 ENCOUNTER — Other Ambulatory Visit: Payer: Self-pay | Admitting: Internal Medicine

## 2016-03-06 DIAGNOSIS — E7800 Pure hypercholesterolemia, unspecified: Secondary | ICD-10-CM

## 2016-03-06 MED ORDER — ROSUVASTATIN CALCIUM 5 MG PO TABS: 5.0000 mg | ORAL_TABLET | Freq: Every day | ORAL | Status: DC

## 2016-03-06 NOTE — Progress Notes (Signed)
rx sent in for crestor 5mg  #30 with one refill.  Also order placed for liver panel

## 2016-03-07 ENCOUNTER — Encounter: Payer: Self-pay | Admitting: Internal Medicine

## 2016-03-07 NOTE — Assessment & Plan Note (Signed)
Has severe sleep apnea.  Using cpap.  Waking up at night.  Need to confirm correct settings.  Set up for autotitration.

## 2016-03-07 NOTE — Assessment & Plan Note (Signed)
Being followed at [facility].  Seeing Dr Stasia Cavalier and Dr Harden Mo.  Mammogram 12/2015.  Due f/u in 05/2016.

## 2016-03-07 NOTE — Assessment & Plan Note (Signed)
Low carb diet and exercise.  Follow met b and a1c.  

## 2016-03-07 NOTE — Assessment & Plan Note (Signed)
Had cardiac w/up one year ago.  With sob with exertion.  Some chest discomfort.  EKG obtained today and revealed SR with TWI v3 and v4.  Discussed with cardiology.  Will see pt today for evaluation and question of need for any further w/up.

## 2016-03-07 NOTE — Assessment & Plan Note (Signed)
EGD in 2015 as outlined.  On aciphex and carafate.  Persistent symptoms.  Refer to GI.  Increased carafate to at least tid.  Follow.

## 2016-03-07 NOTE — Assessment & Plan Note (Signed)
Low cholesterol diet and exercise.  Follow lipid panel.   

## 2016-03-07 NOTE — Assessment & Plan Note (Signed)
Blood pressure under reasonable control.  Same medication regimen.  Follow pressures.  Follow metabolic panel.   

## 2016-03-11 ENCOUNTER — Encounter: Payer: Self-pay | Admitting: Internal Medicine

## 2016-03-12 NOTE — Telephone Encounter (Signed)
See attached my chart message.  This is the patient we discussed.  I thought this was changed to auto titration.  Please notify pt.  Let me know if I need to do anything.

## 2016-04-13 ENCOUNTER — Encounter: Payer: Self-pay | Admitting: Internal Medicine

## 2016-04-13 DIAGNOSIS — Z1159 Encounter for screening for other viral diseases: Secondary | ICD-10-CM

## 2016-04-13 DIAGNOSIS — Z79899 Other long term (current) drug therapy: Secondary | ICD-10-CM

## 2016-04-14 NOTE — Telephone Encounter (Signed)
Orders placed for labs

## 2016-04-16 ENCOUNTER — Other Ambulatory Visit (INDEPENDENT_AMBULATORY_CARE_PROVIDER_SITE_OTHER): Payer: Medicare Other

## 2016-04-16 DIAGNOSIS — E7800 Pure hypercholesterolemia, unspecified: Secondary | ICD-10-CM

## 2016-04-16 DIAGNOSIS — Z79899 Other long term (current) drug therapy: Secondary | ICD-10-CM | POA: Diagnosis not present

## 2016-04-16 DIAGNOSIS — Z1159 Encounter for screening for other viral diseases: Secondary | ICD-10-CM

## 2016-04-16 LAB — HEPATIC FUNCTION PANEL
ALBUMIN: 4.1 g/dL (ref 3.5–5.2)
ALK PHOS: 43 U/L (ref 39–117)
ALT: 13 U/L (ref 0–35)
AST: 16 U/L (ref 0–37)
Bilirubin, Direct: 0.1 mg/dL (ref 0.0–0.3)
TOTAL PROTEIN: 6.7 g/dL (ref 6.0–8.3)
Total Bilirubin: 0.5 mg/dL (ref 0.2–1.2)

## 2016-04-16 LAB — MAGNESIUM: Magnesium: 2.1 mg/dL (ref 1.5–2.5)

## 2016-04-17 ENCOUNTER — Encounter: Payer: Self-pay | Admitting: Internal Medicine

## 2016-04-17 LAB — HEPATITIS C ANTIBODY: HCV Ab: NEGATIVE

## 2016-04-23 ENCOUNTER — Encounter: Payer: Self-pay | Admitting: Internal Medicine

## 2016-04-23 MED ORDER — ROSUVASTATIN CALCIUM 5 MG PO TABS: 5.0000 mg | ORAL_TABLET | Freq: Every day | ORAL | Status: DC

## 2016-04-23 NOTE — Telephone Encounter (Signed)
rx sent in for crestor #90 with one refill to mail order per pt request.  Pt notified via my chart.

## 2016-05-24 ENCOUNTER — Ambulatory Visit: Payer: Medicare Other

## 2016-06-01 ENCOUNTER — Ambulatory Visit: Payer: Medicare Other | Admitting: Internal Medicine

## 2016-06-01 ENCOUNTER — Encounter: Payer: Self-pay | Admitting: Internal Medicine

## 2016-06-01 ENCOUNTER — Ambulatory Visit (INDEPENDENT_AMBULATORY_CARE_PROVIDER_SITE_OTHER): Payer: Medicare Other | Admitting: Internal Medicine

## 2016-06-01 VITALS — BP 120/60 | HR 76 | Temp 98.4°F | Resp 17 | Ht 59.5 in | Wt 145.1 lb

## 2016-06-01 DIAGNOSIS — K219 Gastro-esophageal reflux disease without esophagitis: Secondary | ICD-10-CM

## 2016-06-01 DIAGNOSIS — I10 Essential (primary) hypertension: Secondary | ICD-10-CM

## 2016-06-01 DIAGNOSIS — M2550 Pain in unspecified joint: Secondary | ICD-10-CM

## 2016-06-01 DIAGNOSIS — G4733 Obstructive sleep apnea (adult) (pediatric): Secondary | ICD-10-CM | POA: Diagnosis not present

## 2016-06-01 DIAGNOSIS — C50919 Malignant neoplasm of unspecified site of unspecified female breast: Secondary | ICD-10-CM

## 2016-06-01 DIAGNOSIS — R739 Hyperglycemia, unspecified: Secondary | ICD-10-CM

## 2016-06-01 DIAGNOSIS — E7800 Pure hypercholesterolemia, unspecified: Secondary | ICD-10-CM

## 2016-06-01 NOTE — Progress Notes (Signed)
Pre-visit discussion using our clinic review tool. No additional management support is needed unless otherwise documented below in the visit note.  

## 2016-06-01 NOTE — Progress Notes (Signed)
Patient ID: Carla Ryan, female   DOB: [birth year], 70 y.o.   MRN: [id number]   Subjective:    Patient ID: Carla Ryan, female    DOB: 02/01/1946, 70 y.o.   MRN: [id number]  HPI  Patient here for a scheduled follow up.  She received zometa yesterday.  Being followed by oncology.  She tries to stay active.  No chest pain.  Breathing overall stable.  Occasional nausea.  Pt reports she does not feel this is significant and desires no further w/up for this.  Does report increased joint stiffness and discomfort.  Pain in her hands and stiffness in her hands.  Eating and drinking.  Bowels stable.     Past Medical History  Diagnosis Date  . Hypertension   . GERD (gastroesophageal reflux disease)   . Hypercholesterolemia   . Osteoarthritis     scoliosis, degenerative disc dz, knee, carpal tunnel  . Anxiety     situational stress and anxiety  . Cancer ([place]) 2015    breast   . Abdominal hernia   . Hiatal hernia    Past Surgical History  Procedure Laterality Date  . Appendectomy  2000  . Abdominal hysterectomy  07/2000  . Carpal tunnel release    . Breast biopsy Bilateral 2014  . Breast surgery Right 2015    lumpectomy  . Colonoscopy  2011    [name], [name]: Normal exam  . Upper gi endoscopy  05/10/2014    [name], [name] Bile gastritis, medium hiatal hernia. Incomplete visualization of the cardia and fundus.  Marland Kitchen Hernia repair  02/10/15  . Laparoscopic repair of lateral abdominal wall hernia  02/10/15   Family History  Problem Relation Age of Onset  . Diabetes Mother   . Diabetes Brother   . Diabetes Sister     x2  . Hypertension Brother   . Multiple myeloma Mother   . Multiple myeloma Father    Social History   Social History  . Marital Status: Married    Spouse Name: N/A  . Number of Children: 2  . Years of Education: N/A   Social History Main Topics  . Smoking status: Never Smoker   . Smokeless tobacco: Never Used  . Alcohol Use: 0.0 oz/week    0  Standard drinks or equivalent per week     Comment: wine occasionally  . Drug Use: No  . Sexual Activity: Yes   Other Topics Concern  . None   Social History Narrative    Outpatient Encounter Prescriptions as of 06/01/2016  Medication Sig  . anastrozole (ARIMIDEX) 1 MG tablet Take 1 mg by mouth daily.   Marland Kitchen aspirin 81 MG tablet Take 81 mg by mouth daily.  Marland Kitchen azelastine (ASTELIN) 0.1 % nasal spray Place 2 sprays into both nostrils 2 (two) times daily. Use in each nostril as directed  . Calcium Carbonate-Vitamin D (CALCIUM 600 + D PO) Take 600 mg by mouth 2 (two) times daily.  . cetirizine (ZYRTEC) 10 MG tablet Take 10 mg by mouth as needed for allergies.  . cholecalciferol (VITAMIN D) 1000 UNITS tablet Take 2,000 Units by mouth daily.   Marland Kitchen CINNAMON PO Take by mouth 2 (two) times daily.  . fexofenadine (ALLEGRA) 180 MG tablet Take 180 mg by mouth as needed.   . fluticasone (FLONASE) 50 MCG/ACT nasal spray Place 2 sprays into both nostrils daily.  Marland Kitchen gabapentin (NEURONTIN) 100 MG capsule Take 2 capsules (200 mg total) by mouth at bedtime.  Marland Kitchen  Misc Natural Products (TART CHERRY ADVANCED) CAPS Take 1 capsule by mouth 2 (two) times daily. Reported on 03/04/2016  . Multiple Vitamin (MULTIVITAMIN) tablet Take 1 tablet by mouth daily.  . niacin (NIASPAN) 1000 MG CR tablet Take 1,000 mg by mouth 2 (two) times daily.   . RABEprazole (ACIPHEX) 20 MG tablet Take 20 mg by mouth 2 (two) times daily.  . rosuvastatin (CRESTOR) 5 MG tablet Take 1 tablet (5 mg total) by mouth daily.  . sucralfate (CARAFATE) 1 G tablet Take 1 g by mouth 2 (two) times daily.   Marland Kitchen triamterene-hydrochlorothiazide (DYAZIDE) 37.5-25 MG capsule Take 1 each (1 capsule total) by mouth daily.  . Zoledronic Acid (ZOMETA IV) Inject into the vein. Every 6 months  . [DISCONTINUED] methocarbamol (ROBAXIN) 500 MG tablet Take 500 mg by mouth 2 (two) times daily as needed for muscle spasms.   No facility-administered encounter medications on  file as of 06/01/2016.    Review of Systems  Constitutional: Negative for appetite change and unexpected weight change.  HENT: Negative for congestion and sinus pressure.   Respiratory: Negative for cough, chest tightness and shortness of breath.   Cardiovascular: Negative for chest pain, palpitations and leg swelling.  Gastrointestinal: Positive for nausea. Negative for vomiting, abdominal pain and diarrhea.  Genitourinary: Negative for dysuria and difficulty urinating.  Musculoskeletal:       Joint stiffness and pain as outlined.    Skin: Negative for color change and rash.  Neurological: Negative for dizziness, light-headedness and headaches.  Psychiatric/Behavioral: Negative for dysphoric mood and agitation.       Objective:    Physical Exam  Constitutional: She appears well-developed and well-nourished. No distress.  HENT:  Nose: Nose normal.  Mouth/Throat: Oropharynx is clear and moist.  Neck: Neck supple. No thyromegaly present.  Cardiovascular: Normal rate and regular rhythm.   Pulmonary/Chest: Breath sounds normal. No respiratory distress. She has no wheezes.  Abdominal: Soft. Bowel sounds are normal. There is no tenderness.  Musculoskeletal: She exhibits no edema or tenderness.  Lymphadenopathy:    She has no cervical adenopathy.  Skin: No rash noted. No erythema.  Psychiatric: She has a normal mood and affect. Her behavior is normal.    BP 120/60 mmHg  Pulse 76  Temp(Src) 98.4 F (36.9 C) (Oral)  Resp 17  Ht 4' 11.5" (1.511 m)  Wt 145 lb 2 oz (65.828 kg)  BMI 28.83 kg/m2  SpO2 95% Wt Readings from Last 3 Encounters:  06/01/16 145 lb 2 oz (65.828 kg)  03/04/16 144 lb 5.6 oz (65.477 kg)  11/07/15 144 lb 12.8 oz (65.681 kg)     Lab Results  Component Value Date   WBC 5.8 03/04/2016   HGB 14.3 03/04/2016   HCT 42.0 03/04/2016   PLT 229.0 03/04/2016   GLUCOSE 107* 03/04/2016   CHOL 222* 03/04/2016   TRIG 124.0 03/04/2016   HDL 65.40 03/04/2016    LDLDIRECT 146.9 01/31/2014   LDLCALC 132* 03/04/2016   ALT 13 04/16/2016   AST 16 04/16/2016   NA 138 03/04/2016   K 3.5 03/04/2016   CL 102 03/04/2016   CREATININE 0.88 03/04/2016   BUN 22 03/04/2016   CO2 29 03/04/2016   TSH 1.74 03/04/2016   HGBA1C 6.2 03/04/2016       Assessment & Plan:   Problem List Items Addressed This Visit    Breast cancer ([place])    Followed at [facility].  Seeing Dr Stasia Cavalier and Dr Harden Mo.  mammogam 12/2015.  Due  05/2016.  On arimidex.        GERD (gastroesophageal reflux disease)    EGD as outlined.  Occasional nausea.  No acid reflux.  Discussed further change in treatment or w/up.  She declines.  Does not feel is significant.        Hypercholesterolemia    On crestor.  Low cholesterol diet and exercise.  Follow lipid panel and liver function tests.        Relevant Orders   Lipid panel   Hepatic function panel   Hyperglycemia    Low carb diet and exercise.  Follow met b and a1c.       Relevant Orders   Hemoglobin A1c   Hypertension - Primary    Blood pressure under good control.  Continue same medication regimen.  Follow pressures.  Follow metabolic panel.        Relevant Orders   Basic metabolic panel   Joint pain    Joint pain as outlined.  Check esr and rheum panel.  Question if arimidex contributing.  Exercise and stretches.  She feels the aching may have worsened since starting crestor.  Wants to continue for now.  Follow.        Relevant Orders   Rheumatoid factor   ANA   Sedimentation rate   C-reactive protein   Obstructive sleep apnea    Using CPAP.           Einar Pheasant, MD

## 2016-06-08 ENCOUNTER — Encounter: Payer: Self-pay | Admitting: Internal Medicine

## 2016-06-08 DIAGNOSIS — M2550 Pain in unspecified joint: Secondary | ICD-10-CM | POA: Insufficient documentation

## 2016-06-08 NOTE — Assessment & Plan Note (Addendum)
Joint pain as outlined.  Check esr and rheum panel.  Question if arimidex contributing.  Exercise and stretches.  She feels the aching may have worsened since starting crestor.  Wants to continue for now.  Follow.   

## 2016-06-08 NOTE — Assessment & Plan Note (Signed)
On crestor.  Low cholesterol diet and exercise.  Follow lipid panel and liver function tests.   

## 2016-06-08 NOTE — Assessment & Plan Note (Signed)
EGD as outlined.  Occasional nausea.  No acid reflux.  Discussed further change in treatment or w/up.  She declines.  Does not feel is significant.

## 2016-06-08 NOTE — Assessment & Plan Note (Signed)
Followed at [facility].  Seeing Dr Stasia Cavalier and Dr Harden Mo.  mammogam 12/2015.  Due 05/2016.  On arimidex.

## 2016-06-08 NOTE — Assessment & Plan Note (Signed)
Using CPAP 

## 2016-06-08 NOTE — Assessment & Plan Note (Signed)
Blood pressure under good control.  Continue same medication regimen.  Follow pressures.  Follow metabolic panel.   

## 2016-06-08 NOTE — Assessment & Plan Note (Signed)
Low carb diet and exercise.  Follow met b and a1c.  

## 2016-06-17 ENCOUNTER — Ambulatory Visit: Payer: Medicare Other

## 2016-06-17 NOTE — Patient Instructions (Signed)
  Carla Ryan , Thank you for taking time to come for your Medicare Wellness Visit. I appreciate your ongoing commitment to your health goals. Please review the following plan we discussed and let me know if I can assist you in the future.   These are the goals we discussed: Goals    . Increase physical activity     Start walking 3 times a week for 30 minutes with husband.  During the cold months, walk the mall.  During the warmer months walk the neighborhood.       This is a list of the screening recommended for you and due dates:  Health Maintenance  Topic Date Due  . DEXA scan (bone density measurement)  02/03/2009  . Flu Shot  07/20/2016  . Mammogram  12/30/2017  . Colon Cancer Screening  10/23/2020  . Tetanus Vaccine  12/03/2025  . Shingles Vaccine  Completed  .  Hepatitis C: One time screening is recommended by Center for Disease Control  (CDC) for  adults born from [birth year] through 1967.   Completed  . Pneumonia vaccines  Completed

## 2016-06-29 NOTE — Progress Notes (Signed)
ERROR in opening chart.

## 2016-07-21 ENCOUNTER — Other Ambulatory Visit: Payer: Self-pay | Admitting: *Deleted

## 2016-07-21 MED ORDER — AZELASTINE HCL 0.1 % NA SOLN: 2.0000 | Freq: Two times a day (BID) | NASAL | 3 refills | Status: DC

## 2016-07-21 MED ORDER — FLUTICASONE PROPIONATE 50 MCG/ACT NA SUSP: 2.0000 | Freq: Every day | NASAL | 3 refills | Status: DC

## 2016-08-04 ENCOUNTER — Encounter: Payer: Self-pay | Admitting: Internal Medicine

## 2016-08-11 ENCOUNTER — Other Ambulatory Visit (INDEPENDENT_AMBULATORY_CARE_PROVIDER_SITE_OTHER): Payer: Medicare Other

## 2016-08-11 DIAGNOSIS — R739 Hyperglycemia, unspecified: Secondary | ICD-10-CM

## 2016-08-11 DIAGNOSIS — I10 Essential (primary) hypertension: Secondary | ICD-10-CM | POA: Diagnosis not present

## 2016-08-11 DIAGNOSIS — E7800 Pure hypercholesterolemia, unspecified: Secondary | ICD-10-CM

## 2016-08-11 DIAGNOSIS — M2550 Pain in unspecified joint: Secondary | ICD-10-CM

## 2016-08-11 LAB — BASIC METABOLIC PANEL
BUN: 25 mg/dL — AB (ref 6–23)
CALCIUM: 9.5 mg/dL (ref 8.4–10.5)
CHLORIDE: 102 meq/L (ref 96–112)
CO2: 29 mEq/L (ref 19–32)
CREATININE: 0.87 mg/dL (ref 0.40–1.20)
GFR: 67.93 mL/min (ref >60.00)
Glucose, Bld: 101 mg/dL — ABNORMAL HIGH (ref 70–99)
Potassium: 3.6 mEq/L (ref 3.5–5.1)
SODIUM: 138 meq/L (ref 135–145)

## 2016-08-11 LAB — HEPATIC FUNCTION PANEL
ALT: 13 U/L (ref 0–35)
AST: 17 U/L (ref 0–37)
Albumin: 4.3 g/dL (ref 3.5–5.2)
Alkaline Phosphatase: 45 U/L (ref 39–117)
BILIRUBIN DIRECT: 0.1 mg/dL (ref 0.0–0.3)
BILIRUBIN TOTAL: 0.5 mg/dL (ref 0.2–1.2)
Total Protein: 7.1 g/dL (ref 6.0–8.3)

## 2016-08-11 LAB — LIPID PANEL
CHOLESTEROL: 204 mg/dL — AB (ref 0–200)
HDL: 72 mg/dL (ref >39.00)
LDL CALC: 114 mg/dL — AB (ref 0–99)
NONHDL: 131.72
Total CHOL/HDL Ratio: 3
Triglycerides: 90 mg/dL (ref 0.0–149.0)
VLDL: 18 mg/dL (ref 0.0–40.0)

## 2016-08-11 LAB — C-REACTIVE PROTEIN: CRP: 0.2 mg/dL — ABNORMAL LOW (ref 0.5–20.0)

## 2016-08-11 LAB — SEDIMENTATION RATE: SED RATE: 7 mm/h (ref 0–30)

## 2016-08-11 LAB — HEMOGLOBIN A1C: HEMOGLOBIN A1C: 6 % (ref 4.6–6.5)

## 2016-08-11 LAB — RHEUMATOID FACTOR

## 2016-08-12 ENCOUNTER — Encounter: Payer: Self-pay | Admitting: Internal Medicine

## 2016-08-12 LAB — ANTI-NUCLEAR AB-TITER (ANA TITER)

## 2016-08-12 LAB — ANA: Anti Nuclear Antibody(ANA): POSITIVE — AB

## 2016-08-15 ENCOUNTER — Other Ambulatory Visit: Payer: Self-pay | Admitting: Internal Medicine

## 2016-08-15 DIAGNOSIS — R768 Other specified abnormal immunological findings in serum: Secondary | ICD-10-CM

## 2016-08-15 DIAGNOSIS — M2550 Pain in unspecified joint: Secondary | ICD-10-CM

## 2016-09-03 ENCOUNTER — Ambulatory Visit (INDEPENDENT_AMBULATORY_CARE_PROVIDER_SITE_OTHER): Payer: Medicare Other | Admitting: Internal Medicine

## 2016-09-03 ENCOUNTER — Encounter: Payer: Self-pay | Admitting: Internal Medicine

## 2016-09-03 VITALS — BP 122/64 | HR 69 | Temp 98.5°F | Ht 60.0 in | Wt 145.2 lb

## 2016-09-03 DIAGNOSIS — E7800 Pure hypercholesterolemia, unspecified: Secondary | ICD-10-CM

## 2016-09-03 DIAGNOSIS — Z23 Encounter for immunization: Secondary | ICD-10-CM | POA: Diagnosis not present

## 2016-09-03 DIAGNOSIS — R739 Hyperglycemia, unspecified: Secondary | ICD-10-CM

## 2016-09-03 DIAGNOSIS — Z0000 Encounter for general adult medical examination without abnormal findings: Secondary | ICD-10-CM | POA: Diagnosis not present

## 2016-09-03 DIAGNOSIS — G4733 Obstructive sleep apnea (adult) (pediatric): Secondary | ICD-10-CM

## 2016-09-03 DIAGNOSIS — M545 Low back pain: Secondary | ICD-10-CM

## 2016-09-03 DIAGNOSIS — C50919 Malignant neoplasm of unspecified site of unspecified female breast: Secondary | ICD-10-CM

## 2016-09-03 DIAGNOSIS — K219 Gastro-esophageal reflux disease without esophagitis: Secondary | ICD-10-CM

## 2016-09-03 DIAGNOSIS — I10 Essential (primary) hypertension: Secondary | ICD-10-CM | POA: Diagnosis not present

## 2016-09-03 NOTE — Assessment & Plan Note (Signed)
Physical today 09/03/16.  Is s/p hysterectomy.  Mammogram 12/2015.  Followed by oncology.  Colonoscopy 2011.

## 2016-09-03 NOTE — Progress Notes (Signed)
Patient ID: Carla Ryan, female   DOB: [birth year], 70 y.o.   MRN: [id number]   Subjective:    Patient ID: Carla Ryan, female    DOB: 06-22-1946, 70 y.o.   MRN: [id number]  HPI  Patient here for her physical exam. Sees cardiology.  Just evaluated 08/30/16.  Had ETT sestamibi study 08/15/15 - EF 66% without evidence of ischemia.  Felt stable.  No changes made.  Followed by oncology for her breast cancer.  Received Zometa infusion 05/31/16.  Had mammogram 12/2015.  She reports she is doing relatively well.  No chest pain.  Breathing stable.  No abdominal pain or cramping. Bowels stable.     Past Medical History:  Diagnosis Date  . Abdominal hernia   . Anxiety    situational stress and anxiety  . Cancer ([place]) 2015   breast   . GERD (gastroesophageal reflux disease)   . Hiatal hernia   . Hypercholesterolemia   . Hypertension   . Osteoarthritis    scoliosis, degenerative disc dz, knee, carpal tunnel   Past Surgical History:  Procedure Laterality Date  . ABDOMINAL HYSTERECTOMY  07/2000  . APPENDECTOMY  2000  . BREAST BIOPSY Bilateral 2014  . BREAST SURGERY Right 2015   lumpectomy  . CARPAL TUNNEL RELEASE    . COLONOSCOPY  2011   [name], [name]: Normal exam  . HERNIA REPAIR  02/10/15  . Laparoscopic Repair of Lateral Abdominal Wall Hernia  02/10/15  . UPPER GI ENDOSCOPY  05/10/2014   [name], [name] Bile gastritis, medium hiatal hernia. Incomplete visualization of the cardia and fundus.   Family History  Problem Relation Age of Onset  . Diabetes Mother   . Multiple myeloma Mother   . Diabetes Brother   . Diabetes Sister     x2  . Hypertension Brother   . Multiple myeloma Father    Social History   Social History  . Marital status: Married    Spouse name: N/A  . Number of children: 2  . Years of education: N/A   Social History Main Topics  . Smoking status: Never Smoker  . Smokeless tobacco: Never Used  . Alcohol use 0.0 oz/week     Comment: wine  occasionally  . Drug use: No  . Sexual activity: Yes   Other Topics Concern  . None   Social History Narrative  . None    Outpatient Encounter Prescriptions as of 09/03/2016  Medication Sig  . anastrozole (ARIMIDEX) 1 MG tablet Take 1 mg by mouth daily.   Marland Kitchen aspirin 81 MG tablet Take 81 mg by mouth daily.  Marland Kitchen azelastine (ASTELIN) 0.1 % nasal spray Place 2 sprays into both nostrils 2 (two) times daily. Use in each nostril as directed  . Calcium Carbonate-Vitamin D (CALCIUM 600 + D PO) Take 600 mg by mouth 2 (two) times daily.  . cetirizine (ZYRTEC) 10 MG tablet Take 10 mg by mouth as needed for allergies.  . cholecalciferol (VITAMIN D) 1000 UNITS tablet Take 2,000 Units by mouth daily.   Marland Kitchen CINNAMON PO Take by mouth 2 (two) times daily.  . fexofenadine (ALLEGRA) 180 MG tablet Take 180 mg by mouth as needed.   . fluticasone (FLONASE) 50 MCG/ACT nasal spray Place 2 sprays into both nostrils daily.  Marland Kitchen gabapentin (NEURONTIN) 100 MG capsule Take 2 capsules (200 mg total) by mouth at bedtime.  . Misc Natural Products (TART CHERRY ADVANCED) CAPS Take 1 capsule by mouth 2 (two) times daily. Reported  on 03/04/2016  . Multiple Vitamin (MULTIVITAMIN) tablet Take 1 tablet by mouth daily.  . niacin (NIASPAN) 1000 MG CR tablet Take 1,000 mg by mouth 2 (two) times daily.   . RABEprazole (ACIPHEX) 20 MG tablet Take 20 mg by mouth 2 (two) times daily.  . rosuvastatin (CRESTOR) 5 MG tablet Take 1 tablet (5 mg total) by mouth daily.  . sucralfate (CARAFATE) 1 G tablet Take 1 g by mouth 2 (two) times daily.   Marland Kitchen triamterene-hydrochlorothiazide (DYAZIDE) 37.5-25 MG capsule Take 1 each (1 capsule total) by mouth daily.  . Zoledronic Acid (ZOMETA IV) Inject into the vein. Every 6 months   No facility-administered encounter medications on file as of 09/03/2016.     Review of Systems  Constitutional: Negative for appetite change and unexpected weight change.  HENT: Negative for congestion and sinus pressure.     Eyes: Negative for pain and visual disturbance.  Respiratory: Negative for cough, chest tightness and shortness of breath.   Cardiovascular: Negative for chest pain and palpitations.  Gastrointestinal: Negative for abdominal pain, diarrhea, nausea and vomiting.  Genitourinary: Negative for difficulty urinating and dysuria.  Musculoskeletal: Negative for back pain and joint swelling.  Skin: Negative for color change and rash.  Neurological: Negative for dizziness, light-headedness and headaches.  Hematological: Negative for adenopathy. Does not bruise/bleed easily.  Psychiatric/Behavioral: Negative for agitation and dysphoric mood.       Objective:    Physical Exam  Constitutional: She is oriented to person, place, and time. She appears well-developed and well-nourished. No distress.  HENT:  Nose: Nose normal.  Mouth/Throat: Oropharynx is clear and moist.  Eyes: Right eye exhibits no discharge. Left eye exhibits no discharge. No scleral icterus.  Neck: Neck supple. No thyromegaly present.  Cardiovascular: Normal rate and regular rhythm.   Pulmonary/Chest: Breath sounds normal. No accessory muscle usage. No tachypnea. No respiratory distress. She has no decreased breath sounds. She has no wheezes. She has no rhonchi. Right breast exhibits no inverted nipple, no mass, no nipple discharge and no tenderness (no axillary adenopathy). Left breast exhibits no inverted nipple, no mass, no nipple discharge and no tenderness (no axilarry adenopathy).  Abdominal: Soft. Bowel sounds are normal. There is no tenderness.  Musculoskeletal: She exhibits no edema or tenderness.  Lymphadenopathy:    She has no cervical adenopathy.  Neurological: She is alert and oriented to person, place, and time.  Skin: Skin is warm. No rash noted. No erythema.  Psychiatric: She has a normal mood and affect. Her behavior is normal.    BP 122/64   Pulse 69   Temp 98.5 F (36.9 C) (Oral)   Ht 5' (1.524 m)   Wt  145 lb 3.2 oz (65.9 kg)   SpO2 97%   BMI 28.36 kg/m  Wt Readings from Last 3 Encounters:  09/03/16 145 lb 3.2 oz (65.9 kg)  06/01/16 145 lb 2 oz (65.8 kg)  03/04/16 144 lb 5.6 oz (65.5 kg)     Lab Results  Component Value Date   WBC 5.8 03/04/2016   HGB 14.3 03/04/2016   HCT 42.0 03/04/2016   PLT 229.0 03/04/2016   GLUCOSE 101 (H) 08/11/2016   CHOL 204 (H) 08/11/2016   TRIG 90.0 08/11/2016   HDL 72.00 08/11/2016   LDLDIRECT 146.9 01/31/2014   LDLCALC 114 (H) 08/11/2016   ALT 13 08/11/2016   AST 17 08/11/2016   NA 138 08/11/2016   K 3.6 08/11/2016   CL 102 08/11/2016   CREATININE 0.87  08/11/2016   BUN 25 (H) 08/11/2016   CO2 29 08/11/2016   TSH 1.74 03/04/2016   HGBA1C 6.0 08/11/2016       Assessment & Plan:   Problem List Items Addressed This Visit    Back pain    Followed by Dr Dwaine Gale.       Breast cancer ([place])    Followed at [facility].  Seeing Dr Stasia Cavalier and Dr Harden Mo.  Mammogram 12/2015.        GERD (gastroesophageal reflux disease)    No nausea reported.  No acid reflux.  Follow.       Health care maintenance    Physical today 09/03/16.  Is s/p hysterectomy.  Mammogram 12/2015.  Followed by oncology.  Colonoscopy 2011.        Hypercholesterolemia    On crestor.  Low cholesterol diet and exercise.  Follow lipid panel and liver function tests.        Relevant Orders   Lipid panel   Hepatic function panel   Hyperglycemia    Low carb diet and exercise.  Follow met b and a1c.   Lab Results  Component Value Date   HGBA1C 6.0 08/11/2016        Relevant Orders   Hemoglobin A1c   Hypertension    Blood pressure under good control.  Continue same medication regimen.  Follow pressures.  Follow metabolic panel.        Relevant Orders   Basic metabolic panel   Obstructive sleep apnea    CPAP.        Other Visit Diagnoses    Routine general medical examination at a health care facility    -  Primary   Encounter for immunization       Relevant  Orders   Flu vaccine HIGH DOSE PF (Completed)       Einar Pheasant, MD

## 2016-09-03 NOTE — Progress Notes (Signed)
Pre visit review using our clinic review tool, if applicable. No additional management support is needed unless otherwise documented below in the visit note. 

## 2016-09-05 ENCOUNTER — Encounter: Payer: Self-pay | Admitting: Internal Medicine

## 2016-09-05 NOTE — Assessment & Plan Note (Signed)
On crestor.  Low cholesterol diet and exercise.  Follow lipid panel and liver function tests.   

## 2016-09-05 NOTE — Assessment & Plan Note (Signed)
No nausea reported.  No acid reflux.  Follow.

## 2016-09-05 NOTE — Assessment & Plan Note (Signed)
Followed at [facility].  Seeing Dr Stasia Cavalier and Dr Harden Mo.  Mammogram 12/2015.

## 2016-09-05 NOTE — Assessment & Plan Note (Signed)
Blood pressure under good control.  Continue same medication regimen.  Follow pressures.  Follow metabolic panel.   

## 2016-09-05 NOTE — Assessment & Plan Note (Signed)
Followed by Dr Dwaine Gale.

## 2016-09-05 NOTE — Assessment & Plan Note (Signed)
Low carb diet and exercise.  Follow met b and a1c.   Lab Results  Component Value Date   HGBA1C 6.0 08/11/2016

## 2016-09-05 NOTE — Assessment & Plan Note (Signed)
CPAP.  

## 2016-10-06 DIAGNOSIS — M654 Radial styloid tenosynovitis [de Quervain]: Secondary | ICD-10-CM | POA: Insufficient documentation

## 2016-10-06 DIAGNOSIS — R7689 Other specified abnormal immunological findings in serum: Secondary | ICD-10-CM | POA: Insufficient documentation

## 2016-10-06 DIAGNOSIS — R892 Abnormal level of other drugs, medicaments and biological substances in specimens from other organs, systems and tissues: Secondary | ICD-10-CM | POA: Insufficient documentation

## 2016-10-06 DIAGNOSIS — R768 Other specified abnormal immunological findings in serum: Secondary | ICD-10-CM | POA: Insufficient documentation

## 2016-10-07 ENCOUNTER — Encounter: Payer: Self-pay | Admitting: Internal Medicine

## 2016-11-08 ENCOUNTER — Ambulatory Visit: Payer: Medicare Other

## 2016-11-22 ENCOUNTER — Other Ambulatory Visit: Payer: Self-pay | Admitting: Internal Medicine

## 2016-12-07 ENCOUNTER — Other Ambulatory Visit: Payer: Self-pay | Admitting: Surgical

## 2016-12-07 MED ORDER — GABAPENTIN 100 MG PO CAPS: 200.0000 mg | ORAL_CAPSULE | Freq: Every day | ORAL | 1 refills | Status: DC

## 2017-01-03 ENCOUNTER — Other Ambulatory Visit: Payer: Self-pay | Admitting: Internal Medicine

## 2017-01-12 LAB — HM MAMMOGRAPHY

## 2017-03-01 ENCOUNTER — Other Ambulatory Visit: Payer: Medicare Other

## 2017-03-01 ENCOUNTER — Other Ambulatory Visit (INDEPENDENT_AMBULATORY_CARE_PROVIDER_SITE_OTHER): Payer: Medicare Other

## 2017-03-01 DIAGNOSIS — R739 Hyperglycemia, unspecified: Secondary | ICD-10-CM | POA: Diagnosis not present

## 2017-03-01 DIAGNOSIS — I10 Essential (primary) hypertension: Secondary | ICD-10-CM | POA: Diagnosis not present

## 2017-03-01 DIAGNOSIS — E7800 Pure hypercholesterolemia, unspecified: Secondary | ICD-10-CM | POA: Diagnosis not present

## 2017-03-01 LAB — BASIC METABOLIC PANEL
BUN: 22 mg/dL (ref 6–23)
CHLORIDE: 102 meq/L (ref 96–112)
CO2: 30 meq/L (ref 19–32)
CREATININE: 0.84 mg/dL (ref 0.40–1.20)
Calcium: 9.9 mg/dL (ref 8.4–10.5)
GFR: 70.62 mL/min (ref >60.00)
Glucose, Bld: 96 mg/dL (ref 70–99)
POTASSIUM: 4 meq/L (ref 3.5–5.1)
Sodium: 139 mEq/L (ref 135–145)

## 2017-03-01 LAB — LIPID PANEL
Cholesterol: 192 mg/dL (ref 0–200)
HDL: 67.8 mg/dL (ref >39.00)
LDL Cholesterol: 100 mg/dL — ABNORMAL HIGH (ref 0–99)
NonHDL: 123.92
TRIGLYCERIDES: 118 mg/dL (ref 0.0–149.0)
Total CHOL/HDL Ratio: 3
VLDL: 23.6 mg/dL (ref 0.0–40.0)

## 2017-03-01 LAB — HEPATIC FUNCTION PANEL
ALBUMIN: 4.2 g/dL (ref 3.5–5.2)
ALK PHOS: 38 U/L — AB (ref 39–117)
ALT: 14 U/L (ref 0–35)
AST: 16 U/L (ref 0–37)
Bilirubin, Direct: 0.1 mg/dL (ref 0.0–0.3)
TOTAL PROTEIN: 6.9 g/dL (ref 6.0–8.3)
Total Bilirubin: 0.5 mg/dL (ref 0.2–1.2)

## 2017-03-01 LAB — HEMOGLOBIN A1C: HEMOGLOBIN A1C: 6.2 % (ref 4.6–6.5)

## 2017-03-02 ENCOUNTER — Encounter: Payer: Self-pay | Admitting: Internal Medicine

## 2017-03-03 ENCOUNTER — Encounter: Payer: Self-pay | Admitting: Internal Medicine

## 2017-03-03 ENCOUNTER — Ambulatory Visit (INDEPENDENT_AMBULATORY_CARE_PROVIDER_SITE_OTHER): Payer: Medicare Other | Admitting: Internal Medicine

## 2017-03-03 DIAGNOSIS — E7800 Pure hypercholesterolemia, unspecified: Secondary | ICD-10-CM | POA: Diagnosis not present

## 2017-03-03 DIAGNOSIS — C50919 Malignant neoplasm of unspecified site of unspecified female breast: Secondary | ICD-10-CM | POA: Diagnosis not present

## 2017-03-03 DIAGNOSIS — R739 Hyperglycemia, unspecified: Secondary | ICD-10-CM | POA: Diagnosis not present

## 2017-03-03 DIAGNOSIS — G4733 Obstructive sleep apnea (adult) (pediatric): Secondary | ICD-10-CM | POA: Diagnosis not present

## 2017-03-03 DIAGNOSIS — K219 Gastro-esophageal reflux disease without esophagitis: Secondary | ICD-10-CM

## 2017-03-03 DIAGNOSIS — I10 Essential (primary) hypertension: Secondary | ICD-10-CM | POA: Diagnosis not present

## 2017-03-03 NOTE — Progress Notes (Signed)
Pre-visit discussion using our clinic review tool. No additional management support is needed unless otherwise documented below in the visit note.  

## 2017-03-03 NOTE — Progress Notes (Signed)
Patient ID: EVERLIE EBLE, female   DOB: [birth year], 71 y.o.   MRN: [id number]   Subjective:    Patient ID: [name], female    DOB: Jun 30, 1946, 71 y.o.   MRN: [id number]  HPI  Patient here for a scheduled follow up.  She feels she is doing well.  Joints are better.  Hands and knees doing better.  No chest pain.  Just saw Dr Saralyn Pilar 02/28/17.  Stable.  No changes made.  Breathing stable.  Some acid reflux a couple of weeks ago.  Increased carafate to tid.  Worked. Overall she feels is controlled.  Avoiding eating late.  No abdominal pain.  Bowels stable.  Saw oncology 12/2016.  Stable.  Due f/u in 05/2017.  Receiving zometa infusion.     Past Medical History:  Diagnosis Date  . Abdominal hernia   . Anxiety    situational stress and anxiety  . Cancer ([place]) 2015   breast   . GERD (gastroesophageal reflux disease)   . Hiatal hernia   . Hypercholesterolemia   . Hypertension   . Osteoarthritis    scoliosis, degenerative disc dz, knee, carpal tunnel   Past Surgical History:  Procedure Laterality Date  . ABDOMINAL HYSTERECTOMY  07/2000  . APPENDECTOMY  2000  . BREAST BIOPSY Bilateral 2014  . BREAST SURGERY Right 2015   lumpectomy  . CARPAL TUNNEL RELEASE    . COLONOSCOPY  2011   [name], [name]: Normal exam  . HERNIA REPAIR  02/10/15  . Laparoscopic Repair of Lateral Abdominal Wall Hernia  02/10/15  . UPPER GI ENDOSCOPY  05/10/2014   [name], [name] Bile gastritis, medium hiatal hernia. Incomplete visualization of the cardia and fundus.   Family History  Problem Relation Age of Onset  . Diabetes Mother   . Multiple myeloma Mother   . Diabetes Brother   . Diabetes Sister     x2  . Hypertension Brother   . Multiple myeloma Father    Social History   Social History  . Marital status: Married    Spouse name: N/A  . Number of children: 2  . Years of education: N/A   Social History Main Topics  . Smoking status: Never Smoker  . Smokeless tobacco: Never  Used  . Alcohol use 0.0 oz/week     Comment: wine occasionally  . Drug use: No  . Sexual activity: Yes   Other Topics Concern  . None   Social History Narrative  . None    Outpatient Encounter Prescriptions as of 03/03/2017  Medication Sig  . anastrozole (ARIMIDEX) 1 MG tablet TAKE 1 TABLET BY MOUTH ONCE DAILY  . aspirin 81 MG tablet Take 81 mg by mouth daily.  Marland Kitchen azelastine (ASTELIN) 0.1 % nasal spray Place 2 sprays into both nostrils 2 (two) times daily. Use in each nostril as directed  . Calcium Carbonate-Vitamin D (CALCIUM 600 + D PO) Take 600 mg by mouth 2 (two) times daily.  . cetirizine (ZYRTEC) 10 MG tablet Take 10 mg by mouth as needed for allergies.  . cholecalciferol (VITAMIN D) 1000 UNITS tablet Take 2,000 Units by mouth daily.   Marland Kitchen CINNAMON PO Take by mouth 2 (two) times daily.  . fexofenadine (ALLEGRA) 180 MG tablet Take 180 mg by mouth as needed.   . fluticasone (FLONASE) 50 MCG/ACT nasal spray Place 2 sprays into both nostrils daily.  Marland Kitchen gabapentin (NEURONTIN) 100 MG capsule Take 2 capsules (200 mg total) by mouth at bedtime.  Marland Kitchen  Misc Natural Products (TART CHERRY ADVANCED) CAPS Take 1 capsule by mouth 2 (two) times daily. Reported on 03/04/2016  . Multiple Vitamin (MULTIVITAMIN) tablet Take 1 tablet by mouth daily.  . niacin (NIASPAN) 1000 MG CR tablet Take 1,000 mg by mouth 2 (two) times daily.   . RABEprazole (ACIPHEX) 20 MG tablet Take 20 mg by mouth 2 (two) times daily.  . rosuvastatin (CRESTOR) 5 MG tablet TAKE 1 TABLET BY MOUTH  DAILY  . sucralfate (CARAFATE) 1 G tablet Take 1 g by mouth 2 (two) times daily.   Marland Kitchen triamterene-hydrochlorothiazide (DYAZIDE) 37.5-25 MG capsule Take 1 each (1 capsule total) by mouth daily.  . Zoledronic Acid (ZOMETA IV) Inject into the vein. Every 6 months  . [DISCONTINUED] triamterene-hydrochlorothiazide (JOINOMV-67) 37.5-25 MG tablet TAKE 1 TABLET BY MOUTH  DAILY (Patient not taking: Reported on 03/03/2017)   No facility-administered  encounter medications on file as of 03/03/2017.     Review of Systems  Constitutional: Negative for appetite change and unexpected weight change.  HENT: Negative for congestion and sinus pressure.   Respiratory: Negative for cough, chest tightness and shortness of breath.   Cardiovascular: Negative for chest pain, palpitations and leg swelling.  Gastrointestinal: Negative for abdominal pain, diarrhea, nausea and vomiting.  Genitourinary: Negative for difficulty urinating and dysuria.  Musculoskeletal: Negative for back pain.       Joints doing better.   Skin: Negative for color change and rash.  Neurological: Negative for dizziness, light-headedness and headaches.  Psychiatric/Behavioral: Negative for agitation and dysphoric mood.       Objective:    Physical Exam  Constitutional: She appears well-developed and well-nourished. No distress.  HENT:  Nose: Nose normal.  Mouth/Throat: Oropharynx is clear and moist.  Neck: Neck supple. No thyromegaly present.  Cardiovascular: Normal rate and regular rhythm.   Pulmonary/Chest: Breath sounds normal. No respiratory distress. She has no wheezes.  Abdominal: Soft. Bowel sounds are normal. There is no tenderness.  Musculoskeletal: She exhibits no edema or tenderness.  Lymphadenopathy:    She has no cervical adenopathy.  Skin: No rash noted. No erythema.  Psychiatric: She has a normal mood and affect. Her behavior is normal.    BP 120/62 (BP Location: Left Arm, Patient Position: Sitting, Cuff Size: Normal)   Pulse 74   Temp 98.6 F (37 C) (Oral)   Resp 16   Ht 5' (1.524 m)   Wt 147 lb (66.7 kg)   SpO2 98%   BMI 28.71 kg/m  Wt Readings from Last 3 Encounters:  03/03/17 147 lb (66.7 kg)  09/03/16 145 lb 3.2 oz (65.9 kg)  06/01/16 145 lb 2 oz (65.8 kg)     Lab Results  Component Value Date   WBC 5.8 03/04/2016   HGB 14.3 03/04/2016   HCT 42.0 03/04/2016   PLT 229.0 03/04/2016   GLUCOSE 96 03/01/2017   CHOL 192 03/01/2017     TRIG 118.0 03/01/2017   HDL 67.80 03/01/2017   LDLDIRECT 146.9 01/31/2014   LDLCALC 100 (H) 03/01/2017   ALT 14 03/01/2017   AST 16 03/01/2017   NA 139 03/01/2017   K 4.0 03/01/2017   CL 102 03/01/2017   CREATININE 0.84 03/01/2017   BUN 22 03/01/2017   CO2 30 03/01/2017   TSH 1.74 03/04/2016   HGBA1C 6.2 03/01/2017       Assessment & Plan:   Problem List Items Addressed This Visit    Breast cancer ([place])    Followed at [facility].  Seeing  Dr Stasia Cavalier and Dr Harden Mo.  Last evaluated 12/2016.  Due f/u in 05/2017.        Relevant Medications   anastrozole (ARIMIDEX) 1 MG tablet   GERD (gastroesophageal reflux disease)    Increased carafate recently.  Symptoms controlled.  Continue f/u with GI.  Notify if persistent issues or if return of symptoms.        Hypercholesterolemia    Low cholesterol diet and exercise.  On crestor.  Follow lipid panel and liver function tests.        Relevant Orders   Hepatic function panel   Lipid panel   Hyperglycemia    Low carb diet and exercise.  Follow met b and a1c.        Relevant Orders   Hemoglobin A1c   Hypertension    Blood pressure under good control.  Continue same medication regimen.  Follow pressures.  Follow metabolic panel.        Relevant Orders   TSH   CBC with Differential/Platelet   Basic metabolic panel   Obstructive sleep apnea    Continue CPAP.           Einar Pheasant, MD

## 2017-03-07 ENCOUNTER — Encounter: Payer: Self-pay | Admitting: Internal Medicine

## 2017-03-07 NOTE — Assessment & Plan Note (Signed)
Low cholesterol diet and exercise.  On crestor.  Follow lipid panel and liver function tests.  

## 2017-03-07 NOTE — Assessment & Plan Note (Signed)
Blood pressure under good control.  Continue same medication regimen.  Follow pressures.  Follow metabolic panel.   

## 2017-03-07 NOTE — Assessment & Plan Note (Signed)
Increased carafate recently.  Symptoms controlled.  Continue f/u with GI.  Notify if persistent issues or if return of symptoms.

## 2017-03-07 NOTE — Assessment & Plan Note (Signed)
Followed at [facility].  Seeing Dr Stasia Cavalier and Dr Harden Mo.  Last evaluated 12/2016.  Due f/u in 05/2017.

## 2017-03-07 NOTE — Assessment & Plan Note (Signed)
Low carb diet and exercise.  Follow met b and a1c.   

## 2017-03-07 NOTE — Assessment & Plan Note (Signed)
Continue CPAP.  

## 2017-03-09 ENCOUNTER — Encounter: Payer: Self-pay | Admitting: Internal Medicine

## 2017-03-10 ENCOUNTER — Other Ambulatory Visit: Payer: Self-pay

## 2017-03-10 MED ORDER — TRIAMTERENE-HCTZ 37.5-25 MG PO CAPS: 1.0000 | ORAL_CAPSULE | Freq: Every day | ORAL | 1 refills | Status: DC

## 2017-03-17 ENCOUNTER — Other Ambulatory Visit: Payer: Self-pay | Admitting: Internal Medicine

## 2017-05-02 ENCOUNTER — Ambulatory Visit (INDEPENDENT_AMBULATORY_CARE_PROVIDER_SITE_OTHER): Payer: Medicare Other

## 2017-05-02 VITALS — BP 124/62 | HR 72 | Temp 98.0°F | Resp 12 | Ht 59.5 in | Wt 148.1 lb

## 2017-05-02 DIAGNOSIS — Z0000 Encounter for general adult medical examination without abnormal findings: Secondary | ICD-10-CM | POA: Diagnosis not present

## 2017-05-02 NOTE — Patient Instructions (Addendum)
  Ms. Carla Ryan , Thank you for taking time to come for your Medicare Wellness Visit. I appreciate your ongoing commitment to your health goals. Please review the following plan we discussed and let me know if I can assist you in the future.   Follow up with Dr. Nicki Reaper as needed.    Bring a copy of your [place] and/or Living Will to be scanned into chart.  Have a great day!  These are the goals we discussed: Goals    . Increase physical activity          Start walking 3 times a week for 30 minutes.  Moderate pace.       This is a list of the screening recommended for you and due dates:  Health Maintenance  Topic Date Due  . Flu Shot  07/20/2017  . Mammogram  01/12/2019  . Colon Cancer Screening  10/23/2020  . Tetanus Vaccine  12/03/2025  . DEXA scan (bone density measurement)  Completed  .  Hepatitis C: One time screening is recommended by Center for Disease Control  (CDC) for  adults born from [birth year] through 1965.   Completed  . Pneumonia vaccines  Completed

## 2017-05-02 NOTE — Progress Notes (Signed)
Subjective:   Carla Ryan is a 71 y.o. female who presents for Medicare Annual (Subsequent) preventive examination.  Review of Systems:  No ROS.  Medicare Wellness Visit. Cardiac Risk Factors include: advanced age (>25mn, >>67women);hypertension     Objective:     Vitals: BP 124/62 (BP Location: Left Arm, Patient Position: Sitting, Cuff Size: Normal)   Pulse 72   Temp 98 F (36.7 C) (Oral)   Resp 12   Ht 4' 11.5" (1.511 m)   Wt 148 lb 1.9 oz (67.2 kg)   SpO2 98%   BMI 29.42 kg/m   Body mass index is 29.42 kg/m.   Tobacco History  Smoking Status  . Never Smoker  Smokeless Tobacco  . Never Used     Counseling given: Not Answered   Past Medical History:  Diagnosis Date  . Abdominal hernia   . Anxiety    situational stress and anxiety  . [place]   breast   . GERD (gastroesophageal reflux disease)   . Hiatal hernia   . Hypercholesterolemia   . Hypertension   . Osteoarthritis    scoliosis, degenerative disc dz, knee, carpal tunnel   Past Surgical History:  Procedure Laterality Date  . ABDOMINAL HYSTERECTOMY  07/2000  . APPENDECTOMY  2000  . BREAST BIOPSY Bilateral 2014  . BREAST SURGERY Right 2015   lumpectomy  . CARPAL TUNNEL RELEASE    . COLONOSCOPY  2011   MLoistine Simas [name]: Normal exam  . HERNIA REPAIR  02/10/15  . Laparoscopic Repair of Lateral Abdominal Wall Hernia  02/10/15  . UPPER GI ENDOSCOPY  05/10/2014   MLoistine Simas [name] Bile gastritis, medium hiatal hernia. Incomplete visualization of the cardia and fundus.   Family History  Problem Relation Age of Onset  . Diabetes Mother   . Multiple myeloma Mother   . Diabetes Brother   . Diabetes Sister        x2  . Hypertension Brother   . Multiple myeloma Father    History  Sexual Activity  . Sexual activity: Yes    Outpatient Encounter Prescriptions as of 05/02/2017  Medication Sig  . anastrozole (ARIMIDEX) 1 MG tablet TAKE 1 TABLET BY MOUTH ONCE DAILY  . aspirin  81 MG tablet Take 81 mg by mouth daily.  .Marland Kitchenazelastine (ASTELIN) 0.1 % nasal spray Place 2 sprays into both nostrils 2 (two) times daily. Use in each nostril as directed  . b complex vitamins tablet Take 1 tablet by mouth daily.  . Calcium Carbonate-Vitamin D (CALCIUM 600 + D PO) Take 600 mg by mouth 2 (two) times daily.  . cetirizine (ZYRTEC) 10 MG tablet Take 10 mg by mouth as needed for allergies.  . cholecalciferol (VITAMIN D) 1000 UNITS tablet Take 2,000 Units by mouth daily.   .Marland KitchenCINNAMON PO Take by mouth 2 (two) times daily.  . fexofenadine (ALLEGRA) 180 MG tablet Take 180 mg by mouth as needed.   . fluticasone (FLONASE) 50 MCG/ACT nasal spray Place 2 sprays into both nostrils daily.  .Marland Kitchengabapentin (NEURONTIN) 100 MG capsule TAKE 2 CAPSULES BY MOUTH AT BEDTIME  . Misc Natural Products (TART CHERRY ADVANCED) CAPS Take 1 capsule by mouth 2 (two) times daily. Reported on 03/04/2016  . Multiple Vitamin (MULTIVITAMIN) tablet Take 1 tablet by mouth daily.  . RABEprazole (ACIPHEX) 20 MG tablet Take 20 mg by mouth 2 (two) times daily.  . rosuvastatin (CRESTOR) 5 MG tablet TAKE 1 TABLET BY MOUTH  DAILY  .  sucralfate (CARAFATE) 1 G tablet Take 1 g by mouth 2 (two) times daily.   Marland Kitchen triamterene-hydrochlorothiazide (DYAZIDE) 37.5-25 MG capsule Take 1 each (1 capsule total) by mouth daily.  . Zoledronic Acid (ZOMETA IV) Inject into the vein. Every 6 months  . [DISCONTINUED] niacin (NIASPAN) 1000 MG CR tablet Take 1,000 mg by mouth 2 (two) times daily.    No facility-administered encounter medications on file as of 05/02/2017.     Activities of Daily Living In your present state of health, do you have any difficulty performing the following activities: 05/02/2017  Hearing? N  Vision? N  Difficulty concentrating or making decisions? N  Walking or climbing stairs? N  Dressing or bathing? N  Doing errands, shopping? N  Preparing Food and eating ? N  Using the Toilet? N  In the past six months, have  you accidently leaked urine? N  Do you have problems with loss of bowel control? N  Managing your Medications? N  Managing your Finances? N  Housekeeping or managing your Housekeeping? N  Some recent data might be hidden    Patient Care Team: Einar Pheasant, MD as PCP - General (Internal Medicine) Einar Pheasant, MD (Internal Medicine) Bary Castilla Forest Gleason, MD (General Surgery)    Assessment:    This is a routine wellness examination for Carla Ryan. The goal of the wellness visit is to assist the patient how to close the gaps in care and create a preventative care plan for the patient.   Taking calcium VIT D as appropriate/Osteoporosis risk reviewed.  Medications reviewed; taking without issues or barriers.  Safety issues reviewed; smoke detectors in the home. No firearms in the home.  Wears seatbelts when driving or riding with others. Patient does wear sunscreen or protective clothing when in direct sunlight. No violence in the home.  Depression- PHQ 2 &9 complete.  No signs/symptoms or verbal communication regarding little pleasure in doing things, feeling down, depressed or hopeless. No changes in sleeping, energy, eating, concentrating.  No thoughts of self harm or harm towards others.  Time spent on this topic is 9 minutes.   Patient is alert, normal appearance, oriented to person/place/and time. Correctly identified the president of the [place], recall of 3/3 words, and performing simple calculations.  Patient displays appropriate judgement and can read correct time from watch face.  No new identified risk were noted.  No failures at ADL's or IADL's.   BMI- discussed the importance of a healthy diet, water intake and exercise. Educational material provided.   24 hour diet recall: Breakfast: [place], egg  Lunch: Grilled chicken salad Dinner: Chicken salad sandwich Snack: 1/2 apple fritter Daily fluid intake: 2 cups of caffeine, 4-6 cups of water  HTN- followed by  PCP.  Dental- every six months.  Eye- Visual acuity not assessed per patient preference since they have regular follow up with the ophthalmologist.  Wears corrective lenses.  Sleep patterns- Sleeps 7 hours at night.  Wakes feeling rested. CPAP in use.    Health maintenance gaps- closed.  Patient Concerns: None at this time. Follow up with PCP as needed.  Exercise Activities and Dietary recommendations Current Exercise Habits: The patient does not participate in regular exercise at present  Goals    . Increase physical activity          Start walking 3 times a week for 30 minutes.  Moderate pace.      Fall Risk Fall Risk  05/02/2017 09/03/2016 11/07/2015 10/08/2014 05/31/2013  Falls in  the past year? No No No No No   Depression Screen PHQ 2/9 Scores 05/02/2017 09/03/2016 11/07/2015 10/08/2014  PHQ - 2 Score 0 0 0 0  PHQ- 9 Score 0 - - -     Cognitive Function MMSE - Mini Mental State Exam 05/02/2017 11/07/2015  Orientation to time 5 5  Orientation to Place 5 5  Registration 3 3  Attention/ Calculation 5 5  Recall 3 3  Language- name 2 objects 2 2  Language- repeat 1 1  Language- follow 3 step command 3 3  Language- read & follow direction 1 1  Write a sentence 1 1  Copy design 1 1  Total score 30 30        Immunization History  Administered Date(s) Administered  . Influenza Split 10/25/2012  . Influenza, High Dose Seasonal PF 11/07/2015, 09/03/2016  . Influenza,inj,Quad PF,36+ Mos 09/28/2013, 10/08/2014  . Pneumococcal Conjugate-13 11/27/2013  . Pneumococcal Polysaccharide-23 11/05/2015  . Td 12/04/2015  . Tdap 12/20/2004  . Zoster 12/20/2008   Screening Tests Health Maintenance  Topic Date Due  . INFLUENZA VACCINE  07/20/2017  . MAMMOGRAM  01/12/2019  . COLONOSCOPY  10/23/2020  . TETANUS/TDAP  12/03/2025  . DEXA SCAN  Completed  . Hepatitis C Screening  Completed  . PNA vac Low Risk Adult  Completed      Plan:    End of life planning; Advance  aging; Advanced directives discussed. Copy of current HCPOA/Living Will requested.    I have personally reviewed and noted the following in the patient's chart:   . Medical and social history . Use of alcohol, tobacco or illicit drugs  . Current medications and supplements . Functional ability and status . Nutritional status . Physical activity . Advanced directives . List of other physicians . Hospitalizations, surgeries, and ER visits in previous 12 months . Vitals . Screenings to include cognitive, depression, and falls . Referrals and appointments  In addition, I have reviewed and discussed with patient certain preventive protocols, quality metrics, and best practice recommendations. A written personalized care plan for preventive services as well as general preventive health recommendations were provided to patient.     Varney Biles, LPN  0/51/1021   Reviewed above information.  Agree with plan.  Dr Nicki Reaper

## 2017-05-31 DIAGNOSIS — Z5181 Encounter for therapeutic drug level monitoring: Secondary | ICD-10-CM | POA: Insufficient documentation

## 2017-08-02 ENCOUNTER — Other Ambulatory Visit: Payer: Self-pay | Admitting: Internal Medicine

## 2017-08-08 ENCOUNTER — Other Ambulatory Visit: Payer: Self-pay | Admitting: Internal Medicine

## 2017-08-19 ENCOUNTER — Other Ambulatory Visit: Payer: Self-pay | Admitting: Internal Medicine

## 2017-09-01 ENCOUNTER — Other Ambulatory Visit: Payer: Medicare Other

## 2017-09-05 ENCOUNTER — Encounter: Payer: Medicare Other | Admitting: Internal Medicine

## 2017-10-05 ENCOUNTER — Ambulatory Visit (INDEPENDENT_AMBULATORY_CARE_PROVIDER_SITE_OTHER): Payer: Medicare Other | Admitting: *Deleted

## 2017-10-05 ENCOUNTER — Other Ambulatory Visit (INDEPENDENT_AMBULATORY_CARE_PROVIDER_SITE_OTHER): Payer: Medicare Other

## 2017-10-05 DIAGNOSIS — Z23 Encounter for immunization: Secondary | ICD-10-CM

## 2017-10-05 DIAGNOSIS — I10 Essential (primary) hypertension: Secondary | ICD-10-CM | POA: Diagnosis not present

## 2017-10-05 DIAGNOSIS — E7800 Pure hypercholesterolemia, unspecified: Secondary | ICD-10-CM

## 2017-10-05 DIAGNOSIS — R739 Hyperglycemia, unspecified: Secondary | ICD-10-CM

## 2017-10-05 LAB — CBC WITH DIFFERENTIAL/PLATELET
BASOS ABS: 0 10*3/uL (ref 0.0–0.1)
BASOS PCT: 0.8 % (ref 0.0–3.0)
EOS ABS: 0.2 10*3/uL (ref 0.0–0.7)
EOS PCT: 4.7 % (ref 0.0–5.0)
HCT: 41.3 % (ref 36.0–46.0)
Hemoglobin: 13.9 g/dL (ref 12.0–15.0)
LYMPHS PCT: 25.1 % (ref 12.0–46.0)
Lymphs Abs: 1.2 10*3/uL (ref 0.7–4.0)
MCHC: 33.6 g/dL (ref 30.0–36.0)
MCV: 93.2 fl (ref 78.0–100.0)
MONOS PCT: 8.7 % (ref 3.0–12.0)
Monocytes Absolute: 0.4 10*3/uL (ref 0.1–1.0)
NEUTROS ABS: 2.9 10*3/uL (ref 1.4–7.7)
Neutrophils Relative %: 60.7 % (ref 43.0–77.0)
PLATELETS: 226 10*3/uL (ref 150.0–400.0)
RBC: 4.43 Mil/uL (ref 3.87–5.11)
RDW: 15 % (ref 11.5–15.5)
WBC: 4.8 10*3/uL (ref 4.0–10.5)

## 2017-10-05 LAB — BASIC METABOLIC PANEL
BUN: 20 mg/dL (ref 6–23)
CHLORIDE: 103 meq/L (ref 96–112)
CO2: 29 mEq/L (ref 19–32)
Calcium: 9.8 mg/dL (ref 8.4–10.5)
Creatinine, Ser: 0.86 mg/dL (ref 0.40–1.20)
GFR: 68.62 mL/min (ref >60.00)
Glucose, Bld: 92 mg/dL (ref 70–99)
Potassium: 3.8 mEq/L (ref 3.5–5.1)
SODIUM: 139 meq/L (ref 135–145)

## 2017-10-05 LAB — LIPID PANEL
CHOL/HDL RATIO: 3
Cholesterol: 177 mg/dL (ref 0–200)
HDL: 65 mg/dL (ref >39.00)
LDL Cholesterol: 94 mg/dL (ref 0–99)
NonHDL: 112.14
TRIGLYCERIDES: 90 mg/dL (ref 0.0–149.0)
VLDL: 18 mg/dL (ref 0.0–40.0)

## 2017-10-05 LAB — HEPATIC FUNCTION PANEL
ALT: 18 U/L (ref 0–35)
AST: 20 U/L (ref 0–37)
Albumin: 4 g/dL (ref 3.5–5.2)
Alkaline Phosphatase: 42 U/L (ref 39–117)
BILIRUBIN DIRECT: 0.1 mg/dL (ref 0.0–0.3)
Total Bilirubin: 0.5 mg/dL (ref 0.2–1.2)
Total Protein: 6.7 g/dL (ref 6.0–8.3)

## 2017-10-05 LAB — TSH: TSH: 2.01 u[IU]/mL (ref 0.35–4.50)

## 2017-10-05 LAB — HEMOGLOBIN A1C: Hgb A1c MFr Bld: 6 % (ref 4.6–6.5)

## 2017-10-06 ENCOUNTER — Encounter: Payer: Self-pay | Admitting: Internal Medicine

## 2017-10-10 ENCOUNTER — Other Ambulatory Visit: Payer: Medicare Other

## 2017-10-14 ENCOUNTER — Ambulatory Visit (INDEPENDENT_AMBULATORY_CARE_PROVIDER_SITE_OTHER): Payer: Medicare Other | Admitting: Internal Medicine

## 2017-10-14 ENCOUNTER — Encounter: Payer: Self-pay | Admitting: Internal Medicine

## 2017-10-14 VITALS — BP 140/80 | HR 77 | Temp 98.6°F | Resp 14 | Ht 60.0 in | Wt 147.0 lb

## 2017-10-14 DIAGNOSIS — G4733 Obstructive sleep apnea (adult) (pediatric): Secondary | ICD-10-CM

## 2017-10-14 DIAGNOSIS — Z0000 Encounter for general adult medical examination without abnormal findings: Secondary | ICD-10-CM

## 2017-10-14 DIAGNOSIS — R739 Hyperglycemia, unspecified: Secondary | ICD-10-CM

## 2017-10-14 DIAGNOSIS — M545 Low back pain: Secondary | ICD-10-CM

## 2017-10-14 DIAGNOSIS — I10 Essential (primary) hypertension: Secondary | ICD-10-CM | POA: Diagnosis not present

## 2017-10-14 DIAGNOSIS — E7800 Pure hypercholesterolemia, unspecified: Secondary | ICD-10-CM

## 2017-10-14 DIAGNOSIS — K219 Gastro-esophageal reflux disease without esophagitis: Secondary | ICD-10-CM

## 2017-10-14 DIAGNOSIS — C50919 Malignant neoplasm of unspecified site of unspecified female breast: Secondary | ICD-10-CM | POA: Diagnosis not present

## 2017-10-14 MED ORDER — RABEPRAZOLE SODIUM 20 MG PO TBEC: 20.0000 mg | DELAYED_RELEASE_TABLET | Freq: Two times a day (BID) | ORAL | 1 refills | Status: AC

## 2017-10-14 MED ORDER — METHOCARBAMOL 500 MG PO TABS
500.0000 mg | ORAL_TABLET | Freq: Every evening | ORAL | 0 refills | Status: DC | PRN
Start: 2017-10-14 — End: 2019-11-01

## 2017-10-14 MED ORDER — SUCRALFATE 1 G PO TABS: 1.0000 g | ORAL_TABLET | Freq: Two times a day (BID) | ORAL | 1 refills | Status: AC

## 2017-10-14 MED ORDER — TRIAMTERENE-HCTZ 37.5-25 MG PO CAPS: 1.0000 | ORAL_CAPSULE | Freq: Every day | ORAL | 1 refills | Status: DC

## 2017-10-14 NOTE — Progress Notes (Signed)
Patient ID: Carla Ryan, female   DOB: [birth year], 71 y.o.   MRN: [id number]   Subjective:    Patient ID: Carla Ryan, female    DOB: [birth year], 71 y.o.   MRN: [id number]  HPI  Patient here for her physical exam.  She reports she is doing relatively well.  Saw cardiology.  Stress test normal.  Sees GI.  On carafate and aciphex.  Unable to decrease the dose.  Symptoms controlled on current regimen.  cologuard 2016.  Negative.  Per GI due f/u 2021.  Will need to confirm date with them.  No chest pain.  No sob.  On arimedex.  Completed zometa - last 05/2017.  Recent bone density stable.  Back flares at times.  Request to have robaxin to take prn.  Tolerates.  Was moving furniture and recently had flare with her back.  No pain radiating down her legs.  No numbness or tingling.     Past Medical History:  Diagnosis Date  . Abdominal hernia   . Anxiety    situational stress and anxiety  . Cancer ([place]) 2015   breast   . GERD (gastroesophageal reflux disease)   . Hiatal hernia   . Hypercholesterolemia   . Hypertension   . Osteoarthritis    scoliosis, degenerative disc dz, knee, carpal tunnel   Past Surgical History:  Procedure Laterality Date  . ABDOMINAL HYSTERECTOMY  07/2000  . APPENDECTOMY  2000  . BREAST BIOPSY Bilateral 2014  . BREAST SURGERY Right 2015   lumpectomy  . CARPAL TUNNEL RELEASE    . COLONOSCOPY  2011   [name], [name]: Normal exam  . HERNIA REPAIR  02/10/15  . Laparoscopic Repair of Lateral Abdominal Wall Hernia  02/10/15  . UPPER GI ENDOSCOPY  05/10/2014   [name], [name] Bile gastritis, medium hiatal hernia. Incomplete visualization of the cardia and fundus.   Family History  Problem Relation Age of Onset  . Diabetes Mother   . Multiple myeloma Mother   . Diabetes Brother   . Diabetes Sister        x2  . Hypertension Brother   . Multiple myeloma Father    Social History   Social History  . Marital status: Married    Spouse name: N/A  .  Number of children: 2  . Years of education: N/A   Social History Main Topics  . Smoking status: Never Smoker  . Smokeless tobacco: Never Used  . Alcohol use 0.0 oz/week     Comment: wine occasionally  . Drug use: No  . Sexual activity: Yes   Other Topics Concern  . None   Social History Narrative  . None    Outpatient Encounter Prescriptions as of 10/14/2017  Medication Sig  . anastrozole (ARIMIDEX) 1 MG tablet TAKE 1 TABLET BY MOUTH ONCE DAILY  . aspirin 81 MG tablet Take 81 mg by mouth daily.  Marland Kitchen azelastine (ASTELIN) 0.1 % nasal spray USE 2 SPRAYS IN EACH  NOSTRIL TWO TIMES DAILY AS  DIRECTED  . b complex vitamins tablet Take 1 tablet by mouth daily.  . Calcium Carbonate-Vitamin D (CALCIUM 600 + D PO) Take 600 mg by mouth 2 (two) times daily.  . cetirizine (ZYRTEC) 10 MG tablet Take 10 mg by mouth as needed for allergies.  . cholecalciferol (VITAMIN D) 1000 UNITS tablet Take 2,000 Units by mouth daily.   Marland Kitchen CINNAMON PO Take by mouth 2 (two) times daily.  . fexofenadine (ALLEGRA) 180 MG tablet  Take 180 mg by mouth as needed.   . fluticasone (FLONASE) 50 MCG/ACT nasal spray USE 2 SPRAYS IN EACH  NOSTRIL DAILY  . gabapentin (NEURONTIN) 100 MG capsule TAKE 2 CAPSULES BY MOUTH AT BEDTIME  . Misc Natural Products (TART CHERRY ADVANCED) CAPS Take 1 capsule by mouth 2 (two) times daily. Reported on 03/04/2016  . Multiple Vitamin (MULTIVITAMIN) tablet Take 1 tablet by mouth daily.  . RABEprazole (ACIPHEX) 20 MG tablet Take 1 tablet (20 mg total) by mouth 2 (two) times daily.  . rosuvastatin (CRESTOR) 5 MG tablet TAKE 1 TABLET BY MOUTH  DAILY  . sucralfate (CARAFATE) 1 g tablet Take 1 tablet (1 g total) by mouth 2 (two) times daily.  Marland Kitchen triamterene-hydrochlorothiazide (DYAZIDE) 37.5-25 MG capsule Take 1 each (1 capsule total) by mouth daily.  . Zoledronic Acid (ZOMETA IV) Inject into the vein. Every 6 months  . [DISCONTINUED] RABEprazole (ACIPHEX) 20 MG tablet Take 20 mg by mouth 2 (two)  times daily.  . [DISCONTINUED] sucralfate (CARAFATE) 1 G tablet Take 1 g by mouth 2 (two) times daily.   . [DISCONTINUED] triamterene-hydrochlorothiazide (DYAZIDE) 37.5-25 MG capsule Take 1 each (1 capsule total) by mouth daily.  . methocarbamol (ROBAXIN) 500 MG tablet Take 1 tablet (500 mg total) by mouth at bedtime as needed for muscle spasms.   No facility-administered encounter medications on file as of 10/14/2017.     Review of Systems  Constitutional: Negative for appetite change and unexpected weight change.  HENT: Negative for congestion and sinus pressure.   Eyes: Negative for pain and visual disturbance.  Respiratory: Negative for cough, chest tightness and shortness of breath.   Cardiovascular: Negative for chest pain, palpitations and leg swelling.  Gastrointestinal: Negative for abdominal pain, diarrhea, nausea and vomiting.  Genitourinary: Negative for difficulty urinating and dysuria.  Musculoskeletal: Positive for back pain. Negative for joint swelling.  Skin: Negative for color change and rash.  Neurological: Negative for dizziness, light-headedness and headaches.  Hematological: Negative for adenopathy. Does not bruise/bleed easily.  Psychiatric/Behavioral: Negative for agitation and dysphoric mood.       Objective:     Blood pressure rechecked by me:  140/72  Physical Exam  Constitutional: She is oriented to person, place, and time. She appears well-developed and well-nourished. No distress.  HENT:  Nose: Nose normal.  Mouth/Throat: Oropharynx is clear and moist.  Eyes: Right eye exhibits no discharge. Left eye exhibits no discharge. No scleral icterus.  Neck: Neck supple. No thyromegaly present.  Cardiovascular: Normal rate and regular rhythm.   Pulmonary/Chest: Breath sounds normal. No accessory muscle usage. No tachypnea. No respiratory distress. She has no decreased breath sounds. She has no wheezes. She has no rhonchi. Right breast exhibits no inverted  nipple, no mass, no nipple discharge and no tenderness (no axillary adenopathy). Left breast exhibits no inverted nipple, no mass, no nipple discharge and no tenderness (no axilarry adenopathy).  Abdominal: Soft. Bowel sounds are normal. There is no tenderness.  Musculoskeletal: She exhibits no edema or tenderness.  Lymphadenopathy:    She has no cervical adenopathy.  Neurological: She is alert and oriented to person, place, and time.  Skin: Skin is warm. No rash noted. No erythema.  Psychiatric: She has a normal mood and affect. Her behavior is normal.    BP 140/80 (BP Location: Left Arm, Patient Position: Sitting, Cuff Size: Normal)   Pulse 77   Temp 98.6 F (37 C) (Oral)   Resp 14   Ht 5' (1.524 m)  Wt 147 lb (66.7 kg)   SpO2 98%   BMI 28.71 kg/m  Wt Readings from Last 3 Encounters:  10/14/17 147 lb (66.7 kg)  05/02/17 148 lb 1.9 oz (67.2 kg)  03/03/17 147 lb (66.7 kg)     Lab Results  Component Value Date   WBC 4.8 10/05/2017   HGB 13.9 10/05/2017   HCT 41.3 10/05/2017   PLT 226.0 10/05/2017   GLUCOSE 92 10/05/2017   CHOL 177 10/05/2017   TRIG 90.0 10/05/2017   HDL 65.00 10/05/2017   LDLDIRECT 146.9 01/31/2014   LDLCALC 94 10/05/2017   ALT 18 10/05/2017   AST 20 10/05/2017   NA 139 10/05/2017   K 3.8 10/05/2017   CL 103 10/05/2017   CREATININE 0.86 10/05/2017   BUN 20 10/05/2017   CO2 29 10/05/2017   TSH 2.01 10/05/2017   HGBA1C 6.0 10/05/2017       Assessment & Plan:   Problem List Items Addressed This Visit    Back pain    Has seen Dr Dwaine Gale.  Flares intermittently.  Recently moving furniture.  Requested rx for robaxin to have if needed.  Follow   No radicular symptoms.        Relevant Medications   methocarbamol (ROBAXIN) 500 MG tablet   Breast cancer ([place])    Followed at [facility].  Seeing Dr Adela Ports and Dr Harden Mo.  On arimidex.        GERD (gastroesophageal reflux disease)    Symptoms controlled on current dose of aciphex and carafate.   Tried to decrease the dose.  Unable.  Followed by GI.  Stable on current regimen.        Relevant Medications   RABEprazole (ACIPHEX) 20 MG tablet   sucralfate (CARAFATE) 1 g tablet   Health care maintenance    Physical today 10/14/17.  S/p hysterectomy.  Mammogram 01/12/17 - Biradas I. cologuard 2016.        Hypercholesterolemia    On crestor.  Low cholesterol diet and exercise.  Follow lipid panel and liver function tests.        Relevant Medications   triamterene-hydrochlorothiazide (DYAZIDE) 37.5-25 MG capsule   Other Relevant Orders   Hepatic function panel   Lipid panel   Hyperglycemia    Low carb diet and exercise.  Follow met b and a1c.        Relevant Orders   Hemoglobin A1c   Hypertension    Blood pressure as outlined.  Continue same medication regimen.  Follow pressures.  Follow metabolic panel.        Relevant Medications   triamterene-hydrochlorothiazide (DYAZIDE) 37.5-25 MG capsule   Other Relevant Orders   Basic metabolic panel   Obstructive sleep apnea    Continue cpap.         Other Visit Diagnoses    Routine general medical examination at a health care facility    -  Primary       Einar Pheasant, MD

## 2017-10-14 NOTE — Assessment & Plan Note (Addendum)
Physical today 10/14/17.  S/p hysterectomy.  Mammogram 01/12/17 - Biradas I. cologuard 2016.

## 2017-10-16 ENCOUNTER — Encounter: Payer: Self-pay | Admitting: Internal Medicine

## 2017-10-16 NOTE — Assessment & Plan Note (Signed)
Continue cpap.  

## 2017-10-16 NOTE — Assessment & Plan Note (Signed)
Low carb diet and exercise.  Follow met b and a1c.   

## 2017-10-16 NOTE — Assessment & Plan Note (Signed)
Followed at [facility].  Seeing Dr Adela Ports and Dr Harden Mo.  On arimidex.

## 2017-10-16 NOTE — Assessment & Plan Note (Signed)
On crestor.  Low cholesterol diet and exercise.  Follow lipid panel and liver function tests.   

## 2017-10-16 NOTE — Assessment & Plan Note (Signed)
Symptoms controlled on current dose of aciphex and carafate.  Tried to decrease the dose.  Unable.  Followed by GI.  Stable on current regimen.

## 2017-10-16 NOTE — Assessment & Plan Note (Signed)
Blood pressure as outlined.  Continue same medication regimen.  Follow pressures.  Follow metabolic panel.  

## 2017-10-16 NOTE — Assessment & Plan Note (Signed)
Has seen Dr Dwaine Gale.  Flares intermittently.  Recently moving furniture.  Requested rx for robaxin to have if needed.  Follow   No radicular symptoms.

## 2017-12-01 ENCOUNTER — Other Ambulatory Visit: Payer: Self-pay | Admitting: Internal Medicine

## 2018-01-13 ENCOUNTER — Other Ambulatory Visit: Payer: Self-pay | Admitting: Internal Medicine

## 2018-02-21 ENCOUNTER — Other Ambulatory Visit (INDEPENDENT_AMBULATORY_CARE_PROVIDER_SITE_OTHER): Payer: Medicare Other

## 2018-02-21 ENCOUNTER — Encounter: Payer: Self-pay | Admitting: Internal Medicine

## 2018-02-21 DIAGNOSIS — E7800 Pure hypercholesterolemia, unspecified: Secondary | ICD-10-CM

## 2018-02-21 DIAGNOSIS — R739 Hyperglycemia, unspecified: Secondary | ICD-10-CM | POA: Diagnosis not present

## 2018-02-21 DIAGNOSIS — I10 Essential (primary) hypertension: Secondary | ICD-10-CM

## 2018-02-21 LAB — HEMOGLOBIN A1C: Hgb A1c MFr Bld: 6.1 % (ref 4.6–6.5)

## 2018-02-21 LAB — HEPATIC FUNCTION PANEL
ALBUMIN: 4.1 g/dL (ref 3.5–5.2)
ALK PHOS: 47 U/L (ref 39–117)
ALT: 14 U/L (ref 0–35)
AST: 15 U/L (ref 0–37)
Bilirubin, Direct: 0.1 mg/dL (ref 0.0–0.3)
TOTAL PROTEIN: 6.9 g/dL (ref 6.0–8.3)
Total Bilirubin: 0.5 mg/dL (ref 0.2–1.2)

## 2018-02-21 LAB — BASIC METABOLIC PANEL
BUN: 19 mg/dL (ref 6–23)
CALCIUM: 9.9 mg/dL (ref 8.4–10.5)
CO2: 31 meq/L (ref 19–32)
Chloride: 102 mEq/L (ref 96–112)
Creatinine, Ser: 0.93 mg/dL (ref 0.40–1.20)
GFR: 62.63 mL/min (ref >60.00)
Glucose, Bld: 89 mg/dL (ref 70–99)
POTASSIUM: 3.7 meq/L (ref 3.5–5.1)
SODIUM: 139 meq/L (ref 135–145)

## 2018-02-21 LAB — LIPID PANEL
Cholesterol: 177 mg/dL (ref 0–200)
HDL: 66.5 mg/dL (ref >39.00)
LDL Cholesterol: 89 mg/dL (ref 0–99)
NonHDL: 110.02
Total CHOL/HDL Ratio: 3
Triglycerides: 105 mg/dL (ref 0.0–149.0)
VLDL: 21 mg/dL (ref 0.0–40.0)

## 2018-02-23 ENCOUNTER — Ambulatory Visit: Payer: Medicare Other | Admitting: Internal Medicine

## 2018-02-23 ENCOUNTER — Encounter: Payer: Self-pay | Admitting: Internal Medicine

## 2018-02-23 VITALS — BP 122/60 | HR 66 | Temp 98.1°F | Resp 16 | Wt 145.8 lb

## 2018-02-23 DIAGNOSIS — E7800 Pure hypercholesterolemia, unspecified: Secondary | ICD-10-CM

## 2018-02-23 DIAGNOSIS — L989 Disorder of the skin and subcutaneous tissue, unspecified: Secondary | ICD-10-CM

## 2018-02-23 DIAGNOSIS — K219 Gastro-esophageal reflux disease without esophagitis: Secondary | ICD-10-CM

## 2018-02-23 DIAGNOSIS — J309 Allergic rhinitis, unspecified: Secondary | ICD-10-CM

## 2018-02-23 DIAGNOSIS — G4733 Obstructive sleep apnea (adult) (pediatric): Secondary | ICD-10-CM | POA: Diagnosis not present

## 2018-02-23 DIAGNOSIS — C50919 Malignant neoplasm of unspecified site of unspecified female breast: Secondary | ICD-10-CM

## 2018-02-23 DIAGNOSIS — I10 Essential (primary) hypertension: Secondary | ICD-10-CM

## 2018-02-23 DIAGNOSIS — R739 Hyperglycemia, unspecified: Secondary | ICD-10-CM | POA: Diagnosis not present

## 2018-02-23 MED ORDER — TRIAMTERENE-HCTZ 37.5-25 MG PO CAPS: 1.0000 | ORAL_CAPSULE | Freq: Every day | ORAL | 1 refills | Status: DC

## 2018-02-23 MED ORDER — LEVOCETIRIZINE DIHYDROCHLORIDE 5 MG PO TABS: 5.0000 mg | ORAL_TABLET | Freq: Every evening | ORAL | 0 refills | Status: DC

## 2018-02-23 NOTE — Progress Notes (Signed)
Patient ID: Carla Ryan, female   DOB: May 14, 1946, 72 y.o.   MRN: [id number]   Subjective:    Patient ID: Carla Ryan, female    DOB: 1946/08/01, 72 y.o.   MRN: [id number]  HPI  Patient here for a scheduled follow up.  Has a history of breast cancer.  Sees oncology.  Mammogram 01/18/18 - birads II.  Also sees GI for her acid reflux.  Controlled on carafate and aciphex.  Last evaluated 06/2017.  Stable.  Recommended f/u in one year.  Due colonoscopy 2021.  Back is better.  If she sleeps on her back, this helps.  Still some allergy issues.  Feels her claritin not helping like it used to.  Discussed other treatment options.  Using flonase.  She is planning to get skin check f/u with Dr Phillip Heal.  Desires referral.  Has lesion on lower leg she would like checked.   No chest pain.  No sob.  No acid reflux.  No abdominal pain.  Bowels moving.     Past Medical History:  Diagnosis Date  . Abdominal hernia   . Anxiety    situational stress and anxiety  . Cancer ([place]) 2015   breast   . GERD (gastroesophageal reflux disease)   . Hiatal hernia   . Hypercholesterolemia   . Hypertension   . Osteoarthritis    scoliosis, degenerative disc dz, knee, carpal tunnel   Past Surgical History:  Procedure Laterality Date  . ABDOMINAL HYSTERECTOMY  07/2000  . APPENDECTOMY  2000  . BREAST BIOPSY Bilateral 2014  . BREAST SURGERY Right 2015   lumpectomy  . CARPAL TUNNEL RELEASE    . COLONOSCOPY  2011   [name], [name]: Normal exam  . HERNIA REPAIR  02/10/15  . Laparoscopic Repair of Lateral Abdominal Wall Hernia  02/10/15  . UPPER GI ENDOSCOPY  05/10/2014   [name], [name] Bile gastritis, medium hiatal hernia. Incomplete visualization of the cardia and fundus.   Family History  Problem Relation Age of Onset  . Diabetes Mother   . Multiple myeloma Mother   . Diabetes Brother   . Diabetes Sister        x2  . Hypertension Brother   . Multiple myeloma Father    Social History    Socioeconomic History  . Marital status: Married    Spouse name: None  . Number of children: 2  . Years of education: None  . Highest education level: None  Social Needs  . Financial resource strain: None  . Food insecurity - worry: None  . Food insecurity - inability: None  . Transportation needs - medical: None  . Transportation needs - non-medical: None  Occupational History  . None  Tobacco Use  . Smoking status: Never Smoker  . Smokeless tobacco: Never Used  Substance and Sexual Activity  . Alcohol use: Yes    Alcohol/week: 0.0 oz    Comment: wine occasionally  . Drug use: No  . Sexual activity: Yes  Other Topics Concern  . None  Social History Narrative  . None    Outpatient Encounter Medications as of 02/23/2018  Medication Sig  . anastrozole (ARIMIDEX) 1 MG tablet TAKE 1 TABLET BY MOUTH ONCE DAILY  . aspirin 81 MG tablet Take 81 mg by mouth daily.  Marland Kitchen azelastine (ASTELIN) 0.1 % nasal spray USE 2 SPRAYS IN EACH  NOSTRIL TWO TIMES DAILY AS  DIRECTED  . Calcium Carbonate-Vitamin D (CALCIUM 600 + D PO) Take 600 mg  by mouth 2 (two) times daily.  . cholecalciferol (VITAMIN D) 1000 UNITS tablet Take 2,000 Units by mouth daily.   Marland Kitchen CINNAMON PO Take by mouth 2 (two) times daily.  . fluticasone (FLONASE) 50 MCG/ACT nasal spray USE 2 SPRAYS IN EACH  NOSTRIL DAILY  . gabapentin (NEURONTIN) 100 MG capsule TAKE 2 CAPSULES BY MOUTH AT BEDTIME  . methocarbamol (ROBAXIN) 500 MG tablet Take 1 tablet (500 mg total) by mouth at bedtime as needed for muscle spasms.  . Misc Natural Products (TART CHERRY ADVANCED) CAPS Take 1 capsule by mouth 2 (two) times daily. Reported on 03/04/2016  . Multiple Vitamin (MULTIVITAMIN) tablet Take 1 tablet by mouth daily.  . RABEprazole (ACIPHEX) 20 MG tablet Take 1 tablet (20 mg total) by mouth 2 (two) times daily.  . rosuvastatin (CRESTOR) 5 MG tablet TAKE 1 TABLET BY MOUTH  DAILY  . sucralfate (CARAFATE) 1 g tablet Take 1 tablet (1 g total) by mouth  2 (two) times daily.  Marland Kitchen triamterene-hydrochlorothiazide (DYAZIDE) 37.5-25 MG capsule Take 1 each (1 capsule total) by mouth daily.  . [DISCONTINUED] cetirizine (ZYRTEC) 10 MG tablet Take 10 mg by mouth as needed for allergies.  . [DISCONTINUED] fexofenadine (ALLEGRA) 180 MG tablet Take 180 mg by mouth as needed.   . [DISCONTINUED] triamterene-hydrochlorothiazide (DYAZIDE) 37.5-25 MG capsule Take 1 each (1 capsule total) by mouth daily.  Marland Kitchen b complex vitamins tablet Take 1 tablet by mouth daily.  Marland Kitchen levocetirizine (XYZAL) 5 MG tablet Take 1 tablet (5 mg total) by mouth every evening.  . Zoledronic Acid (ZOMETA IV) Inject into the vein. Every 6 months   No facility-administered encounter medications on file as of 02/23/2018.     Review of Systems  Constitutional: Negative for appetite change and unexpected weight change.  HENT: Positive for congestion and postnasal drip. Negative for sinus pressure.   Respiratory: Negative for cough, chest tightness and shortness of breath.   Cardiovascular: Negative for chest pain, palpitations and leg swelling.  Gastrointestinal: Negative for abdominal pain, diarrhea, nausea and vomiting.  Genitourinary: Negative for difficulty urinating and dysuria.  Musculoskeletal: Negative for joint swelling.       Back is better.    Skin: Negative for color change and rash.  Neurological: Negative for dizziness, light-headedness and headaches.  Psychiatric/Behavioral: Negative for agitation and dysphoric mood.       Objective:    Physical Exam  Constitutional: She appears well-developed and well-nourished. No distress.  HENT:  Nose: Nose normal.  Mouth/Throat: Oropharynx is clear and moist.  Neck: Neck supple. No thyromegaly present.  Cardiovascular: Normal rate and regular rhythm.  Pulmonary/Chest: Breath sounds normal. No respiratory distress. She has no wheezes.  Abdominal: Soft. Bowel sounds are normal. There is no tenderness.  Musculoskeletal: She exhibits  no edema or tenderness.  Lymphadenopathy:    She has no cervical adenopathy.  Skin: No rash noted. No erythema.  Psychiatric: She has a normal mood and affect. Her behavior is normal.    BP 122/60 (BP Location: Left Arm, Patient Position: Sitting, Cuff Size: Normal)   Pulse 66   Temp 98.1 F (36.7 C) (Oral)   Resp 16   Wt 145 lb 12.8 oz (66.1 kg)   SpO2 94%   BMI 28.47 kg/m  Wt Readings from Last 3 Encounters:  02/23/18 145 lb 12.8 oz (66.1 kg)  10/14/17 147 lb (66.7 kg)  05/02/17 148 lb 1.9 oz (67.2 kg)     Lab Results  Component Value Date  WBC 4.8 10/05/2017   HGB 13.9 10/05/2017   HCT 41.3 10/05/2017   PLT 226.0 10/05/2017   GLUCOSE 89 02/21/2018   CHOL 177 02/21/2018   TRIG 105.0 02/21/2018   HDL 66.50 02/21/2018   LDLDIRECT 146.9 01/31/2014   LDLCALC 89 02/21/2018   ALT 14 02/21/2018   AST 15 02/21/2018   NA 139 02/21/2018   K 3.7 02/21/2018   CL 102 02/21/2018   CREATININE 0.93 02/21/2018   BUN 19 02/21/2018   CO2 31 02/21/2018   TSH 2.01 10/05/2017   HGBA1C 6.1 02/21/2018       Assessment & Plan:   Problem List Items Addressed This Visit    Allergic rhinitis    Continue flonase and astelin.  Change her antihistamin to xyzal.        Breast cancer ([place])    On arimidex.  Followed by Dr Stasia Cavalier and Dr Harden Mo.        GERD (gastroesophageal reflux disease)    Controlled on aciphex and carafate.  Followed by GI.        Hypercholesterolemia    On crestor.  Low cholesterol diet and exercise.  Follow lipid panel and liver function tests.        Relevant Medications   triamterene-hydrochlorothiazide (DYAZIDE) 37.5-25 MG capsule   Other Relevant Orders   Hepatic function panel   Lipid panel   Hyperglycemia    Low carb diet and exercise.  Follow met b and a1c.        Relevant Orders   Hemoglobin A1c   Hypertension    Blood pressure under good control.  Continue same medication regimen.  Follow pressures.  Follow metabolic panel.          Relevant Medications   triamterene-hydrochlorothiazide (DYAZIDE) 37.5-25 MG capsule   Other Relevant Orders   Basic metabolic panel   Obstructive sleep apnea    Continue cpap.        Other Visit Diagnoses    Leg lesion    -  Primary   Relevant Orders   Ambulatory referral to Dermatology       Einar Pheasant, MD

## 2018-02-25 ENCOUNTER — Encounter: Payer: Self-pay | Admitting: Internal Medicine

## 2018-02-25 NOTE — Assessment & Plan Note (Signed)
On crestor.  Low cholesterol diet and exercise.  Follow lipid panel and liver function tests.   

## 2018-02-25 NOTE — Assessment & Plan Note (Signed)
Blood pressure under good control.  Continue same medication regimen.  Follow pressures.  Follow metabolic panel.   

## 2018-02-25 NOTE — Assessment & Plan Note (Signed)
Low carb diet and exercise.  Follow met b and a1c.   

## 2018-02-25 NOTE — Assessment & Plan Note (Signed)
Controlled on aciphex and carafate.  Followed by GI.

## 2018-02-25 NOTE — Assessment & Plan Note (Signed)
Continue flonase and astelin.  Change her antihistamin to xyzal.

## 2018-02-25 NOTE — Assessment & Plan Note (Signed)
Continue cpap.  

## 2018-02-25 NOTE — Assessment & Plan Note (Signed)
On arimidex.  Followed by Dr Stasia Cavalier and Dr Harden Mo.

## 2018-04-20 ENCOUNTER — Other Ambulatory Visit: Payer: Self-pay | Admitting: Internal Medicine

## 2018-05-03 ENCOUNTER — Ambulatory Visit (INDEPENDENT_AMBULATORY_CARE_PROVIDER_SITE_OTHER): Payer: Medicare Other

## 2018-05-03 VITALS — BP 136/70 | HR 70 | Temp 98.5°F | Resp 15 | Ht 59.0 in | Wt 149.0 lb

## 2018-05-03 DIAGNOSIS — Z0000 Encounter for general adult medical examination without abnormal findings: Secondary | ICD-10-CM

## 2018-05-03 NOTE — Progress Notes (Addendum)
Subjective:   Carla Ryan is a 72 y.o. female who presents for Medicare Annual (Subsequent) preventive examination.  Review of Systems:  No ROS.  Medicare Wellness Visit. Additional risk factors are reflected in the social history. Cardiac Risk Factors include: advanced age (>82mn, >>68women);hypertension     Objective:     Vitals: BP 136/70 (BP Location: Left Arm, Patient Position: Sitting, Cuff Size: Normal)   Pulse 70   Temp 98.5 F (36.9 C) (Oral)   Resp 15   Ht 4' 11"  (1.499 m)   Wt 149 lb (67.6 kg)   BMI 30.09 kg/m   Body mass index is 30.09 kg/m.  Advanced Directives 05/03/2018 05/02/2017 11/07/2015  Does Patient Have a Medical Advance Directive? Yes Yes Yes  Type of [place] Healthcare Power of [place] Does patient want to make changes to medical advance directive? No - Patient declined No - Patient declined No - Patient declined  Copy of [place] Chart? No - copy requested - No - copy requested    Tobacco Social History   Tobacco Use  Smoking Status Never Smoker  Smokeless Tobacco Never Used     Counseling given: Not Answered   Clinical Intake:  Pre-visit preparation completed: Yes  Pain : No/denies pain     Nutritional Status: BMI 25 -29 Overweight Diabetes: No  How often do you need to have someone help you when you read instructions, pamphlets, or other written materials from your doctor or pharmacy?: 1 - Never  Interpreter Needed?: No     Past Medical History:  Diagnosis Date  . Abdominal hernia   . Anxiety    situational stress and anxiety  . [place]   . GERD (gastroesophageal reflux disease)   . Hiatal hernia   . Hypercholesterolemia   . Hypertension   . Osteoarthritis    scoliosis, degenerative disc dz, knee, carpal tunnel   Past Surgical History:  Procedure Laterality Date  . ABDOMINAL HYSTERECTOMY   07/2000  . APPENDECTOMY  2000  . BREAST BIOPSY Bilateral 2014  . BREAST SURGERY Right 2015   lumpectomy  . CARPAL TUNNEL RELEASE    . COLONOSCOPY  2011   MLoistine Simas [name]: Normal exam  . HERNIA REPAIR  02/10/15  . Laparoscopic Repair of Lateral Abdominal Wall Hernia  02/10/15  . UPPER GI ENDOSCOPY  05/10/2014   MLoistine Simas [name] Bile gastritis, medium hiatal hernia. Incomplete visualization of the cardia and fundus.   Family History  Problem Relation Age of Onset  . Diabetes Mother   . Multiple myeloma Mother   . Diabetes Brother   . Diabetes Sister        x2  . Hypertension Brother   . Multiple myeloma Father    Social History   Socioeconomic History  . Marital status: Married    Spouse name: Not on file  . Number of children: 2  . Years of education: Not on file  . Highest education level: Not on file  Occupational History  . Not on file  Social Needs  . Financial resource strain: Not hard at all  . Food insecurity:    Worry: Never true    Inability: Never true  . Transportation needs:    Medical: No    Non-medical: No  Tobacco Use  . Smoking status: Never Smoker  . Smokeless tobacco: Never Used  Substance and Sexual  Activity  . Alcohol use: Yes    Alcohol/week: 0.0 oz    Comment: wine occasionally  . Drug use: No  . Sexual activity: Yes  Lifestyle  . Physical activity:    Days per week: Not on file    Minutes per session: Not on file  . Stress: Not at all  Relationships  . Social connections:    Talks on phone: Not on file    Gets together: Not on file    Attends religious service: Not on file    Active member of club or organization: Not on file    Attends meetings of clubs or organizations: Not on file    Relationship status: Not on file  Other Topics Concern  . Not on file  Social History Narrative  . Not on file    Outpatient Encounter Medications as of 05/03/2018  Medication Sig  . anastrozole (ARIMIDEX) 1 MG tablet TAKE 1 TABLET BY  MOUTH ONCE DAILY  . aspirin 81 MG tablet Take 81 mg by mouth daily.  Marland Kitchen azelastine (ASTELIN) 0.1 % nasal spray USE 2 SPRAYS IN EACH  NOSTRIL TWO TIMES DAILY AS  DIRECTED  . b complex vitamins tablet Take 1 tablet by mouth daily.  . Calcium Carbonate-Vitamin D (CALCIUM 600 + D PO) Take 600 mg by mouth 2 (two) times daily.  . cholecalciferol (VITAMIN D) 1000 UNITS tablet Take 2,000 Units by mouth daily.   Marland Kitchen CINNAMON PO Take by mouth 2 (two) times daily.  . fluticasone (FLONASE) 50 MCG/ACT nasal spray USE 2 SPRAYS IN EACH  NOSTRIL DAILY  . gabapentin (NEURONTIN) 100 MG capsule TAKE 2 CAPSULES BY MOUTH AT BEDTIME  . levocetirizine (XYZAL) 5 MG tablet Take 1 tablet (5 mg total) by mouth every evening.  . methocarbamol (ROBAXIN) 500 MG tablet Take 1 tablet (500 mg total) by mouth at bedtime as needed for muscle spasms.  . Misc Natural Products (TART CHERRY ADVANCED) CAPS Take 1 capsule by mouth 2 (two) times daily. Reported on 03/04/2016  . Multiple Vitamin (MULTIVITAMIN) tablet Take 1 tablet by mouth daily.  . RABEprazole (ACIPHEX) 20 MG tablet Take 1 tablet (20 mg total) by mouth 2 (two) times daily.  . rosuvastatin (CRESTOR) 5 MG tablet TAKE 1 TABLET BY MOUTH  DAILY  . sucralfate (CARAFATE) 1 g tablet Take 1 tablet (1 g total) by mouth 2 (two) times daily.  Marland Kitchen triamterene-hydrochlorothiazide (DYAZIDE) 37.5-25 MG capsule Take 1 each (1 capsule total) by mouth daily.  . Zoledronic Acid (ZOMETA IV) Inject into the vein. Every 6 months   No facility-administered encounter medications on file as of 05/03/2018.     Activities of Daily Living In your present state of health, do you have any difficulty performing the following activities: 05/03/2018  Hearing? N  Vision? N  Difficulty concentrating or making decisions? N  Walking or climbing stairs? N  Dressing or bathing? N  Doing errands, shopping? N  Preparing Food and eating ? N  Using the Toilet? N  In the past six months, have you accidently  leaked urine? N  Do you have problems with loss of bowel control? N  Managing your Medications? N  Managing your Finances? N  Housekeeping or managing your Housekeeping? N  Some recent data might be hidden    Patient Care Team: Einar Pheasant, MD as PCP - General (Internal Medicine) Einar Pheasant, MD (Internal Medicine) Bary Castilla Forest Gleason, MD (General Surgery)    Assessment:   This is a routine  wellness examination for Shadara. The goal of the wellness visit is to assist the patient how to close the gaps in care and create a preventative care plan for the patient.   The roster of all physicians providing medical care to patient is listed in the Snapshot section of the chart.  Taking calcium VIT D as appropriate/Osteoporosis risk reviewed.    Safety issues reviewed; Smoke and carbon monoxide detectors in the home. No firearms in the home. Wears seatbelts when driving or riding with others. No violence in the home.  They do not have excessive sun exposure.  Discussed the need for sun protection: hats, long sleeves and the use of sunscreen if there is significant sun exposure.  Patient is alert, normal appearance, oriented to person/place/and time.  Correctly identified the president of the [place] and recalls of 5/5 words. Performs simple calculations and can read correct time from watch face. Displays appropriate judgement.  No new identified risk were noted.  No failures at ADL's or IADL's.    BMI- discussed the importance of a healthy diet, water intake and the benefits of aerobic exercise. Educational material provided.   24 hour diet recall: Low carb diet  Dental- UTD  Eye- Visual acuity not assessed per patient preference since they have regular follow up with the ophthalmologist.  Wears corrective lenses.  Sleep patterns- Sleeps 6-8 hours at night.  Wakes feeling rested. CPAP in use.   Health maintenance gaps- closed.  Patient Concerns: None at this time. Follow up  with PCP as needed.  Exercise Activities and Dietary recommendations Current Exercise Habits: The patient does not participate in regular exercise at present  Goals    . Increase physical activity     Water aerobics       Fall Risk Fall Risk  05/03/2018 05/02/2017 09/03/2016 11/07/2015 10/08/2014  Falls in the past year? No No No No No   Depression Screen PHQ 2/9 Scores 05/03/2018 05/02/2017 09/03/2016 11/07/2015  PHQ - 2 Score 0 0 0 0  PHQ- 9 Score - 0 - -     Cognitive Function MMSE - Mini Mental State Exam 05/03/2018 05/02/2017 11/07/2015  Orientation to time 5 5 5   Orientation to Place 5 5 5   Registration 3 3 3   Attention/ Calculation 5 5 5   Recall 3 3 3   Language- name 2 objects 2 2 2   Language- repeat 1 1 1   Language- follow 3 step command 3 3 3   Language- read & follow direction 1 1 1   Write a sentence 1 1 1   Copy design 1 1 1   Total score 30 30 30         Immunization History  Administered Date(s) Administered  . Influenza Split 10/25/2012  . Influenza, High Dose Seasonal PF 11/07/2015, 09/03/2016, 10/05/2017  . Influenza,inj,Quad PF,6+ Mos 09/28/2013, 10/08/2014  . Pneumococcal Conjugate-13 11/27/2013  . Pneumococcal Polysaccharide-23 11/05/2015  . Td 12/04/2015  . Tdap 12/20/2004  . Zoster 12/20/2008   Screening Tests Health Maintenance  Topic Date Due  . INFLUENZA VACCINE  07/20/2018  . MAMMOGRAM  01/12/2019  . COLONOSCOPY  10/23/2020  . TETANUS/TDAP  12/03/2025  . DEXA SCAN  Completed  . Hepatitis C Screening  Completed  . PNA vac Low Risk Adult  Completed      Plan:    End of life planning; Advance aging; Advanced directives discussed. Copy of current HCPOA/Living Will requested.    I have personally reviewed and noted the following in the patient's chart:   .  Medical and social history . Use of alcohol, tobacco or illicit drugs  . Current medications and supplements . Functional ability and status . Nutritional status . Physical  activity . Advanced directives . List of other physicians . Hospitalizations, surgeries, and ER visits in previous 12 months . Vitals . Screenings to include cognitive, depression, and falls . Referrals and appointments  In addition, I have reviewed and discussed with patient certain preventive protocols, quality metrics, and best practice recommendations. A written personalized care plan for preventive services as well as general preventive health recommendations were provided to patient.     Varney Biles, LPN  [id number]   Reviewed above information.  Agree with assessment and plan.    Dr Nicki Reaper

## 2018-05-03 NOTE — Patient Instructions (Addendum)
  Ms. Linch , Thank you for taking time to come for your Medicare Wellness Visit. I appreciate your ongoing commitment to your health goals. Please review the following plan we discussed and let me know if I can assist you in the future.   Follow up as needed.    Bring a copy of your [place] and/or Living Will to be scanned into chart.  Have a great day!  These are the goals we discussed: Goals    . Increase physical activity     Water aerobics       This is a list of the screening recommended for you and due dates:  Health Maintenance  Topic Date Due  . Flu Shot  07/20/2018  . Mammogram  01/12/2019  . Colon Cancer Screening  10/23/2020  . Tetanus Vaccine  12/03/2025  . DEXA scan (bone density measurement)  Completed  .  Hepatitis C: One time screening is recommended by Center for Disease Control  (CDC) for  adults born from [birth year] through 1967.   Completed  . Pneumonia vaccines  Completed

## 2018-06-23 ENCOUNTER — Other Ambulatory Visit (INDEPENDENT_AMBULATORY_CARE_PROVIDER_SITE_OTHER): Payer: Medicare Other

## 2018-06-23 DIAGNOSIS — I10 Essential (primary) hypertension: Secondary | ICD-10-CM

## 2018-06-23 DIAGNOSIS — R739 Hyperglycemia, unspecified: Secondary | ICD-10-CM

## 2018-06-23 DIAGNOSIS — E7800 Pure hypercholesterolemia, unspecified: Secondary | ICD-10-CM

## 2018-06-23 LAB — BASIC METABOLIC PANEL
BUN: 19 mg/dL (ref 6–23)
CHLORIDE: 103 meq/L (ref 96–112)
CO2: 29 meq/L (ref 19–32)
Calcium: 9.4 mg/dL (ref 8.4–10.5)
Creatinine, Ser: 0.85 mg/dL (ref 0.40–1.20)
GFR: 69.42 mL/min (ref >60.00)
Glucose, Bld: 104 mg/dL — ABNORMAL HIGH (ref 70–99)
POTASSIUM: 3.7 meq/L (ref 3.5–5.1)
SODIUM: 139 meq/L (ref 135–145)

## 2018-06-23 LAB — LIPID PANEL
CHOL/HDL RATIO: 3
CHOLESTEROL: 178 mg/dL (ref 0–200)
HDL: 63.8 mg/dL (ref >39.00)
LDL CALC: 95 mg/dL (ref 0–99)
NonHDL: 113.94
TRIGLYCERIDES: 93 mg/dL (ref 0.0–149.0)
VLDL: 18.6 mg/dL (ref 0.0–40.0)

## 2018-06-23 LAB — HEPATIC FUNCTION PANEL
ALT: 14 U/L (ref 0–35)
AST: 17 U/L (ref 0–37)
Albumin: 4.1 g/dL (ref 3.5–5.2)
Alkaline Phosphatase: 46 U/L (ref 39–117)
Bilirubin, Direct: 0.1 mg/dL (ref 0.0–0.3)
TOTAL PROTEIN: 6.6 g/dL (ref 6.0–8.3)
Total Bilirubin: 0.5 mg/dL (ref 0.2–1.2)

## 2018-06-23 LAB — HEMOGLOBIN A1C: Hgb A1c MFr Bld: 6.1 % (ref 4.6–6.5)

## 2018-06-26 ENCOUNTER — Encounter: Payer: Self-pay | Admitting: Internal Medicine

## 2018-06-27 ENCOUNTER — Ambulatory Visit: Payer: Medicare Other | Admitting: Internal Medicine

## 2018-06-27 ENCOUNTER — Encounter: Payer: Self-pay | Admitting: Internal Medicine

## 2018-06-27 DIAGNOSIS — K219 Gastro-esophageal reflux disease without esophagitis: Secondary | ICD-10-CM

## 2018-06-27 DIAGNOSIS — M545 Low back pain: Secondary | ICD-10-CM

## 2018-06-27 DIAGNOSIS — G4733 Obstructive sleep apnea (adult) (pediatric): Secondary | ICD-10-CM

## 2018-06-27 DIAGNOSIS — C50919 Malignant neoplasm of unspecified site of unspecified female breast: Secondary | ICD-10-CM

## 2018-06-27 DIAGNOSIS — R739 Hyperglycemia, unspecified: Secondary | ICD-10-CM

## 2018-06-27 DIAGNOSIS — K625 Hemorrhage of anus and rectum: Secondary | ICD-10-CM

## 2018-06-27 DIAGNOSIS — E7800 Pure hypercholesterolemia, unspecified: Secondary | ICD-10-CM

## 2018-06-27 DIAGNOSIS — I10 Essential (primary) hypertension: Secondary | ICD-10-CM

## 2018-06-27 LAB — CBC WITH DIFFERENTIAL/PLATELET
BASOS PCT: 1 % (ref 0.0–3.0)
Basophils Absolute: 0.1 10*3/uL (ref 0.0–0.1)
EOS PCT: 4.2 % (ref 0.0–5.0)
Eosinophils Absolute: 0.2 10*3/uL (ref 0.0–0.7)
HCT: 40.9 % (ref 36.0–46.0)
Hemoglobin: 13.7 g/dL (ref 12.0–15.0)
Lymphocytes Relative: 27.3 % (ref 12.0–46.0)
Lymphs Abs: 1.4 10*3/uL (ref 0.7–4.0)
MCHC: 33.6 g/dL (ref 30.0–36.0)
MCV: 90.8 fl (ref 78.0–100.0)
MONO ABS: 0.4 10*3/uL (ref 0.1–1.0)
MONOS PCT: 7.3 % (ref 3.0–12.0)
Neutro Abs: 3 10*3/uL (ref 1.4–7.7)
Neutrophils Relative %: 60.2 % (ref 43.0–77.0)
Platelets: 236 10*3/uL (ref 150.0–400.0)
RBC: 4.51 Mil/uL (ref 3.87–5.11)
RDW: 14.7 % (ref 11.5–15.5)
WBC: 5 10*3/uL (ref 4.0–10.5)

## 2018-06-27 NOTE — Assessment & Plan Note (Signed)
Low carb diet and exercise.  Discussed with her today.  Follow met b and a1c.   Lab Results  Component Value Date   HGBA1C 6.1 06/23/2018

## 2018-06-27 NOTE — Assessment & Plan Note (Signed)
CPAP.  

## 2018-06-27 NOTE — Assessment & Plan Note (Signed)
On crestor.  Low cholesterol diet and exercise.  Follow lipid panel and liver function tests.   Lab Results  Component Value Date   CHOL 178 06/23/2018   HDL 63.80 06/23/2018   LDLCALC 95 06/23/2018   LDLDIRECT 146.9 01/31/2014   TRIG 93.0 06/23/2018   CHOLHDL 3 06/23/2018

## 2018-06-27 NOTE — Assessment & Plan Note (Signed)
On arimidex.  Followed by oncology.  Just evaluated.  Stable.  Recommended f/u in one year - 05/2019.

## 2018-06-27 NOTE — Assessment & Plan Note (Signed)
Has seen Dr Dwaine Gale.  Stable.

## 2018-06-27 NOTE — Assessment & Plan Note (Signed)
Followed by GI.  Controlled on aciphex and carafate.  If takes, controls.  If tries to decrease dose, symptoms return.  Follow.  Continue current regimen for now.  F/u with GI as outlined.

## 2018-06-27 NOTE — Assessment & Plan Note (Signed)
Noticed blood when she wiped - last week.  No further episodes.  No pain with bowel movement.  No straining.  Check cbc.  She  Declined rectal exam.  Upper symptoms controlled if take medication.  Upper symptoms return if tries to decrease medication. Refer to GI for rectal bleeding and to f/u on her GERD.  Question of need for colonoscopy, etc.

## 2018-06-27 NOTE — Assessment & Plan Note (Signed)
Blood pressure slightly elevated today.  Recheck improved 138-140/78.  Have her spot check her pressure.  Send in readings over the next few weeks.  Hold on making adjustments.

## 2018-06-27 NOTE — Progress Notes (Signed)
Patient ID: Carla Ryan, female   DOB: 1946-01-12, 72 y.o.   MRN: [id number]   Subjective:    Patient ID: Carla Ryan, female    DOB: Oct 01, 1946, 72 y.o.   MRN: [id number]  HPI  Patient here for a scheduled follow up.  She is followed by oncology for her history of breast cancer.  On Femara since 08/2014.  Also receiving zometa infusions.  Last in 10/2018.  They would like to continue aromatase inhibitor for total of 10 years.  Stable bone density 07/2017.  Plans for repeat 08/2018.  Will plan for prolia if worsened.  Reported increased joint pain on femara.  Was transitioned to arimidex.  Has done ok on this.  Felt stable from her breast cancer and recommended f/u in one year.  Is followed by GI for her acid reflux.  Has been controlled on carafate and aciphex.  Last evaluated 06/2017.  Recommended f/u in one year.  She reports noticing some blood when she wiped - last week.  Has not noticed since.  No pain with bm.  No abdominal pain.  No straining.  Discussed referral back to GI for rectal bleeding.  She is in agreement.  No chest pain.  No sob.  No acid reflux if takes the medication.  Overall she feels she is doing well.  Has noticed some ankle swelling.  States is always worse in summer.     Past Medical History:  Diagnosis Date  . Abdominal hernia   . Anxiety    situational stress and anxiety  . Cancer ([place]) 2015   breast   . GERD (gastroesophageal reflux disease)   . Hiatal hernia   . Hypercholesterolemia   . Hypertension   . Osteoarthritis    scoliosis, degenerative disc dz, knee, carpal tunnel   Past Surgical History:  Procedure Laterality Date  . ABDOMINAL HYSTERECTOMY  07/2000  . APPENDECTOMY  2000  . BREAST BIOPSY Bilateral 2014  . BREAST SURGERY Right 2015   lumpectomy  . CARPAL TUNNEL RELEASE    . COLONOSCOPY  2011   [name], [name]: Normal exam  . HERNIA REPAIR  02/10/15  . Laparoscopic Repair of Lateral Abdominal Wall Hernia  02/10/15  . UPPER GI ENDOSCOPY   05/10/2014   [name], [name] Bile gastritis, medium hiatal hernia. Incomplete visualization of the cardia and fundus.   Family History  Problem Relation Age of Onset  . Diabetes Mother   . Multiple myeloma Mother   . Diabetes Brother   . Diabetes Sister        x2  . Hypertension Brother   . Multiple myeloma Father    Social History   Socioeconomic History  . Marital status: Married    Spouse name: Not on file  . Number of children: 2  . Years of education: Not on file  . Highest education level: Not on file  Occupational History  . Not on file  Social Needs  . Financial resource strain: Not hard at all  . Food insecurity:    Worry: Never true    Inability: Never true  . Transportation needs:    Medical: No    Non-medical: No  Tobacco Use  . Smoking status: Never Smoker  . Smokeless tobacco: Never Used  Substance and Sexual Activity  . Alcohol use: Yes    Alcohol/week: 0.0 oz    Comment: wine occasionally  . Drug use: No  . Sexual activity: Yes  Lifestyle  . Physical activity:  Days per week: Not on file    Minutes per session: Not on file  . Stress: Not at all  Relationships  . Social connections:    Talks on phone: Not on file    Gets together: Not on file    Attends religious service: Not on file    Active member of club or organization: Not on file    Attends meetings of clubs or organizations: Not on file    Relationship status: Not on file  Other Topics Concern  . Not on file  Social History Narrative  . Not on file    Outpatient Encounter Medications as of 06/27/2018  Medication Sig  . anastrozole (ARIMIDEX) 1 MG tablet TAKE 1 TABLET BY MOUTH ONCE DAILY  . aspirin 81 MG tablet Take 81 mg by mouth daily.  Marland Kitchen azelastine (ASTELIN) 0.1 % nasal spray USE 2 SPRAYS IN EACH  NOSTRIL TWO TIMES DAILY AS  DIRECTED  . Calcium Carbonate-Vitamin D (CALCIUM 600 + D PO) Take 600 mg by mouth 2 (two) times daily.  . cholecalciferol (VITAMIN D) 1000 UNITS  tablet Take 2,000 Units by mouth daily.   Marland Kitchen CINNAMON PO Take by mouth 2 (two) times daily.  . fexofenadine-pseudoephedrine (ALLEGRA-D 24) 180-240 MG 24 hr tablet Take 1 tablet by mouth daily.  . fluticasone (FLONASE) 50 MCG/ACT nasal spray USE 2 SPRAYS IN EACH  NOSTRIL DAILY  . gabapentin (NEURONTIN) 100 MG capsule TAKE 2 CAPSULES BY MOUTH AT BEDTIME  . methocarbamol (ROBAXIN) 500 MG tablet Take 1 tablet (500 mg total) by mouth at bedtime as needed for muscle spasms.  . Misc Natural Products (TART CHERRY ADVANCED) CAPS Take 1 capsule by mouth 2 (two) times daily. Reported on 03/04/2016  . Multiple Vitamin (MULTIVITAMIN) tablet Take 1 tablet by mouth daily.  . RABEprazole (ACIPHEX) 20 MG tablet Take 1 tablet (20 mg total) by mouth 2 (two) times daily.  . rosuvastatin (CRESTOR) 5 MG tablet TAKE 1 TABLET BY MOUTH  DAILY  . sucralfate (CARAFATE) 1 g tablet Take 1 tablet (1 g total) by mouth 2 (two) times daily.  Marland Kitchen triamterene-hydrochlorothiazide (DYAZIDE) 37.5-25 MG capsule Take 1 each (1 capsule total) by mouth daily.  . Zoledronic Acid (ZOMETA IV) Inject into the vein. Every 6 months  . [DISCONTINUED] b complex vitamins tablet Take 1 tablet by mouth daily.  . [DISCONTINUED] levocetirizine (XYZAL) 5 MG tablet Take 1 tablet (5 mg total) by mouth every evening. (Patient not taking: Reported on 06/27/2018)   No facility-administered encounter medications on file as of 06/27/2018.     Review of Systems  Constitutional: Negative for appetite change and unexpected weight change.  HENT: Negative for congestion and sinus pressure.   Respiratory: Negative for cough, chest tightness and shortness of breath.   Cardiovascular: Negative for chest pain, palpitations and leg swelling.  Gastrointestinal: Negative for abdominal pain, diarrhea, nausea and vomiting.       Rectal bleeding as outlined.    Genitourinary: Negative for difficulty urinating and dysuria.  Musculoskeletal: Negative for joint swelling and  myalgias.  Skin: Negative for color change and rash.  Neurological: Negative for dizziness, light-headedness and headaches.  Psychiatric/Behavioral: Negative for agitation and dysphoric mood.       Objective:    Physical Exam  Constitutional: She appears well-developed and well-nourished. No distress.  HENT:  Nose: Nose normal.  Mouth/Throat: Oropharynx is clear and moist.  Neck: Neck supple. No thyromegaly present.  Cardiovascular: Normal rate and regular rhythm.  Pulmonary/Chest: Breath  sounds normal. No respiratory distress. She has no wheezes.  Abdominal: Soft. Bowel sounds are normal. There is no tenderness.  Genitourinary:  Genitourinary Comments: She declined rectal exam.   Musculoskeletal: She exhibits no edema or tenderness.  Lymphadenopathy:    She has no cervical adenopathy.  Skin: No rash noted. No erythema.  Psychiatric: She has a normal mood and affect. Her behavior is normal.    BP (!) 146/62 (BP Location: Left Arm, Patient Position: Sitting, Cuff Size: Normal)   Pulse 70   Temp 98.1 F (36.7 C) (Oral)   Resp 15   Wt 147 lb 6 oz (66.8 kg)   SpO2 97%   BMI 29.77 kg/m  Wt Readings from Last 3 Encounters:  06/27/18 147 lb 6 oz (66.8 kg)  05/03/18 149 lb (67.6 kg)  02/23/18 145 lb 12.8 oz (66.1 kg)     Lab Results  Component Value Date   WBC 4.8 10/05/2017   HGB 13.9 10/05/2017   HCT 41.3 10/05/2017   PLT 226.0 10/05/2017   GLUCOSE 104 (H) 06/23/2018   CHOL 178 06/23/2018   TRIG 93.0 06/23/2018   HDL 63.80 06/23/2018   LDLDIRECT 146.9 01/31/2014   LDLCALC 95 06/23/2018   ALT 14 06/23/2018   AST 17 06/23/2018   NA 139 06/23/2018   K 3.7 06/23/2018   CL 103 06/23/2018   CREATININE 0.85 06/23/2018   BUN 19 06/23/2018   CO2 29 06/23/2018   TSH 2.01 10/05/2017   HGBA1C 6.1 06/23/2018       Assessment & Plan:   Problem List Items Addressed This Visit    Back pain    Has seen Dr Dwaine Gale.  Stable.        Breast cancer ([place])    On  arimidex.  Followed by oncology.  Just evaluated.  Stable.  Recommended f/u in one year - 05/2019.        GERD (gastroesophageal reflux disease)    Followed by GI.  Controlled on aciphex and carafate.  If takes, controls.  If tries to decrease dose, symptoms return.  Follow.  Continue current regimen for now.  F/u with GI as outlined.        Relevant Orders   Ambulatory referral to Gastroenterology   Hypercholesterolemia    On crestor.  Low cholesterol diet and exercise.  Follow lipid panel and liver function tests.   Lab Results  Component Value Date   CHOL 178 06/23/2018   HDL 63.80 06/23/2018   LDLCALC 95 06/23/2018   LDLDIRECT 146.9 01/31/2014   TRIG 93.0 06/23/2018   CHOLHDL 3 06/23/2018        Hyperglycemia    Low carb diet and exercise.  Discussed with her today.  Follow met b and a1c.   Lab Results  Component Value Date   HGBA1C 6.1 06/23/2018        Hypertension    Blood pressure slightly elevated today.  Recheck improved 138-140/78.  Have her spot check her pressure.  Send in readings over the next few weeks.  Hold on making adjustments.        Obstructive sleep apnea    CPAP.       Rectal bleeding    Noticed blood when she wiped - last week.  No further episodes.  No pain with bowel movement.  No straining.  Check cbc.  She  Declined rectal exam.  Upper symptoms controlled if take medication.  Upper symptoms return if tries to decrease medication. Refer to  GI for rectal bleeding and to f/u on her GERD.  Question of need for colonoscopy, etc.        Relevant Orders   CBC with Differential/Platelet   Ambulatory referral to Gastroenterology       Einar Pheasant, MD

## 2018-06-28 ENCOUNTER — Encounter: Payer: Self-pay | Admitting: Internal Medicine

## 2018-07-27 ENCOUNTER — Other Ambulatory Visit: Payer: Self-pay | Admitting: Gastroenterology

## 2018-07-27 DIAGNOSIS — R1013 Epigastric pain: Secondary | ICD-10-CM

## 2018-08-09 ENCOUNTER — Other Ambulatory Visit: Payer: Self-pay | Admitting: Internal Medicine

## 2018-08-11 ENCOUNTER — Ambulatory Visit
Admission: RE | Admit: 2018-08-11 | Discharge: 2018-08-11 | Disposition: A | Discharge status: Home / Self Care | Payer: Medicare Other | Source: Ambulatory Visit | Attending: Gastroenterology | Admitting: Gastroenterology

## 2018-08-11 DIAGNOSIS — R1013 Epigastric pain: Secondary | ICD-10-CM

## 2018-09-29 LAB — COLOGUARD: Cologuard: NEGATIVE

## 2018-10-05 ENCOUNTER — Other Ambulatory Visit: Payer: Self-pay | Admitting: Gastroenterology

## 2018-10-05 DIAGNOSIS — R1013 Epigastric pain: Secondary | ICD-10-CM

## 2018-10-11 ENCOUNTER — Ambulatory Visit
Admission: RE | Admit: 2018-10-11 | Discharge: 2018-10-11 | Disposition: A | Discharge status: Home / Self Care | Payer: Medicare Other | Source: Ambulatory Visit | Attending: Gastroenterology | Admitting: Gastroenterology

## 2018-10-11 DIAGNOSIS — R1013 Epigastric pain: Secondary | ICD-10-CM | POA: Diagnosis present

## 2018-10-11 DIAGNOSIS — K228 Other specified diseases of esophagus: Secondary | ICD-10-CM | POA: Insufficient documentation

## 2018-10-11 DIAGNOSIS — K449 Diaphragmatic hernia without obstruction or gangrene: Secondary | ICD-10-CM | POA: Insufficient documentation

## 2018-10-12 ENCOUNTER — Ambulatory Visit (INDEPENDENT_AMBULATORY_CARE_PROVIDER_SITE_OTHER): Payer: Medicare Other

## 2018-10-12 DIAGNOSIS — Z23 Encounter for immunization: Secondary | ICD-10-CM

## 2018-10-12 NOTE — Progress Notes (Signed)
Pt is here today to get high dose flu shot. Given in LD pt tolerated well.

## 2018-10-27 ENCOUNTER — Other Ambulatory Visit: Payer: Self-pay | Admitting: Internal Medicine

## 2018-10-27 ENCOUNTER — Telehealth: Payer: Self-pay | Admitting: Radiology

## 2018-10-27 ENCOUNTER — Other Ambulatory Visit (INDEPENDENT_AMBULATORY_CARE_PROVIDER_SITE_OTHER): Payer: Medicare Other

## 2018-10-27 DIAGNOSIS — I10 Essential (primary) hypertension: Secondary | ICD-10-CM

## 2018-10-27 DIAGNOSIS — E7800 Pure hypercholesterolemia, unspecified: Secondary | ICD-10-CM

## 2018-10-27 DIAGNOSIS — R739 Hyperglycemia, unspecified: Secondary | ICD-10-CM

## 2018-10-27 LAB — LIPID PANEL
CHOL/HDL RATIO: 3
Cholesterol: 169 mg/dL (ref 0–200)
HDL: 62.9 mg/dL (ref >39.00)
LDL CALC: 84 mg/dL (ref 0–99)
NONHDL: 106.56
TRIGLYCERIDES: 112 mg/dL (ref 0.0–149.0)
VLDL: 22.4 mg/dL (ref 0.0–40.0)

## 2018-10-27 LAB — BASIC METABOLIC PANEL
BUN: 23 mg/dL (ref 6–23)
CALCIUM: 9.1 mg/dL (ref 8.4–10.5)
CO2: 29 mEq/L (ref 19–32)
Chloride: 104 mEq/L (ref 96–112)
Creatinine, Ser: 0.94 mg/dL (ref 0.40–1.20)
GFR: 61.75 mL/min (ref >60.00)
GLUCOSE: 101 mg/dL — AB (ref 70–99)
Potassium: 3.6 mEq/L (ref 3.5–5.1)
SODIUM: 140 meq/L (ref 135–145)

## 2018-10-27 LAB — HEPATIC FUNCTION PANEL
ALT: 16 U/L (ref 0–35)
AST: 18 U/L (ref 0–37)
Albumin: 4.1 g/dL (ref 3.5–5.2)
Alkaline Phosphatase: 44 U/L (ref 39–117)
BILIRUBIN DIRECT: 0.1 mg/dL (ref 0.0–0.3)
TOTAL PROTEIN: 6.8 g/dL (ref 6.0–8.3)
Total Bilirubin: 0.5 mg/dL (ref 0.2–1.2)

## 2018-10-27 LAB — TSH: TSH: 2.27 u[IU]/mL (ref 0.35–4.50)

## 2018-10-27 LAB — HEMOGLOBIN A1C: Hgb A1c MFr Bld: 6.1 % (ref 4.6–6.5)

## 2018-10-27 NOTE — Progress Notes (Signed)
Orders placed for f/u labs.  

## 2018-10-27 NOTE — Telephone Encounter (Signed)
Orders placed for f/u labs.  

## 2018-10-27 NOTE — Telephone Encounter (Signed)
Pt coming in for labs today , please place future orders. Thank you.  

## 2018-10-29 ENCOUNTER — Encounter: Payer: Self-pay | Admitting: Internal Medicine

## 2018-10-30 ENCOUNTER — Ambulatory Visit (INDEPENDENT_AMBULATORY_CARE_PROVIDER_SITE_OTHER): Payer: Medicare Other | Admitting: Internal Medicine

## 2018-10-30 ENCOUNTER — Ambulatory Visit (INDEPENDENT_AMBULATORY_CARE_PROVIDER_SITE_OTHER): Payer: Medicare Other

## 2018-10-30 ENCOUNTER — Encounter: Payer: Self-pay | Admitting: Internal Medicine

## 2018-10-30 VITALS — BP 138/86 | HR 85 | Temp 98.3°F | Resp 18 | Wt 147.0 lb

## 2018-10-30 DIAGNOSIS — Z0000 Encounter for general adult medical examination without abnormal findings: Secondary | ICD-10-CM | POA: Diagnosis not present

## 2018-10-30 DIAGNOSIS — R079 Chest pain, unspecified: Secondary | ICD-10-CM

## 2018-10-30 DIAGNOSIS — E7800 Pure hypercholesterolemia, unspecified: Secondary | ICD-10-CM

## 2018-10-30 DIAGNOSIS — M8580 Other specified disorders of bone density and structure, unspecified site: Secondary | ICD-10-CM

## 2018-10-30 DIAGNOSIS — G4733 Obstructive sleep apnea (adult) (pediatric): Secondary | ICD-10-CM

## 2018-10-30 DIAGNOSIS — C50919 Malignant neoplasm of unspecified site of unspecified female breast: Secondary | ICD-10-CM | POA: Diagnosis not present

## 2018-10-30 DIAGNOSIS — K219 Gastro-esophageal reflux disease without esophagitis: Secondary | ICD-10-CM

## 2018-10-30 DIAGNOSIS — R0602 Shortness of breath: Secondary | ICD-10-CM

## 2018-10-30 DIAGNOSIS — R739 Hyperglycemia, unspecified: Secondary | ICD-10-CM

## 2018-10-30 DIAGNOSIS — K449 Diaphragmatic hernia without obstruction or gangrene: Secondary | ICD-10-CM

## 2018-10-30 DIAGNOSIS — I10 Essential (primary) hypertension: Secondary | ICD-10-CM

## 2018-10-30 MED ORDER — IRBESARTAN 75 MG PO TABS: 75.0000 mg | ORAL_TABLET | Freq: Every day | ORAL | 1 refills | Status: DC

## 2018-10-30 NOTE — Progress Notes (Signed)
Patient ID: Carla Ryan, female   DOB: [birth year], 72 y.o.   MRN: [id number]   Subjective:    Patient ID: Carla Ryan, female    DOB: 09/22/1946, 72 y.o.   MRN: [id number]  HPI  Patient here for her physical exam.  She has been having problems with epigastric pain/chest discomfort.  Saw GI.  cologuard test - negative.  Ultrasound abdomen - negative.  Instructed to continue PPI and carafate.  Recently reevaluated by GI.  Described some persistent epigastric discomfort, chest discomfort and sob.  Note reviewed.  Saw cardiology.  Stress test is scheduled for 11/08/18.  GI recommended continuing PPI and carafate.  Also she has been using IBGARD - which has helped.  She has a history of hiatal hernia. Was questioning if this could be contributing.  Will have some cough.  Describes a tickle in her throat.  Feels acid reflux is controlled.  Uses nasal spray.  Uses cpap regularly.  Bowels stable.  Discussed bone density results and treatment.     Past Medical History:  Diagnosis Date  . Abdominal hernia   . Anxiety    situational stress and anxiety  . Cancer ([place]) 2015   breast   . GERD (gastroesophageal reflux disease)   . Hiatal hernia   . Hypercholesterolemia   . Hypertension   . Osteoarthritis    scoliosis, degenerative disc dz, knee, carpal tunnel   Past Surgical History:  Procedure Laterality Date  . ABDOMINAL HYSTERECTOMY  07/2000  . APPENDECTOMY  2000  . BREAST BIOPSY Bilateral 2014  . BREAST SURGERY Right 2015   lumpectomy  . CARPAL TUNNEL RELEASE    . COLONOSCOPY  2011   [name], [name]: Normal exam  . HERNIA REPAIR  02/10/15  . Laparoscopic Repair of Lateral Abdominal Wall Hernia  02/10/15  . UPPER GI ENDOSCOPY  05/10/2014   [name], [name] Bile gastritis, medium hiatal hernia. Incomplete visualization of the cardia and fundus.   Family History  Problem Relation Age of Onset  . Diabetes Mother   . Multiple myeloma Mother   . Diabetes Brother   .  Diabetes Sister        x2  . Hypertension Brother   . Multiple myeloma Father    Social History   Socioeconomic History  . Marital status: Married    Spouse name: Not on file  . Number of children: 2  . Years of education: Not on file  . Highest education level: Not on file  Occupational History  . Not on file  Social Needs  . Financial resource strain: Not hard at all  . Food insecurity:    Worry: Never true    Inability: Never true  . Transportation needs:    Medical: No    Non-medical: No  Tobacco Use  . Smoking status: Never Smoker  . Smokeless tobacco: Never Used  Substance and Sexual Activity  . Alcohol use: Yes    Alcohol/week: 0.0 standard drinks    Comment: wine occasionally  . Drug use: No  . Sexual activity: Yes  Lifestyle  . Physical activity:    Days per week: Not on file    Minutes per session: Not on file  . Stress: Not at all  Relationships  . Social connections:    Talks on phone: Not on file    Gets together: Not on file    Attends religious service: Not on file    Active member of club or organization: Not  on file    Attends meetings of clubs or organizations: Not on file    Relationship status: Not on file  Other Topics Concern  . Not on file  Social History Narrative  . Not on file    Outpatient Encounter Medications as of 10/30/2018  Medication Sig  . anastrozole (ARIMIDEX) 1 MG tablet TAKE 1 TABLET BY MOUTH ONCE DAILY  . aspirin 81 MG tablet Take 81 mg by mouth daily.  Marland Kitchen azelastine (ASTELIN) 0.1 % nasal spray USE 2 SPRAYS IN EACH  NOSTRIL TWO TIMES DAILY AS  DIRECTED  . Calcium Carbonate-Vitamin D (CALCIUM 600 + D PO) Take 600 mg by mouth 2 (two) times daily.  . cholecalciferol (VITAMIN D) 1000 UNITS tablet Take 2,000 Units by mouth daily.   Marland Kitchen CINNAMON PO Take by mouth 2 (two) times daily.  . fexofenadine-pseudoephedrine (ALLEGRA-D 24) 180-240 MG 24 hr tablet Take 1 tablet by mouth daily.  . fluticasone (FLONASE) 50 MCG/ACT nasal  spray USE 2 SPRAYS IN EACH  NOSTRIL DAILY  . gabapentin (NEURONTIN) 100 MG capsule TAKE 2 CAPSULES BY MOUTH AT BEDTIME  . irbesartan (AVAPRO) 75 MG tablet Take 1 tablet (75 mg total) by mouth daily.  . methocarbamol (ROBAXIN) 500 MG tablet Take 1 tablet (500 mg total) by mouth at bedtime as needed for muscle spasms.  . Misc Natural Products (TART CHERRY ADVANCED) CAPS Take 1 capsule by mouth 2 (two) times daily. Reported on 03/04/2016  . Multiple Vitamin (MULTIVITAMIN) tablet Take 1 tablet by mouth daily.  . RABEprazole (ACIPHEX) 20 MG tablet Take 1 tablet (20 mg total) by mouth 2 (two) times daily.  . rosuvastatin (CRESTOR) 5 MG tablet TAKE 1 TABLET BY MOUTH  DAILY  . sucralfate (CARAFATE) 1 g tablet Take 1 tablet (1 g total) by mouth 2 (two) times daily.  Marland Kitchen triamterene-hydrochlorothiazide (DYAZIDE) 37.5-25 MG capsule Take 1 each (1 capsule total) by mouth daily.  . [DISCONTINUED] Zoledronic Acid (ZOMETA IV) Inject into the vein. Every 6 months   No facility-administered encounter medications on file as of 10/30/2018.     Review of Systems  Constitutional: Negative for appetite change and unexpected weight change.  HENT: Negative for congestion and sinus pressure.   Eyes: Negative for pain and visual disturbance.  Respiratory: Negative for cough.        Reports some sob with exertion.    Cardiovascular: Negative for palpitations and leg swelling.       Intermittent chest discomfort as outlined.    Gastrointestinal: Negative for abdominal pain, diarrhea, nausea and vomiting.  Genitourinary: Negative for difficulty urinating and dysuria.  Musculoskeletal: Negative for joint swelling and myalgias.  Skin: Negative for color change and rash.  Neurological: Negative for dizziness, light-headedness and headaches.  Hematological: Negative for adenopathy. Does not bruise/bleed easily.  Psychiatric/Behavioral: Negative for agitation and dysphoric mood.       Objective:    Physical Exam    Constitutional: She is oriented to person, place, and time. She appears well-developed and well-nourished. No distress.  HENT:  Nose: Nose normal.  Mouth/Throat: Oropharynx is clear and moist.  Eyes: Right eye exhibits no discharge. Left eye exhibits no discharge. No scleral icterus.  Neck: Neck supple. No thyromegaly present.  Cardiovascular: Normal rate and regular rhythm.  Pulmonary/Chest: Breath sounds normal. No accessory muscle usage. No tachypnea. No respiratory distress. She has no decreased breath sounds. She has no wheezes. She has no rhonchi. Right breast exhibits no inverted nipple, no mass, no nipple discharge  and no tenderness (no axillary adenopathy). Left breast exhibits no inverted nipple, no mass, no nipple discharge and no tenderness (no axilarry adenopathy).  Abdominal: Soft. Bowel sounds are normal. There is no tenderness.  Musculoskeletal: She exhibits no edema or tenderness.  Lymphadenopathy:    She has no cervical adenopathy.  Neurological: She is alert and oriented to person, place, and time.  Skin: No rash noted. No erythema.  Psychiatric: She has a normal mood and affect. Her behavior is normal.    BP 138/86 (BP Location: Left Arm, Patient Position: Sitting, Cuff Size: Normal)   Pulse 85   Temp 98.3 F (36.8 C) (Oral)   Resp 18   Wt 147 lb (66.7 kg)   SpO2 98%   BMI 29.69 kg/m  Wt Readings from Last 3 Encounters:  10/30/18 147 lb (66.7 kg)  06/27/18 147 lb 6 oz (66.8 kg)  05/03/18 149 lb (67.6 kg)     Lab Results  Component Value Date   WBC 5.0 06/27/2018   HGB 13.7 06/27/2018   HCT 40.9 06/27/2018   PLT 236.0 06/27/2018   GLUCOSE 101 (H) 10/27/2018   CHOL 169 10/27/2018   TRIG 112.0 10/27/2018   HDL 62.90 10/27/2018   LDLDIRECT 146.9 01/31/2014   LDLCALC 84 10/27/2018   ALT 16 10/27/2018   AST 18 10/27/2018   NA 140 10/27/2018   K 3.6 10/27/2018   CL 104 10/27/2018   CREATININE 0.94 10/27/2018   BUN 23 10/27/2018   CO2 29 10/27/2018    TSH 2.27 10/27/2018   HGBA1C 6.1 10/27/2018    Dg Ugi W/high Density W/o Kub  Result Date: 10/11/2018 CLINICAL DATA:  Abdominal pain. EXAM: UPPER GI SERIES WITHOUT KUB TECHNIQUE: Routine upper GI series was performed with thin density barium. FLUOROSCOPY TIME:  Fluoroscopy Time:  2 minutes 6 seconds Radiation Exposure Index (if provided by the fluoroscopic device): 42.8 mGy COMPARISON:  CT 01/15/2015. FINDINGS: Prominent tertiary esophageal contractions are noted. This is most consistent with presbyesophagus. Esophagus is widely patent. Prominent sliding hiatal hernia is noted. Mild reflux. Stomach and duodenum are normal. No evidence of peptic ulcer disease. IMPRESSION: 1. Prominent tertiary esophageal contractions most consistent presbyesophagus. Esophagus is widely patent. 2.  Prominent sliding hiatal hernia.  Mild reflux. 3.  Stomach and duodenum are normal. Electronically Signed   By: Marcello Moores  Register   On: 10/11/2018 10:08       Assessment & Plan:   Problem List Items Addressed This Visit    Breast cancer ([place])    On arimidex.  Followed by oncology.  Last check - stable.  Recommended  F/u in one year - 05/2019.        Chest pain    Saw cardiology recently.  Planning for stress test 11/201/19.  Seeing GI.  On PPI and carafate. Some improvement in symptoms since starting IBGARD.  Had questions about her hiatal hernia.  Will check cxr.  Had EGD previously.        Relevant Orders   DG Chest 2 View (Completed)   GERD (gastroesophageal reflux disease)    Followed by GI.  Appears to be controlled on carafate and aciphex.        Health care maintenance    Physical today 10/30/18.  S/p hysterectomy.  Mammogram 01/18/18- Birads II.  Recent cologuard test - negative.        Hypercholesterolemia    On crestor.  Low cholesterol diet and exercise.  Follow lipid panel and liver function tests.  Relevant Medications   irbesartan (AVAPRO) 75 MG tablet   Hyperglycemia    Low carb diet  and exercise.  Follow met b and a1c.        Hypertension    Blood pressure on recheck elevated.  Her outside checks elevated.  Start avapro.  Follow pressures.  Check metabolic panel in 29-79 days.  Follow pressures.          Relevant Medications   irbesartan (AVAPRO) 75 MG tablet   Other Relevant Orders   Basic metabolic panel   Obstructive sleep apnea    Uses cpap regularly.        Osteopenia    Continue weight bearing exercise, calcium and vitamin D.  Follow.        Paraesophageal hiatal hernia    Could be contributing to her sob and symptoms.  GI just evaluated.  Planning for stress test.  Check cxr.        SOB (shortness of breath)    Check cxr. Scheduled for stress echo - 11/08/18.  Continue PPI and carafate.         Other Visit Diagnoses    Routine general medical examination at a health care facility    -  Primary       Einar Pheasant, MD

## 2018-10-30 NOTE — Telephone Encounter (Signed)
Reviewed with pt at her appt.  See note.  Started on benicar.

## 2018-10-30 NOTE — Assessment & Plan Note (Addendum)
Physical today 10/30/18.  S/p hysterectomy.  Mammogram 01/18/18- Birads II.  Recent cologuard test - negative.

## 2018-11-04 ENCOUNTER — Encounter: Payer: Self-pay | Admitting: Internal Medicine

## 2018-11-04 DIAGNOSIS — M8580 Other specified disorders of bone density and structure, unspecified site: Secondary | ICD-10-CM | POA: Insufficient documentation

## 2018-11-04 NOTE — Assessment & Plan Note (Signed)
Check cxr. Scheduled for stress echo - 11/08/18.  Continue PPI and carafate.

## 2018-11-04 NOTE — Assessment & Plan Note (Signed)
Saw cardiology recently.  Planning for stress test 11/201/19.  Seeing GI.  On PPI and carafate. Some improvement in symptoms since starting IBGARD.  Had questions about her hiatal hernia.  Will check cxr.  Had EGD previously.

## 2018-11-04 NOTE — Assessment & Plan Note (Signed)
Could be contributing to her sob and symptoms.  GI just evaluated.  Planning for stress test.  Check cxr.

## 2018-11-04 NOTE — Assessment & Plan Note (Signed)
Low carb diet and exercise.  Follow met b and a1c.   

## 2018-11-04 NOTE — Assessment & Plan Note (Signed)
Followed by GI.  Appears to be controlled on carafate and aciphex.

## 2018-11-04 NOTE — Assessment & Plan Note (Signed)
On arimidex.  Followed by oncology.  Last check - stable.  Recommended  F/u in one year - 05/2019.

## 2018-11-04 NOTE — Assessment & Plan Note (Signed)
On crestor.  Low cholesterol diet and exercise.  Follow lipid panel and liver function tests.   

## 2018-11-04 NOTE — Assessment & Plan Note (Signed)
Continue weight bearing exercise, calcium and vitamin D.  Follow.

## 2018-11-04 NOTE — Assessment & Plan Note (Signed)
Uses cpap regularly.   

## 2018-11-04 NOTE — Assessment & Plan Note (Addendum)
Blood pressure on recheck elevated.  Her outside checks elevated.  Start avapro.  Follow pressures.  Check metabolic panel in 91-63 days.  Follow pressures.

## 2018-11-10 ENCOUNTER — Other Ambulatory Visit (INDEPENDENT_AMBULATORY_CARE_PROVIDER_SITE_OTHER): Payer: Medicare Other

## 2018-11-10 DIAGNOSIS — I10 Essential (primary) hypertension: Secondary | ICD-10-CM

## 2018-11-10 LAB — BASIC METABOLIC PANEL
BUN: 20 mg/dL (ref 6–23)
CALCIUM: 9.5 mg/dL (ref 8.4–10.5)
CHLORIDE: 105 meq/L (ref 96–112)
CO2: 28 meq/L (ref 19–32)
CREATININE: 0.95 mg/dL (ref 0.40–1.20)
GFR: 60.99 mL/min (ref >60.00)
GLUCOSE: 104 mg/dL — AB (ref 70–99)
POTASSIUM: 3.8 meq/L (ref 3.5–5.1)
Sodium: 140 mEq/L (ref 135–145)

## 2018-11-14 ENCOUNTER — Encounter: Payer: Self-pay | Admitting: Internal Medicine

## 2018-11-20 MED ORDER — IRBESARTAN 75 MG PO TABS: 75.0000 mg | ORAL_TABLET | Freq: Every day | ORAL | 1 refills | Status: DC

## 2018-11-20 NOTE — Telephone Encounter (Signed)
rx sent to [facility] for avapro 75mg  #90 with one refill

## 2018-11-23 ENCOUNTER — Encounter: Payer: Self-pay | Admitting: Internal Medicine

## 2018-11-23 ENCOUNTER — Other Ambulatory Visit: Payer: Self-pay | Admitting: Internal Medicine

## 2018-11-23 DIAGNOSIS — R0602 Shortness of breath: Secondary | ICD-10-CM

## 2018-11-23 DIAGNOSIS — K449 Diaphragmatic hernia without obstruction or gangrene: Secondary | ICD-10-CM

## 2018-11-23 DIAGNOSIS — R079 Chest pain, unspecified: Secondary | ICD-10-CM

## 2018-11-23 NOTE — Progress Notes (Signed)
Order placed for GI referral.   

## 2018-11-28 ENCOUNTER — Other Ambulatory Visit: Payer: Self-pay | Admitting: Internal Medicine

## 2018-12-04 ENCOUNTER — Other Ambulatory Visit: Payer: Self-pay | Admitting: Internal Medicine

## 2018-12-24 ENCOUNTER — Other Ambulatory Visit: Payer: Self-pay | Admitting: Internal Medicine

## 2018-12-29 ENCOUNTER — Encounter: Payer: Self-pay | Admitting: Internal Medicine

## 2018-12-29 ENCOUNTER — Ambulatory Visit: Payer: Medicare Other | Admitting: Internal Medicine

## 2018-12-29 DIAGNOSIS — E7800 Pure hypercholesterolemia, unspecified: Secondary | ICD-10-CM | POA: Diagnosis not present

## 2018-12-29 DIAGNOSIS — J309 Allergic rhinitis, unspecified: Secondary | ICD-10-CM | POA: Diagnosis not present

## 2018-12-29 DIAGNOSIS — K449 Diaphragmatic hernia without obstruction or gangrene: Secondary | ICD-10-CM

## 2018-12-29 DIAGNOSIS — R739 Hyperglycemia, unspecified: Secondary | ICD-10-CM

## 2018-12-29 DIAGNOSIS — G4733 Obstructive sleep apnea (adult) (pediatric): Secondary | ICD-10-CM

## 2018-12-29 DIAGNOSIS — K219 Gastro-esophageal reflux disease without esophagitis: Secondary | ICD-10-CM | POA: Diagnosis not present

## 2018-12-29 DIAGNOSIS — I10 Essential (primary) hypertension: Secondary | ICD-10-CM

## 2018-12-29 DIAGNOSIS — C50919 Malignant neoplasm of unspecified site of unspecified female breast: Secondary | ICD-10-CM

## 2018-12-29 NOTE — Progress Notes (Signed)
Patient ID: Carla Ryan, female   DOB: 13-Feb-1946, 73 y.o.   MRN: [id number]   Subjective:    Patient ID: Carla Ryan, female    DOB: [birth year], 73 y.o.   MRN: [id number]  HPI  Patient here for a scheduled follow up.  She reports she is doing relatively well.  Has been seeing GI.  Just evaluated yesterday.  Planning for EGD 02/2019.  On avapro.  Tolerating.  Blood pressure doing better.  Tries to stay active.  No chest pain.  No sob.  No acid reflux.  No abdominal pain.  Bowels moving.  Saw cardiology 10/2018.  Stable.     Past Medical History:  Diagnosis Date  . Abdominal hernia   . Anxiety    situational stress and anxiety  . Cancer ([place]) 2015   breast   . GERD (gastroesophageal reflux disease)   . Hiatal hernia   . Hypercholesterolemia   . Hypertension   . Osteoarthritis    scoliosis, degenerative disc dz, knee, carpal tunnel   Past Surgical History:  Procedure Laterality Date  . ABDOMINAL HYSTERECTOMY  07/2000  . APPENDECTOMY  2000  . BREAST BIOPSY Bilateral 2014  . BREAST SURGERY Right 2015   lumpectomy  . CARPAL TUNNEL RELEASE    . COLONOSCOPY  2011   [name], [name]: Normal exam  . HERNIA REPAIR  02/10/15  . Laparoscopic Repair of Lateral Abdominal Wall Hernia  02/10/15  . UPPER GI ENDOSCOPY  05/10/2014   [name], [name] Bile gastritis, medium hiatal hernia. Incomplete visualization of the cardia and fundus.   Family History  Problem Relation Age of Onset  . Diabetes Mother   . Multiple myeloma Mother   . Diabetes Brother   . Diabetes Sister        x2  . Hypertension Brother   . Multiple myeloma Father    Social History   Socioeconomic History  . Marital status: Married    Spouse name: Not on file  . Number of children: 2  . Years of education: Not on file  . Highest education level: Not on file  Occupational History  . Not on file  Social Needs  . Financial resource strain: Not hard at all  . Food insecurity:    Worry: Never true      Inability: Never true  . Transportation needs:    Medical: No    Non-medical: No  Tobacco Use  . Smoking status: Never Smoker  . Smokeless tobacco: Never Used  Substance and Sexual Activity  . Alcohol use: Yes    Alcohol/week: 0.0 standard drinks    Comment: wine occasionally  . Drug use: No  . Sexual activity: Yes  Lifestyle  . Physical activity:    Days per week: Not on file    Minutes per session: Not on file  . Stress: Not at all  Relationships  . Social connections:    Talks on phone: Not on file    Gets together: Not on file    Attends religious service: Not on file    Active member of club or organization: Not on file    Attends meetings of clubs or organizations: Not on file    Relationship status: Not on file  Other Topics Concern  . Not on file  Social History Narrative  . Not on file    Outpatient Encounter Medications as of 12/29/2018  Medication Sig  . anastrozole (ARIMIDEX) 1 MG tablet TAKE 1 TABLET BY MOUTH  ONCE DAILY  . aspirin 81 MG tablet Take 81 mg by mouth daily.  Marland Kitchen azelastine (ASTELIN) 0.1 % nasal spray USE 2 SPRAYS IN EACH  NOSTRIL TWO TIMES DAILY AS  DIRECTED  . Calcium Carbonate-Vitamin D (CALCIUM 600 + D PO) Take 600 mg by mouth 2 (two) times daily.  . cholecalciferol (VITAMIN D) 1000 UNITS tablet Take 2,000 Units by mouth daily.   Marland Kitchen CINNAMON PO Take by mouth 2 (two) times daily.  . fexofenadine-pseudoephedrine (ALLEGRA-D 24) 180-240 MG 24 hr tablet Take 1 tablet by mouth daily.  . fluticasone (FLONASE) 50 MCG/ACT nasal spray USE 2 SPRAYS IN EACH  NOSTRIL DAILY  . gabapentin (NEURONTIN) 100 MG capsule TAKE 2 CAPSULES BY MOUTH AT BEDTIME  . irbesartan (AVAPRO) 75 MG tablet Take 1 tablet (75 mg total) by mouth daily.  . methocarbamol (ROBAXIN) 500 MG tablet Take 1 tablet (500 mg total) by mouth at bedtime as needed for muscle spasms.  . Misc Natural Products (TART CHERRY ADVANCED) CAPS Take 1 capsule by mouth 2 (two) times daily. Reported on  03/04/2016  . Multiple Vitamin (MULTIVITAMIN) tablet Take 1 tablet by mouth daily.  . RABEprazole (ACIPHEX) 20 MG tablet Take 1 tablet (20 mg total) by mouth 2 (two) times daily.  . rosuvastatin (CRESTOR) 5 MG tablet TAKE 1 TABLET BY MOUTH  DAILY  . sucralfate (CARAFATE) 1 g tablet Take 1 tablet (1 g total) by mouth 2 (two) times daily.  Marland Kitchen triamterene-hydrochlorothiazide (MAXZIDE-25) 37.5-25 MG tablet TAKE 1 TABLET BY MOUTH ONCE DAILY  . [DISCONTINUED] triamterene-hydrochlorothiazide (DYAZIDE) 37.5-25 MG capsule Take 1 each (1 capsule total) by mouth daily.   No facility-administered encounter medications on file as of 12/29/2018.     Review of Systems  Constitutional: Negative for appetite change and unexpected weight change.  HENT: Negative for congestion and sinus pressure.   Respiratory: Negative for cough, chest tightness and shortness of breath.   Cardiovascular: Negative for chest pain, palpitations and leg swelling.  Gastrointestinal: Negative for abdominal pain, diarrhea, nausea and vomiting.       Has had issues with reflux.    Genitourinary: Negative for difficulty urinating and dysuria.  Musculoskeletal: Negative for joint swelling and myalgias.  Skin: Negative for color change and rash.  Neurological: Negative for dizziness, light-headedness and headaches.  Psychiatric/Behavioral: Negative for agitation and dysphoric mood.       Objective:     Blood pressure rechecked by me:  128/78  Physical Exam Constitutional:      General: She is not in acute distress.    Appearance: Normal appearance.  HENT:     Nose: Nose normal. No congestion.     Mouth/Throat:     Pharynx: No oropharyngeal exudate or posterior oropharyngeal erythema.  Neck:     Musculoskeletal: Neck supple. No muscular tenderness.     Thyroid: No thyromegaly.  Cardiovascular:     Rate and Rhythm: Normal rate and regular rhythm.  Pulmonary:     Effort: No respiratory distress.     Breath sounds: Normal  breath sounds. No wheezing.  Abdominal:     General: Bowel sounds are normal.     Palpations: Abdomen is soft.     Tenderness: There is no abdominal tenderness.  Musculoskeletal:        General: No swelling or tenderness.  Lymphadenopathy:     Cervical: No cervical adenopathy.  Skin:    Findings: No erythema or rash.  Neurological:     Mental Status: She is alert.  Psychiatric:        Mood and Affect: Mood normal.        Behavior: Behavior normal.     BP 102/64 (BP Location: Left Arm, Patient Position: Sitting, Cuff Size: Normal)   Pulse 76   Temp 97.9 F (36.6 C) (Oral)   Resp 16   Wt 143 lb 3.2 oz (65 kg)   SpO2 97%   BMI 28.92 kg/m  Wt Readings from Last 3 Encounters:  12/29/18 143 lb 3.2 oz (65 kg)  10/30/18 147 lb (66.7 kg)  06/27/18 147 lb 6 oz (66.8 kg)     Lab Results  Component Value Date   WBC 5.0 06/27/2018   HGB 13.7 06/27/2018   HCT 40.9 06/27/2018   PLT 236.0 06/27/2018   GLUCOSE 104 (H) 11/10/2018   CHOL 169 10/27/2018   TRIG 112.0 10/27/2018   HDL 62.90 10/27/2018   LDLDIRECT 146.9 01/31/2014   LDLCALC 84 10/27/2018   ALT 16 10/27/2018   AST 18 10/27/2018   NA 140 11/10/2018   K 3.8 11/10/2018   CL 105 11/10/2018   CREATININE 0.95 11/10/2018   BUN 20 11/10/2018   CO2 28 11/10/2018   TSH 2.27 10/27/2018   HGBA1C 6.1 10/27/2018    Dg Ugi W/high Density W/o Kub  Result Date: 10/11/2018 CLINICAL DATA:  Abdominal pain. EXAM: UPPER GI SERIES WITHOUT KUB TECHNIQUE: Routine upper GI series was performed with thin density barium. FLUOROSCOPY TIME:  Fluoroscopy Time:  2 minutes 6 seconds Radiation Exposure Index (if provided by the fluoroscopic device): 42.8 mGy COMPARISON:  CT 01/15/2015. FINDINGS: Prominent tertiary esophageal contractions are noted. This is most consistent with presbyesophagus. Esophagus is widely patent. Prominent sliding hiatal hernia is noted. Mild reflux. Stomach and duodenum are normal. No evidence of peptic ulcer disease.  IMPRESSION: 1. Prominent tertiary esophageal contractions most consistent presbyesophagus. Esophagus is widely patent. 2.  Prominent sliding hiatal hernia.  Mild reflux. 3.  Stomach and duodenum are normal. Electronically Signed   By: Marcello Moores  Register   On: 10/11/2018 10:08       Assessment & Plan:   Problem List Items Addressed This Visit    Allergic rhinitis    Controlled on current regimen.        Breast cancer ([place])    On arimidex.  Followed by oncology.  Last check stable.  Follow up recommended in one year - 05/2019.        GERD (gastroesophageal reflux disease)    Followed by GI.  Appears to be controlled on carafate and aciphex.        Hypercholesterolemia    On crestor.  Low cholesterol diet and exercise.  Follow lipid panel and liver function tests.        Relevant Orders   Hepatic function panel   Lipid panel   Hyperglycemia    Low carb diet and exercise.  Follow met b and a1c.        Relevant Orders   Hemoglobin A1c   Hypertension    Blood pressure under good control.  Continue same medication regimen.  Doing well on avapro.  Follow pressures.  Follow metabolic panel.        Relevant Orders   Basic metabolic panel   Obstructive sleep apnea    Uses cpap regularly.        Paraesophageal hiatal hernia    Saw GI.  Planning for EGD in 02/2019.            Cristine Daw  Nicki Reaper, MD

## 2018-12-31 ENCOUNTER — Encounter: Payer: Self-pay | Admitting: Internal Medicine

## 2018-12-31 NOTE — Assessment & Plan Note (Signed)
Controlled on current regimen.   

## 2018-12-31 NOTE — Assessment & Plan Note (Signed)
On crestor.  Low cholesterol diet and exercise.  Follow lipid panel and liver function tests.   

## 2018-12-31 NOTE — Assessment & Plan Note (Signed)
Uses cpap regularly.   

## 2018-12-31 NOTE — Assessment & Plan Note (Addendum)
Blood pressure under good control.  Continue same medication regimen.  Doing well on avapro.  Follow pressures.  Follow metabolic panel.

## 2018-12-31 NOTE — Assessment & Plan Note (Signed)
On arimidex.  Followed by oncology.  Last check stable.  Follow up recommended in one year - 05/2019.

## 2018-12-31 NOTE — Assessment & Plan Note (Signed)
Low carb diet and exercise.  Follow met b and a1c.   

## 2018-12-31 NOTE — Assessment & Plan Note (Signed)
Saw GI.  Planning for EGD in 02/2019.

## 2018-12-31 NOTE — Assessment & Plan Note (Signed)
Followed by GI.  Appears to be controlled on carafate and aciphex.

## 2019-02-27 ENCOUNTER — Ambulatory Visit: Admission: RE | Admit: 2019-02-27 | Payer: Medicare Other | Source: Home / Self Care | Admitting: Gastroenterology

## 2019-02-27 ENCOUNTER — Encounter: Admission: RE | Payer: Self-pay | Source: Home / Self Care

## 2019-02-27 SURGERY — EGD (ESOPHAGOGASTRODUODENOSCOPY)
Anesthesia: General

## 2019-03-01 ENCOUNTER — Other Ambulatory Visit: Payer: Self-pay | Admitting: Internal Medicine

## 2019-04-02 ENCOUNTER — Other Ambulatory Visit: Payer: Medicare Other

## 2019-04-02 ENCOUNTER — Other Ambulatory Visit: Payer: Self-pay | Admitting: Internal Medicine

## 2019-04-04 ENCOUNTER — Ambulatory Visit: Payer: Medicare Other | Admitting: Internal Medicine

## 2019-05-18 ENCOUNTER — Other Ambulatory Visit: Payer: Medicare Other

## 2019-05-21 ENCOUNTER — Ambulatory Visit: Payer: Medicare Other | Admitting: Internal Medicine

## 2019-06-02 ENCOUNTER — Other Ambulatory Visit: Payer: Self-pay | Admitting: Internal Medicine

## 2019-06-28 ENCOUNTER — Other Ambulatory Visit: Payer: Self-pay | Admitting: Internal Medicine

## 2019-07-03 ENCOUNTER — Encounter: Payer: Self-pay | Admitting: Internal Medicine

## 2019-07-04 ENCOUNTER — Ambulatory Visit: Payer: Medicare Other | Admitting: Internal Medicine

## 2019-07-04 ENCOUNTER — Ambulatory Visit: Payer: Medicare Other

## 2019-07-05 NOTE — Telephone Encounter (Signed)
Pt scheduled  

## 2019-07-09 ENCOUNTER — Other Ambulatory Visit (INDEPENDENT_AMBULATORY_CARE_PROVIDER_SITE_OTHER): Payer: Medicare Other

## 2019-07-09 ENCOUNTER — Other Ambulatory Visit: Payer: Self-pay

## 2019-07-09 ENCOUNTER — Other Ambulatory Visit: Payer: Self-pay | Admitting: Internal Medicine

## 2019-07-09 DIAGNOSIS — R739 Hyperglycemia, unspecified: Secondary | ICD-10-CM

## 2019-07-09 DIAGNOSIS — E7800 Pure hypercholesterolemia, unspecified: Secondary | ICD-10-CM | POA: Diagnosis not present

## 2019-07-09 DIAGNOSIS — I10 Essential (primary) hypertension: Secondary | ICD-10-CM | POA: Diagnosis not present

## 2019-07-09 LAB — HEPATIC FUNCTION PANEL
ALT: 17 U/L (ref 0–35)
AST: 19 U/L (ref 0–37)
Albumin: 4.2 g/dL (ref 3.5–5.2)
Alkaline Phosphatase: 45 U/L (ref 39–117)
Bilirubin, Direct: 0.1 mg/dL (ref 0.0–0.3)
Total Bilirubin: 0.5 mg/dL (ref 0.2–1.2)
Total Protein: 6.6 g/dL (ref 6.0–8.3)

## 2019-07-09 LAB — BASIC METABOLIC PANEL
BUN: 22 mg/dL (ref 6–23)
CO2: 28 mEq/L (ref 19–32)
Calcium: 9.4 mg/dL (ref 8.4–10.5)
Chloride: 102 mEq/L (ref 96–112)
Creatinine, Ser: 0.93 mg/dL (ref 0.40–1.20)
GFR: 58.71 mL/min — ABNORMAL LOW (ref >60.00)
Glucose, Bld: 97 mg/dL (ref 70–99)
Potassium: 3.9 mEq/L (ref 3.5–5.1)
Sodium: 138 mEq/L (ref 135–145)

## 2019-07-09 LAB — LIPID PANEL
Cholesterol: 193 mg/dL (ref 0–200)
HDL: 64.2 mg/dL (ref >39.00)
LDL Cholesterol: 105 mg/dL — ABNORMAL HIGH (ref 0–99)
NonHDL: 128.97
Total CHOL/HDL Ratio: 3
Triglycerides: 118 mg/dL (ref 0.0–149.0)
VLDL: 23.6 mg/dL (ref 0.0–40.0)

## 2019-07-09 LAB — HEMOGLOBIN A1C: Hgb A1c MFr Bld: 6.1 % (ref 4.6–6.5)

## 2019-07-10 ENCOUNTER — Ambulatory Visit: Payer: Medicare Other

## 2019-07-10 ENCOUNTER — Ambulatory Visit (INDEPENDENT_AMBULATORY_CARE_PROVIDER_SITE_OTHER): Payer: Medicare Other | Admitting: Internal Medicine

## 2019-07-10 ENCOUNTER — Encounter: Payer: Self-pay | Admitting: Internal Medicine

## 2019-07-10 DIAGNOSIS — E7800 Pure hypercholesterolemia, unspecified: Secondary | ICD-10-CM | POA: Diagnosis not present

## 2019-07-10 DIAGNOSIS — K219 Gastro-esophageal reflux disease without esophagitis: Secondary | ICD-10-CM

## 2019-07-10 DIAGNOSIS — R739 Hyperglycemia, unspecified: Secondary | ICD-10-CM

## 2019-07-10 DIAGNOSIS — C50919 Malignant neoplasm of unspecified site of unspecified female breast: Secondary | ICD-10-CM | POA: Diagnosis not present

## 2019-07-10 DIAGNOSIS — I10 Essential (primary) hypertension: Secondary | ICD-10-CM

## 2019-07-10 DIAGNOSIS — K449 Diaphragmatic hernia without obstruction or gangrene: Secondary | ICD-10-CM

## 2019-07-10 DIAGNOSIS — G4733 Obstructive sleep apnea (adult) (pediatric): Secondary | ICD-10-CM

## 2019-07-10 NOTE — Progress Notes (Signed)
Patient ID: Carla Ryan, female   DOB: [birth year], 73 y.o.   MRN: [id number]   Virtual Visit via telephone Note  This visit type was conducted due to national recommendations for restrictions regarding the COVID-19 pandemic (e.g. social distancing).  This format is felt to be most appropriate for this patient at this time.  All issues noted in this document were discussed and addressed.  No physical exam was performed (except for noted visual exam findings with Video Visits).   I connected with [name] today by telephone and verified that I am speaking with the correct person using two identifiers. Location patient: home Location provider: work  Persons participating in the telephone visit: patient, provider  I discussed the limitations, risks, security and privacy concerns of performing an evaluation and management service by telephone and the availability of in person appointments.  The patient expressed understanding and agreed to proceed.   Reason for visit: scheduled follow up.    HPI: She reports she is doing relatively well.  Trying to stay active.  Saw GI 06/26/19 for f/u GERD/hiatal hernia.  Taking PPI and carafate.  Doing well.  States she will notice when her stomach is full and she climbs stairs, will get a little more winded.  No chest pain.  No sob with stairs, if does not have a full stomach.  No sob with increased activity or exertion.  No abdominal pain.  Bowels moving.  On arimidex.  Followed by oncology.  Has appt 07/2019 for f/u.  Last mammogram 01/24/19.  Discussed recent lab results.  Continue crestor.  Using cpap.  Blood pressures doing well. (averaging 118-127/70s).  Taking allegra for allergies.  Handling stress.  Has good support. Staying in due to covid restrictions.     ROS: See pertinent positives and negatives per HPI.  Past Medical History:  Diagnosis Date  . Abdominal hernia   . Anxiety    situational stress and anxiety  . Cancer ([place]) 2015   breast    . GERD (gastroesophageal reflux disease)   . Hiatal hernia   . Hypercholesterolemia   . Hypertension   . Osteoarthritis    scoliosis, degenerative disc dz, knee, carpal tunnel    Past Surgical History:  Procedure Laterality Date  . ABDOMINAL HYSTERECTOMY  07/2000  . APPENDECTOMY  2000  . BREAST BIOPSY Bilateral 2014  . BREAST SURGERY Right 2015   lumpectomy  . CARPAL TUNNEL RELEASE    . COLONOSCOPY  2011   [name], [name]: Normal exam  . HERNIA REPAIR  02/10/15  . Laparoscopic Repair of Lateral Abdominal Wall Hernia  02/10/15  . UPPER GI ENDOSCOPY  05/10/2014   [name], [name] Bile gastritis, medium hiatal hernia. Incomplete visualization of the cardia and fundus.    Family History  Problem Relation Age of Onset  . Diabetes Mother   . Multiple myeloma Mother   . Diabetes Brother   . Diabetes Sister        x2  . Hypertension Brother   . Multiple myeloma Father     SOCIAL HX: reviewed.    Current Outpatient Medications:  .  PEPPERMINT OIL PO, Take by mouth., Disp: , Rfl:  .  anastrozole (ARIMIDEX) 1 MG tablet, TAKE 1 TABLET BY MOUTH ONCE DAILY, Disp: , Rfl:  .  aspirin 81 MG tablet, Take 81 mg by mouth daily., Disp: , Rfl:  .  azelastine (ASTELIN) 0.1 % nasal spray, USE 2 SPRAYS IN EACH  NOSTRIL TWO TIMES DAILY  AS  DIRECTED, Disp: 120 mL, Rfl: 1 .  Calcium Carbonate-Vitamin D (CALCIUM 600 + D PO), Take 600 mg by mouth 2 (two) times daily., Disp: , Rfl:  .  cholecalciferol (VITAMIN D) 1000 UNITS tablet, Take 2,000 Units by mouth daily. , Disp: , Rfl:  .  CINNAMON PO, Take by mouth 2 (two) times daily., Disp: , Rfl:  .  fexofenadine-pseudoephedrine (ALLEGRA-D 24) 180-240 MG 24 hr tablet, Take 1 tablet by mouth daily., Disp: , Rfl:  .  fluticasone (FLONASE) 50 MCG/ACT nasal spray, USE 2 SPRAYS IN EACH  NOSTRIL DAILY, Disp: 48 g, Rfl: 1 .  gabapentin (NEURONTIN) 100 MG capsule, TAKE 2 CAPSULES BY MOUTH AT BEDTIME, Disp: 180 capsule, Rfl: 1 .  irbesartan (AVAPRO)  75 MG tablet, TAKE 1 TABLET BY MOUTH  DAILY, Disp: 90 tablet, Rfl: 1 .  methocarbamol (ROBAXIN) 500 MG tablet, Take 1 tablet (500 mg total) by mouth at bedtime as needed for muscle spasms., Disp: 30 tablet, Rfl: 0 .  Misc Natural Products (TART CHERRY ADVANCED) CAPS, Take 1 capsule by mouth 2 (two) times daily. Reported on 03/04/2016, Disp: , Rfl:  .  Multiple Vitamin (MULTIVITAMIN) tablet, Take 1 tablet by mouth daily., Disp: , Rfl:  .  RABEprazole (ACIPHEX) 20 MG tablet, Take 1 tablet (20 mg total) by mouth 2 (two) times daily., Disp: 180 tablet, Rfl: 1 .  rosuvastatin (CRESTOR) 5 MG tablet, TAKE 1 TABLET BY MOUTH  DAILY, Disp: 90 tablet, Rfl: 1 .  sucralfate (CARAFATE) 1 g tablet, Take 1 tablet (1 g total) by mouth 2 (two) times daily., Disp: 180 tablet, Rfl: 1 .  triamterene-hydrochlorothiazide (MAXZIDE-25) 37.5-25 MG tablet, Take 1 tablet by mouth once daily, Disp: 90 tablet, Rfl: 0  EXAM:  VITALS per patient if applicable: 176-160/73X.    GENERAL: alert.  Sounds to be in no acute distress.  Answering questions appropriately.    PSYCH/NEURO: pleasant and cooperative, no obvious depression or anxiety, speech and thought processing grossly intact  ASSESSMENT AND PLAN:  Discussed the following assessment and plan:  Breast cancer On arimidex.  Mammogram 01/2019.  Has f/u planned with oncology 07/2019.    GERD (gastroesophageal reflux disease) Followed by GI.  Doing well on current regimen. Follow.    Hypercholesterolemia On crestor.  Low cholesterol diet and exercise.  Follow lipid panel and liver function tests.    Hyperglycemia Low carb diet and exercise.  Follow met b and a1c.   Hypertension Blood pressures as outlined.  Continue current medication regimen.  Follow pressures.  Follow metabolic panel.   Obstructive sleep apnea Continue cpap.    Paraesophageal hiatal hernia Followed by GI.     I discussed the assessment and treatment plan with the patient. The patient was  provided an opportunity to ask questions and all were answered. The patient agreed with the plan and demonstrated an understanding of the instructions.   The patient was advised to call back or seek an in-person evaluation if the symptoms worsen or if the condition fails to improve as anticipated.  I provided 20 minutes of non-face-to-face time during this encounter.   Einar Pheasant, MD

## 2019-07-15 ENCOUNTER — Encounter: Payer: Self-pay | Admitting: Internal Medicine

## 2019-07-15 NOTE — Assessment & Plan Note (Signed)
On crestor.  Low cholesterol diet and exercise.  Follow lipid panel and liver function tests.   

## 2019-07-15 NOTE — Assessment & Plan Note (Signed)
Followed by GI.  Doing well on current regimen. Follow.

## 2019-07-15 NOTE — Assessment & Plan Note (Signed)
Blood pressures as outlined.  Continue current medication regimen.  Follow pressures.  Follow metabolic panel.

## 2019-07-15 NOTE — Assessment & Plan Note (Signed)
Low carb diet and exercise.  Follow met b and a1c.  

## 2019-07-15 NOTE — Assessment & Plan Note (Signed)
On arimidex.  Mammogram 01/2019.  Has f/u planned with oncology 07/2019.

## 2019-07-15 NOTE — Assessment & Plan Note (Signed)
Continue cpap.  

## 2019-07-15 NOTE — Assessment & Plan Note (Signed)
Followed by GI

## 2019-08-24 ENCOUNTER — Other Ambulatory Visit: Payer: Self-pay | Admitting: Internal Medicine

## 2019-08-29 ENCOUNTER — Other Ambulatory Visit: Payer: Self-pay | Admitting: Internal Medicine

## 2019-09-26 ENCOUNTER — Other Ambulatory Visit: Payer: Self-pay | Admitting: Internal Medicine

## 2019-10-17 ENCOUNTER — Ambulatory Visit (INDEPENDENT_AMBULATORY_CARE_PROVIDER_SITE_OTHER): Payer: Medicare Other

## 2019-10-17 ENCOUNTER — Other Ambulatory Visit: Payer: Self-pay

## 2019-10-17 DIAGNOSIS — Z23 Encounter for immunization: Secondary | ICD-10-CM

## 2019-10-26 ENCOUNTER — Telehealth: Payer: Self-pay | Admitting: *Deleted

## 2019-10-26 DIAGNOSIS — I10 Essential (primary) hypertension: Secondary | ICD-10-CM

## 2019-10-26 DIAGNOSIS — E7800 Pure hypercholesterolemia, unspecified: Secondary | ICD-10-CM

## 2019-10-26 DIAGNOSIS — R739 Hyperglycemia, unspecified: Secondary | ICD-10-CM

## 2019-10-26 NOTE — Telephone Encounter (Signed)
Please place future orders for lab appt.  

## 2019-10-26 NOTE — Telephone Encounter (Signed)
Orders placed for future labs.  

## 2019-10-30 ENCOUNTER — Other Ambulatory Visit (INDEPENDENT_AMBULATORY_CARE_PROVIDER_SITE_OTHER): Payer: Medicare Other

## 2019-10-30 ENCOUNTER — Other Ambulatory Visit: Payer: Self-pay

## 2019-10-30 DIAGNOSIS — R739 Hyperglycemia, unspecified: Secondary | ICD-10-CM | POA: Diagnosis not present

## 2019-10-30 DIAGNOSIS — E7800 Pure hypercholesterolemia, unspecified: Secondary | ICD-10-CM | POA: Diagnosis not present

## 2019-10-30 DIAGNOSIS — I10 Essential (primary) hypertension: Secondary | ICD-10-CM

## 2019-10-30 LAB — BASIC METABOLIC PANEL
BUN: 21 mg/dL (ref 6–23)
CO2: 29 mEq/L (ref 19–32)
Calcium: 9.6 mg/dL (ref 8.4–10.5)
Chloride: 102 mEq/L (ref 96–112)
Creatinine, Ser: 0.9 mg/dL (ref 0.40–1.20)
GFR: 60.92 mL/min (ref >60.00)
Glucose, Bld: 104 mg/dL — ABNORMAL HIGH (ref 70–99)
Potassium: 3.8 mEq/L (ref 3.5–5.1)
Sodium: 137 mEq/L (ref 135–145)

## 2019-10-30 LAB — CBC WITH DIFFERENTIAL/PLATELET
Basophils Absolute: 0.1 10*3/uL (ref 0.0–0.1)
Basophils Relative: 1.1 % (ref 0.0–3.0)
Eosinophils Absolute: 0.2 10*3/uL (ref 0.0–0.7)
Eosinophils Relative: 4.6 % (ref 0.0–5.0)
HCT: 37.7 % (ref 36.0–46.0)
Hemoglobin: 12.7 g/dL (ref 12.0–15.0)
Lymphocytes Relative: 28.6 % (ref 12.0–46.0)
Lymphs Abs: 1.4 10*3/uL (ref 0.7–4.0)
MCHC: 33.6 g/dL (ref 30.0–36.0)
MCV: 92.4 fl (ref 78.0–100.0)
Monocytes Absolute: 0.4 10*3/uL (ref 0.1–1.0)
Monocytes Relative: 9.1 % (ref 3.0–12.0)
Neutro Abs: 2.8 10*3/uL (ref 1.4–7.7)
Neutrophils Relative %: 56.6 % (ref 43.0–77.0)
Platelets: 223 10*3/uL (ref 150.0–400.0)
RBC: 4.08 Mil/uL (ref 3.87–5.11)
RDW: 14.4 % (ref 11.5–15.5)
WBC: 4.9 10*3/uL (ref 4.0–10.5)

## 2019-10-30 LAB — LIPID PANEL
Cholesterol: 193 mg/dL (ref 0–200)
HDL: 66 mg/dL (ref >39.00)
LDL Cholesterol: 106 mg/dL — ABNORMAL HIGH (ref 0–99)
NonHDL: 127.49
Total CHOL/HDL Ratio: 3
Triglycerides: 108 mg/dL (ref 0.0–149.0)
VLDL: 21.6 mg/dL (ref 0.0–40.0)

## 2019-10-30 LAB — HEPATIC FUNCTION PANEL
ALT: 15 U/L (ref 0–35)
AST: 15 U/L (ref 0–37)
Albumin: 4.1 g/dL (ref 3.5–5.2)
Alkaline Phosphatase: 48 U/L (ref 39–117)
Bilirubin, Direct: 0.1 mg/dL (ref 0.0–0.3)
Total Bilirubin: 0.5 mg/dL (ref 0.2–1.2)
Total Protein: 6.3 g/dL (ref 6.0–8.3)

## 2019-10-30 LAB — HEMOGLOBIN A1C: Hgb A1c MFr Bld: 6.1 % (ref 4.6–6.5)

## 2019-10-30 LAB — TSH: TSH: 2.05 u[IU]/mL (ref 0.35–4.50)

## 2019-11-01 ENCOUNTER — Other Ambulatory Visit: Payer: Self-pay

## 2019-11-01 ENCOUNTER — Ambulatory Visit (INDEPENDENT_AMBULATORY_CARE_PROVIDER_SITE_OTHER): Payer: Medicare Other | Admitting: Internal Medicine

## 2019-11-01 VITALS — BP 122/62 | HR 73 | Temp 97.7°F | Resp 16 | Ht 60.0 in | Wt 148.0 lb

## 2019-11-01 DIAGNOSIS — G4733 Obstructive sleep apnea (adult) (pediatric): Secondary | ICD-10-CM

## 2019-11-01 DIAGNOSIS — K219 Gastro-esophageal reflux disease without esophagitis: Secondary | ICD-10-CM | POA: Diagnosis not present

## 2019-11-01 DIAGNOSIS — M79606 Pain in leg, unspecified: Secondary | ICD-10-CM

## 2019-11-01 DIAGNOSIS — R739 Hyperglycemia, unspecified: Secondary | ICD-10-CM

## 2019-11-01 DIAGNOSIS — I10 Essential (primary) hypertension: Secondary | ICD-10-CM

## 2019-11-01 DIAGNOSIS — M8580 Other specified disorders of bone density and structure, unspecified site: Secondary | ICD-10-CM

## 2019-11-01 DIAGNOSIS — M545 Low back pain, unspecified: Secondary | ICD-10-CM

## 2019-11-01 DIAGNOSIS — C50919 Malignant neoplasm of unspecified site of unspecified female breast: Secondary | ICD-10-CM

## 2019-11-01 DIAGNOSIS — Z0000 Encounter for general adult medical examination without abnormal findings: Secondary | ICD-10-CM

## 2019-11-01 DIAGNOSIS — E7800 Pure hypercholesterolemia, unspecified: Secondary | ICD-10-CM | POA: Diagnosis not present

## 2019-11-01 MED ORDER — METHOCARBAMOL 500 MG PO TABS: 500.0000 mg | ORAL_TABLET | Freq: Every evening | ORAL | 0 refills | Status: AC | PRN

## 2019-11-01 NOTE — Assessment & Plan Note (Addendum)
Physical today 11/01/19.  Followed by GI.  cologuard 09/29/18 - negative.  Mammogram through [facility].

## 2019-11-01 NOTE — Progress Notes (Addendum)
Patient ID: Carla Ryan, female   DOB: Jan 31, 1946, 73 y.o.   MRN: [id number]   Subjective:    Patient ID: Carla Ryan, female    DOB: 02/06/1946, 73 y.o.   MRN: [id number]  HPI  Patient here for her physical exam.  She reports she is doing relatively well. On armidex.  Followed by oncology - f/u history of breast cancer.  Being followed by oncology for osteopenia - treatment.  Last evaluated by oncology 07/2019.  Recommended f/u in 6 months.  No chest pain.  Chronic back pain.  Saw cardiology 07/2019.  Stable.  Recommended f/u in 6 months.  Discussed labs.  See if she can tolerate increasing crestor to 4 days per week.  Regarding her leg discomfort, she wants to try increasing gabapentin back up to 3 q hs.     Past Medical History:  Diagnosis Date  . Abdominal hernia   . Anxiety    situational stress and anxiety  . Cancer ([place]) 2015   breast   . GERD (gastroesophageal reflux disease)   . Hiatal hernia   . Hypercholesterolemia   . Hypertension   . Osteoarthritis    scoliosis, degenerative disc dz, knee, carpal tunnel   Past Surgical History:  Procedure Laterality Date  . ABDOMINAL HYSTERECTOMY  07/2000  . APPENDECTOMY  2000  . BREAST BIOPSY Bilateral 2014  . BREAST SURGERY Right 2015   lumpectomy  . CARPAL TUNNEL RELEASE    . COLONOSCOPY  2011   [name], [name]: Normal exam  . HERNIA REPAIR  02/10/15  . Laparoscopic Repair of Lateral Abdominal Wall Hernia  02/10/15  . UPPER GI ENDOSCOPY  05/10/2014   [name], [name] Bile gastritis, medium hiatal hernia. Incomplete visualization of the cardia and fundus.   Family History  Problem Relation Age of Onset  . Diabetes Mother   . Multiple myeloma Mother   . Diabetes Brother   . Diabetes Sister        x2  . Hypertension Brother   . Multiple myeloma Father    Social History   Socioeconomic History  . Marital status: Married    Spouse name: Not on file  . Number of children: 2  . Years of education: Not on  file  . Highest education level: Not on file  Occupational History  . Not on file  Tobacco Use  . Smoking status: Never Smoker  . Smokeless tobacco: Never Used  Substance and Sexual Activity  . Alcohol use: Yes    Alcohol/week: 0.0 standard drinks    Comment: wine occasionally  . Drug use: No  . Sexual activity: Yes  Other Topics Concern  . Not on file  Social History Narrative  . Not on file   Social Determinants of Health   Financial Resource Strain:   . Difficulty of Paying Living Expenses: Not on file  Food Insecurity:   . Worried About Charity fundraiser in the Last Year: Not on file  . Ran Out of Food in the Last Year: Not on file  Transportation Needs:   . Lack of Transportation (Medical): Not on file  . Lack of Transportation (Non-Medical): Not on file  Physical Activity:   . Days of Exercise per Week: Not on file  . Minutes of Exercise per Session: Not on file  Stress:   . Feeling of Stress : Not on file  Social Connections:   . Frequency of Communication with Friends and Family: Not on file  .  Frequency of Social Gatherings with Friends and Family: Not on file  . Attends Religious Services: Not on file  . Active Member of Clubs or Organizations: Not on file  . Attends Archivist Meetings: Not on file  . Marital Status: Not on file    Outpatient Encounter Medications as of 11/01/2019  Medication Sig  . anastrozole (ARIMIDEX) 1 MG tablet TAKE 1 TABLET BY MOUTH ONCE DAILY  . aspirin 81 MG tablet Take 81 mg by mouth daily.  . Calcium Carbonate-Vitamin D (CALCIUM 600 + D PO) Take 600 mg by mouth 2 (two) times daily.  . cholecalciferol (VITAMIN D) 1000 UNITS tablet Take 2,000 Units by mouth daily.   Marland Kitchen CINNAMON PO Take by mouth 2 (two) times daily.  . fluticasone (FLONASE) 50 MCG/ACT nasal spray USE 2 SPRAYS IN EACH  NOSTRIL DAILY  . irbesartan (AVAPRO) 75 MG tablet TAKE 1 TABLET BY MOUTH  DAILY  . methocarbamol (ROBAXIN) 500 MG tablet Take 1 tablet  (500 mg total) by mouth at bedtime as needed for muscle spasms.  . Misc Natural Products (TART CHERRY ADVANCED) CAPS Take 1 capsule by mouth 2 (two) times daily. Reported on 03/04/2016  . Multiple Vitamin (MULTIVITAMIN) tablet Take 1 tablet by mouth daily.  Marland Kitchen PEPPERMINT OIL PO Take by mouth.  . RABEprazole (ACIPHEX) 20 MG tablet Take 1 tablet (20 mg total) by mouth 2 (two) times daily.  . rosuvastatin (CRESTOR) 5 MG tablet TAKE 1 TABLET BY MOUTH  DAILY  . sucralfate (CARAFATE) 1 g tablet Take 1 tablet (1 g total) by mouth 2 (two) times daily.  . [DISCONTINUED] azelastine (ASTELIN) 0.1 % nasal spray USE 2 SPRAYS IN EACH  NOSTRIL TWO TIMES DAILY AS  DIRECTED  . [DISCONTINUED] fexofenadine-pseudoephedrine (ALLEGRA-D 24) 180-240 MG 24 hr tablet Take 1 tablet by mouth daily.  . [DISCONTINUED] gabapentin (NEURONTIN) 100 MG capsule TAKE 2 CAPSULES BY MOUTH AT BEDTIME  . [DISCONTINUED] methocarbamol (ROBAXIN) 500 MG tablet Take 1 tablet (500 mg total) by mouth at bedtime as needed for muscle spasms.  . [DISCONTINUED] triamterene-hydrochlorothiazide (MAXZIDE-25) 37.5-25 MG tablet Take 1 tablet by mouth once daily   No facility-administered encounter medications on file as of 11/01/2019.   Review of Systems  Constitutional: Negative for appetite change and unexpected weight change.  HENT: Negative for congestion and sinus pressure.   Eyes: Negative for pain and visual disturbance.  Respiratory: Negative for cough, chest tightness and shortness of breath.   Cardiovascular: Negative for chest pain, palpitations and leg swelling.  Gastrointestinal: Negative for abdominal pain, diarrhea, nausea and vomiting.  Genitourinary: Negative for difficulty urinating and dysuria.  Musculoskeletal: Positive for back pain. Negative for joint swelling and myalgias.       Leg pain.   Skin: Negative for color change and rash.  Neurological: Negative for dizziness, light-headedness and headaches.  Hematological:  Negative for adenopathy. Does not bruise/bleed easily.  Psychiatric/Behavioral: Negative for agitation and dysphoric mood.       Objective:    Physical Exam Constitutional:      General: She is not in acute distress.    Appearance: Normal appearance.  HENT:     Head: Normocephalic and atraumatic.     Right Ear: External ear normal.     Left Ear: External ear normal.  Eyes:     General: No scleral icterus.       Right eye: No discharge.        Left eye: No discharge.  Conjunctiva/sclera: Conjunctivae normal.  Neck:     Musculoskeletal: Neck supple. No muscular tenderness.     Thyroid: No thyromegaly.  Cardiovascular:     Rate and Rhythm: Normal rate and regular rhythm.  Pulmonary:     Effort: No respiratory distress.     Breath sounds: Normal breath sounds. No wheezing.  Abdominal:     General: Bowel sounds are normal.     Palpations: Abdomen is soft.     Tenderness: There is no abdominal tenderness.  Musculoskeletal:        General: No swelling or tenderness.  Lymphadenopathy:     Cervical: No cervical adenopathy.  Skin:    Findings: No erythema or rash.  Neurological:     Mental Status: She is alert.  Psychiatric:        Mood and Affect: Mood normal.        Behavior: Behavior normal.     BP 122/62   Pulse 73   Temp 97.7 F (36.5 C)   Resp 16   Ht 5' (1.524 m)   Wt 148 lb (67.1 kg)   SpO2 98%   BMI 28.90 kg/m  Wt Readings from Last 3 Encounters:  11/01/19 148 lb (67.1 kg)  12/29/18 143 lb 3.2 oz (65 kg)  10/30/18 147 lb (66.7 kg)     Lab Results  Component Value Date   WBC 4.9 10/30/2019   HGB 12.7 10/30/2019   HCT 37.7 10/30/2019   PLT 223.0 10/30/2019   GLUCOSE 104 (H) 10/30/2019   CHOL 193 10/30/2019   TRIG 108.0 10/30/2019   HDL 66.00 10/30/2019   LDLDIRECT 146.9 01/31/2014   LDLCALC 106 (H) 10/30/2019   ALT 15 10/30/2019   AST 15 10/30/2019   NA 137 10/30/2019   K 3.8 10/30/2019   CL 102 10/30/2019   CREATININE 0.90 10/30/2019    BUN 21 10/30/2019   CO2 29 10/30/2019   TSH 2.05 10/30/2019   HGBA1C 6.1 10/30/2019    Dg Ugi W/high Density W/o Kub  Result Date: 10/11/2018 CLINICAL DATA:  Abdominal pain. EXAM: UPPER GI SERIES WITHOUT KUB TECHNIQUE: Routine upper GI series was performed with thin density barium. FLUOROSCOPY TIME:  Fluoroscopy Time:  2 minutes 6 seconds Radiation Exposure Index (if provided by the fluoroscopic device): 42.8 mGy COMPARISON:  CT 01/15/2015. FINDINGS: Prominent tertiary esophageal contractions are noted. This is most consistent with presbyesophagus. Esophagus is widely patent. Prominent sliding hiatal hernia is noted. Mild reflux. Stomach and duodenum are normal. No evidence of peptic ulcer disease. IMPRESSION: 1. Prominent tertiary esophageal contractions most consistent presbyesophagus. Esophagus is widely patent. 2.  Prominent sliding hiatal hernia.  Mild reflux. 3.  Stomach and duodenum are normal. Electronically Signed   By: Marcello Moores  Register   On: 10/11/2018 10:08       Assessment & Plan:   Problem List Items Addressed This Visit    Back pain    Chronic.        Relevant Medications   methocarbamol (ROBAXIN) 500 MG tablet   Breast cancer ([place])    On arimidex.  Followed by oncology.        GERD (gastroesophageal reflux disease)    Has been followed by GI.  On aciphex.        Health care maintenance    Physical today 11/01/19.  Followed by GI.  cologuard 09/29/18 - negative.  Mammogram through [facility].       Hypercholesterolemia    On crestor.  Low cholesterol diet and  exercise.  Follow lipid panel and liver function tests.  Will try to increased to 4/week.        Relevant Orders   Hepatic function panel   Lipid panel   Hyperglycemia    Low carb diet and exercise.  Follow met b and a1c.        Relevant Orders   Hemoglobin A1c   Hypertension    Blood pressure under good control.  Continue same medication regimen.  Follow pressures.  Follow metabolic panel.         Relevant Orders   Basic metabolic panel (future)   Leg pain    On gabapentin.  Thought the 3/day caused increased swelling.  Will try to increase back to 3 q hs and see if this helps with her leg discomfort.        Obstructive sleep apnea    CPAP  Addendum:  Pt is compliant with CPAP and uses 7-8 hours each night with noted benefit.        Osteopenia    Followed by oncology.        Other Visit Diagnoses    Routine general medical examination at a health care facility    -  Primary       Einar Pheasant, MD

## 2019-11-04 ENCOUNTER — Encounter: Payer: Self-pay | Admitting: Internal Medicine

## 2019-11-04 DIAGNOSIS — M79606 Pain in leg, unspecified: Secondary | ICD-10-CM | POA: Insufficient documentation

## 2019-11-04 NOTE — Assessment & Plan Note (Signed)
On arimidex. Followed by oncology.  

## 2019-11-04 NOTE — Assessment & Plan Note (Addendum)
CPAP  Addendum:  Pt is compliant with CPAP and uses 7-8 hours each night with noted benefit.

## 2019-11-04 NOTE — Assessment & Plan Note (Addendum)
On crestor.  Low cholesterol diet and exercise.  Follow lipid panel and liver function tests.  Will try to increased to 4/week.

## 2019-11-04 NOTE — Assessment & Plan Note (Signed)
Blood pressure under good control.  Continue same medication regimen.  Follow pressures.  Follow metabolic panel.   

## 2019-11-04 NOTE — Assessment & Plan Note (Signed)
On gabapentin.  Thought the 3/day caused increased swelling.  Will try to increase back to 3 q hs and see if this helps with her leg discomfort.

## 2019-11-04 NOTE — Assessment & Plan Note (Signed)
Chronic. 

## 2019-11-04 NOTE — Assessment & Plan Note (Signed)
Followed by oncology 

## 2019-11-04 NOTE — Assessment & Plan Note (Signed)
Low carb diet and exercise.  Follow met b and a1c.   

## 2019-11-04 NOTE — Assessment & Plan Note (Signed)
Has been followed by GI.  On aciphex.

## 2019-11-06 ENCOUNTER — Other Ambulatory Visit: Payer: Self-pay | Admitting: Internal Medicine

## 2019-11-24 ENCOUNTER — Other Ambulatory Visit: Payer: Self-pay | Admitting: Internal Medicine

## 2019-11-25 ENCOUNTER — Encounter: Payer: Self-pay | Admitting: Internal Medicine

## 2019-12-11 ENCOUNTER — Other Ambulatory Visit: Payer: Self-pay | Admitting: Internal Medicine

## 2019-12-27 ENCOUNTER — Other Ambulatory Visit: Payer: Self-pay | Admitting: Gastroenterology

## 2019-12-27 DIAGNOSIS — R1010 Upper abdominal pain, unspecified: Secondary | ICD-10-CM

## 2019-12-27 DIAGNOSIS — Z1211 Encounter for screening for malignant neoplasm of colon: Secondary | ICD-10-CM | POA: Diagnosis not present

## 2019-12-27 DIAGNOSIS — R1013 Epigastric pain: Secondary | ICD-10-CM | POA: Diagnosis not present

## 2020-01-08 ENCOUNTER — Encounter (HOSPITAL_BASED_OUTPATIENT_CLINIC_OR_DEPARTMENT_OTHER)
Admission: RE | Admit: 2020-01-08 | Discharge: 2020-01-08 | Disposition: A | Discharge status: Home / Self Care | Payer: Medicare HMO | Source: Ambulatory Visit | Attending: Gastroenterology | Admitting: Gastroenterology

## 2020-01-08 ENCOUNTER — Other Ambulatory Visit: Payer: Self-pay

## 2020-01-08 ENCOUNTER — Ambulatory Visit
Admission: RE | Admit: 2020-01-08 | Discharge: 2020-01-08 | Disposition: A | Discharge status: Home / Self Care | Payer: Medicare HMO | Source: Ambulatory Visit | Attending: Gastroenterology | Admitting: Gastroenterology

## 2020-01-08 DIAGNOSIS — R1010 Upper abdominal pain, unspecified: Secondary | ICD-10-CM

## 2020-01-08 DIAGNOSIS — R1013 Epigastric pain: Secondary | ICD-10-CM | POA: Diagnosis not present

## 2020-01-08 MED ORDER — TECHNETIUM TC 99M MEBROFENIN IV KIT
5.0000 | PACK | Freq: Once | INTRAVENOUS | Status: AC | PRN
  Administered 2020-01-08: 10:00:00 5.042 via INTRAVENOUS

## 2020-01-28 ENCOUNTER — Encounter: Payer: Self-pay | Admitting: Internal Medicine

## 2020-01-29 DIAGNOSIS — M79642 Pain in left hand: Secondary | ICD-10-CM | POA: Diagnosis not present

## 2020-01-29 DIAGNOSIS — M79641 Pain in right hand: Secondary | ICD-10-CM | POA: Diagnosis not present

## 2020-01-29 DIAGNOSIS — M8580 Other specified disorders of bone density and structure, unspecified site: Secondary | ICD-10-CM | POA: Diagnosis not present

## 2020-01-29 DIAGNOSIS — K219 Gastro-esophageal reflux disease without esophagitis: Secondary | ICD-10-CM | POA: Diagnosis not present

## 2020-01-29 DIAGNOSIS — Z853 Personal history of malignant neoplasm of breast: Secondary | ICD-10-CM | POA: Diagnosis not present

## 2020-01-29 DIAGNOSIS — M25561 Pain in right knee: Secondary | ICD-10-CM | POA: Diagnosis not present

## 2020-01-29 DIAGNOSIS — M25562 Pain in left knee: Secondary | ICD-10-CM | POA: Diagnosis not present

## 2020-01-29 DIAGNOSIS — C50911 Malignant neoplasm of unspecified site of right female breast: Secondary | ICD-10-CM | POA: Diagnosis not present

## 2020-01-29 DIAGNOSIS — Z170 Estrogen receptor positive status [ER+]: Secondary | ICD-10-CM | POA: Diagnosis not present

## 2020-01-29 DIAGNOSIS — C50211 Malignant neoplasm of upper-inner quadrant of right female breast: Secondary | ICD-10-CM | POA: Diagnosis not present

## 2020-01-29 LAB — HM MAMMOGRAPHY

## 2020-02-11 DIAGNOSIS — E7800 Pure hypercholesterolemia, unspecified: Secondary | ICD-10-CM | POA: Diagnosis not present

## 2020-02-11 DIAGNOSIS — I10 Essential (primary) hypertension: Secondary | ICD-10-CM | POA: Diagnosis not present

## 2020-02-11 DIAGNOSIS — R0789 Other chest pain: Secondary | ICD-10-CM | POA: Diagnosis not present

## 2020-02-12 ENCOUNTER — Telehealth: Payer: Self-pay

## 2020-02-12 ENCOUNTER — Other Ambulatory Visit: Payer: Self-pay

## 2020-02-12 MED ORDER — TRIAMTERENE-HCTZ 37.5-25 MG PO TABS: 1.0000 | ORAL_TABLET | Freq: Every day | ORAL | 1 refills | Status: DC

## 2020-02-12 NOTE — Telephone Encounter (Signed)
Patient dropped off sleep study for me to send to another company for CPAP supplies. Previous company is no longer in network. I have reached out to [facility] Patient. They are faxing over an order form for supplies and will need returned with last office note and sleep study. Last OV note 10/2019 will need to note that she is using her machine and benefiting. I have left a message for pt to give update and get current pressure settings. Can you addend her last note?

## 2020-02-12 NOTE — Telephone Encounter (Signed)
Patient returned phone call. She will be home for the next hour.

## 2020-02-12 NOTE — Telephone Encounter (Signed)
Called patient. She thinks her machine is an auto titrate 4-20 but she does not want a machine right now. Just supplies. Advised that I would call once order form was received and completed.

## 2020-02-13 NOTE — Telephone Encounter (Signed)
Can sign order once received.  Please document with pt - how many hours per night using and if using nightly.  Does she need a doxy visit - to update use and document compliance?

## 2020-02-15 NOTE — Telephone Encounter (Signed)
Added addendum to note.

## 2020-02-15 NOTE — Telephone Encounter (Signed)
Pt uses every night about 7-8 hrs a night but does not need a doxy visit in order to send forms. Your note from 10/2019 will be fine once addended. She has a f/u with you in March.

## 2020-02-18 NOTE — Telephone Encounter (Signed)
Form completed and placed in quick sign 

## 2020-02-19 NOTE — Telephone Encounter (Signed)
Faxed to [place] [facility]

## 2020-02-19 NOTE — Telephone Encounter (Signed)
Signed form and placed in box.   

## 2020-03-04 NOTE — Telephone Encounter (Signed)
Pt called and said that they have not received the fax about her CPAP supplies Please call pt back when re faxed so she can call the company

## 2020-03-05 NOTE — Telephone Encounter (Signed)
Faxed

## 2020-03-06 ENCOUNTER — Telehealth: Payer: Self-pay

## 2020-03-06 ENCOUNTER — Encounter: Payer: Self-pay | Admitting: Internal Medicine

## 2020-03-06 DIAGNOSIS — M8580 Other specified disorders of bone density and structure, unspecified site: Secondary | ICD-10-CM

## 2020-03-06 MED ORDER — GABAPENTIN 100 MG PO CAPS: 200.0000 mg | ORAL_CAPSULE | Freq: Every day | ORAL | 3 refills | Status: DC

## 2020-03-06 NOTE — Telephone Encounter (Signed)
Lab ordered. Gabapentin sent in. My chart message sent to update pt.

## 2020-03-06 NOTE — Telephone Encounter (Signed)
Pt aware.

## 2020-03-07 ENCOUNTER — Other Ambulatory Visit (INDEPENDENT_AMBULATORY_CARE_PROVIDER_SITE_OTHER): Payer: Medicare HMO

## 2020-03-07 ENCOUNTER — Other Ambulatory Visit: Payer: Self-pay

## 2020-03-07 DIAGNOSIS — I10 Essential (primary) hypertension: Secondary | ICD-10-CM | POA: Diagnosis not present

## 2020-03-07 DIAGNOSIS — M8580 Other specified disorders of bone density and structure, unspecified site: Secondary | ICD-10-CM

## 2020-03-07 DIAGNOSIS — E7800 Pure hypercholesterolemia, unspecified: Secondary | ICD-10-CM | POA: Diagnosis not present

## 2020-03-07 DIAGNOSIS — R739 Hyperglycemia, unspecified: Secondary | ICD-10-CM

## 2020-03-07 LAB — LIPID PANEL
Cholesterol: 176 mg/dL (ref 0–200)
HDL: 68.6 mg/dL (ref >39.00)
LDL Cholesterol: 87 mg/dL (ref 0–99)
NonHDL: 107.47
Total CHOL/HDL Ratio: 3
Triglycerides: 102 mg/dL (ref 0.0–149.0)
VLDL: 20.4 mg/dL (ref 0.0–40.0)

## 2020-03-07 LAB — HEPATIC FUNCTION PANEL
ALT: 15 U/L (ref 0–35)
AST: 16 U/L (ref 0–37)
Albumin: 4 g/dL (ref 3.5–5.2)
Alkaline Phosphatase: 49 U/L (ref 39–117)
Bilirubin, Direct: 0.1 mg/dL (ref 0.0–0.3)
Total Bilirubin: 0.6 mg/dL (ref 0.2–1.2)
Total Protein: 6.4 g/dL (ref 6.0–8.3)

## 2020-03-07 LAB — BASIC METABOLIC PANEL
BUN: 23 mg/dL (ref 6–23)
CO2: 29 mEq/L (ref 19–32)
Calcium: 9.8 mg/dL (ref 8.4–10.5)
Chloride: 102 mEq/L (ref 96–112)
Creatinine, Ser: 1.03 mg/dL (ref 0.40–1.20)
GFR: 52.09 mL/min — ABNORMAL LOW (ref >60.00)
Glucose, Bld: 94 mg/dL (ref 70–99)
Potassium: 4 mEq/L (ref 3.5–5.1)
Sodium: 138 mEq/L (ref 135–145)

## 2020-03-07 LAB — VITAMIN D 25 HYDROXY (VIT D DEFICIENCY, FRACTURES): VITD: 78.59 ng/mL (ref 30.00–100.00)

## 2020-03-07 LAB — HEMOGLOBIN A1C: Hgb A1c MFr Bld: 6.2 % (ref 4.6–6.5)

## 2020-03-08 ENCOUNTER — Encounter: Payer: Self-pay | Admitting: Internal Medicine

## 2020-03-10 DIAGNOSIS — M778 Other enthesopathies, not elsewhere classified: Secondary | ICD-10-CM | POA: Diagnosis not present

## 2020-03-10 DIAGNOSIS — Q828 Other specified congenital malformations of skin: Secondary | ICD-10-CM | POA: Diagnosis not present

## 2020-03-11 ENCOUNTER — Encounter: Payer: Self-pay | Admitting: Internal Medicine

## 2020-03-11 ENCOUNTER — Ambulatory Visit (INDEPENDENT_AMBULATORY_CARE_PROVIDER_SITE_OTHER): Payer: Medicare HMO | Admitting: Internal Medicine

## 2020-03-11 ENCOUNTER — Other Ambulatory Visit: Payer: Self-pay

## 2020-03-11 DIAGNOSIS — R079 Chest pain, unspecified: Secondary | ICD-10-CM | POA: Diagnosis not present

## 2020-03-11 DIAGNOSIS — R739 Hyperglycemia, unspecified: Secondary | ICD-10-CM | POA: Diagnosis not present

## 2020-03-11 DIAGNOSIS — G4733 Obstructive sleep apnea (adult) (pediatric): Secondary | ICD-10-CM

## 2020-03-11 DIAGNOSIS — C50919 Malignant neoplasm of unspecified site of unspecified female breast: Secondary | ICD-10-CM

## 2020-03-11 DIAGNOSIS — E7800 Pure hypercholesterolemia, unspecified: Secondary | ICD-10-CM

## 2020-03-11 DIAGNOSIS — I10 Essential (primary) hypertension: Secondary | ICD-10-CM

## 2020-03-11 DIAGNOSIS — R944 Abnormal results of kidney function studies: Secondary | ICD-10-CM | POA: Diagnosis not present

## 2020-03-11 NOTE — Progress Notes (Addendum)
Patient ID: JANAYSHA DEPAULO, female   DOB: 02-08-1946, 74 y.o.   MRN: [id number]   Subjective:    Patient ID: [name], female    DOB: 1946/06/21, 74 y.o.   MRN: [id number]  HPI This visit occurred during the SARS-CoV-2 public health emergency.  Safety protocols were in place, including screening questions prior to the visit, additional usage of staff PPE, and extensive cleaning of exam room while observing appropriate contact time as indicated for disinfecting solutions.  Patient here for a scheduled follow up.  She reports things are relatively stable.  Saw cardiology 02/11/20.  Previous stress echo - no ischemia.  Felt stable.  Recommended f/u in 9 months.  Continues on crestor.  Saw oncology 01/2020.  On arimidex.  Stable.  Recommended - 10 years therapy.  Planning for f/u bone density 11/2020.  Stays busy.  Tries to stay active.  Has been evaluated by GI for epigastric discomfort.  Taking IBGard daily.  Doing better.  Last episode of vomiting - beginning of February.  Bowels are better.  Stools more formed.  Overall feels better.  Handling stress.     Past Medical History:  Diagnosis Date  . Abdominal hernia   . Anxiety    situational stress and anxiety  . Cancer ([place]) 2015   breast   . GERD (gastroesophageal reflux disease)   . Hiatal hernia   . Hypercholesterolemia   . Hypertension   . Osteoarthritis    scoliosis, degenerative disc dz, knee, carpal tunnel   Past Surgical History:  Procedure Laterality Date  . ABDOMINAL HYSTERECTOMY  07/2000  . APPENDECTOMY  2000  . BREAST BIOPSY Bilateral 2014  . BREAST SURGERY Right 2015   lumpectomy  . CARPAL TUNNEL RELEASE    . COLONOSCOPY  2011   [name], [name]: Normal exam  . HERNIA REPAIR  02/10/15  . Laparoscopic Repair of Lateral Abdominal Wall Hernia  02/10/15  . UPPER GI ENDOSCOPY  05/10/2014   [name], [name] Bile gastritis, medium hiatal hernia. Incomplete visualization of the cardia and fundus.   Family  History  Problem Relation Age of Onset  . Diabetes Mother   . Multiple myeloma Mother   . Diabetes Brother   . Diabetes Sister        x2  . Hypertension Brother   . Multiple myeloma Father    Social History   Socioeconomic History  . Marital status: Married    Spouse name: Not on file  . Number of children: 2  . Years of education: Not on file  . Highest education level: Not on file  Occupational History  . Not on file  Tobacco Use  . Smoking status: Never Smoker  . Smokeless tobacco: Never Used  Substance and Sexual Activity  . Alcohol use: Yes    Alcohol/week: 0.0 standard drinks    Comment: wine occasionally  . Drug use: No  . Sexual activity: Yes  Other Topics Concern  . Not on file  Social History Narrative  . Not on file   Social Determinants of Health   Financial Resource Strain:   . Difficulty of Paying Living Expenses:   Food Insecurity:   . Worried About Charity fundraiser in the Last Year:   . Arboriculturist in the Last Year:   Transportation Needs:   . Film/video editor (Medical):   Marland Kitchen Lack of Transportation (Non-Medical):   Physical Activity:   . Days of Exercise per Week:   .  Minutes of Exercise per Session:   Stress:   . Feeling of Stress :   Social Connections:   . Frequency of Communication with Friends and Family:   . Frequency of Social Gatherings with Friends and Family:   . Attends Religious Services:   . Active Member of Clubs or Organizations:   . Attends Archivist Meetings:   Marland Kitchen Marital Status:     Outpatient Encounter Medications as of 03/11/2020  Medication Sig  . anastrozole (ARIMIDEX) 1 MG tablet TAKE 1 TABLET BY MOUTH ONCE DAILY  . aspirin 81 MG tablet Take 81 mg by mouth daily.  Marland Kitchen azelastine (ASTELIN) 0.1 % nasal spray USE 2 SPRAYS IN BOTH  NOSTRILS TWICE DAILY AS  DIRECTED  . Calcium Carbonate-Vitamin D (CALCIUM 600 + D PO) Take 600 mg by mouth 2 (two) times daily.  . cholecalciferol (VITAMIN D) 1000 UNITS  tablet Take 2,000 Units by mouth daily.   Marland Kitchen CINNAMON PO Take by mouth 2 (two) times daily.  . fluticasone (FLONASE) 50 MCG/ACT nasal spray USE 2 SPRAYS IN EACH  NOSTRIL DAILY  . gabapentin (NEURONTIN) 100 MG capsule Take 2 capsules (200 mg total) by mouth at bedtime.  . irbesartan (AVAPRO) 75 MG tablet TAKE 1 TABLET BY MOUTH  DAILY  . methocarbamol (ROBAXIN) 500 MG tablet Take 1 tablet (500 mg total) by mouth at bedtime as needed for muscle spasms.  . Misc Natural Products (TART CHERRY ADVANCED) CAPS Take 1 capsule by mouth 2 (two) times daily. Reported on 03/04/2016  . Multiple Vitamin (MULTIVITAMIN) tablet Take 1 tablet by mouth daily.  Marland Kitchen PEPPERMINT OIL PO Take by mouth.  . RABEprazole (ACIPHEX) 20 MG tablet Take 1 tablet (20 mg total) by mouth 2 (two) times daily.  . rosuvastatin (CRESTOR) 5 MG tablet TAKE 1 TABLET BY MOUTH  DAILY  . sucralfate (CARAFATE) 1 g tablet Take 1 tablet (1 g total) by mouth 2 (two) times daily.  Marland Kitchen triamterene-hydrochlorothiazide (MAXZIDE-25) 37.5-25 MG tablet Take 1 tablet by mouth daily.   No facility-administered encounter medications on file as of 03/11/2020.    Review of Systems  Constitutional: Negative for appetite change and unexpected weight change.  HENT: Negative for congestion and sinus pressure.   Respiratory: Negative for cough, chest tightness and shortness of breath.   Cardiovascular: Negative for chest pain, palpitations and leg swelling.  Gastrointestinal: Negative for diarrhea, nausea and vomiting.       Abdominal discomfort is better.  Bowels more formed.    Genitourinary: Negative for difficulty urinating and dysuria.  Musculoskeletal: Negative for joint swelling and myalgias.  Skin: Negative for color change and rash.  Neurological: Negative for dizziness, light-headedness and headaches.  Psychiatric/Behavioral: Negative for agitation and dysphoric mood.       Objective:    Physical Exam Constitutional:      General: She is not in  acute distress.    Appearance: Normal appearance.  HENT:     Head: Normocephalic and atraumatic.     Right Ear: External ear normal.     Left Ear: External ear normal.  Eyes:     General: No scleral icterus.       Right eye: No discharge.        Left eye: No discharge.     Conjunctiva/sclera: Conjunctivae normal.  Neck:     Thyroid: No thyromegaly.  Cardiovascular:     Rate and Rhythm: Normal rate and regular rhythm.  Pulmonary:     Effort: No respiratory  distress.     Breath sounds: Normal breath sounds. No wheezing.  Abdominal:     General: Bowel sounds are normal.     Palpations: Abdomen is soft.     Tenderness: There is no abdominal tenderness.  Musculoskeletal:        General: No swelling or tenderness.     Cervical back: Neck supple. No tenderness.  Lymphadenopathy:     Cervical: No cervical adenopathy.  Skin:    Findings: No erythema or rash.  Neurological:     Mental Status: She is alert.  Psychiatric:        Mood and Affect: Mood normal.        Behavior: Behavior normal.     BP 136/62   Pulse 72   Temp (!) 96 F (35.6 C)   Resp 16   Ht 5' (1.524 m)   Wt 141 lb (64 kg)   SpO2 99%   BMI 27.54 kg/m  Wt Readings from Last 3 Encounters:  03/11/20 141 lb (64 kg)  11/01/19 148 lb (67.1 kg)  12/29/18 143 lb 3.2 oz (65 kg)     Lab Results  Component Value Date   WBC 4.9 10/30/2019   HGB 12.7 10/30/2019   HCT 37.7 10/30/2019   PLT 223.0 10/30/2019   GLUCOSE 104 (H) 03/25/2020   CHOL 176 03/07/2020   TRIG 102.0 03/07/2020   HDL 68.60 03/07/2020   LDLDIRECT 146.9 01/31/2014   LDLCALC 87 03/07/2020   ALT 15 03/07/2020   AST 16 03/07/2020   NA 139 03/25/2020   K 3.8 03/25/2020   CL 102 03/25/2020   CREATININE 1.11 03/25/2020   BUN 21 03/25/2020   CO2 29 03/25/2020   TSH 2.05 10/30/2019   HGBA1C 6.2 03/07/2020    US Abdomen Complete  Result Date: 01/08/2020 CLINICAL DATA:  Epigastric pain for 3 weeks. EXAM: ABDOMEN ULTRASOUND COMPLETE  COMPARISON:  08/11/2018 FINDINGS: Gallbladder: No gallstones or wall thickening visualized. No sonographic Murphy sign noted by sonographer. Common bile duct: Diameter: 4 mm, within normal limits. Liver: No focal lesion identified. Within normal limits in parenchymal echogenicity. Portal vein is patent on color Doppler imaging with normal direction of blood flow towards the liver. IVC: No abnormality visualized. Pancreas: Visualized portion unremarkable. Spleen: Size and appearance within normal limits. Right Kidney: Length: 7.8 cm. Echogenicity within normal limits. No mass or hydronephrosis visualized. Left Kidney: Length: 8.8 cm. Echogenicity within normal limits. No mass or hydronephrosis visualized. Abdominal aorta: No aneurysm visualized. Other findings: None. IMPRESSION: Unremarkable abdomen ultrasound. Electronically Signed   By: Marlaine Hind [name]   On: 01/08/2020 09:49   NM Hepato W/EjeCT Fract  Result Date: 01/08/2020 CLINICAL DATA:  Upper abdominal pain EXAM: NUCLEAR MEDICINE HEPATOBILIARY IMAGING WITH GALLBLADDER EF VIEWS: Anterior right upper quadrant RADIOPHARMACEUTICALS:  5.042 mCi Tc-48m Choletec IV COMPARISON:  None. FINDINGS: Liver uptake of radiotracer is unremarkable. There is prompt visualization of gallbladder and small bowel, indicating patency of the cystic and common bile ducts. The patient consumed 8 ounces of Ensure orally with calculation of the computer generated ejection fraction of radiotracer from the gallbladder. The patient did not experience clinical symptoms with the oral Ensure consumption. The computer generated ejection fraction of radiotracer from the gallbladder is normal at 72%, normal greater than 33% using the oral agent. IMPRESSION: Study within normal limits. Electronically Signed   By: WLowella GripIII [name]   On: 01/08/2020 15:39       Assessment & Plan:  Problem List Items Addressed This Visit    Breast cancer ([place])    On arimidex.  Just evaluated by  oncology.  Continue arimidex.        Chest pain    Saw cardiology.  Stress test negative for ischemia.  Felt stable.  Continue risk factor modification.  No chest pain now.  Follow.       Decreased GFR    Stay hydrated.  Follow pressures.  Follow metabolic panel.       Relevant Orders   Basic metabolic panel (Completed)   Hypercholesterolemia    On crestor.  Low cholesterol diet and exercsie.  Follow lipid panel and liver function tests.        Hyperglycemia    Low carb diet and exercise.  Follow met b and a1c.        Hypertension    Blood pressure under good control.  Continue same medication regimen - avapro, triam/hctz.  Follow pressures.  Follow metabolic panel.        Obstructive sleep apnea    CPAP - pt uses regularly and does feel benefits from using.            Einar Pheasant, MD

## 2020-03-16 ENCOUNTER — Encounter: Payer: Self-pay | Admitting: Internal Medicine

## 2020-03-16 NOTE — Assessment & Plan Note (Signed)
Low carb diet and exercise.  Follow met b and a1c.   

## 2020-03-16 NOTE — Assessment & Plan Note (Addendum)
CPAP - pt uses regularly and does feel benefits from using.

## 2020-03-16 NOTE — Assessment & Plan Note (Signed)
Saw cardiology.  Stress test negative for ischemia.  Felt stable.  Continue risk factor modification.  No chest pain now.  Follow.

## 2020-03-16 NOTE — Assessment & Plan Note (Signed)
Stay hydrated.  Follow pressures.  Follow metabolic panel.

## 2020-03-16 NOTE — Assessment & Plan Note (Signed)
On crestor.  Low cholesterol diet and exercsie.  Follow lipid panel and liver function tests.   

## 2020-03-16 NOTE — Assessment & Plan Note (Signed)
On arimidex.  Just evaluated by oncology.  Continue arimidex.

## 2020-03-16 NOTE — Assessment & Plan Note (Signed)
Blood pressure under good control.  Continue same medication regimen - avapro, triam/hctz.  Follow pressures.  Follow metabolic panel.

## 2020-03-25 ENCOUNTER — Other Ambulatory Visit: Payer: Self-pay

## 2020-03-25 ENCOUNTER — Other Ambulatory Visit (INDEPENDENT_AMBULATORY_CARE_PROVIDER_SITE_OTHER): Payer: Medicare HMO

## 2020-03-25 DIAGNOSIS — R944 Abnormal results of kidney function studies: Secondary | ICD-10-CM

## 2020-03-25 LAB — BASIC METABOLIC PANEL
BUN: 21 mg/dL (ref 6–23)
CO2: 29 mEq/L (ref 19–32)
Calcium: 9.3 mg/dL (ref 8.4–10.5)
Chloride: 102 mEq/L (ref 96–112)
Creatinine, Ser: 1.11 mg/dL (ref 0.40–1.20)
GFR: 47.77 mL/min — ABNORMAL LOW (ref >60.00)
Glucose, Bld: 104 mg/dL — ABNORMAL HIGH (ref 70–99)
Potassium: 3.8 mEq/L (ref 3.5–5.1)
Sodium: 139 mEq/L (ref 135–145)

## 2020-04-01 ENCOUNTER — Other Ambulatory Visit
Admission: RE | Admit: 2020-04-01 | Discharge: 2020-04-01 | Disposition: A | Discharge status: Home / Self Care | Payer: Medicare HMO | Source: Ambulatory Visit | Attending: Internal Medicine | Admitting: Internal Medicine

## 2020-04-01 DIAGNOSIS — Z20822 Contact with and (suspected) exposure to covid-19: Secondary | ICD-10-CM | POA: Diagnosis not present

## 2020-04-01 DIAGNOSIS — Z01812 Encounter for preprocedural laboratory examination: Secondary | ICD-10-CM | POA: Diagnosis not present

## 2020-04-02 ENCOUNTER — Encounter: Payer: Self-pay | Admitting: Internal Medicine

## 2020-04-02 LAB — SARS CORONAVIRUS 2 (TAT 6-24 HRS): SARS Coronavirus 2: NEGATIVE

## 2020-04-03 ENCOUNTER — Encounter
Admission: RE | Disposition: A | Discharge status: Home / Self Care | Payer: Self-pay | Source: Home / Self Care | Attending: Internal Medicine

## 2020-04-03 ENCOUNTER — Ambulatory Visit: Payer: Medicare HMO | Admitting: Anesthesiology

## 2020-04-03 ENCOUNTER — Other Ambulatory Visit: Payer: Self-pay

## 2020-04-03 ENCOUNTER — Ambulatory Visit
Admission: RE | Admit: 2020-04-03 | Discharge: 2020-04-03 | Disposition: A | Discharge status: Home / Self Care | Payer: Medicare HMO | Attending: Internal Medicine | Admitting: Internal Medicine

## 2020-04-03 ENCOUNTER — Encounter: Payer: Self-pay | Admitting: Internal Medicine

## 2020-04-03 ENCOUNTER — Telehealth: Payer: Self-pay | Admitting: Internal Medicine

## 2020-04-03 DIAGNOSIS — R1013 Epigastric pain: Secondary | ICD-10-CM | POA: Insufficient documentation

## 2020-04-03 DIAGNOSIS — M1990 Unspecified osteoarthritis, unspecified site: Secondary | ICD-10-CM | POA: Insufficient documentation

## 2020-04-03 DIAGNOSIS — E7800 Pure hypercholesterolemia, unspecified: Secondary | ICD-10-CM | POA: Insufficient documentation

## 2020-04-03 DIAGNOSIS — K319 Disease of stomach and duodenum, unspecified: Secondary | ICD-10-CM | POA: Diagnosis not present

## 2020-04-03 DIAGNOSIS — Z7982 Long term (current) use of aspirin: Secondary | ICD-10-CM | POA: Insufficient documentation

## 2020-04-03 DIAGNOSIS — K5790 Diverticulosis of intestine, part unspecified, without perforation or abscess without bleeding: Secondary | ICD-10-CM | POA: Diagnosis not present

## 2020-04-03 DIAGNOSIS — K449 Diaphragmatic hernia without obstruction or gangrene: Secondary | ICD-10-CM | POA: Insufficient documentation

## 2020-04-03 DIAGNOSIS — K5730 Diverticulosis of large intestine without perforation or abscess without bleeding: Secondary | ICD-10-CM | POA: Diagnosis not present

## 2020-04-03 DIAGNOSIS — K2970 Gastritis, unspecified, without bleeding: Secondary | ICD-10-CM | POA: Insufficient documentation

## 2020-04-03 DIAGNOSIS — K640 First degree hemorrhoids: Secondary | ICD-10-CM | POA: Insufficient documentation

## 2020-04-03 DIAGNOSIS — Z853 Personal history of malignant neoplasm of breast: Secondary | ICD-10-CM | POA: Diagnosis not present

## 2020-04-03 DIAGNOSIS — I10 Essential (primary) hypertension: Secondary | ICD-10-CM | POA: Insufficient documentation

## 2020-04-03 DIAGNOSIS — G4733 Obstructive sleep apnea (adult) (pediatric): Secondary | ICD-10-CM | POA: Diagnosis not present

## 2020-04-03 DIAGNOSIS — Z1211 Encounter for screening for malignant neoplasm of colon: Secondary | ICD-10-CM | POA: Insufficient documentation

## 2020-04-03 DIAGNOSIS — K219 Gastro-esophageal reflux disease without esophagitis: Secondary | ICD-10-CM | POA: Insufficient documentation

## 2020-04-03 DIAGNOSIS — Z79899 Other long term (current) drug therapy: Secondary | ICD-10-CM | POA: Diagnosis not present

## 2020-04-03 DIAGNOSIS — K2960 Other gastritis without bleeding: Secondary | ICD-10-CM | POA: Diagnosis not present

## 2020-04-03 DIAGNOSIS — G4730 Sleep apnea, unspecified: Secondary | ICD-10-CM | POA: Insufficient documentation

## 2020-04-03 DIAGNOSIS — K3189 Other diseases of stomach and duodenum: Secondary | ICD-10-CM | POA: Diagnosis not present

## 2020-04-03 DIAGNOSIS — E785 Hyperlipidemia, unspecified: Secondary | ICD-10-CM | POA: Diagnosis not present

## 2020-04-03 DIAGNOSIS — Z79811 Long term (current) use of aromatase inhibitors: Secondary | ICD-10-CM | POA: Diagnosis not present

## 2020-04-03 HISTORY — DX: Chest pain, unspecified: R07.9

## 2020-04-03 HISTORY — DX: Other forms of dyspnea: R06.09

## 2020-04-03 HISTORY — DX: Cortical age-related cataract, unspecified eye: H25.019

## 2020-04-03 HISTORY — PX: ESOPHAGOGASTRODUODENOSCOPY (EGD) WITH PROPOFOL: SHX5813

## 2020-04-03 HISTORY — PX: COLONOSCOPY WITH PROPOFOL: SHX5780

## 2020-04-03 HISTORY — DX: Dyspnea, unspecified: R06.00

## 2020-04-03 HISTORY — DX: Hyperlipidemia, unspecified: E78.5

## 2020-04-03 HISTORY — DX: Sleep apnea, unspecified: G47.30

## 2020-04-03 LAB — HM COLONOSCOPY

## 2020-04-03 SURGERY — ESOPHAGOGASTRODUODENOSCOPY (EGD) WITH PROPOFOL
Anesthesia: General

## 2020-04-03 MED ORDER — GLYCOPYRROLATE 0.2 MG/ML IJ SOLN
INTRAMUSCULAR | Status: AC
  Filled 2020-04-03: qty 1

## 2020-04-03 MED ORDER — PROPOFOL 500 MG/50ML IV EMUL
INTRAVENOUS | Status: AC
  Filled 2020-04-03: qty 50

## 2020-04-03 MED ORDER — GLYCOPYRROLATE 0.2 MG/ML IJ SOLN: 0.2000 mg | Freq: Once | INTRAMUSCULAR | Status: DC

## 2020-04-03 MED ORDER — SODIUM CHLORIDE 0.9 % IV SOLN: INTRAVENOUS | Status: DC

## 2020-04-03 MED ORDER — PROPOFOL 500 MG/50ML IV EMUL
INTRAVENOUS | Status: DC | PRN
  Administered 2020-04-03: 150 ug/kg/min via INTRAVENOUS

## 2020-04-03 MED ORDER — LIDOCAINE HCL (CARDIAC) PF 100 MG/5ML IV SOSY
PREFILLED_SYRINGE | INTRAVENOUS | Status: DC | PRN
  Administered 2020-04-03: 40 mg via INTRAVENOUS

## 2020-04-03 NOTE — Op Note (Signed)
[facility] Gastroenterology Patient Name: Kendricka Feasel Procedure Date: 04/03/2020 8:48 AM MRN: [id number] Account #: [id number] Date of Birth: 01/22/1946 Admit Type: Outpatient Age: 74 Room: [facility] ENDO ROOM 3 Gender: Female Note Status: Finalized Procedure:             Upper GI endoscopy Indications:           Epigastric abdominal pain, Gastro-esophageal reflux                         disease, Hiatal hernia Providers:             Benay Pike. Makeisha Reichert MD, MD Referring MD:          Einar Pheasant, MD (Referring MD) Medicines:             Propofol per Anesthesia Complications:         No immediate complications. Procedure:             Pre-Anesthesia Assessment:                        - The risks and benefits of the procedure and the                         sedation options and risks were discussed with the                         patient. All questions were answered and informed                         consent was obtained.                        - Patient identification and proposed procedure were                         verified prior to the procedure by the nurse. The                         procedure was verified in the procedure room.                        - ASA Grade Assessment: III - A patient with severe                         systemic disease.                        - After reviewing the risks and benefits, the patient                         was deemed in satisfactory condition to undergo the                         procedure.                        After obtaining informed consent, the endoscope was                         passed under direct vision. Throughout the procedure,  the patient's blood pressure, pulse, and oxygen                         saturations were monitored continuously. The Endoscope                         was introduced through the mouth, and advanced to the                         third part of duodenum. The  upper GI endoscopy was                         accomplished without difficulty. The patient tolerated                         the procedure well. Findings:      Moderate tortuosity of the mid to distal esophagus was noted compatible       with a diagnosis of Presbyesophagus.      A large paraesophageal hernia was found.      Segmental mild inflammation characterized by erythema was found in the       gastric antrum. Biopsies were taken with a cold forceps for Helicobacter       pylori testing.      The examined duodenum was normal. Impression:            - Large paraesophageal hernia.                        - Gastritis. Biopsied.                        - Normal examined duodenum. Recommendation:        - Await pathology results.                        - Proceed with colonoscopy Procedure Code(s):     --- Professional ---                        [phone], Esophagogastroduodenoscopy, flexible,                         transoral; with biopsy, single or multiple Diagnosis Code(s):     --- Professional ---                        K21.9, Gastro-esophageal reflux disease without                         esophagitis                        R10.13, Epigastric pain                        K29.70, Gastritis, unspecified, without bleeding                        K44.9, Diaphragmatic hernia without obstruction or                         gangrene CPT copyright 2019 American Medical Association. All  rights reserved. The codes documented in this report are preliminary and upon coder review may  be revised to meet current compliance requirements. Efrain Sella MD, MD 04/03/2020 9:12:09 AM This report has been signed electronically. Number of Addenda: 0 Note Initiated On: 04/03/2020 8:48 AM Estimated Blood Loss:  Estimated blood loss: none.      [facility]

## 2020-04-03 NOTE — Transfer of Care (Signed)
Immediate Anesthesia Transfer of Care Note  Patient: Carla Ryan  Procedure(s) Performed: Procedure(s): ESOPHAGOGASTRODUODENOSCOPY (EGD) WITH PROPOFOL (N/A) COLONOSCOPY WITH PROPOFOL (N/A)  Patient Location: PACU and Endoscopy Unit  Anesthesia Type:General  Level of Consciousness: sedated  Airway & Oxygen Therapy: Patient Spontanous Breathing and Patient connected to nasal cannula oxygen  Post-op Assessment: Report given to RN and Post -op Vital signs reviewed and stable  Post vital signs: Reviewed and stable  Last Vitals:  Vitals:   04/03/20 0808 04/03/20 0927  BP: 140/61 (!) 97/45  Pulse: 80 84  Resp: 16 13  Temp: (!) 36.1 C (!) 36.2 C  SpO2: 123XX123 [id number]    Complications: No apparent anesthesia complications

## 2020-04-03 NOTE — Telephone Encounter (Signed)
Axed signed order to [facility] on 04/03/20 for CPAP and supplies.

## 2020-04-03 NOTE — Op Note (Signed)
[facility] Gastroenterology Patient Name: Carla Ryan Procedure Date: 04/03/2020 8:48 AM MRN: [id number] Account #: [id number] Date of Birth: 25-Dec-1945 Admit Type: Outpatient Age: 74 Room: [facility] ENDO ROOM 3 Gender: Female Note Status: Finalized Procedure:             Colonoscopy Indications:           Screening for colorectal malignant neoplasm Providers:             Benay Pike. Rexanna Reichert MD, MD Referring MD:          Einar Pheasant, MD (Referring MD) Medicines:             Propofol per Anesthesia Complications:         No immediate complications. Procedure:             Pre-Anesthesia Assessment:                        - The risks and benefits of the procedure and the                         sedation options and risks were discussed with the                         patient. All questions were answered and informed                         consent was obtained.                        - Patient identification and proposed procedure were                         verified prior to the procedure by the nurse. The                         procedure was verified in the procedure room.                        - ASA Grade Assessment: III - A patient with severe                         systemic disease.                        - After reviewing the risks and benefits, the patient                         was deemed in satisfactory condition to undergo the                         procedure.                        After obtaining informed consent, the colonoscope was                         passed under direct vision. Throughout the procedure,                         the patient's blood pressure, pulse,  and oxygen                         saturations were monitored continuously. The                         Colonoscope was introduced through the anus and                         advanced to the the cecum, identified by appendiceal                         orifice and ileocecal valve.  The colonoscopy was                         performed without difficulty. The patient tolerated                         the procedure well. The quality of the bowel                         preparation was good. Findings:      The perianal and digital rectal examinations were normal. Pertinent       negatives include normal sphincter tone and no palpable rectal lesions.      A few small-mouthed diverticula were found in the sigmoid colon.      Non-bleeding internal hemorrhoids were found during retroflexion. The       hemorrhoids were Grade I (internal hemorrhoids that do not prolapse).      The exam was otherwise without abnormality. Impression:            - Diverticulosis in the sigmoid colon.                        - Non-bleeding internal hemorrhoids.                        - The examination was otherwise normal.                        - No specimens collected. Recommendation:        - Patient has a contact number available for                         emergencies. The signs and symptoms of potential                         delayed complications were discussed with the patient.                         Return to normal activities tomorrow. Written                         discharge instructions were provided to the patient.                        - Resume previous diet.                        - Continue present medications.                        -  Await pathology results from EGD, also performed                         today.                        - You do NOT require further colon cancer screening                         measures (Annual stool testing (i.e. hemoccult, FIT,                         cologuard), sigmoidoscopy, colonoscopy or CT                         colonography). You should share this recommendation                         with your Primary Care provider.                        - Return to nurse practitioner in 3 months.                        - Follow up with  Stephens November, GI Nurse                         Practioner, in office to discuss results and monitor                         progress.                        - The findings and recommendations were discussed with                         the patient. Procedure Code(s):     --- Professional ---                        [id number], Colorectal cancer screening; colonoscopy on                         individual not meeting criteria for high risk Diagnosis Code(s):     --- Professional ---                        K57.30, Diverticulosis of large intestine without                         perforation or abscess without bleeding                        K64.0, First degree hemorrhoids                        Z12.11, Encounter for screening for malignant neoplasm                         of colon CPT copyright 2019 American Medical Association. All rights reserved. The codes documented in this report are preliminary and upon coder review  may  be revised to meet current compliance requirements. Efrain Sella MD, MD 04/03/2020 9:28:33 AM This report has been signed electronically. Number of Addenda: 0 Note Initiated On: 04/03/2020 8:48 AM Scope Withdrawal Time: 0 hours 6 minutes 19 seconds  Total Procedure Duration: 0 hours 10 minutes 3 seconds  Estimated Blood Loss:  Estimated blood loss: none.      [facility]

## 2020-04-03 NOTE — Anesthesia Preprocedure Evaluation (Signed)
Anesthesia Evaluation  Patient identified by MRN, date of birth, ID band Patient awake    Reviewed: Allergy & Precautions, NPO status , Patient's Chart, lab work & pertinent test results  History of Anesthesia Complications (+) PONV  Airway Mallampati: III  TM Distance: <3 FB     Dental  (+) Caps   Pulmonary sleep apnea and Continuous Positive Airway Pressure Ventilation ,    Pulmonary exam normal        Cardiovascular hypertension, Normal cardiovascular exam     Neuro/Psych Anxiety negative neurological ROS     GI/Hepatic hiatal hernia, GERD  ,  Endo/Other    Renal/GU      Musculoskeletal  (+) Arthritis , Osteoarthritis,    Abdominal Normal abdominal exam  (+)   Peds  Hematology   Anesthesia Other Findings Past Medical History: No date: Abdominal hernia No date: Anxiety     Comment:  situational stress and anxiety 2015: Cancer ([place])     Comment:  breast  No date: Cataract cortical, senile, unspecified laterality No date: Chest pain No date: Exertional dyspnea No date: GERD (gastroesophageal reflux disease) No date: Hiatal hernia No date: Hiatal hernia No date: Hypercholesterolemia No date: Hyperlipidemia No date: Hypertension No date: Osteoarthritis     Comment:  scoliosis, degenerative disc dz, knee, carpal tunnel No date: Sleep apnea Reported aspiration with an upper procedure.  Reproductive/Obstetrics                             Anesthesia Physical Anesthesia Plan  ASA: III  Anesthesia Plan: General   Post-op Pain Management:    Induction: Intravenous  PONV Risk Score and Plan: Propofol infusion  Airway Management Planned: Nasal Cannula  Additional Equipment:   Intra-op Plan:   Post-operative Plan:   Informed Consent: I have reviewed the patients History and Physical, chart, labs and discussed the procedure including the risks, benefits and alternatives for  the proposed anesthesia with the patient or authorized representative who has indicated his/her understanding and acceptance.     Dental advisory given  Plan Discussed with: CRNA and Surgeon  Anesthesia Plan Comments:         Anesthesia Quick Evaluation

## 2020-04-03 NOTE — Anesthesia Procedure Notes (Signed)
Date/Time: 04/03/2020 8:53 AM Performed by: Doreen Salvage, CRNA Pre-anesthesia Checklist: Patient identified, Emergency Drugs available, Suction available and Patient being monitored Patient Re-evaluated:Patient Re-evaluated prior to induction Oxygen Delivery Method: Supernova nasal CPAP Induction Type: IV induction Dental Injury: Teeth and Oropharynx as per pre-operative assessment  Comments: Nasal cannula with etCO2 monitoring

## 2020-04-03 NOTE — Interval H&P Note (Signed)
History and Physical Interval Note:  [id number] AM  [name]  has presented today for surgery, with the diagnosis of [place].  The various methods of treatment have been discussed with the patient and family. After consideration of risks, benefits and other options for treatment, the patient has consented to  Procedure(s): ESOPHAGOGASTRODUODENOSCOPY (EGD) WITH PROPOFOL (N/A) COLONOSCOPY WITH PROPOFOL (N/A) as a surgical intervention.  The patient's history has been reviewed, patient examined, no change in status, stable for surgery.  I have reviewed the patient's chart and labs.  Questions were answered to the patient's satisfaction.     [place], [place]

## 2020-04-03 NOTE — H&P (Signed)
Outpatient short stay form Pre-procedure 04/03/2020 8:42 AM Carla Ryan, [name]  Primary Physician: Einar Pheasant, [name]  Reason for visit:  Epigastric pain, dyspepsia, colon cancer screening.  History of present illness:  74 y/o female with hx of breast cancer presents for intermittent epigastric pain s/p negative abdominal ultrasound and negative CT except for presence of paraesophageal hernia. No dysphagia, hemetemesis.  Patient presents for colonoscopy for colon cancer screening. The patient denies complaints of abdominal pain, significant change in bowel habits, or rectal bleeding.      Current Facility-Administered Medications:  .  0.9 %  sodium chloride infusion, , Intravenous, Continuous, [place], Benay Pike, MD, Last Rate: 20 mL/hr at 04/03/20 0820, New Bag at 04/03/20 0820 .  glycopyrrolate (ROBINUL) injection 0.2 mg, 0.2 mg, Intravenous, Once, Alvin Critchley, MD  Medications Prior to Admission  Medication Sig Dispense Refill Last Dose  . aspirin 81 MG tablet Take 81 mg by mouth daily.   03/31/2020 at Unknown time  . fluticasone (FLONASE) 50 MCG/ACT nasal spray USE 2 SPRAYS IN EACH  NOSTRIL DAILY 48 g 1 04/02/2020 at Unknown time  . gabapentin (NEURONTIN) 100 MG capsule Take 2 capsules (200 mg total) by mouth at bedtime. 180 capsule 3 04/02/2020 at Unknown time  . irbesartan (AVAPRO) 75 MG tablet TAKE 1 TABLET BY MOUTH  DAILY 90 tablet 3 04/03/2020 at Unknown time  . Multiple Vitamin (MULTIVITAMIN) tablet Take 1 tablet by mouth daily.   Past Week at Unknown time  . RABEprazole (ACIPHEX) 20 MG tablet Take 1 tablet (20 mg total) by mouth 2 (two) times daily. 180 tablet 1 04/03/2020 at Unknown time  . sucralfate (CARAFATE) 1 g tablet Take 1 tablet (1 g total) by mouth 2 (two) times daily. 180 tablet 1 04/02/2020 at Unknown time  . triamterene-hydrochlorothiazide (MAXZIDE-25) 37.5-25 MG tablet Take 1 tablet by mouth daily. 90 tablet 1 04/03/2020 at Unknown time  . anastrozole (ARIMIDEX) 1  MG tablet TAKE 1 TABLET BY MOUTH ONCE DAILY     . azelastine (ASTELIN) 0.1 % nasal spray USE 2 SPRAYS IN BOTH  NOSTRILS TWICE DAILY AS  DIRECTED 120 mL 1   . Calcium Carbonate-Vitamin D (CALCIUM 600 + D PO) Take 600 mg by mouth 2 (two) times daily.     . cholecalciferol (VITAMIN D) 1000 UNITS tablet Take 2,000 Units by mouth daily.      Marland Kitchen CINNAMON PO Take by mouth 2 (two) times daily.     . methocarbamol (ROBAXIN) 500 MG tablet Take 1 tablet (500 mg total) by mouth at bedtime as needed for muscle spasms. 30 tablet 0   . Misc Natural Products (TART CHERRY ADVANCED) CAPS Take 1 capsule by mouth 2 (two) times daily. Reported on 03/04/2016     . PEPPERMINT OIL PO Take by mouth.     . rosuvastatin (CRESTOR) 5 MG tablet TAKE 1 TABLET BY MOUTH  DAILY 90 tablet 3      No Known Allergies   Past Medical History:  Diagnosis Date  . Abdominal hernia   . Anxiety    situational stress and anxiety  . Cancer ([place]) 2015   breast   . Cataract cortical, senile, unspecified laterality   . Chest pain   . Exertional dyspnea   . GERD (gastroesophageal reflux disease)   . Hiatal hernia   . Hiatal hernia   . Hypercholesterolemia   . Hyperlipidemia   . Hypertension   . Osteoarthritis    scoliosis, degenerative disc dz, knee, carpal  tunnel  . Sleep apnea     Review of systems:  Otherwise negative.    Physical Exam  Gen: Alert, oriented. Appears stated age.  HEENT: [place]/AT. PERRLA. Lungs: CTA, no wheezes. CV: RR nl S1, S2. Abd: soft, benign, no masses. BS+ Ext: No edema. Pulses 2+    Planned procedures: Proceed with EGD and colonoscopy. The patient understands the nature of the planned procedure, indications, risks, alternatives and potential complications including but not limited to bleeding, infection, perforation, damage to internal organs and possible oversedation/side effects from anesthesia. The patient agrees and gives consent to proceed.  Please refer to procedure notes for findings,  recommendations and patient disposition/instructions.     Carla Ryan, [name] Gastroenterology 04/03/2020  8:42 AM

## 2020-04-03 NOTE — Anesthesia Postprocedure Evaluation (Signed)
Anesthesia Post Note  Patient: Carla Ryan  Procedure(s) Performed: ESOPHAGOGASTRODUODENOSCOPY (EGD) WITH PROPOFOL (N/A ) COLONOSCOPY WITH PROPOFOL (N/A )  Patient location during evaluation: Endoscopy Anesthesia Type: General Level of consciousness: awake and alert and oriented Pain management: pain level controlled Vital Signs Assessment: post-procedure vital signs reviewed and stable Respiratory status: spontaneous breathing Cardiovascular status: blood pressure returned to baseline Anesthetic complications: no     Last Vitals:  Vitals:   04/03/20 0947 04/03/20 0957  BP: (!) 131/57 (!) 140/51  Pulse: 71 67  Resp: 17 18  Temp:    SpO2: 99% 100%    Last Pain:  Vitals:   04/03/20 0957  TempSrc:   PainSc: 0-No pain                 March Joos

## 2020-04-04 ENCOUNTER — Encounter: Payer: Self-pay | Admitting: *Deleted

## 2020-04-04 LAB — SURGICAL PATHOLOGY

## 2020-04-14 ENCOUNTER — Ambulatory Visit (INDEPENDENT_AMBULATORY_CARE_PROVIDER_SITE_OTHER): Payer: Medicare HMO | Admitting: Internal Medicine

## 2020-04-14 ENCOUNTER — Other Ambulatory Visit: Payer: Self-pay

## 2020-04-14 ENCOUNTER — Encounter: Payer: Self-pay | Admitting: Internal Medicine

## 2020-04-14 VITALS — BP 140/70 | HR 94 | Temp 97.3°F | Resp 16 | Ht 60.0 in | Wt 144.2 lb

## 2020-04-14 DIAGNOSIS — G4733 Obstructive sleep apnea (adult) (pediatric): Secondary | ICD-10-CM

## 2020-04-14 DIAGNOSIS — I10 Essential (primary) hypertension: Secondary | ICD-10-CM | POA: Diagnosis not present

## 2020-04-14 DIAGNOSIS — K219 Gastro-esophageal reflux disease without esophagitis: Secondary | ICD-10-CM

## 2020-04-14 DIAGNOSIS — R739 Hyperglycemia, unspecified: Secondary | ICD-10-CM

## 2020-04-14 DIAGNOSIS — R944 Abnormal results of kidney function studies: Secondary | ICD-10-CM

## 2020-04-14 DIAGNOSIS — F439 Reaction to severe stress, unspecified: Secondary | ICD-10-CM | POA: Diagnosis not present

## 2020-04-14 DIAGNOSIS — E7800 Pure hypercholesterolemia, unspecified: Secondary | ICD-10-CM

## 2020-04-14 DIAGNOSIS — C50919 Malignant neoplasm of unspecified site of unspecified female breast: Secondary | ICD-10-CM | POA: Diagnosis not present

## 2020-04-14 MED ORDER — AZELASTINE HCL 0.1 % NA SOLN: NASAL | 3 refills | Status: DC

## 2020-04-14 NOTE — Progress Notes (Signed)
Patient ID: Carla Ryan, female   DOB: 02-19-1946, 74 y.o.   MRN: [id number]   Subjective:    Patient ID: Carla Ryan, female    DOB: 1946-04-15, 74 y.o.   MRN: [id number]  HPI This visit occurred during the SARS-CoV-2 public health emergency.  Safety protocols were in place, including screening questions prior to the visit, additional usage of staff PPE, and extensive cleaning of exam room while observing appropriate contact time as indicated for disinfecting solutions.  Patient here for a scheduled follow up.  She reports increased stress.  Sister is in the hospital.  Overall she feels she is handling things relatively well.  Tries to stay active.  No chest pain or sob reported.  No abdominal pain or bowel change reported.  Recent labs revealed decreased GFR.  Triam/hctz decreased to 1/2 tablet.  Leg elevation and compression hose - if leg swelling. On aciphex - to control upper GI symptoms.     Past Medical History:  Diagnosis Date  . Abdominal hernia   . Anxiety    situational stress and anxiety  . Cancer ([place]) 2015   breast   . Cataract cortical, senile, unspecified laterality   . Chest pain   . Exertional dyspnea   . GERD (gastroesophageal reflux disease)   . Hiatal hernia   . Hiatal hernia   . Hypercholesterolemia   . Hyperlipidemia   . Hypertension   . Osteoarthritis    scoliosis, degenerative disc dz, knee, carpal tunnel  . Sleep apnea    Past Surgical History:  Procedure Laterality Date  . ABDOMINAL HYSTERECTOMY  07/2000  . APPENDECTOMY  2000  . APPENDECTOMY    . BREAST BIOPSY Bilateral 2014  . BREAST SURGERY Right 2015   lumpectomy  . CARPAL TUNNEL RELEASE    . COLONOSCOPY  2011   [name], [name]: Normal exam  . COLONOSCOPY WITH PROPOFOL N/A 04/03/2020   Procedure: COLONOSCOPY WITH PROPOFOL;  Surgeon: [place], Benay Pike, MD;  Location: [facility] ENDOSCOPY;  Service: Gastroenterology;  Laterality: N/A;  . ESOPHAGOGASTRODUODENOSCOPY (EGD) WITH PROPOFOL N/A  04/03/2020   Procedure: ESOPHAGOGASTRODUODENOSCOPY (EGD) WITH PROPOFOL;  Surgeon: [place], Benay Pike, MD;  Location: [facility] ENDOSCOPY;  Service: Gastroenterology;  Laterality: N/A;  . HERNIA REPAIR  02/10/15  . Laparoscopic Repair of Lateral Abdominal Wall Hernia  02/10/15  . MASTECTOMY    . UPPER GI ENDOSCOPY  05/10/2014   [name], [name] Bile gastritis, medium hiatal hernia. Incomplete visualization of the cardia and fundus.   Family History  Problem Relation Age of Onset  . Diabetes Mother   . Multiple myeloma Mother   . Diabetes Brother   . Diabetes Sister        x2  . Hypertension Brother   . Multiple myeloma Father    Social History   Socioeconomic History  . Marital status: Married    Spouse name: Not on file  . Number of children: 2  . Years of education: Not on file  . Highest education level: Not on file  Occupational History  . Not on file  Tobacco Use  . Smoking status: Never Smoker  . Smokeless tobacco: Never Used  Substance and Sexual Activity  . Alcohol use: Yes    Alcohol/week: 0.0 standard drinks    Comment: wine occasionally  . Drug use: No  . Sexual activity: Yes  Other Topics Concern  . Not on file  Social History Narrative  . Not on file   Social Determinants of Health  Financial Resource Strain:   . Difficulty of Paying Living Expenses:   Food Insecurity:   . Worried About Charity fundraiser in the Last Year:   . Arboriculturist in the Last Year:   Transportation Needs:   . Film/video editor (Medical):   Marland Kitchen Lack of Transportation (Non-Medical):   Physical Activity:   . Days of Exercise per Week:   . Minutes of Exercise per Session:   Stress:   . Feeling of Stress :   Social Connections:   . Frequency of Communication with Friends and Family:   . Frequency of Social Gatherings with Friends and Family:   . Attends Religious Services:   . Active Member of Clubs or Organizations:   . Attends Archivist Meetings:   Marland Kitchen  Marital Status:     Outpatient Encounter Medications as of 04/14/2020  Medication Sig  . anastrozole (ARIMIDEX) 1 MG tablet TAKE 1 TABLET BY MOUTH ONCE DAILY  . aspirin 81 MG tablet Take 81 mg by mouth daily.  Marland Kitchen azelastine (ASTELIN) 0.1 % nasal spray USE 2 SPRAYS IN BOTH  NOSTRILS TWICE DAILY AS  DIRECTED  . Calcium Carbonate-Vitamin D (CALCIUM 600 + D PO) Take 600 mg by mouth 2 (two) times daily.  . cholecalciferol (VITAMIN D) 1000 UNITS tablet Take 2,000 Units by mouth daily.   Marland Kitchen CINNAMON PO Take by mouth 2 (two) times daily.  Marland Kitchen gabapentin (NEURONTIN) 100 MG capsule Take 2 capsules (200 mg total) by mouth at bedtime.  . irbesartan (AVAPRO) 75 MG tablet TAKE 1 TABLET BY MOUTH  DAILY  . methocarbamol (ROBAXIN) 500 MG tablet Take 1 tablet (500 mg total) by mouth at bedtime as needed for muscle spasms.  . Misc Natural Products (TART CHERRY ADVANCED) CAPS Take 1 capsule by mouth 2 (two) times daily. Reported on 03/04/2016  . Multiple Vitamin (MULTIVITAMIN) tablet Take 1 tablet by mouth daily.  Marland Kitchen PEPPERMINT OIL PO Take by mouth.  . RABEprazole (ACIPHEX) 20 MG tablet Take 1 tablet (20 mg total) by mouth 2 (two) times daily.  . rosuvastatin (CRESTOR) 5 MG tablet TAKE 1 TABLET BY MOUTH  DAILY  . sucralfate (CARAFATE) 1 g tablet Take 1 tablet (1 g total) by mouth 2 (two) times daily.  Marland Kitchen triamterene-hydrochlorothiazide (MAXZIDE-25) 37.5-25 MG tablet Take 1 tablet by mouth daily. (Patient taking differently: Take 0.5 tablets by mouth daily. )  . [DISCONTINUED] azelastine (ASTELIN) 0.1 % nasal spray USE 2 SPRAYS IN BOTH  NOSTRILS TWICE DAILY AS  DIRECTED  . [DISCONTINUED] fluticasone (FLONASE) 50 MCG/ACT nasal spray USE 2 SPRAYS IN EACH  NOSTRIL DAILY   No facility-administered encounter medications on file as of 04/14/2020.    Review of Systems  Constitutional: Negative for appetite change and unexpected weight change.  HENT: Negative for congestion and sinus pressure.   Respiratory: Negative for  cough, chest tightness and shortness of breath.   Cardiovascular: Negative for chest pain and palpitations.  Gastrointestinal: Negative for abdominal pain, diarrhea, nausea and vomiting.  Genitourinary: Negative for difficulty urinating and dysuria.  Musculoskeletal: Negative for joint swelling and myalgias.  Skin: Negative for color change and rash.  Neurological: Negative for dizziness, light-headedness and headaches.  Psychiatric/Behavioral: Negative for agitation and dysphoric mood.       Objective:    Physical Exam Vitals reviewed.  Constitutional:      General: She is not in acute distress.    Appearance: Normal appearance.  HENT:     Head:  Normocephalic and atraumatic.     Right Ear: External ear normal.     Left Ear: External ear normal.  Eyes:     General:        Right eye: No discharge.        Left eye: No discharge.     Conjunctiva/sclera: Conjunctivae normal.  Neck:     Thyroid: No thyromegaly.  Cardiovascular:     Rate and Rhythm: Normal rate and regular rhythm.  Pulmonary:     Effort: No respiratory distress.     Breath sounds: Normal breath sounds. No wheezing.  Abdominal:     General: Bowel sounds are normal.     Palpations: Abdomen is soft.     Tenderness: There is no abdominal tenderness.  Musculoskeletal:        General: No swelling or tenderness.     Cervical back: Neck supple. No tenderness.  Lymphadenopathy:     Cervical: No cervical adenopathy.  Skin:    Findings: No erythema or rash.  Neurological:     Mental Status: She is alert.  Psychiatric:        Mood and Affect: Mood normal.        Behavior: Behavior normal.     BP 140/70   Pulse 94   Temp (!) 97.3 F (36.3 C)   Resp 16   Ht 5' (1.524 m)   Wt 144 lb 3.2 oz (65.4 kg)   SpO2 99%   BMI 28.16 kg/m  Wt Readings from Last 3 Encounters:  04/14/20 144 lb 3.2 oz (65.4 kg)  04/03/20 139 lb (63 kg)  03/11/20 141 lb (64 kg)     Lab Results  Component Value Date   WBC 4.9  10/30/2019   HGB 12.7 10/30/2019   HCT 37.7 10/30/2019   PLT 223.0 10/30/2019   GLUCOSE 103 (H) 04/14/2020   CHOL 176 03/07/2020   TRIG 102.0 03/07/2020   HDL 68.60 03/07/2020   LDLDIRECT 146.9 01/31/2014   LDLCALC 87 03/07/2020   ALT 15 03/07/2020   AST 16 03/07/2020   NA 137 04/14/2020   K 3.9 04/14/2020   CL 103 04/14/2020   CREATININE 0.91 04/14/2020   BUN 17 04/14/2020   CO2 28 04/14/2020   TSH 2.05 10/30/2019   HGBA1C 6.2 03/07/2020       Assessment & Plan:   Problem List Items Addressed This Visit    Breast cancer ([place])    On arimidex.  Followed by oncology.        Decreased GFR - Primary    Now on 1/2 triam/hctz.  Will need to monitor blood pressures.  Continue avapro.  Follow pressures and adjust medication as needed.  Recheck metabolic panel to confirm renal function improved.        Relevant Orders   Basic metabolic panel (Completed)   GERD (gastroesophageal reflux disease)    Controlled on aciphex.        Hypercholesterolemia    On crestor.  Low cholesterol diet and exercise.  Follow lipid panel and liver function tests.        Hyperglycemia    Low carb diet and exercise.  Follow met b and a1c.       Hypertension    Blood pressure on recheck improved.  On triam/hctz - now taking 1/2 tablet.  Stay hydrated.  Recheck metabolic panel today to confirm kidney function improved.        Obstructive sleep apnea    Continue cpap.  Stress    Increased stress as outlined.  Sister in hospital.  Overall appears to be handling things relatively well.  Follow.            Einar Pheasant, MD

## 2020-04-15 LAB — BASIC METABOLIC PANEL
BUN: 17 mg/dL (ref 6–23)
CO2: 28 mEq/L (ref 19–32)
Calcium: 10 mg/dL (ref 8.4–10.5)
Chloride: 103 mEq/L (ref 96–112)
Creatinine, Ser: 0.91 mg/dL (ref 0.40–1.20)
GFR: 60.07 mL/min (ref >60.00)
Glucose, Bld: 103 mg/dL — ABNORMAL HIGH (ref 70–99)
Potassium: 3.9 mEq/L (ref 3.5–5.1)
Sodium: 137 mEq/L (ref 135–145)

## 2020-04-16 ENCOUNTER — Encounter: Payer: Self-pay | Admitting: Internal Medicine

## 2020-04-17 MED ORDER — FLUTICASONE PROPIONATE 50 MCG/ACT NA SUSP: 2.0000 | Freq: Every day | NASAL | 1 refills | Status: DC

## 2020-04-17 NOTE — Telephone Encounter (Signed)
rx sent in for flonase (3 month supply) with 1 refill.

## 2020-04-20 ENCOUNTER — Encounter: Payer: Self-pay | Admitting: Internal Medicine

## 2020-04-20 DIAGNOSIS — F439 Reaction to severe stress, unspecified: Secondary | ICD-10-CM | POA: Insufficient documentation

## 2020-04-20 NOTE — Assessment & Plan Note (Signed)
Continue cpap.  

## 2020-04-20 NOTE — Assessment & Plan Note (Signed)
On crestor.  Low cholesterol diet and exercise.  Follow lipid panel and liver function tests.   

## 2020-04-20 NOTE — Assessment & Plan Note (Signed)
Blood pressure on recheck improved.  On triam/hctz - now taking 1/2 tablet.  Stay hydrated.  Recheck metabolic panel today to confirm kidney function improved.

## 2020-04-20 NOTE — Assessment & Plan Note (Signed)
Controlled on aciphex.   

## 2020-04-20 NOTE — Assessment & Plan Note (Signed)
Increased stress as outlined.  Sister in hospital.  Overall appears to be handling things relatively well.  Follow.

## 2020-04-20 NOTE — Assessment & Plan Note (Signed)
Now on 1/2 triam/hctz.  Will need to monitor blood pressures.  Continue avapro.  Follow pressures and adjust medication as needed.  Recheck metabolic panel to confirm renal function improved.

## 2020-04-20 NOTE — Assessment & Plan Note (Signed)
Low carb diet and exercise.  Follow met b and a1c.  

## 2020-04-20 NOTE — Assessment & Plan Note (Signed)
On arimidex. Followed by oncology.  

## 2020-04-28 DIAGNOSIS — G4733 Obstructive sleep apnea (adult) (pediatric): Secondary | ICD-10-CM | POA: Diagnosis not present

## 2020-05-28 DIAGNOSIS — G4733 Obstructive sleep apnea (adult) (pediatric): Secondary | ICD-10-CM | POA: Diagnosis not present

## 2020-05-29 DIAGNOSIS — G4733 Obstructive sleep apnea (adult) (pediatric): Secondary | ICD-10-CM | POA: Diagnosis not present

## 2020-06-16 ENCOUNTER — Other Ambulatory Visit: Payer: Self-pay | Admitting: Internal Medicine

## 2020-06-28 DIAGNOSIS — G4733 Obstructive sleep apnea (adult) (pediatric): Secondary | ICD-10-CM | POA: Diagnosis not present

## 2020-07-05 DIAGNOSIS — G4733 Obstructive sleep apnea (adult) (pediatric): Secondary | ICD-10-CM | POA: Diagnosis not present

## 2020-07-10 ENCOUNTER — Other Ambulatory Visit: Payer: Self-pay | Admitting: Internal Medicine

## 2020-07-10 ENCOUNTER — Other Ambulatory Visit: Payer: Self-pay

## 2020-07-10 ENCOUNTER — Other Ambulatory Visit (INDEPENDENT_AMBULATORY_CARE_PROVIDER_SITE_OTHER): Payer: Medicare HMO

## 2020-07-10 DIAGNOSIS — R739 Hyperglycemia, unspecified: Secondary | ICD-10-CM

## 2020-07-10 DIAGNOSIS — I10 Essential (primary) hypertension: Secondary | ICD-10-CM | POA: Diagnosis not present

## 2020-07-10 DIAGNOSIS — E7800 Pure hypercholesterolemia, unspecified: Secondary | ICD-10-CM

## 2020-07-10 LAB — BASIC METABOLIC PANEL
BUN: 24 mg/dL — ABNORMAL HIGH (ref 6–23)
CO2: 30 mEq/L (ref 19–32)
Calcium: 9.5 mg/dL (ref 8.4–10.5)
Chloride: 103 mEq/L (ref 96–112)
Creatinine, Ser: 1.04 mg/dL (ref 0.40–1.20)
GFR: 51.46 mL/min — ABNORMAL LOW (ref >60.00)
Glucose, Bld: 104 mg/dL — ABNORMAL HIGH (ref 70–99)
Potassium: 4 mEq/L (ref 3.5–5.1)
Sodium: 137 mEq/L (ref 135–145)

## 2020-07-10 LAB — LIPID PANEL
Cholesterol: 181 mg/dL (ref 0–200)
HDL: 63.8 mg/dL (ref >39.00)
LDL Cholesterol: 89 mg/dL (ref 0–99)
NonHDL: 116.79
Total CHOL/HDL Ratio: 3
Triglycerides: 137 mg/dL (ref 0.0–149.0)
VLDL: 27.4 mg/dL (ref 0.0–40.0)

## 2020-07-10 LAB — HEPATIC FUNCTION PANEL
ALT: 13 U/L (ref 0–35)
AST: 15 U/L (ref 0–37)
Albumin: 4 g/dL (ref 3.5–5.2)
Alkaline Phosphatase: 45 U/L (ref 39–117)
Bilirubin, Direct: 0 mg/dL (ref 0.0–0.3)
Total Bilirubin: 0.5 mg/dL (ref 0.2–1.2)
Total Protein: 6.4 g/dL (ref 6.0–8.3)

## 2020-07-10 LAB — HEMOGLOBIN A1C: Hgb A1c MFr Bld: 6 % (ref 4.6–6.5)

## 2020-07-10 NOTE — Progress Notes (Signed)
Orders placed for f/u labs.  

## 2020-07-14 ENCOUNTER — Other Ambulatory Visit: Payer: Self-pay

## 2020-07-14 ENCOUNTER — Ambulatory Visit (INDEPENDENT_AMBULATORY_CARE_PROVIDER_SITE_OTHER): Payer: Medicare HMO | Admitting: Internal Medicine

## 2020-07-14 ENCOUNTER — Encounter: Payer: Self-pay | Admitting: Internal Medicine

## 2020-07-14 VITALS — BP 134/68 | HR 77 | Temp 97.8°F | Resp 16 | Ht 60.0 in | Wt 143.2 lb

## 2020-07-14 DIAGNOSIS — C50919 Malignant neoplasm of unspecified site of unspecified female breast: Secondary | ICD-10-CM

## 2020-07-14 DIAGNOSIS — I10 Essential (primary) hypertension: Secondary | ICD-10-CM | POA: Diagnosis not present

## 2020-07-14 DIAGNOSIS — E7800 Pure hypercholesterolemia, unspecified: Secondary | ICD-10-CM

## 2020-07-14 DIAGNOSIS — R739 Hyperglycemia, unspecified: Secondary | ICD-10-CM | POA: Diagnosis not present

## 2020-07-14 DIAGNOSIS — G4733 Obstructive sleep apnea (adult) (pediatric): Secondary | ICD-10-CM | POA: Diagnosis not present

## 2020-07-14 DIAGNOSIS — R944 Abnormal results of kidney function studies: Secondary | ICD-10-CM

## 2020-07-14 DIAGNOSIS — F439 Reaction to severe stress, unspecified: Secondary | ICD-10-CM | POA: Diagnosis not present

## 2020-07-14 NOTE — Assessment & Plan Note (Addendum)
Will hold triam/hctz.  Follow pressures.  Stay hydrated.  Recheck metabolic panel.  Get her back in soon to reassess.  Discussed avoiding antiinflammatories.

## 2020-07-14 NOTE — Patient Instructions (Signed)
Hold triam/hctz as we discussed.    Monitor your blood pressures.    Let me know if any problems.

## 2020-07-14 NOTE — Assessment & Plan Note (Signed)
Low carb diet and exercise.  Follow met b and a1c.  a1c just checked 6.0.

## 2020-07-14 NOTE — Assessment & Plan Note (Signed)
On crestor.  Low cholesterol diet and exercise.  Follow lipid panel and liver function tests.   Lab Results  Component Value Date   CHOL 181 07/10/2020   HDL 63.80 07/10/2020   LDLCALC 89 07/10/2020   LDLDIRECT 146.9 01/31/2014   TRIG 137.0 07/10/2020   CHOLHDL 3 07/10/2020

## 2020-07-14 NOTE — Assessment & Plan Note (Signed)
Blood pressure as outlined.  Had the episodes of low blood pressure as outlined.  Discussed labs and staying hydrated.  Will hold triam/hctz.  She will have if needed, but will stop the daily dose.  Continue avapro.  Follow pressures.  Follow metabolic panel.

## 2020-07-14 NOTE — Assessment & Plan Note (Signed)
On arimidex. Followed by oncology.  

## 2020-07-14 NOTE — Assessment & Plan Note (Signed)
Discussed with her today.  Overall she feels she is handling things well.  Follow.

## 2020-07-14 NOTE — Progress Notes (Signed)
Patient ID: Carla Ryan, female   DOB: 11/21/1946, 74 y.o.   MRN: [id number]   Subjective:    Patient ID: Carla Ryan, female    DOB: [birth year], 74 y.o.   MRN: [id number]  HPI This visit occurred during the SARS-CoV-2 public health emergency.  Safety protocols were in place, including screening questions prior to the visit, additional usage of staff PPE, and extensive cleaning of exam room while observing appropriate contact time as indicated for disinfecting solutions.  Patient here for a scheduled follow up.  She reports she is doing relatively well. Using cpap regularly - every night - averages 6-8 hours per night.  Feel more rested since using.  Not as tired.  Sleeping better.  Tries to stay active. No chest pain or sob reported.  Some decreased hearing.  Plans to f/u with Dr Richardson Landry.  No acid reflux or abdominal pain.  Bowels moving.  Discussed labs.  Decreased GFR when compared to the last check.  Discussed staying hydrated. She had an episode on 06/03/20 - felt faint.  No syncope.  Blood pressure 95/52.  Had one other episode 06/17/20 - blood pressure 99/51.  Has not had recurring episodes.  No known triggers.  Discussed staying hydrated and eating regular meals.  States normally blood pressures averaging 115-130/60-70.     Past Medical History:  Diagnosis Date  . Abdominal hernia   . Anxiety    situational stress and anxiety  . Cancer ([place]) 2015   breast   . Cataract cortical, senile, unspecified laterality   . Chest pain   . Exertional dyspnea   . GERD (gastroesophageal reflux disease)   . Hiatal hernia   . Hiatal hernia   . Hypercholesterolemia   . Hyperlipidemia   . Hypertension   . Osteoarthritis    scoliosis, degenerative disc dz, knee, carpal tunnel  . Sleep apnea    Past Surgical History:  Procedure Laterality Date  . ABDOMINAL HYSTERECTOMY  07/2000  . APPENDECTOMY  2000  . APPENDECTOMY    . BREAST BIOPSY Bilateral 2014  . BREAST SURGERY Right 2015    lumpectomy  . CARPAL TUNNEL RELEASE    . COLONOSCOPY  2011   [name], [name]: Normal exam  . COLONOSCOPY WITH PROPOFOL N/A 04/03/2020   Procedure: COLONOSCOPY WITH PROPOFOL;  Surgeon: Toledo, Benay Pike, MD;  Location: [facility] ENDOSCOPY;  Service: Gastroenterology;  Laterality: N/A;  . ESOPHAGOGASTRODUODENOSCOPY (EGD) WITH PROPOFOL N/A 04/03/2020   Procedure: ESOPHAGOGASTRODUODENOSCOPY (EGD) WITH PROPOFOL;  Surgeon: Toledo, Benay Pike, MD;  Location: [facility] ENDOSCOPY;  Service: Gastroenterology;  Laterality: N/A;  . HERNIA REPAIR  02/10/15  . Laparoscopic Repair of Lateral Abdominal Wall Hernia  02/10/15  . MASTECTOMY    . UPPER GI ENDOSCOPY  05/10/2014   [name], [name] Bile gastritis, medium hiatal hernia. Incomplete visualization of the cardia and fundus.   Family History  Problem Relation Age of Onset  . Diabetes Mother   . Multiple myeloma Mother   . Diabetes Brother   . Diabetes Sister        x2  . Hypertension Brother   . Multiple myeloma Father    Social History   Socioeconomic History  . Marital status: Married    Spouse name: Not on file  . Number of children: 2  . Years of education: Not on file  . Highest education level: Not on file  Occupational History  . Not on file  Tobacco Use  . Smoking status: Never Smoker  .  Smokeless tobacco: Never Used  Vaping Use  . Vaping Use: Never used  Substance and Sexual Activity  . Alcohol use: Yes    Alcohol/week: 0.0 standard drinks    Comment: wine occasionally  . Drug use: No  . Sexual activity: Yes  Other Topics Concern  . Not on file  Social History Narrative  . Not on file   Social Determinants of Health   Financial Resource Strain:   . Difficulty of Paying Living Expenses:   Food Insecurity:   . Worried About Charity fundraiser in the Last Year:   . Arboriculturist in the Last Year:   Transportation Needs:   . Film/video editor (Medical):   Marland Kitchen Lack of Transportation (Non-Medical):   Physical  Activity:   . Days of Exercise per Week:   . Minutes of Exercise per Session:   Stress:   . Feeling of Stress :   Social Connections:   . Frequency of Communication with Friends and Family:   . Frequency of Social Gatherings with Friends and Family:   . Attends Religious Services:   . Active Member of Clubs or Organizations:   . Attends Archivist Meetings:   Marland Kitchen Marital Status:     Outpatient Encounter Medications as of 07/14/2020  Medication Sig  . anastrozole (ARIMIDEX) 1 MG tablet TAKE 1 TABLET BY MOUTH ONCE DAILY  . aspirin 81 MG tablet Take 81 mg by mouth daily.  Marland Kitchen azelastine (ASTELIN) 0.1 % nasal spray USE 2 SPRAYS IN BOTH  NOSTRILS TWICE DAILY AS  DIRECTED  . Calcium Carbonate-Vitamin D (CALCIUM 500/VITAMIN D PO)   . Calcium Carbonate-Vitamin D (CALCIUM 600 + D PO) Take 600 mg by mouth 2 (two) times daily.  . cholecalciferol (VITAMIN D) 1000 UNITS tablet Take 2,000 Units by mouth daily.   Marland Kitchen CINNAMON PO Take by mouth 2 (two) times daily.  . fluticasone (FLONASE) 50 MCG/ACT nasal spray Place 2 sprays into both nostrils daily.  Marland Kitchen gabapentin (NEURONTIN) 100 MG capsule Take 2 capsules (200 mg total) by mouth at bedtime.  . irbesartan (AVAPRO) 75 MG tablet TAKE 1 TABLET BY MOUTH  DAILY  . methocarbamol (ROBAXIN) 500 MG tablet Take 1 tablet (500 mg total) by mouth at bedtime as needed for muscle spasms.  . Misc Natural Products (TART CHERRY ADVANCED) CAPS Take 1 capsule by mouth 2 (two) times daily. Reported on 03/04/2016  . MULTIPLE MINERALS-VITAMINS PO   . Multiple Vitamin (MULTIVITAMIN) tablet Take 1 tablet by mouth daily.  Marland Kitchen PEPPERMINT OIL PO Take by mouth.  . RABEprazole (ACIPHEX) 20 MG tablet Take 1 tablet (20 mg total) by mouth 2 (two) times daily.  . rosuvastatin (CRESTOR) 5 MG tablet TAKE 1 TABLET BY MOUTH  DAILY  . sucralfate (CARAFATE) 1 g tablet Take 1 tablet (1 g total) by mouth 2 (two) times daily.  Marland Kitchen triamterene-hydrochlorothiazide (MAXZIDE-25) 37.5-25 MG  tablet Take 0.5 tablets by mouth daily.   No facility-administered encounter medications on file as of 07/14/2020.    Review of Systems  Constitutional: Negative for appetite change and unexpected weight change.  HENT: Negative for congestion and sinus pressure.        Decreased hearing.   Respiratory: Negative for cough, chest tightness and shortness of breath.   Cardiovascular: Negative for chest pain and palpitations.       Some ankle swelling at times.  No swelling extending up leg.  No redness.    Gastrointestinal: Negative for abdominal  pain, diarrhea, nausea and vomiting.  Genitourinary: Negative for difficulty urinating and dysuria.  Musculoskeletal: Negative for joint swelling and myalgias.  Skin: Negative for color change and rash.  Neurological: Negative for dizziness, light-headedness and headaches.  Psychiatric/Behavioral: Negative for agitation and dysphoric mood.       Objective:    Physical Exam Vitals reviewed.  Constitutional:      General: She is not in acute distress.    Appearance: Normal appearance.  HENT:     Head: Normocephalic and atraumatic.     Right Ear: External ear normal.     Left Ear: External ear normal.  Eyes:     General: No scleral icterus.       Right eye: No discharge.        Left eye: No discharge.     Conjunctiva/sclera: Conjunctivae normal.  Neck:     Thyroid: No thyromegaly.  Cardiovascular:     Rate and Rhythm: Normal rate and regular rhythm.  Pulmonary:     Effort: No respiratory distress.     Breath sounds: Normal breath sounds. No wheezing.  Abdominal:     General: Bowel sounds are normal.     Palpations: Abdomen is soft.     Tenderness: There is no abdominal tenderness.  Musculoskeletal:        General: No tenderness.     Cervical back: Neck supple. No tenderness.     Comments: No significant increased pedal edema.  Minimal increased soft tissue - ankle.  No increased erythema.    Lymphadenopathy:     Cervical: No  cervical adenopathy.  Skin:    Findings: No erythema or rash.  Neurological:     Mental Status: She is alert.  Psychiatric:        Mood and Affect: Mood normal.        Behavior: Behavior normal.     BP (!) 134/68   Pulse 77   Temp 97.8 F (36.6 C)   Resp 16   Ht 5' (1.524 m)   Wt 143 lb 3.2 oz (65 kg)   SpO2 99%   BMI 27.97 kg/m  Wt Readings from Last 3 Encounters:  07/14/20 143 lb 3.2 oz (65 kg)  04/14/20 144 lb 3.2 oz (65.4 kg)  04/03/20 139 lb (63 kg)     Lab Results  Component Value Date   WBC 4.9 10/30/2019   HGB 12.7 10/30/2019   HCT 37.7 10/30/2019   PLT 223.0 10/30/2019   GLUCOSE 104 (H) 07/10/2020   CHOL 181 07/10/2020   TRIG 137.0 07/10/2020   HDL 63.80 07/10/2020   LDLDIRECT 146.9 01/31/2014   LDLCALC 89 07/10/2020   ALT 13 07/10/2020   AST 15 07/10/2020   NA 137 07/10/2020   K 4.0 07/10/2020   CL 103 07/10/2020   CREATININE 1.04 07/10/2020   BUN 24 (H) 07/10/2020   CO2 30 07/10/2020   TSH 2.05 10/30/2019   HGBA1C 6.0 07/10/2020       Assessment & Plan:   Problem List Items Addressed This Visit    Breast cancer ([place])    On arimidex.  Followed by oncology.       Decreased GFR    Will hold triam/hctz.  Follow pressures.  Stay hydrated.  Recheck metabolic panel.  Get her back in soon to reassess.  Discussed avoiding antiinflammatories.       Hypercholesterolemia    On crestor.  Low cholesterol diet and exercise.  Follow lipid panel and liver function tests.  Lab Results  Component Value Date   CHOL 181 07/10/2020   HDL 63.80 07/10/2020   LDLCALC 89 07/10/2020   LDLDIRECT 146.9 01/31/2014   TRIG 137.0 07/10/2020   CHOLHDL 3 07/10/2020        Hyperglycemia    Low carb diet and exercise.  Follow met b and a1c.  a1c just checked 6.0.        Hypertension - Primary    Blood pressure as outlined.  Had the episodes of low blood pressure as outlined.  Discussed labs and staying hydrated.  Will hold triam/hctz.  She will have if needed,  but will stop the daily dose.  Continue avapro.  Follow pressures.  Follow metabolic panel.       Relevant Orders   Basic metabolic panel   Obstructive sleep apnea    Using cpap regularly - every night.  Averages 6-8 hours per night.  Feels more rested.  Sleeping better.  She does benefit from using cpap regularly.        Stress    Discussed with her today.  Overall she feels she is handling things well.  Follow.            Einar Pheasant, MD

## 2020-07-14 NOTE — Assessment & Plan Note (Signed)
Using cpap regularly - every night.  Averages 6-8 hours per night.  Feels more rested.  Sleeping better.  She does benefit from using cpap regularly.

## 2020-07-25 DIAGNOSIS — H903 Sensorineural hearing loss, bilateral: Secondary | ICD-10-CM | POA: Diagnosis not present

## 2020-07-25 DIAGNOSIS — H9313 Tinnitus, bilateral: Secondary | ICD-10-CM | POA: Diagnosis not present

## 2020-07-25 DIAGNOSIS — H6123 Impacted cerumen, bilateral: Secondary | ICD-10-CM | POA: Diagnosis not present

## 2020-07-28 DIAGNOSIS — L821 Other seborrheic keratosis: Secondary | ICD-10-CM | POA: Diagnosis not present

## 2020-07-28 DIAGNOSIS — Z86018 Personal history of other benign neoplasm: Secondary | ICD-10-CM | POA: Diagnosis not present

## 2020-07-28 DIAGNOSIS — L603 Nail dystrophy: Secondary | ICD-10-CM | POA: Diagnosis not present

## 2020-07-28 DIAGNOSIS — L578 Other skin changes due to chronic exposure to nonionizing radiation: Secondary | ICD-10-CM | POA: Diagnosis not present

## 2020-07-29 DIAGNOSIS — G4733 Obstructive sleep apnea (adult) (pediatric): Secondary | ICD-10-CM | POA: Diagnosis not present

## 2020-07-31 ENCOUNTER — Encounter: Payer: Self-pay | Admitting: Internal Medicine

## 2020-07-31 DIAGNOSIS — R42 Dizziness and giddiness: Secondary | ICD-10-CM

## 2020-07-31 DIAGNOSIS — R944 Abnormal results of kidney function studies: Secondary | ICD-10-CM

## 2020-08-01 NOTE — Telephone Encounter (Signed)
Called pt and discussed her my chart message.  She is feeling better.  Described the dizziness as being worse with head movements, for example - raising her head, etc.  Some nausea associated.  Has seen ENT recently for tinnitus.  Tinnitus continues.  Dizziness better.  Discussed referral back to ENT.  She agrees.  She also reports blood pressure is elevated she has recently stopped triam/hctz will restart 1/2 tablet per day.  Follow pressures.  Follow kidney function .  Stay hydrated.  Will need to follow renal function.

## 2020-08-02 ENCOUNTER — Telehealth: Payer: Self-pay | Admitting: Internal Medicine

## 2020-08-02 NOTE — Telephone Encounter (Signed)
Please cancel 08/07/20 lab appt and schedule a non fasting lab appt in 3 weeks.  Thanks.

## 2020-08-02 NOTE — Telephone Encounter (Signed)
Order placed for ENT referral and met b ordered.  Cancel appt for 08/07/20 lab and schedule f/u met b in 3 weeks.   

## 2020-08-02 NOTE — Addendum Note (Signed)
Addended by: Alisa Graff on: 08/02/2020 08:35 PM   Modules accepted: Orders

## 2020-08-05 NOTE — Telephone Encounter (Signed)
Called and rescheduled pt for labs 3 weeks out

## 2020-08-06 DIAGNOSIS — R42 Dizziness and giddiness: Secondary | ICD-10-CM | POA: Diagnosis not present

## 2020-08-06 DIAGNOSIS — R040 Epistaxis: Secondary | ICD-10-CM | POA: Diagnosis not present

## 2020-08-06 DIAGNOSIS — H903 Sensorineural hearing loss, bilateral: Secondary | ICD-10-CM | POA: Diagnosis not present

## 2020-08-07 ENCOUNTER — Other Ambulatory Visit: Payer: Medicare HMO

## 2020-08-11 ENCOUNTER — Emergency Department: Payer: Medicare HMO

## 2020-08-11 ENCOUNTER — Encounter: Payer: Self-pay | Admitting: Emergency Medicine

## 2020-08-11 ENCOUNTER — Other Ambulatory Visit: Payer: Self-pay

## 2020-08-11 DIAGNOSIS — Z79899 Other long term (current) drug therapy: Secondary | ICD-10-CM | POA: Diagnosis not present

## 2020-08-11 DIAGNOSIS — R519 Headache, unspecified: Secondary | ICD-10-CM | POA: Diagnosis not present

## 2020-08-11 DIAGNOSIS — I10 Essential (primary) hypertension: Secondary | ICD-10-CM | POA: Insufficient documentation

## 2020-08-11 DIAGNOSIS — Z7982 Long term (current) use of aspirin: Secondary | ICD-10-CM | POA: Diagnosis not present

## 2020-08-11 DIAGNOSIS — K449 Diaphragmatic hernia without obstruction or gangrene: Secondary | ICD-10-CM | POA: Diagnosis not present

## 2020-08-11 DIAGNOSIS — R42 Dizziness and giddiness: Secondary | ICD-10-CM | POA: Insufficient documentation

## 2020-08-11 DIAGNOSIS — Z853 Personal history of malignant neoplasm of breast: Secondary | ICD-10-CM | POA: Diagnosis not present

## 2020-08-11 DIAGNOSIS — R21 Rash and other nonspecific skin eruption: Secondary | ICD-10-CM | POA: Diagnosis not present

## 2020-08-11 DIAGNOSIS — R05 Cough: Secondary | ICD-10-CM | POA: Diagnosis not present

## 2020-08-11 LAB — BASIC METABOLIC PANEL
Anion gap: 13 (ref 5–15)
BUN: 20 mg/dL (ref 8–23)
CO2: 25 mmol/L (ref 22–32)
Calcium: 9.6 mg/dL (ref 8.9–10.3)
Chloride: 100 mmol/L (ref 98–111)
Creatinine, Ser: 0.83 mg/dL (ref 0.44–1.00)
GFR calc Af Amer: >60 mL/min (ref >60)
GFR calc non Af Amer: >60 mL/min (ref >60)
Glucose, Bld: 122 mg/dL — ABNORMAL HIGH (ref 70–99)
Potassium: 4.2 mmol/L (ref 3.5–5.1)
Sodium: 138 mmol/L (ref 135–145)

## 2020-08-11 LAB — CBC
HCT: 40.6 % (ref 36.0–46.0)
Hemoglobin: 14 g/dL (ref 12.0–15.0)
MCH: 31.4 pg (ref 26.0–34.0)
MCHC: 34.5 g/dL (ref 30.0–36.0)
MCV: 91 fL (ref 80.0–100.0)
Platelets: 239 10*3/uL (ref 150–400)
RBC: 4.46 MIL/uL (ref 3.87–5.11)
RDW: 14.2 % (ref 11.5–15.5)
WBC: 9.5 10*3/uL (ref 4.0–10.5)
nRBC: 0 % (ref 0.0–0.2)

## 2020-08-11 LAB — TROPONIN I (HIGH SENSITIVITY): Troponin I (High Sensitivity): <2 ng/L (ref <18)

## 2020-08-11 NOTE — ED Triage Notes (Signed)
Patient presents to the ED with headache, dizziness and nausea that began around 3:30pm.  Patient states, "I layed down for a little bit and I thought it was better, but then I got up and it started all over again.  Patient reports history of high blood pressure.  Patient states she had similar symptoms last week and saw an ENT and they, "couldn't find anything."

## 2020-08-12 ENCOUNTER — Emergency Department
Admission: EM | Admit: 2020-08-12 | Discharge: 2020-08-12 | Disposition: A | Discharge status: Home / Self Care | Payer: Medicare HMO | Attending: Emergency Medicine | Admitting: Emergency Medicine

## 2020-08-12 ENCOUNTER — Telehealth: Payer: Self-pay | Admitting: Internal Medicine

## 2020-08-12 DIAGNOSIS — R42 Dizziness and giddiness: Secondary | ICD-10-CM

## 2020-08-12 NOTE — Telephone Encounter (Addendum)
Patient 's husband called in and stated that his wife had been in the ED sense yesterday and had no water or food the whole time she has been there and wanted to know if he could just bring her home and have a virtual appt with Dr.Scott if she could read what the ED doctors put in her chart

## 2020-08-12 NOTE — Discharge Instructions (Addendum)
Follow-up with your primary care doctor.  Please have your doctor investigate your CPAP machine to see if it is recording any data for you.  Please return here if you get return of symptoms.  If there is a spinning sensation we could do an MRI.

## 2020-08-12 NOTE — Telephone Encounter (Signed)
Patient was evaluated in the ED

## 2020-08-12 NOTE — ED Provider Notes (Signed)
[facility] Emergency Department Provider Note   ____________________________________________   First MD Initiated Contact with Patient 08/12/20 1009     (approximate)  I have reviewed the triage vital signs and the nursing notes.   HISTORY  Chief Complaint Dizziness    HPI Carla Ryan is a 74 y.o. female who reports recurrent episodes of dizziness.  Patient reports its more like lightheadedness and may be unsteadiness when she walks.  Is happened several times since she got her new CPAP machine without the facemask.  She had an episode last week where she went to see ENT but they could not see anything.  She is also had ringing in her ears that they checked for.  She had an episode yesterday that has since resolved.  The episode yesterday was associated with a headache which she has had more of lately and again unsteadiness when she is walking.  She did not have any spinning sensation.  She reports it was more lightheaded again.  She is not running a fever.  She has a little stuffy nose but no cough while she is here.  She is not running a fever.  She is not having a headache currently.         Past Medical History:  Diagnosis Date  . Abdominal hernia   . Anxiety    situational stress and anxiety  . Cancer ([place]) 2015   breast   . Cataract cortical, senile, unspecified laterality   . Chest pain   . Exertional dyspnea   . GERD (gastroesophageal reflux disease)   . Hiatal hernia   . Hiatal hernia   . Hypercholesterolemia   . Hyperlipidemia   . Hypertension   . Osteoarthritis    scoliosis, degenerative disc dz, knee, carpal tunnel  . Sleep apnea     Patient Active Problem List   Diagnosis Date Noted  . Stress 04/20/2020  . Decreased GFR 03/11/2020  . Leg pain 11/04/2019  . Osteopenia 11/04/2018  . Rectal bleeding 06/27/2018  . Joint pain 06/08/2016  . Sleeping difficulty 04/06/2015  . SOB (shortness of breath) 02/02/2015  . Health  care maintenance 02/02/2015  . Paraesophageal hiatal hernia 01/21/2015  . Esophageal reflux 01/21/2015  . Hernia of abdominal cavity 10/22/2014  . Back pain 10/13/2014  . Hyperglycemia 10/08/2014  . Abdominal hernia 10/08/2014  . Breast cancer ([place]) 07/09/2014  . Knee pain, right 07/09/2014  . Obstructive sleep apnea 12/23/2013  . Chest pain 10/01/2013  . Hypertension 10/25/2012  . GERD (gastroesophageal reflux disease) 10/25/2012  . Hypercholesterolemia 10/25/2012  . Allergic rhinitis 10/25/2012    Past Surgical History:  Procedure Laterality Date  . ABDOMINAL HYSTERECTOMY  07/2000  . APPENDECTOMY  2000  . APPENDECTOMY    . BREAST BIOPSY Bilateral 2014  . BREAST SURGERY Right 2015   lumpectomy  . CARPAL TUNNEL RELEASE    . COLONOSCOPY  2011   [name], [name]: Normal exam  . COLONOSCOPY WITH PROPOFOL N/A 04/03/2020   Procedure: COLONOSCOPY WITH PROPOFOL;  Surgeon: Toledo, Benay Pike, MD;  Location: [facility] ENDOSCOPY;  Service: Gastroenterology;  Laterality: N/A;  . ESOPHAGOGASTRODUODENOSCOPY (EGD) WITH PROPOFOL N/A 04/03/2020   Procedure: ESOPHAGOGASTRODUODENOSCOPY (EGD) WITH PROPOFOL;  Surgeon: Toledo, Benay Pike, MD;  Location: [facility] ENDOSCOPY;  Service: Gastroenterology;  Laterality: N/A;  . HERNIA REPAIR  02/10/15  . Laparoscopic Repair of Lateral Abdominal Wall Hernia  02/10/15  . MASTECTOMY    . UPPER GI ENDOSCOPY  05/10/2014   [name],  [name] Bile gastritis, medium hiatal hernia. Incomplete visualization of the cardia and fundus.    Prior to Admission medications   Medication Sig Start Date End Date Taking? Authorizing Provider  anastrozole (ARIMIDEX) 1 MG tablet TAKE 1 TABLET BY MOUTH ONCE DAILY 01/31/17   [provider]  aspirin 81 MG tablet Take 81 mg by mouth daily.    [provider]  azelastine (ASTELIN) 0.1 % nasal spray USE 2 SPRAYS IN BOTH  NOSTRILS TWICE DAILY AS  DIRECTED 04/14/20   Einar Pheasant, MD  Calcium Carbonate-Vitamin D  (CALCIUM 500/VITAMIN D PO)     [provider]  Calcium Carbonate-Vitamin D (CALCIUM 600 + D PO) Take 600 mg by mouth 2 (two) times daily.    [provider]  cholecalciferol (VITAMIN D) 1000 UNITS tablet Take 2,000 Units by mouth daily.     [provider]  CINNAMON PO Take by mouth 2 (two) times daily.    [provider]  fluticasone (FLONASE) 50 MCG/ACT nasal spray Place 2 sprays into both nostrils daily. 04/17/20   Einar Pheasant, MD  gabapentin (NEURONTIN) 100 MG capsule Take 2 capsules (200 mg total) by mouth at bedtime. 03/06/20   Einar Pheasant, MD  irbesartan (AVAPRO) 75 MG tablet TAKE 1 TABLET BY MOUTH  DAILY 09/27/19   Einar Pheasant, MD  methocarbamol (ROBAXIN) 500 MG tablet Take 1 tablet (500 mg total) by mouth at bedtime as needed for muscle spasms. 11/01/19   Einar Pheasant, MD  Misc Natural Products [facility] ADVANCED) CAPS Take 1 capsule by mouth 2 (two) times daily. Reported on 03/04/2016    [provider]  MULTIPLE MINERALS-VITAMINS PO     [provider]  Multiple Vitamin (MULTIVITAMIN) tablet Take 1 tablet by mouth daily.    [provider]  PEPPERMINT OIL PO Take by mouth.    [provider]  RABEprazole (ACIPHEX) 20 MG tablet Take 1 tablet (20 mg total) by mouth 2 (two) times daily. 10/14/17   Einar Pheasant, MD  rosuvastatin (CRESTOR) 5 MG tablet TAKE 1 TABLET BY MOUTH  DAILY 08/28/19   Einar Pheasant, MD  sucralfate (CARAFATE) 1 g tablet Take 1 tablet (1 g total) by mouth 2 (two) times daily. 10/14/17   Einar Pheasant, MD  triamterene-hydrochlorothiazide (MAXZIDE-25) 37.5-25 MG tablet Take 0.5 tablets by mouth daily. 06/16/20   Einar Pheasant, MD    Allergies Patient has no known allergies.  Family History  Problem Relation Age of Onset  . Diabetes Mother   . Multiple myeloma Mother   . Diabetes Brother   . Diabetes Sister        x2  . Hypertension Brother   . Multiple myeloma Father      Social History Social History   Tobacco Use  . Smoking status: Never Smoker  . Smokeless tobacco: Never Used  Vaping Use  . Vaping Use: Never used  Substance Use Topics  . Alcohol use: Yes    Alcohol/week: 0.0 standard drinks    Comment: wine occasionally  . Drug use: No    Review of Systems  Constitutional: No fever/chills Eyes: No visual changes. ENT: No sore throat. Cardiovascular: Denies chest pain. Respiratory: Denies shortness of breath. Gastrointestinal: No abdominal pain.  No nausea, no vomiting.  No diarrhea.  No constipation. Genitourinary: Negative for dysuria. Musculoskeletal: Negative for back pain. Skin: Negative for rash. Neurological: Negative for focal weakness   ____________________________________________   PHYSICAL EXAM:  VITAL SIGNS: ED Triage Vitals  Enc  Vitals Group     BP 08/11/20 1901 (!) 193/74     Pulse Rate 08/11/20 1901 82     Resp 08/11/20 1901 16     Temp 08/11/20 1901 99.2 F (37.3 C)     Temp Source 08/11/20 1901 Oral     SpO2 08/11/20 1901 97 %     Weight 08/11/20 1902 142 lb (64.4 kg)     Height 08/11/20 1902 5' (1.524 m)     Head Circumference --      Peak Flow --      Pain Score 08/11/20 1902 6     Pain Loc --      Pain Edu? --      Excl. in [place]? --    Constitutional: Alert and oriented. Well appearing and in no acute distress. Eyes: Conjunctivae are normal. PER. EOMI. fundi look normal Head: Atraumatic. Nose: No congestion/rhinnorhea. Mouth/Throat: Mucous membranes are moist.  Oropharynx non-erythematous. Neck: No stridor.  Cardiovascular: Normal rate, regular rhythm. Grossly normal heart sounds.  Good peripheral circulation. Respiratory: Normal respiratory effort.  No retractions. Lungs CTAB. Gastrointestinal: Soft and nontender. No distention. No abdominal bruits.  Musculoskeletal: No lower extremity tenderness nor edema.   Neurologic:  Normal speech and language. No gross focal neurologic deficits are  appreciated. No gait instability. Skin:  Skin is warm, dry and intact. No rash noted.   ____________________________________________   LABS (all labs ordered are listed, but only abnormal results are displayed)  Labs Reviewed  BASIC METABOLIC PANEL - Abnormal; Notable for the following components:      Result Value   Glucose, Bld 122 (*)    All other components within normal limits  CBC  TROPONIN I (HIGH SENSITIVITY)   ____________________________________________  EKG EKG read interpreted by me shows normal sinus rhythm rate of 82 left axis poor R wave progression but no acute ST-T wave changes  ____________________________________________  RADIOLOGY  ED MD interpretation: CT of the head and chest x-ray read by radiology and reviewed by me do not show any acute changes  Official radiology report(s): DG Chest 2 View  Result Date: 08/11/2020 CLINICAL DATA:  74 year old female with cough and shortness of breath. EXAM: CHEST - 2 VIEW COMPARISON:  Chest radiograph dated 10/30/2018. FINDINGS: There is no focal consolidation, pleural effusion, or pneumothorax. Stable cardiac silhouette. There is a moderate size hiatal hernia. No acute osseous pathology. Several surgical clips noted in the right anterior chest wall. IMPRESSION: 1. No active cardiopulmonary disease. 2. Hiatal hernia. Electronically Signed   By: Anner Crete [name]   On: 08/11/2020 19:38   CT Head Wo Contrast  Result Date: 08/11/2020 CLINICAL DATA:  Neuro deficit, acute, stroke suspected Headache, dizziness and nausea. EXAM: CT HEAD WITHOUT CONTRAST TECHNIQUE: Contiguous axial images were obtained from the base of the skull through the vertex without intravenous contrast. COMPARISON:  None. FINDINGS: Brain: Brain volume is normal for age. No intracranial hemorrhage, mass effect, or midline shift. No hydrocephalus. The basilar cisterns are patent. No evidence of territorial infarct or acute ischemia. No extra-axial or  intracranial fluid collection. Vascular: Atherosclerosis of skullbase vasculature without hyperdense vessel or abnormal calcification. Skull: No fracture or focal lesion. Sinuses/Orbits: Paranasal sinuses and mastoid air cells are clear. The visualized orbits are unremarkable. Incidental small osteoma in the anterior left ethmoid sinus. Other: None. IMPRESSION: Unremarkable noncontrast head CT for age. Electronically Signed   By: Keith Rake [name]   On: 08/11/2020 19:48    ____________________________________________   PROCEDURES  Procedure(s) performed (including Critical Care):  Procedures   ____________________________________________   INITIAL IMPRESSION / ASSESSMENT AND PLAN / ED COURSE  Patient currently symptom-free.  Symptoms are more lightheaded than vertiginous.  I will have her follow-up with her regular doctor.  Perhaps her new CPAP machine records data.  Perhaps her doctor can review the data or send her to pulmonary for that purpose.  The symptoms seem to have come on about the same time the machine was changed perhaps its not working quite as well.  Otherwise I will have her return here if she has a return of symptoms.  If there is at that time some evidence of vertigo we could do an MRI.  Patient's blood pressure is currently good.              ____________________________________________   FINAL CLINICAL IMPRESSION(S) / ED DIAGNOSES  Final diagnoses:  Lightheadedness     ED Discharge Orders    None       Note:  This document was prepared using Dragon voice recognition software and may include unintentional dictation errors.    Nena Polio, MD 08/12/20 1030

## 2020-08-14 ENCOUNTER — Encounter: Payer: Self-pay | Admitting: Internal Medicine

## 2020-08-14 ENCOUNTER — Other Ambulatory Visit: Payer: Self-pay | Admitting: Internal Medicine

## 2020-08-15 NOTE — Telephone Encounter (Signed)
Spoke with patient to confirm no new acute symptoms. Gave her info to [facility] and advised if any new acute or worsening persistent symptoms, she does not need to wait through the weekend to be evaluated. Pt agreed and advised she would be evaluated if needed. Has f/u on Tuesday.

## 2020-08-17 ENCOUNTER — Encounter: Payer: Self-pay | Admitting: Emergency Medicine

## 2020-08-17 ENCOUNTER — Ambulatory Visit
Admission: EM | Admit: 2020-08-17 | Discharge: 2020-08-17 | Disposition: A | Discharge status: Home / Self Care | Payer: Medicare HMO | Attending: Family Medicine | Admitting: Family Medicine

## 2020-08-17 ENCOUNTER — Other Ambulatory Visit: Payer: Self-pay

## 2020-08-17 DIAGNOSIS — R519 Headache, unspecified: Secondary | ICD-10-CM | POA: Diagnosis not present

## 2020-08-17 MED ORDER — AMITRIPTYLINE HCL 10 MG PO TABS: 10.0000 mg | ORAL_TABLET | Freq: Every day | ORAL | 0 refills | Status: DC

## 2020-08-17 NOTE — ED Provider Notes (Signed)
[facility]    CSN: [id number] Arrival date & time: 08/17/20  1153  History   Chief Complaint Chief Complaint  Patient presents with   Headache   Dizziness   HPI  74 year old female presents with persistent headache.  Patient reports recent episodes of dizziness.  She was recently seen in the ER on 8/24.  She had normal labs and a negative CT scan.  Patient reports that since her ER visit she has had frequent headaches.  Have now become constant.  She states that her pain is in various locations.  Currently she is having pain in the left parietal region.  She denies any associated nausea.  She does report a change in appetite.  Pain 5/10 in severity.  She has taken Tylenol without relief.  Patient has reached out to her primary care provider and has follow-up on Tuesday.  She has had some frontal pain which makes her concern for sinusitis.  She has no respiratory symptoms.  No other reported symptoms.  No other complaints.  Past Medical History:  Diagnosis Date   Abdominal hernia    Anxiety    situational stress and anxiety   Cancer ([place]) 2015   breast    Cataract cortical, senile, unspecified laterality    Chest pain    Exertional dyspnea    GERD (gastroesophageal reflux disease)    Hiatal hernia    Hiatal hernia    Hypercholesterolemia    Hyperlipidemia    Hypertension    Osteoarthritis    scoliosis, degenerative disc dz, knee, carpal tunnel   Sleep apnea     Patient Active Problem List   Diagnosis Date Noted   Stress 04/20/2020   Decreased GFR 03/11/2020   Leg pain 11/04/2019   Osteopenia 11/04/2018   Rectal bleeding 06/27/2018   Joint pain 06/08/2016   Sleeping difficulty 04/06/2015   SOB (shortness of breath) 02/02/2015   Health care maintenance 02/02/2015   Paraesophageal hiatal hernia 01/21/2015   Esophageal reflux 01/21/2015   Hernia of abdominal cavity 10/22/2014   Back pain 10/13/2014   Hyperglycemia 10/08/2014    Abdominal hernia 10/08/2014   Breast cancer ([place]) 07/09/2014   Knee pain, right 07/09/2014   Obstructive sleep apnea 12/23/2013   Chest pain 10/01/2013   Hypertension 10/25/2012   GERD (gastroesophageal reflux disease) 10/25/2012   Hypercholesterolemia 10/25/2012   Allergic rhinitis 10/25/2012    Past Surgical History:  Procedure Laterality Date   ABDOMINAL HYSTERECTOMY  07/2000   APPENDECTOMY  2000   APPENDECTOMY     BREAST BIOPSY Bilateral 2014   BREAST SURGERY Right 2015   lumpectomy   CARPAL TUNNEL RELEASE     COLONOSCOPY  2011   [name], [name]: Normal exam   COLONOSCOPY WITH PROPOFOL N/A 04/03/2020   Procedure: COLONOSCOPY WITH PROPOFOL;  Surgeon: Toledo, Benay Pike, MD;  Location: [facility];  Service: Gastroenterology;  Laterality: N/A;   ESOPHAGOGASTRODUODENOSCOPY (EGD) WITH PROPOFOL N/A 04/03/2020   Procedure: ESOPHAGOGASTRODUODENOSCOPY (EGD) WITH PROPOFOL;  Surgeon: Toledo, Benay Pike, MD;  Location: [facility];  Service: Gastroenterology;  Laterality: N/A;   HERNIA REPAIR  02/10/15   Laparoscopic Repair of Lateral Abdominal Wall Hernia  02/10/15   MASTECTOMY     UPPER GI ENDOSCOPY  05/10/2014   [name], [name] Bile gastritis, medium hiatal hernia. Incomplete visualization of the cardia and fundus.    OB History    Gravida  2   Para      Term      Preterm  AB      Living  2     SAB      TAB      Ectopic      Multiple      Live Births           Obstetric Comments  1st Menstrual Cycle:  10 1st Pregnancy:  20           Home Medications    Prior to Admission medications   Medication Sig Start Date End Date Taking? Authorizing Provider  amitriptyline (ELAVIL) 10 MG tablet Take 1 tablet (10 mg total) by mouth at bedtime. 08/17/20   Coral Spikes, DO  anastrozole (ARIMIDEX) 1 MG tablet TAKE 1 TABLET BY MOUTH ONCE DAILY 01/31/17   [provider]  aspirin 81 MG tablet Take 81 mg by mouth  daily.    [provider]  azelastine (ASTELIN) 0.1 % nasal spray USE 2 SPRAYS IN BOTH  NOSTRILS TWICE DAILY AS  DIRECTED 04/14/20   Einar Pheasant, MD  Calcium Carbonate-Vitamin D (CALCIUM 500/VITAMIN D PO)     [provider]  Calcium Carbonate-Vitamin D (CALCIUM 600 + D PO) Take 600 mg by mouth 2 (two) times daily.    [provider]  cholecalciferol (VITAMIN D) 1000 UNITS tablet Take 2,000 Units by mouth daily.     [provider]  CINNAMON PO Take by mouth 2 (two) times daily.    [provider]  fluticasone (FLONASE) 50 MCG/ACT nasal spray Place 2 sprays into both nostrils daily. 04/17/20   Einar Pheasant, MD  gabapentin (NEURONTIN) 100 MG capsule Take 2 capsules (200 mg total) by mouth at bedtime. 03/06/20   Einar Pheasant, MD  irbesartan (AVAPRO) 75 MG tablet TAKE 1 TABLET EVERY DAY 08/15/20   Einar Pheasant, MD  methocarbamol (ROBAXIN) 500 MG tablet Take 1 tablet (500 mg total) by mouth at bedtime as needed for muscle spasms. 11/01/19   Einar Pheasant, MD  Misc Natural Products [facility] ADVANCED) CAPS Take 1 capsule by mouth 2 (two) times daily. Reported on 03/04/2016    [provider]  MULTIPLE MINERALS-VITAMINS PO     [provider]  Multiple Vitamin (MULTIVITAMIN) tablet Take 1 tablet by mouth daily.    [provider]  PEPPERMINT OIL PO Take by mouth.    [provider]  RABEprazole (ACIPHEX) 20 MG tablet Take 1 tablet (20 mg total) by mouth 2 (two) times daily. 10/14/17   Einar Pheasant, MD  rosuvastatin (CRESTOR) 5 MG tablet TAKE 1 TABLET BY MOUTH  DAILY 08/28/19   Einar Pheasant, MD  sucralfate (CARAFATE) 1 g tablet Take 1 tablet (1 g total) by mouth 2 (two) times daily. 10/14/17   Einar Pheasant, MD  triamterene-hydrochlorothiazide (MAXZIDE-25) 37.5-25 MG tablet Take 0.5 tablets by mouth daily. 06/16/20   Einar Pheasant, MD    Family History Family History  Problem Relation Age of Onset    Diabetes Mother    Multiple myeloma Mother    Diabetes Brother    Diabetes Sister        x2   Hypertension Brother    Multiple myeloma Father     Social History Social History   Tobacco Use   Smoking status: Never Smoker   Smokeless tobacco: Never Used  Scientific laboratory technician Use: Never used  Substance Use Topics   Alcohol use: Yes    Alcohol/week: 0.0 standard drinks    Comment: wine occasionally   Drug use:  No     Allergies   Patient has no known allergies.   Review of Systems Review of Systems  Constitutional: Negative.   Neurological: Positive for dizziness, light-headedness and headaches.   Physical Exam Triage Vital Signs ED Triage Vitals  Enc Vitals Group     BP 08/17/20 1234 (!) 149/67     Pulse Rate 08/17/20 1234 83     Resp 08/17/20 1234 14     Temp 08/17/20 1234 98.3 F (36.8 C)     Temp Source 08/17/20 1234 Oral     SpO2 08/17/20 1234 97 %     Weight 08/17/20 1231 142 lb (64.4 kg)     Height 08/17/20 1231 5' (1.524 m)     Head Circumference --      Peak Flow --      Pain Score 08/17/20 1231 5     Pain Loc --      Pain Edu? --      Excl. in [place]? --    Updated Vital Signs BP (!) 149/67 (BP Location: Left Arm)    Pulse 83    Temp 98.3 F (36.8 C) (Oral)    Resp 14    Ht 5' (1.524 m)    Wt 64.4 kg    SpO2 97%    BMI 27.73 kg/m   Visual Acuity Right Eye Distance:   Left Eye Distance:   Bilateral Distance:    Right Eye Near:   Left Eye Near:    Bilateral Near:     Physical Exam Vitals and nursing note reviewed.  Constitutional:      General: She is not in acute distress.    Appearance: Normal appearance. She is not ill-appearing.  HENT:     Head: Normocephalic and atraumatic.     Nose: Nose normal.  Eyes:     General:        Right eye: No discharge.        Left eye: No discharge.     Conjunctiva/sclera: Conjunctivae normal.  Cardiovascular:     Rate and Rhythm: Normal rate and regular rhythm.  Pulmonary:     Effort:  Pulmonary effort is normal.     Breath sounds: Normal breath sounds. No wheezing, rhonchi or rales.  Neurological:     General: No focal deficit present.     Mental Status: She is alert and oriented to person, place, and time.     Cranial Nerves: No cranial nerve deficit.     Motor: No weakness.  Psychiatric:        Mood and Affect: Mood normal.        Behavior: Behavior normal.    UC Treatments / Results  Labs (all labs ordered are listed, but only abnormal results are displayed) Labs Reviewed - No data to display  EKG   Radiology No results found.  Procedures Procedures (including critical care time)  Medications Ordered in UC Medications - No data to display  Initial Impression / Assessment and Plan / UC Course  I have reviewed the triage vital signs and the nursing notes.  Pertinent labs & imaging results that were available during my care of the patient were reviewed by me and considered in my medical decision making (see chart for details).    74 year old female presents with persistent headaches.  She has had recent laboratory studies and CT which were negative.  Trial of Elavil at night.  Follow-up with PCP.  Final Clinical Impressions(s) / UC Diagnoses  Final diagnoses:  Acute nonintractable headache, unspecified headache type     Discharge Instructions     Rest.  Medication as prescribed.  Follow up with Dr. Nicki Reaper.  Take care  Dr. Lacinda Axon    ED Prescriptions    Medication Sig Dispense Auth. Provider   amitriptyline (ELAVIL) 10 MG tablet Take 1 tablet (10 mg total) by mouth at bedtime. 30 tablet Coral Spikes, DO     PDMP not reviewed this encounter.   Coral Spikes, [place] 08/17/20 1656

## 2020-08-17 NOTE — Discharge Instructions (Signed)
Rest.  Medication as prescribed.  Follow up with Dr. Nicki Reaper.  Take care  Dr. Lacinda Axon

## 2020-08-17 NOTE — ED Triage Notes (Signed)
Patient reports 2 episodes of dizziness and nausea that last about 4 hours.  Patient states that she was seen at [facility] on Monday for these symptoms and had several tests done there.  Patient reports ongoing HAs since Monday.  Patient wonders if she has a sinus infection.  Patient denies fevers.

## 2020-08-19 ENCOUNTER — Telehealth (INDEPENDENT_AMBULATORY_CARE_PROVIDER_SITE_OTHER): Payer: Medicare HMO | Admitting: Internal Medicine

## 2020-08-19 DIAGNOSIS — E7800 Pure hypercholesterolemia, unspecified: Secondary | ICD-10-CM | POA: Diagnosis not present

## 2020-08-19 DIAGNOSIS — F439 Reaction to severe stress, unspecified: Secondary | ICD-10-CM | POA: Diagnosis not present

## 2020-08-19 DIAGNOSIS — G479 Sleep disorder, unspecified: Secondary | ICD-10-CM | POA: Diagnosis not present

## 2020-08-19 DIAGNOSIS — G4733 Obstructive sleep apnea (adult) (pediatric): Secondary | ICD-10-CM

## 2020-08-19 DIAGNOSIS — R42 Dizziness and giddiness: Secondary | ICD-10-CM | POA: Diagnosis not present

## 2020-08-19 DIAGNOSIS — R519 Headache, unspecified: Secondary | ICD-10-CM

## 2020-08-19 DIAGNOSIS — R079 Chest pain, unspecified: Secondary | ICD-10-CM | POA: Diagnosis not present

## 2020-08-19 DIAGNOSIS — I10 Essential (primary) hypertension: Secondary | ICD-10-CM

## 2020-08-19 NOTE — Progress Notes (Signed)
Patient ID: Carla Ryan, female   DOB: 08/22/1946, 74 y.o.   MRN: [id number]   Virtual Visit via video Note  This visit type was conducted due to national recommendations for restrictions regarding the COVID-19 pandemic (e.g. social distancing).  This format is felt to be most appropriate for this patient at this time.  All issues noted in this document were discussed and addressed.  No physical exam was performed (except for noted visual exam findings with Video Visits).   I connected with [name] by a video enabled telemedicine application and verified that I am speaking with the correct person using two identifiers. Location patient: home Location provider: work Persons participating in the virtual visit: patient, provider  The limitations, risks, security and privacy concerns of performing an evaluation and management service by video and the availability of in person appointments have been discussed.  It has also been discussed with the patient that there may be a patient responsible charge related to this service. The patient expressed understanding and agreed to proceed.   Reason for visit: work in appt.   HPI: Work in appt for ER follow up.  She was initially seen in ER 08/12/20 for dizziness.  Per note, felt like symptoms started when she got her new cpap machine.  Saw ENT prior to ER visit - for dizziness and tinnitus. CT head and cxr - unrevealing.  She was reevaluated 08/17/20 in urgent care for headaches.  Was placed on amitriptyline.  She reports that she has felt a little better over the last couple of days. Had not been sleeping well.  Slept a little better last night.  in reviewing, she states that three weeks ago, she had "dizzy episode".  Seems to have had some intermittent episodes since.  Per pt, had "maneuver test" at ENT.  Was not having symptoms at that time and states test unrevealing.  Do not have records.  She does have a new cpap.  Was questioning if symptoms of  her headache could be related.  Not a constant headache.  Did notice over the last several days, some increased drainage and scratchy throat.  Took tylenol.  Has not been sleeping as well.  Intermittent headache.  Varied locations.  She was questioning if related to increased stress.  Denies any loss of taste or smell.  No chest congestion or sob.  Noticed a little chest discomfort this am, but relates to increased stress.  No pain or tightness or sob now.  No vomiting.      ROS: See pertinent positives and negatives per HPI.  Past Medical History:  Diagnosis Date  . Abdominal hernia   . Anxiety    situational stress and anxiety  . Cancer ([place]) 2015   breast   . Cataract cortical, senile, unspecified laterality   . Chest pain   . Exertional dyspnea   . GERD (gastroesophageal reflux disease)   . Hiatal hernia   . Hiatal hernia   . Hypercholesterolemia   . Hyperlipidemia   . Hypertension   . Osteoarthritis    scoliosis, degenerative disc dz, knee, carpal tunnel  . Sleep apnea     Past Surgical History:  Procedure Laterality Date  . ABDOMINAL HYSTERECTOMY  07/2000  . APPENDECTOMY  2000  . APPENDECTOMY    . BREAST BIOPSY Bilateral 2014  . BREAST SURGERY Right 2015   lumpectomy  . CARPAL TUNNEL RELEASE    . COLONOSCOPY  2011   [name], [name]: Normal exam  .  COLONOSCOPY WITH PROPOFOL N/A 04/03/2020   Procedure: COLONOSCOPY WITH PROPOFOL;  Surgeon: Toledo, Benay Pike, MD;  Location: [facility];  Service: Gastroenterology;  Laterality: N/A;  . ESOPHAGOGASTRODUODENOSCOPY (EGD) WITH PROPOFOL N/A 04/03/2020   Procedure: ESOPHAGOGASTRODUODENOSCOPY (EGD) WITH PROPOFOL;  Surgeon: Toledo, Benay Pike, MD;  Location: [facility];  Service: Gastroenterology;  Laterality: N/A;  . HERNIA REPAIR  02/10/15  . Laparoscopic Repair of Lateral Abdominal Wall Hernia  02/10/15  . MASTECTOMY    . UPPER GI ENDOSCOPY  05/10/2014   [name], [name] Bile gastritis, medium hiatal hernia.  Incomplete visualization of the cardia and fundus.    Family History  Problem Relation Age of Onset  . Diabetes Mother   . Multiple myeloma Mother   . Diabetes Brother   . Diabetes Sister        x2  . Hypertension Brother   . Multiple myeloma Father     SOCIAL HX: reviewed.    Current Outpatient Medications:  .  amitriptyline (ELAVIL) 10 MG tablet, Take 1 tablet (10 mg total) by mouth at bedtime., Disp: 30 tablet, Rfl: 0 .  anastrozole (ARIMIDEX) 1 MG tablet, TAKE 1 TABLET BY MOUTH ONCE DAILY, Disp: , Rfl:  .  aspirin 81 MG tablet, Take 81 mg by mouth daily., Disp: , Rfl:  .  azelastine (ASTELIN) 0.1 % nasal spray, USE 2 SPRAYS IN BOTH  NOSTRILS TWICE DAILY AS  DIRECTED, Disp: 120 mL, Rfl: 3 .  Calcium Carbonate-Vitamin D (CALCIUM 500/VITAMIN D PO), , Disp: , Rfl:  .  Calcium Carbonate-Vitamin D (CALCIUM 600 + D PO), Take 600 mg by mouth 2 (two) times daily., Disp: , Rfl:  .  cholecalciferol (VITAMIN D) 1000 UNITS tablet, Take 2,000 Units by mouth daily. , Disp: , Rfl:  .  CINNAMON PO, Take by mouth 2 (two) times daily., Disp: , Rfl:  .  fluticasone (FLONASE) 50 MCG/ACT nasal spray, PLACE 2 SPRAYS INTO BOTH NOSTRILS DAILY., Disp: 48 g, Rfl: 1 .  gabapentin (NEURONTIN) 100 MG capsule, Take 2 capsules (200 mg total) by mouth at bedtime., Disp: 180 capsule, Rfl: 3 .  irbesartan (AVAPRO) 75 MG tablet, TAKE 1 TABLET EVERY DAY, Disp: 90 tablet, Rfl: 3 .  methocarbamol (ROBAXIN) 500 MG tablet, Take 1 tablet (500 mg total) by mouth at bedtime as needed for muscle spasms., Disp: 30 tablet, Rfl: 0 .  Misc Natural Products ([facility] [facility] ADVANCED) CAPS, Take 1 capsule by mouth 2 (two) times daily. Reported on 03/04/2016, Disp: , Rfl:  .  MULTIPLE MINERALS-VITAMINS PO, , Disp: , Rfl:  .  Multiple Vitamin (MULTIVITAMIN) tablet, Take 1 tablet by mouth daily., Disp: , Rfl:  .  PEPPERMINT OIL PO, Take by mouth., Disp: , Rfl:  .  RABEprazole (ACIPHEX) 20 MG tablet, Take 1 tablet (20 mg total) by mouth  2 (two) times daily., Disp: 180 tablet, Rfl: 1 .  rosuvastatin (CRESTOR) 5 MG tablet, TAKE 1 TABLET BY MOUTH  DAILY, Disp: 90 tablet, Rfl: 3 .  sucralfate (CARAFATE) 1 g tablet, Take 1 tablet (1 g total) by mouth 2 (two) times daily., Disp: 180 tablet, Rfl: 1 .  triamterene-hydrochlorothiazide (MAXZIDE-25) 37.5-25 MG tablet, Take 0.5 tablets by mouth daily., Disp: 45 tablet, Rfl: 1  EXAM:  GENERAL: alert, oriented, appears well and in no acute distress  HEENT: atraumatic, conjunttiva clear, no obvious abnormalities on inspection of external nose and ears  NECK: normal movements of the head and neck  LUNGS: on inspection no signs of respiratory  distress, breathing rate appears normal, no obvious gross SOB, gasping or wheezing  CV: no obvious cyanosis  PSYCH/NEURO: pleasant and cooperative, no obvious depression or anxiety, speech and thought processing grossly intact  ASSESSMENT AND PLAN:  Discussed the following assessment and plan:  Stress Increased stress as outlined.  Discussed with her today.  She was questioning if some of her symptoms related to increased stress.  Just started on amitriptyline.  Slept some better last night.  Feels better over the last couple of days.  Will continue amitriptyline for now.  See if can get her sleeping better.  See if this improves symptoms.  Follow.   Sleeping difficulty Not sleeping well recently as outlined.  New cpap.  Increased stress.  Just started on amitriptyline.  Slept some better last night.  Just started.  Will continue.  Follow.    Obstructive sleep apnea New machine.  Will need to confirm doing well with new device.   Hypertension On avapro.  Triam/hctz - not taking regularly.  Elevated pressure recently in ER.  Have her spot check her pressure.  Send in readings.  May need to adjust dose of avapro.   Hypercholesterolemia On crestor.  Follow lipid panel and liver function tests.   Chest pain Previously saw cardiology. Stress  test negative for ischemia.  She felt the episode she previously experienced was related to increased stress. No pain currently.  Follow.    Headache Recently noticed.  Intermittent.  No known triggers.  She was questioning her new cpap, increased stress, not sleeping well, etc.  She is having some increased drainage and scratchy throat.  covid test pending.  Did sleep better last night.  Recently started on amitriptyline as outlined.  Will continue for now.  See if headache improves with better sleep.  Use astelin, flonase and saline nasal spray as outlined.  Follow closely.  Recent CT head unrevealing.   Dizziness Intermittent.  Saw ENT.  W/up per pt unrevealing.  Obtain records.  Continue astelin, flonase and saline nasal spray.  See if can get her sleeping better.  Follow.      I discussed the assessment and treatment plan with the patient. The patient was provided an opportunity to ask questions and all were answered. The patient agreed with the plan and demonstrated an understanding of the instructions.   The patient was advised to call back or seek an in-person evaluation if the symptoms worsen or if the condition fails to improve as anticipated.   Einar Pheasant, MD

## 2020-08-20 ENCOUNTER — Encounter: Payer: Self-pay | Admitting: Internal Medicine

## 2020-08-20 ENCOUNTER — Other Ambulatory Visit: Payer: Self-pay | Admitting: Internal Medicine

## 2020-08-20 NOTE — Telephone Encounter (Signed)
Reviewed.  covid test is negative.  Please call pt and confirm doing ok.  How is she feeling now?  Any acute change or worsening symptoms, needs to be seen.

## 2020-08-21 ENCOUNTER — Encounter: Payer: Self-pay | Admitting: Internal Medicine

## 2020-08-21 NOTE — Telephone Encounter (Signed)
Patient aware and stated that she is still having headaches and may go back over to [facility] urgent care. Advised that Dr Nicki Reaper and I agree since still having acute symptoms. Will update tomorrow.

## 2020-08-21 NOTE — Telephone Encounter (Signed)
See other message. Sent update from today

## 2020-08-22 ENCOUNTER — Encounter: Payer: Self-pay | Admitting: Internal Medicine

## 2020-08-22 DIAGNOSIS — R519 Headache, unspecified: Secondary | ICD-10-CM | POA: Insufficient documentation

## 2020-08-22 DIAGNOSIS — R42 Dizziness and giddiness: Secondary | ICD-10-CM | POA: Insufficient documentation

## 2020-08-22 NOTE — Telephone Encounter (Signed)
See other my chart message for blood pressure results and response. Given persistent increased headache, I agree with reevaluation.

## 2020-08-22 NOTE — Telephone Encounter (Signed)
Patient is aware 

## 2020-08-22 NOTE — Assessment & Plan Note (Signed)
On crestor.  Follow lipid panel and liver function tests.   

## 2020-08-22 NOTE — Assessment & Plan Note (Signed)
Recently noticed.  Intermittent.  No known triggers.  She was questioning her new cpap, increased stress, not sleeping well, etc.  She is having some increased drainage and scratchy throat.  covid test pending.  Did sleep better last night.  Recently started on amitriptyline as outlined.  Will continue for now.  See if headache improves with better sleep.  Use astelin, flonase and saline nasal spray as outlined.  Follow closely.  Recent CT head unrevealing.

## 2020-08-22 NOTE — Assessment & Plan Note (Signed)
Increased stress as outlined.  Discussed with her today.  She was questioning if some of her symptoms related to increased stress.  Just started on amitriptyline.  Slept some better last night.  Feels better over the last couple of days.  Will continue amitriptyline for now.  See if can get her sleeping better.  See if this improves symptoms.  Follow.

## 2020-08-22 NOTE — Assessment & Plan Note (Signed)
New machine.  Will need to confirm doing well with new device.

## 2020-08-22 NOTE — Assessment & Plan Note (Signed)
Intermittent.  Saw ENT.  W/up per pt unrevealing.  Obtain records.  Continue astelin, flonase and saline nasal spray.  See if can get her sleeping better.  Follow.

## 2020-08-22 NOTE — Assessment & Plan Note (Signed)
On avapro.  Triam/hctz - not taking regularly.  Elevated pressure recently in ER.  Have her spot check her pressure.  Send in readings.  May need to adjust dose of avapro.

## 2020-08-22 NOTE — Telephone Encounter (Signed)
Most recent phone message reviewed.  She recently saw ENT for dizziness. Need records asap.  Also, confirm pt still taking amitriptyline.  Is she sleeping better?  Still with dizziness?  Also, need to know how her blood pressures are doing?  Was elevated in ER.

## 2020-08-22 NOTE — Assessment & Plan Note (Signed)
Previously saw cardiology. Stress test negative for ischemia.  She felt the episode she previously experienced was related to increased stress. No pain currently.  Follow.

## 2020-08-22 NOTE — Assessment & Plan Note (Signed)
Not sleeping well recently as outlined.  New cpap.  Increased stress.  Just started on amitriptyline.  Slept some better last night.  Just started.  Will continue.  Follow.

## 2020-08-22 NOTE — Telephone Encounter (Signed)
Patient stated no dizziness since going to the ED. She is taking the amitriptyline- seems to be helping some. Sleep is a little better. Stated she did not have a good night last night. Headaches are worse when laying down. Spent most of the night sitting in recliner. Does not have headache right now. She has not checked her bp yet because she just woke up but the readings in this message are the last 2 days. I have requested records from [facility] E[facility]. She is going to check her bp when she gets up and send in reading through my chart.

## 2020-08-22 NOTE — Telephone Encounter (Signed)
Given blood pressure varying (with systolic of 343), will hold on increasing blood pressure medication.  Make sure eating.  If persistent increased headache, will need to be reevaluated.

## 2020-08-23 ENCOUNTER — Telehealth: Payer: Self-pay | Admitting: Internal Medicine

## 2020-08-23 DIAGNOSIS — G4730 Sleep apnea, unspecified: Secondary | ICD-10-CM

## 2020-08-23 DIAGNOSIS — R42 Dizziness and giddiness: Secondary | ICD-10-CM

## 2020-08-23 DIAGNOSIS — R519 Headache, unspecified: Secondary | ICD-10-CM

## 2020-08-23 NOTE — Telephone Encounter (Signed)
Called pt.  She has felt better yesterday and today.  She does not have headaches throughout the day.  Is now only experiencing headaches with cpap usage.  She has not worn her cpap as much the last couple of days and has not had the severe headache.  Discussed avoiding sedating medication.  Discussed avoiding sleeping supine, until we can get this issue sorted through.  Referral placed for pulmonary evaluation for home sleep evaluation.  No dizziness now, but did have the intermittent dizziness note previously.  Discussed with neurology.  Given above (headahces now appearing to be associated with cpap usage), will hold on prednisone therapy.  Agreeable to referral to neurology.  Orders for referrals placed.

## 2020-08-27 ENCOUNTER — Other Ambulatory Visit: Payer: Self-pay

## 2020-08-27 ENCOUNTER — Other Ambulatory Visit (INDEPENDENT_AMBULATORY_CARE_PROVIDER_SITE_OTHER): Payer: Medicare HMO

## 2020-08-27 DIAGNOSIS — R944 Abnormal results of kidney function studies: Secondary | ICD-10-CM | POA: Diagnosis not present

## 2020-08-27 LAB — BASIC METABOLIC PANEL
BUN: 13 mg/dL (ref 6–23)
CO2: 28 mEq/L (ref 19–32)
Calcium: 10 mg/dL (ref 8.4–10.5)
Chloride: 96 mEq/L (ref 96–112)
Creatinine, Ser: 0.89 mg/dL (ref 0.40–1.20)
GFR: 61.57 mL/min (ref >60.00)
Glucose, Bld: 98 mg/dL (ref 70–99)
Potassium: 4 mEq/L (ref 3.5–5.1)
Sodium: 131 mEq/L — ABNORMAL LOW (ref 135–145)

## 2020-08-28 ENCOUNTER — Other Ambulatory Visit: Payer: Self-pay | Admitting: Internal Medicine

## 2020-08-28 ENCOUNTER — Encounter: Payer: Self-pay | Admitting: Pulmonary Disease

## 2020-08-28 ENCOUNTER — Ambulatory Visit (INDEPENDENT_AMBULATORY_CARE_PROVIDER_SITE_OTHER): Payer: Medicare HMO | Admitting: Pulmonary Disease

## 2020-08-28 VITALS — BP 148/74 | HR 80 | Temp 97.3°F | Ht 60.0 in | Wt 138.8 lb

## 2020-08-28 DIAGNOSIS — G4733 Obstructive sleep apnea (adult) (pediatric): Secondary | ICD-10-CM | POA: Diagnosis not present

## 2020-08-28 DIAGNOSIS — Z9989 Dependence on other enabling machines and devices: Secondary | ICD-10-CM

## 2020-08-28 DIAGNOSIS — E871 Hypo-osmolality and hyponatremia: Secondary | ICD-10-CM

## 2020-08-28 NOTE — Progress Notes (Signed)
Order placed for f/u sodium.  ?

## 2020-08-28 NOTE — Patient Instructions (Signed)
Will have Kelly at [facility] Patient change your CPAP to 9 cm water and increase temperature for your CPAP humidifier  Try using azelastine twice per day  Try using xyzal around dinner time  Can look up CPAP mask options at [email or web address].[email or web address] or similar website  Follow up in 6 weeks with Dr. Halford Chessman or Nurse Practitioner

## 2020-08-28 NOTE — Progress Notes (Signed)
[facility]  Chief Complaint  Patient presents with  . Consult    Patient has sleep apnea and has been wearing CPAP since 2014. Has been having headaches for the last couple weeks that are worse at night and get better during the day and some episodes of dizziness.     Constitutional:  BP (!) 148/74 (BP Location: Left Arm, Patient Position: Sitting, Cuff Size: Normal)   Pulse 80   Temp (!) 97.3 F (36.3 C) (Temporal)   Ht 5' (1.524 m)   Wt 138 lb 12.8 oz (63 kg)   SpO2 95%   BMI 27.11 kg/m   Past Medical History:  HTN, HLD, Allergic rhinitis, Anxiety, OA, Scoliosis, Carpal tunnel, Rt Breast cancer 2013, GERD, Hiatal hernia, Positive ANA  Past Surgical History:  Her  has a past surgical history that includes Appendectomy (2000); Abdominal hysterectomy (07/2000); Carpal tunnel release; Breast biopsy (Bilateral, 2014); Breast surgery (Right, 2015); Colonoscopy (2011); Upper gi endoscopy (05/10/2014); Hernia repair (02/10/15); Laparoscopic Repair of Lateral Abdominal Wall Hernia (02/10/15); Appendectomy; Mastectomy; Esophagogastroduodenoscopy (egd) with propofol (N/A, 04/03/2020); and Colonoscopy with propofol (N/A, 04/03/2020).  Brief Summary:  Carla Ryan is a 74 y.o. female with obstructive sleep apnea.      Subjective:  She had sleep study in 2014.  Showed severe obstructive sleep apnea.  She was seen by PCP recently for dizziness and trouble sleeping.  She recently received a new CPAP machine.  Getting headaches more frequently since starting new CPAP machine.  She had CT head on 08/11/20 that showed clear sinuses, and chest xray showed hiatal hernia.  She has been referred to pulmonary/sleep medicine for further assessment.  She uses [facility] for her DME.  She switched to a new mask.  She was using a full face mask, but now using nasal mask.  She has tried different sizes for her mask.  Her husband says that her mask leaks  some and she has to tighten the straps.  She feels the pressure might get too high sometimes.  She was advised to try decreasing humidifier temperature but this made her mouth terribly dry.  She was seen in the ER recently for dizziness and headache.  She has been feeling anxious.  Started on amitriptyline.  This has helped her sleep and anxiety, but has also contributed to mouth dryness.  She goes to bed at 1130 pm.  She falls asleep in 15 minutes.  She wakes up several times during the night due to back pain, and will sometimes shift from her bed to a recliner.  She gets out of bed at 7 am.  She is not using anything to help her stay awake.  Epworth score is 2 out of 24.  Physical Exam:   Appearance - well kempt   ENMT - no sinus tenderness, no oral exudate, no LAN, Mallampati *4 airway, no stridor  Respiratory - equal breath sounds bilaterally, no wheezing or rales  CV - s1s2 regular rate and rhythm, no murmurs  Ext - no clubbing, no edema  Skin - no rashes  Psych - normal mood and affect   Sleep Tests:   PSG 10/16/13 >> AHI 40.8, SpO2 low 74.4%  Auto CPAP 07/29/20 to 08/27/20 >> used on 25 of 30 nights with average 7 hrs 32 min.  Average AHI 1.1 with median CPAP 10 and 95 th percentile CPAP 11 cm H2O.  Air leak.  Social History:  She  reports  that she has never smoked. She has never used smokeless tobacco. She reports current alcohol use. She reports that she does not use drugs.  Family History:  Her family history includes Diabetes in her brother, mother, and sister; Hypertension in her brother; Multiple myeloma in her father and mother.    Discussion:  She has recent development of headaches since getting new CPAP machine and new CPAP mask.  She also reports increased symptoms related to allergic rhinitis.  Assessment/Plan:   Obstructive sleep apnea. - she is compliant with therapy - uses [facility] for her DME - will try changing her CPAP to fixed pressure  at 9 cm H2O to determine if this improves mask leak - discussed proper fit of her CPAP mask to see if this helps with mask leak and decrease tension from mask straps that could be contributing to her headaches - advised her to review CPAP mask options on-line, but she would prefer to continue with nasal mask for now - don't think she needs in lab titration study at this time  Allergic rhinitis. - advised her to try using azelastine bid - advised her to try using xyzal in place of allerga - continue [place] nasal spray - continue flonase  Dizziness, Headache. - she has neurology appointment scheduled for October 2021  Time Spent Involved in Patient Care on Day of Examination:  63 minutes  Follow up:  Patient Instructions  Will have Claiborne Billings at [facility] change your CPAP to 9 cm water and increase temperature for your CPAP humidifier  Try using azelastine twice per day  Try using xyzal around dinner time  Can look up CPAP mask options at [email or web address].[email or web address] or similar website  Follow up in 6 weeks with Dr. Halford Chessman or Nurse Practitioner   Medication List:   Allergies as of 08/28/2020   No Known Allergies     Medication List       Accurate as of August 28, 2020 11:05 AM. If you have any questions, ask your nurse or doctor.        STOP taking these medications   CALCIUM 500/VITAMIN D PO Stopped by: Chesley Mires, MD   CALCIUM 600 + D PO Stopped by: Chesley Mires, MD     TAKE these medications   amitriptyline 10 MG tablet Commonly known as: ELAVIL Take 1 tablet (10 mg total) by mouth at bedtime.   anastrozole 1 MG tablet Commonly known as: ARIMIDEX TAKE 1 TABLET BY MOUTH ONCE DAILY   aspirin 81 MG tablet Take 81 mg by mouth daily.   azelastine 0.1 % nasal spray Commonly known as: ASTELIN USE 2 SPRAYS IN BOTH  NOSTRILS TWICE DAILY AS  DIRECTED   calcium citrate-vitamin D 500-400 MG-UNIT chewable tablet Chew 1 tablet by mouth 2 (two) times daily.   cholecalciferol  1000 units tablet Commonly known as: VITAMIN D Take 2,000 Units by mouth in the morning and at bedtime.   CINNAMON PO Take by mouth 2 (two) times daily.   dextromethorphan-guaiFENesin 30-600 MG 12hr tablet Commonly known as: MUCINEX DM Take 1 tablet by mouth 2 (two) times daily as needed for cough.   fluticasone 50 MCG/ACT nasal spray Commonly known as: FLONASE PLACE 2 SPRAYS INTO BOTH NOSTRILS DAILY.   gabapentin 100 MG capsule Commonly known as: NEURONTIN Take 2 capsules (200 mg total) by mouth at bedtime.   irbesartan 75 MG tablet Commonly known as: AVAPRO TAKE 1 TABLET EVERY DAY   methocarbamol 500 MG tablet Commonly  known as: Robaxin Take 1 tablet (500 mg total) by mouth at bedtime as needed for muscle spasms.   MULTIPLE MINERALS-VITAMINS PO   multivitamin tablet Take 1 tablet by mouth daily.   PEPPERMINT OIL PO Take by mouth.   RABEprazole 20 MG tablet Commonly known as: ACIPHEX Take 1 tablet (20 mg total) by mouth 2 (two) times daily.   rosuvastatin 5 MG tablet Commonly known as: CRESTOR TAKE 1 TABLET BY MOUTH  DAILY   sucralfate 1 g tablet Commonly known as: CARAFATE Take 1 tablet (1 g total) by mouth 2 (two) times daily.   Tart Cherry Advanced Caps Take 1 capsule by mouth 2 (two) times daily. Reported on 03/04/2016   triamterene-hydrochlorothiazide 37.5-25 MG tablet Commonly known as: MAXZIDE-25 Take 0.5 tablets by mouth daily.       Signature:  Chesley Mires, MD [place] Pager - [phone] 08/28/2020, 11:05 AM

## 2020-08-29 DIAGNOSIS — G4733 Obstructive sleep apnea (adult) (pediatric): Secondary | ICD-10-CM | POA: Diagnosis not present

## 2020-08-29 MED ORDER — LEVOCETIRIZINE DIHYDROCHLORIDE 5 MG PO TABS: 5.0000 mg | ORAL_TABLET | Freq: Every evening | ORAL | 6 refills | Status: DC

## 2020-08-29 NOTE — Telephone Encounter (Signed)
RX sent to preferred pharmacy for patient. Nothing further needed at this time.

## 2020-09-01 DIAGNOSIS — H2513 Age-related nuclear cataract, bilateral: Secondary | ICD-10-CM | POA: Diagnosis not present

## 2020-09-04 ENCOUNTER — Institutional Professional Consult (permissible substitution): Payer: Medicare HMO | Admitting: Internal Medicine

## 2020-09-05 ENCOUNTER — Other Ambulatory Visit (INDEPENDENT_AMBULATORY_CARE_PROVIDER_SITE_OTHER): Payer: Medicare HMO

## 2020-09-05 ENCOUNTER — Other Ambulatory Visit: Payer: Self-pay

## 2020-09-05 DIAGNOSIS — E871 Hypo-osmolality and hyponatremia: Secondary | ICD-10-CM

## 2020-09-05 NOTE — Addendum Note (Signed)
Addended by: Leeanne Rio on: 09/05/2020 01:29 PM   Modules accepted: Orders

## 2020-09-06 LAB — SODIUM: Sodium: 134 mmol/L (ref 134–144)

## 2020-09-08 ENCOUNTER — Encounter: Payer: Self-pay | Admitting: Internal Medicine

## 2020-09-10 MED ORDER — AMITRIPTYLINE HCL 10 MG PO TABS
10.0000 mg | ORAL_TABLET | Freq: Every day | ORAL | 1 refills | Status: DC
Start: 2020-09-10 — End: 2020-09-18

## 2020-09-10 NOTE — Telephone Encounter (Signed)
rx ok'd for amitriptyline #30 with one refill.

## 2020-09-11 ENCOUNTER — Other Ambulatory Visit
Admission: RE | Admit: 2020-09-11 | Discharge: 2020-09-11 | Disposition: A | Discharge status: Home / Self Care | Payer: Medicare HMO | Source: Ambulatory Visit | Attending: Physician Assistant | Admitting: Physician Assistant

## 2020-09-11 DIAGNOSIS — R1013 Epigastric pain: Secondary | ICD-10-CM | POA: Diagnosis not present

## 2020-09-11 DIAGNOSIS — K449 Diaphragmatic hernia without obstruction or gangrene: Secondary | ICD-10-CM | POA: Diagnosis not present

## 2020-09-11 DIAGNOSIS — R079 Chest pain, unspecified: Secondary | ICD-10-CM | POA: Insufficient documentation

## 2020-09-11 DIAGNOSIS — E7800 Pure hypercholesterolemia, unspecified: Secondary | ICD-10-CM | POA: Diagnosis not present

## 2020-09-11 DIAGNOSIS — R0789 Other chest pain: Secondary | ICD-10-CM | POA: Diagnosis not present

## 2020-09-11 DIAGNOSIS — I10 Essential (primary) hypertension: Secondary | ICD-10-CM | POA: Diagnosis not present

## 2020-09-11 DIAGNOSIS — Z23 Encounter for immunization: Secondary | ICD-10-CM | POA: Diagnosis not present

## 2020-09-11 LAB — TROPONIN I (HIGH SENSITIVITY): Troponin I (High Sensitivity): 4 ng/L (ref <18)

## 2020-09-18 ENCOUNTER — Encounter: Payer: Self-pay | Admitting: Internal Medicine

## 2020-09-18 ENCOUNTER — Other Ambulatory Visit: Payer: Self-pay

## 2020-09-18 ENCOUNTER — Ambulatory Visit (INDEPENDENT_AMBULATORY_CARE_PROVIDER_SITE_OTHER): Payer: Medicare HMO | Admitting: Internal Medicine

## 2020-09-18 DIAGNOSIS — K449 Diaphragmatic hernia without obstruction or gangrene: Secondary | ICD-10-CM

## 2020-09-18 DIAGNOSIS — J309 Allergic rhinitis, unspecified: Secondary | ICD-10-CM

## 2020-09-18 DIAGNOSIS — R0602 Shortness of breath: Secondary | ICD-10-CM | POA: Diagnosis not present

## 2020-09-18 DIAGNOSIS — I10 Essential (primary) hypertension: Secondary | ICD-10-CM | POA: Diagnosis not present

## 2020-09-18 DIAGNOSIS — R42 Dizziness and giddiness: Secondary | ICD-10-CM

## 2020-09-18 DIAGNOSIS — R519 Headache, unspecified: Secondary | ICD-10-CM

## 2020-09-18 DIAGNOSIS — Z789 Other specified health status: Secondary | ICD-10-CM | POA: Insufficient documentation

## 2020-09-18 DIAGNOSIS — G4733 Obstructive sleep apnea (adult) (pediatric): Secondary | ICD-10-CM

## 2020-09-18 DIAGNOSIS — K219 Gastro-esophageal reflux disease without esophagitis: Secondary | ICD-10-CM

## 2020-09-18 DIAGNOSIS — R739 Hyperglycemia, unspecified: Secondary | ICD-10-CM

## 2020-09-18 DIAGNOSIS — E7800 Pure hypercholesterolemia, unspecified: Secondary | ICD-10-CM

## 2020-09-18 DIAGNOSIS — F439 Reaction to severe stress, unspecified: Secondary | ICD-10-CM | POA: Diagnosis not present

## 2020-09-18 DIAGNOSIS — C50919 Malignant neoplasm of unspecified site of unspecified female breast: Secondary | ICD-10-CM

## 2020-09-18 MED ORDER — NYSTATIN 100000 UNIT/GM EX CREA: 1.0000 "application " | TOPICAL_CREAM | Freq: Two times a day (BID) | CUTANEOUS | 0 refills | Status: DC

## 2020-09-18 MED ORDER — AMITRIPTYLINE HCL 10 MG PO TABS
10.0000 mg | ORAL_TABLET | Freq: Every day | ORAL | 1 refills | Status: DC
Start: 2020-09-18 — End: 2020-11-26

## 2020-09-18 NOTE — Progress Notes (Signed)
Patient ID: ITZAMARA CASAS, female   DOB: 12-Mar-1946, 74 y.o.   MRN: [id number]   Subjective:    Patient ID: [name], female    DOB: [birth year], 74 y.o.   MRN: [id number]  HPI This visit occurred during the SARS-CoV-2 public health emergency.  Safety protocols were in place, including screening questions prior to the visit, additional usage of staff PPE, and extensive cleaning of exam room while observing appropriate contact time as indicated for disinfecting solutions.  Patient here for a scheduled follow up.  She is here to f/u regarding her recent issues with headache and dizziness.  She has had recurrent episodic dizziness and headache.  See recent notes.  Saw ENT.  Had negative Hallpike maneuver.  Was seen in ER 08/12/20.  Normal labs and negative CT scan.  Was started on amitriptyline.  This has helped her sleep.  She is feeling better.  Headache is better.  Dizziness is not an issue currently.  She was questioning if her cpap could be aggravating the symptoms.  States symptoms/ tinnitus worsened when changed mask. Seeing pulmonary - Dr Halford Chessman.  Working on adjusting pressure.  Using astelin and taking xyzal. Saw GI 09/11/20 - f/u large paraesophageal hernia.  Recommended continuing aciphex and instructed to increase carafate to tid.  Recommended f/u with Dr Maryjane Hurter.  Also saw cardiology 09/11/20 for f/u chest pain.  Note reviewed. No further cardiac w/up felt warranted at this time.  She reports she is feeling better. Does report some fatigue.  Eating.  Bowels moving.    Past Medical History:  Diagnosis Date  . Abdominal hernia   . Anxiety    situational stress and anxiety  . Cancer ([place]) 2015   breast   . Cataract cortical, senile, unspecified laterality   . Chest pain   . Exertional dyspnea   . GERD (gastroesophageal reflux disease)   . Hiatal hernia   . Hiatal hernia   . Hypercholesterolemia   . Hyperlipidemia   . Hypertension   . Osteoarthritis    scoliosis, degenerative disc  dz, knee, carpal tunnel  . Sleep apnea    Past Surgical History:  Procedure Laterality Date  . ABDOMINAL HYSTERECTOMY  07/2000  . APPENDECTOMY  2000  . APPENDECTOMY    . BREAST BIOPSY Bilateral 2014  . BREAST SURGERY Right 2015   lumpectomy  . CARPAL TUNNEL RELEASE    . COLONOSCOPY  2011   [name], [name]: Normal exam  . COLONOSCOPY WITH PROPOFOL N/A 04/03/2020   Procedure: COLONOSCOPY WITH PROPOFOL;  Surgeon: Toledo, Benay Pike, MD;  Location: [facility] ENDOSCOPY;  Service: Gastroenterology;  Laterality: N/A;  . ESOPHAGOGASTRODUODENOSCOPY (EGD) WITH PROPOFOL N/A 04/03/2020   Procedure: ESOPHAGOGASTRODUODENOSCOPY (EGD) WITH PROPOFOL;  Surgeon: Toledo, Benay Pike, MD;  Location: [facility] ENDOSCOPY;  Service: Gastroenterology;  Laterality: N/A;  . HERNIA REPAIR  02/10/15  . Laparoscopic Repair of Lateral Abdominal Wall Hernia  02/10/15  . MASTECTOMY    . UPPER GI ENDOSCOPY  05/10/2014   [name], [name] Bile gastritis, medium hiatal hernia. Incomplete visualization of the cardia and fundus.   Family History  Problem Relation Age of Onset  . Diabetes Mother   . Multiple myeloma Mother   . Diabetes Brother   . Diabetes Sister        x2  . Hypertension Brother   . Multiple myeloma Father    Social History   Socioeconomic History  . Marital status: Married    Spouse name: Not on file  .  Number of children: 2  . Years of education: Not on file  . Highest education level: Not on file  Occupational History  . Not on file  Tobacco Use  . Smoking status: Never Smoker  . Smokeless tobacco: Never Used  Vaping Use  . Vaping Use: Never used  Substance and Sexual Activity  . Alcohol use: Yes    Alcohol/week: 0.0 standard drinks    Comment: wine occasionally  . Drug use: No  . Sexual activity: Yes  Other Topics Concern  . Not on file  Social History Narrative  . Not on file   Social Determinants of Health   Financial Resource Strain:   . Difficulty of Paying Living Expenses:  Not on file  Food Insecurity:   . Worried About Charity fundraiser in the Last Year: Not on file  . Ran Out of Food in the Last Year: Not on file  Transportation Needs:   . Lack of Transportation (Medical): Not on file  . Lack of Transportation (Non-Medical): Not on file  Physical Activity:   . Days of Exercise per Week: Not on file  . Minutes of Exercise per Session: Not on file  Stress:   . Feeling of Stress : Not on file  Social Connections:   . Frequency of Communication with Friends and Family: Not on file  . Frequency of Social Gatherings with Friends and Family: Not on file  . Attends Religious Services: Not on file  . Active Member of Clubs or Organizations: Not on file  . Attends Archivist Meetings: Not on file  . Marital Status: Not on file    Outpatient Encounter Medications as of 09/18/2020  Medication Sig  . amitriptyline (ELAVIL) 10 MG tablet Take 1 tablet (10 mg total) by mouth at bedtime.  Marland Kitchen anastrozole (ARIMIDEX) 1 MG tablet TAKE 1 TABLET BY MOUTH ONCE DAILY  . aspirin 81 MG tablet Take 81 mg by mouth daily.  Marland Kitchen azelastine (ASTELIN) 0.1 % nasal spray USE 2 SPRAYS IN BOTH  NOSTRILS TWICE DAILY AS  DIRECTED  . calcium citrate-vitamin D 500-400 MG-UNIT chewable tablet Chew 1 tablet by mouth 2 (two) times daily.  . cholecalciferol (VITAMIN D) 1000 UNITS tablet Take 2,000 Units by mouth in the morning and at bedtime.   . fluticasone (FLONASE) 50 MCG/ACT nasal spray PLACE 2 SPRAYS INTO BOTH NOSTRILS DAILY.  Marland Kitchen gabapentin (NEURONTIN) 100 MG capsule Take 2 capsules (200 mg total) by mouth at bedtime.  . irbesartan (AVAPRO) 75 MG tablet TAKE 1 TABLET EVERY DAY  . levocetirizine (XYZAL) 5 MG tablet Take 1 tablet (5 mg total) by mouth every evening.  . methocarbamol (ROBAXIN) 500 MG tablet Take 1 tablet (500 mg total) by mouth at bedtime as needed for muscle spasms.  . Misc Natural Products (TART CHERRY ADVANCED) CAPS Take 1 capsule by mouth 2 (two) times daily.  Reported on 03/04/2016  . MULTIPLE MINERALS-VITAMINS PO   . Multiple Vitamin (MULTIVITAMIN) tablet Take 1 tablet by mouth daily.  Marland Kitchen nystatin cream (MYCOSTATIN) Apply 1 application topically 2 (two) times daily.  Marland Kitchen PEPPERMINT OIL PO Take by mouth.  . RABEprazole (ACIPHEX) 20 MG tablet Take 1 tablet (20 mg total) by mouth 2 (two) times daily.  . rosuvastatin (CRESTOR) 5 MG tablet TAKE 1 TABLET BY MOUTH  DAILY  . sucralfate (CARAFATE) 1 g tablet Take 1 tablet (1 g total) by mouth 2 (two) times daily.  Marland Kitchen triamterene-hydrochlorothiazide (MAXZIDE-25) 37.5-25 MG tablet Take 0.5 tablets  by mouth daily.  . [DISCONTINUED] amitriptyline (ELAVIL) 10 MG tablet Take 1 tablet (10 mg total) by mouth at bedtime.  . [DISCONTINUED] CINNAMON PO Take by mouth 2 (two) times daily.  . [DISCONTINUED] dextromethorphan-guaiFENesin (MUCINEX DM) 30-600 MG 12hr tablet Take 1 tablet by mouth 2 (two) times daily as needed for cough.   No facility-administered encounter medications on file as of 09/18/2020.    Review of Systems  Constitutional: Positive for fatigue. Negative for appetite change and unexpected weight change.  HENT:       No increased congestion or sinus pressure currently.   Respiratory: Negative for cough and chest tightness.        No increased sob.   Cardiovascular: Negative for chest pain and leg swelling.  Gastrointestinal: Negative for abdominal pain, diarrhea, nausea and vomiting.  Genitourinary: Negative for difficulty urinating and dysuria.  Musculoskeletal: Negative for joint swelling and myalgias.  Neurological:       Headaches and dizziness - improved.   Psychiatric/Behavioral: Negative for agitation and dysphoric mood.       Objective:    Physical Exam Vitals reviewed.  Constitutional:      General: She is not in acute distress.    Appearance: Normal appearance.  HENT:     Head: Normocephalic and atraumatic.     Right Ear: External ear normal.     Left Ear: External ear normal.   Eyes:     General: No scleral icterus.       Right eye: No discharge.        Left eye: No discharge.     Conjunctiva/sclera: Conjunctivae normal.  Neck:     Thyroid: No thyromegaly.  Cardiovascular:     Rate and Rhythm: Normal rate and regular rhythm.  Pulmonary:     Effort: No respiratory distress.     Breath sounds: Normal breath sounds. No wheezing.  Abdominal:     General: Bowel sounds are normal.     Palpations: Abdomen is soft.     Tenderness: There is no abdominal tenderness.  Musculoskeletal:        General: No swelling or tenderness.     Cervical back: Neck supple. No tenderness.  Lymphadenopathy:     Cervical: No cervical adenopathy.  Skin:    Findings: No erythema or rash.  Neurological:     Mental Status: She is alert.  Psychiatric:        Mood and Affect: Mood normal.        Behavior: Behavior normal.     BP 136/70   Pulse 81   Temp 97.8 F (36.6 C) (Oral)   Resp 16   Ht 4' 11"  (1.499 m)   Wt 139 lb 3.2 oz (63.1 kg)   SpO2 98%   BMI 28.11 kg/m  Wt Readings from Last 3 Encounters:  09/18/20 139 lb 3.2 oz (63.1 kg)  08/28/20 138 lb 12.8 oz (63 kg)  08/19/20 137 lb (62.1 kg)     Lab Results  Component Value Date   WBC 9.5 08/11/2020   HGB 14.0 08/11/2020   HCT 40.6 08/11/2020   PLT 239 08/11/2020   GLUCOSE 98 08/27/2020   CHOL 181 07/10/2020   TRIG 137.0 07/10/2020   HDL 63.80 07/10/2020   LDLDIRECT 146.9 01/31/2014   LDLCALC 89 07/10/2020   ALT 13 07/10/2020   AST 15 07/10/2020   NA 134 09/05/2020   K 4.0 08/27/2020   CL 96 08/27/2020   CREATININE 0.89 08/27/2020   BUN  13 08/27/2020   CO2 28 08/27/2020   TSH 2.05 10/30/2019   HGBA1C 6.0 07/10/2020       Assessment & Plan:   Problem List Items Addressed This Visit    Stress    Increased stress as outlined.  On amitriptyline to help with sleep, etc.  Sleeping better.  Will continue for now.  Follow.        SOB (shortness of breath)    Breathing overall appears to be stable.   Saw cardiology 09/11/20- no further cardiac w/up warranted.  Continue f/u with pulmonary.       Paraesophageal hiatal hernia    Saw GI.  Recommended referral to Dr Maryjane Hurter.       Obstructive sleep apnea    Continue cpap.  Continue f/u with pulmonary.       Hypertension    Blood pressure on recheck improved.  Continue avapro and triam/hctz.  Follow pressures.  Follow metabolic panel.       Relevant Orders   TSH   Basic metabolic panel   Hyperglycemia    Low carb diet and exercise.  Follow met b and a1c.       Relevant Orders   Hemoglobin A1c   Hypercholesterolemia    On crestor.  Low cholesterol diet and exercise.  Follow lipid panel and liver function tests.        Relevant Orders   Hepatic function panel   Lipid panel   Headache    Improved.  Episodic as outlined.  Has appt with neurology.        Relevant Medications   amitriptyline (ELAVIL) 10 MG tablet   Has immunity to COVID-19 virus   Relevant Orders   SARS-CoV-2 Semi-Quantitative Total Antibody, Spike (Completed)   GERD (gastroesophageal reflux disease)    Has a large paraesophageal hernia.  Seeing GI.  Discussed referral to Dr Maryjane Hurter.  On aciphex.  Recently saw GI with instructions to increase carafate to tid.  Follow.       Dizziness    Undergoing w/up. Saw ENT as outlined.  Has improved.  Keep appt with neurology.       Breast cancer ([place])    On arimidex. Followed by oncology.       Allergic rhinitis    Continue astelin and xyzal.  Stable.           Einar Pheasant, MD

## 2020-09-22 ENCOUNTER — Ambulatory Visit (INDEPENDENT_AMBULATORY_CARE_PROVIDER_SITE_OTHER): Payer: Medicare HMO

## 2020-09-22 ENCOUNTER — Other Ambulatory Visit: Payer: Self-pay

## 2020-09-22 DIAGNOSIS — E538 Deficiency of other specified B group vitamins: Secondary | ICD-10-CM | POA: Diagnosis not present

## 2020-09-22 DIAGNOSIS — R42 Dizziness and giddiness: Secondary | ICD-10-CM | POA: Diagnosis not present

## 2020-09-22 DIAGNOSIS — G4733 Obstructive sleep apnea (adult) (pediatric): Secondary | ICD-10-CM | POA: Diagnosis not present

## 2020-09-22 DIAGNOSIS — Z23 Encounter for immunization: Secondary | ICD-10-CM | POA: Diagnosis not present

## 2020-09-22 DIAGNOSIS — R519 Headache, unspecified: Secondary | ICD-10-CM | POA: Diagnosis not present

## 2020-09-22 DIAGNOSIS — Z9989 Dependence on other enabling machines and devices: Secondary | ICD-10-CM | POA: Diagnosis not present

## 2020-09-23 LAB — SARS-COV-2 SEMI-QUANTITATIVE TOTAL ANTIBODY, SPIKE: SARS COV2 AB, Total Spike Semi QN: 688.6 U/mL — ABNORMAL HIGH (ref <0.8)

## 2020-09-26 ENCOUNTER — Other Ambulatory Visit: Payer: Self-pay | Admitting: Neurology

## 2020-09-26 DIAGNOSIS — R519 Headache, unspecified: Secondary | ICD-10-CM

## 2020-09-26 DIAGNOSIS — R42 Dizziness and giddiness: Secondary | ICD-10-CM

## 2020-09-28 DIAGNOSIS — G4733 Obstructive sleep apnea (adult) (pediatric): Secondary | ICD-10-CM | POA: Diagnosis not present

## 2020-09-29 ENCOUNTER — Encounter: Payer: Self-pay | Admitting: Internal Medicine

## 2020-09-29 NOTE — Assessment & Plan Note (Signed)
On crestor.  Low cholesterol diet and exercise.  Follow lipid panel and liver function tests.   

## 2020-09-29 NOTE — Assessment & Plan Note (Signed)
Improved.  Episodic as outlined.  Has appt with neurology.

## 2020-09-29 NOTE — Assessment & Plan Note (Signed)
Continue astelin and xyzal.  Stable.

## 2020-09-29 NOTE — Assessment & Plan Note (Signed)
Blood pressure on recheck improved.  Continue avapro and triam/hctz.  Follow pressures.  Follow metabolic panel.

## 2020-09-29 NOTE — Assessment & Plan Note (Signed)
Low carb diet and exercise.  Follow met b and a1c.  

## 2020-09-29 NOTE — Assessment & Plan Note (Signed)
Has a large paraesophageal hernia.  Seeing GI.  Discussed referral to Dr Maryjane Hurter.  On aciphex.  Recently saw GI with instructions to increase carafate to tid.  Follow.

## 2020-09-29 NOTE — Assessment & Plan Note (Signed)
Continue cpap.  Continue f/u with pulmonary.   

## 2020-09-29 NOTE — Assessment & Plan Note (Signed)
Increased stress as outlined.  On amitriptyline to help with sleep, etc.  Sleeping better.  Will continue for now.  Follow.

## 2020-09-29 NOTE — Assessment & Plan Note (Signed)
Saw GI.  Recommended referral to Dr Maryjane Hurter.

## 2020-09-29 NOTE — Assessment & Plan Note (Signed)
Breathing overall appears to be stable.  Saw cardiology 09/11/20- no further cardiac w/up warranted.  Continue f/u with pulmonary.

## 2020-09-29 NOTE — Assessment & Plan Note (Signed)
On arimidex. Followed by oncology.

## 2020-09-29 NOTE — Assessment & Plan Note (Signed)
Undergoing w/up. Saw ENT as outlined.  Has improved.  Keep appt with neurology.

## 2020-10-03 DIAGNOSIS — G4733 Obstructive sleep apnea (adult) (pediatric): Secondary | ICD-10-CM | POA: Diagnosis not present

## 2020-10-14 ENCOUNTER — Ambulatory Visit: Payer: Medicare HMO

## 2020-10-16 ENCOUNTER — Encounter: Payer: Self-pay | Admitting: Pulmonary Disease

## 2020-10-16 ENCOUNTER — Other Ambulatory Visit: Payer: Self-pay

## 2020-10-16 ENCOUNTER — Ambulatory Visit (INDEPENDENT_AMBULATORY_CARE_PROVIDER_SITE_OTHER): Payer: Medicare HMO | Admitting: Pulmonary Disease

## 2020-10-16 VITALS — BP 118/64 | HR 84 | Temp 97.1°F | Ht 60.0 in | Wt 141.4 lb

## 2020-10-16 DIAGNOSIS — Z9989 Dependence on other enabling machines and devices: Secondary | ICD-10-CM

## 2020-10-16 DIAGNOSIS — G4733 Obstructive sleep apnea (adult) (pediatric): Secondary | ICD-10-CM | POA: Diagnosis not present

## 2020-10-16 DIAGNOSIS — J3089 Other allergic rhinitis: Secondary | ICD-10-CM | POA: Diagnosis not present

## 2020-10-16 MED ORDER — LEVOCETIRIZINE DIHYDROCHLORIDE 5 MG PO TABS
5.0000 mg | ORAL_TABLET | Freq: Every evening | ORAL | 3 refills | Status: DC
Start: 2020-10-16 — End: 2021-09-09

## 2020-10-16 NOTE — Progress Notes (Signed)
[facility]  Chief Complaint  Patient presents with  . Follow-up    wearing CPAP every night. feels that her pressures are good. denies issues with CPAP machine.    Constitutional:  BP 118/64 (BP Location: Left Arm, Patient Position: Sitting, Cuff Size: Normal)   Pulse 84   Temp (!) 97.1 F (36.2 C) (Temporal)   Ht 5' (1.524 m)   Wt 141 lb 6.4 oz (64.1 kg)   SpO2 98%   BMI 27.62 kg/m   Past Medical History:  HTN, HLD, Allergic rhinitis, Anxiety, OA, Scoliosis, Carpal tunnel, Rt Breast cancer 2013, GERD, Hiatal hernia, Positive ANA  Past Surgical History:  Her  has a past surgical history that includes Appendectomy (2000); Abdominal hysterectomy (07/2000); Carpal tunnel release; Breast biopsy (Bilateral, 2014); Breast surgery (Right, 2015); Colonoscopy (2011); Upper gi endoscopy (05/10/2014); Hernia repair (02/10/15); Laparoscopic Repair of Lateral Abdominal Wall Hernia (02/10/15); Appendectomy; Mastectomy; Esophagogastroduodenoscopy (egd) with propofol (N/A, 04/03/2020); and Colonoscopy with propofol (N/A, 04/03/2020).  Brief Summary:  Carla Ryan is a 74 y.o. female with obstructive sleep apnea and allergic rhinitis.      Subjective:   She is able to tolerate CPAP much better.  She feels lowering pressure and treating allergies has helped.  She isn't having dizziness or headaches.  Saw neurology and was diagnosed with vestibular neuritis.  Physical Exam:   Appearance - well kempt   ENMT - no sinus tenderness, no oral exudate, no LAN, Mallampati 4 airway, no stridor  Respiratory - equal breath sounds bilaterally, no wheezing or rales  CV - s1s2 regular rate and rhythm, no murmurs  Ext - no clubbing, no edema  Skin - no rashes  Psych - normal mood and affect    Sleep Tests:   PSG 10/16/13 >> AHI 40.8, SpO2 low 74.4%  CPAP 09/16/20 to 10/15/20 >> used on 30 of 30 nights with average 8 hrs.  Average AHI 1.6 with CPAP 9  cm H2O  Social History:  She  reports that she has never smoked. She has never used smokeless tobacco. She reports current alcohol use. She reports that she does not use drugs.  Family History:  Her family history includes Diabetes in her brother, mother, and sister; Hypertension in her brother; Multiple myeloma in her father and mother.      Assessment/Plan:   Obstructive sleep apnea. - she is compliant with therapy - uses [facility] for her DME - continue CPAP 9 cm H2O  Allergic rhinitis. - continue flonase, azelastine, xyzal, and Ayr nasal spray  Dizziness, Headache. - likely from vestibular neuritis - improved - followed by Dr. Jennings Books with [facility]  Time Spent Involved in Patient Care on Day of Examination:  23 minutes  Follow up:  Patient Instructions  Follow up in 1 year   Medication List:   Allergies as of 10/16/2020   No Known Allergies     Medication List       Accurate as of October 16, 2020 11:30 AM. If you have any questions, ask your nurse or doctor.        amitriptyline 10 MG tablet Commonly known as: ELAVIL Take 1 tablet (10 mg total) by mouth at bedtime.   anastrozole 1 MG tablet Commonly known as: ARIMIDEX TAKE 1 TABLET BY MOUTH ONCE DAILY   aspirin 81 MG tablet Take 81 mg by mouth daily.   azelastine 0.1 % nasal spray Commonly known as: ASTELIN USE 2  SPRAYS IN BOTH  NOSTRILS TWICE DAILY AS  DIRECTED   calcium citrate-vitamin D 500-400 MG-UNIT chewable tablet Chew 1 tablet by mouth 2 (two) times daily.   cholecalciferol 1000 units tablet Commonly known as: VITAMIN D Take 2,000 Units by mouth in the morning and at bedtime.   fluticasone 50 MCG/ACT nasal spray Commonly known as: FLONASE PLACE 2 SPRAYS INTO BOTH NOSTRILS DAILY.   gabapentin 100 MG capsule Commonly known as: NEURONTIN Take 2 capsules (200 mg total) by mouth at bedtime.   irbesartan 75 MG tablet Commonly known as: AVAPRO TAKE 1  TABLET EVERY DAY   levocetirizine 5 MG tablet Commonly known as: Xyzal Take 1 tablet (5 mg total) by mouth every evening.   methocarbamol 500 MG tablet Commonly known as: Robaxin Take 1 tablet (500 mg total) by mouth at bedtime as needed for muscle spasms.   MULTIPLE MINERALS-VITAMINS PO   multivitamin tablet Take 1 tablet by mouth daily.   nystatin cream Commonly known as: MYCOSTATIN Apply 1 application topically 2 (two) times daily.   PEPPERMINT OIL PO Take by mouth.   RABEprazole 20 MG tablet Commonly known as: ACIPHEX Take 1 tablet (20 mg total) by mouth 2 (two) times daily.   rosuvastatin 5 MG tablet Commonly known as: CRESTOR TAKE 1 TABLET BY MOUTH  DAILY What changed:   how much to take  how to take this  when to take this  additional instructions   sucralfate 1 g tablet Commonly known as: CARAFATE Take 1 tablet (1 g total) by mouth 2 (two) times daily. What changed: when to take this   Tart Cherry Advanced Caps Take 1 capsule by mouth 2 (two) times daily. Reported on 03/04/2016   triamterene-hydrochlorothiazide 37.5-25 MG tablet Commonly known as: MAXZIDE-25 Take 0.5 tablets by mouth daily.       Signature:  Chesley Mires, MD [place] Pager - [phone] 10/16/2020, 11:30 AM

## 2020-10-16 NOTE — Patient Instructions (Signed)
Follow up in 1 year.

## 2020-10-29 ENCOUNTER — Ambulatory Visit
Admission: RE | Admit: 2020-10-29 | Discharge: 2020-10-29 | Disposition: A | Discharge status: Home / Self Care | Payer: Medicare HMO | Source: Ambulatory Visit | Attending: Neurology | Admitting: Neurology

## 2020-10-29 ENCOUNTER — Other Ambulatory Visit: Payer: Self-pay

## 2020-10-29 DIAGNOSIS — R519 Headache, unspecified: Secondary | ICD-10-CM

## 2020-10-29 DIAGNOSIS — R42 Dizziness and giddiness: Secondary | ICD-10-CM

## 2020-10-29 DIAGNOSIS — G4733 Obstructive sleep apnea (adult) (pediatric): Secondary | ICD-10-CM | POA: Diagnosis not present

## 2020-10-29 MED ORDER — GADOBUTROL 1 MMOL/ML IV SOLN
6.0000 mL | Freq: Once | INTRAVENOUS | Status: AC | PRN
  Administered 2020-10-29: 6 mL via INTRAVENOUS

## 2020-10-30 ENCOUNTER — Encounter: Payer: Self-pay | Admitting: Internal Medicine

## 2020-10-30 DIAGNOSIS — G4733 Obstructive sleep apnea (adult) (pediatric): Secondary | ICD-10-CM | POA: Diagnosis not present

## 2020-10-30 NOTE — Telephone Encounter (Signed)
If she desires to discuss and do a virtual, ok.  I do not want her to just stop the medication.  I would want her to taper.  Have her take one qod x 10 days and then one every third day for another week and then stop.  Let us know if any problems.

## 2020-10-31 ENCOUNTER — Other Ambulatory Visit: Payer: Self-pay | Admitting: Internal Medicine

## 2020-11-25 ENCOUNTER — Other Ambulatory Visit (INDEPENDENT_AMBULATORY_CARE_PROVIDER_SITE_OTHER): Payer: Medicare HMO

## 2020-11-25 ENCOUNTER — Other Ambulatory Visit: Payer: Self-pay

## 2020-11-25 DIAGNOSIS — I10 Essential (primary) hypertension: Secondary | ICD-10-CM | POA: Diagnosis not present

## 2020-11-25 DIAGNOSIS — R739 Hyperglycemia, unspecified: Secondary | ICD-10-CM | POA: Diagnosis not present

## 2020-11-25 DIAGNOSIS — E7800 Pure hypercholesterolemia, unspecified: Secondary | ICD-10-CM

## 2020-11-25 LAB — BASIC METABOLIC PANEL
BUN: 21 mg/dL (ref 6–23)
CO2: 28 mEq/L (ref 19–32)
Calcium: 9.7 mg/dL (ref 8.4–10.5)
Chloride: 101 mEq/L (ref 96–112)
Creatinine, Ser: 1.07 mg/dL (ref 0.40–1.20)
GFR: 50.35 mL/min — ABNORMAL LOW (ref >60.00)
Glucose, Bld: 97 mg/dL (ref 70–99)
Potassium: 3.8 mEq/L (ref 3.5–5.1)
Sodium: 137 mEq/L (ref 135–145)

## 2020-11-25 LAB — HEPATIC FUNCTION PANEL
ALT: 20 U/L (ref 0–35)
AST: 20 U/L (ref 0–37)
Albumin: 4 g/dL (ref 3.5–5.2)
Alkaline Phosphatase: 55 U/L (ref 39–117)
Bilirubin, Direct: 0.1 mg/dL (ref 0.0–0.3)
Total Bilirubin: 0.4 mg/dL (ref 0.2–1.2)
Total Protein: 6.6 g/dL (ref 6.0–8.3)

## 2020-11-25 LAB — LIPID PANEL
Cholesterol: 161 mg/dL (ref 0–200)
HDL: 63.8 mg/dL (ref >39.00)
LDL Cholesterol: 81 mg/dL (ref 0–99)
NonHDL: 97.43
Total CHOL/HDL Ratio: 3
Triglycerides: 83 mg/dL (ref 0.0–149.0)
VLDL: 16.6 mg/dL (ref 0.0–40.0)

## 2020-11-25 LAB — TSH: TSH: 2.36 u[IU]/mL (ref 0.35–4.50)

## 2020-11-25 LAB — HEMOGLOBIN A1C: Hgb A1c MFr Bld: 6.1 % (ref 4.6–6.5)

## 2020-11-26 ENCOUNTER — Ambulatory Visit (INDEPENDENT_AMBULATORY_CARE_PROVIDER_SITE_OTHER): Payer: Medicare HMO | Admitting: Internal Medicine

## 2020-11-26 ENCOUNTER — Encounter: Payer: Self-pay | Admitting: Internal Medicine

## 2020-11-26 ENCOUNTER — Other Ambulatory Visit: Payer: Self-pay

## 2020-11-26 VITALS — BP 112/62 | HR 65 | Temp 97.7°F | Ht 60.0 in | Wt 142.6 lb

## 2020-11-26 DIAGNOSIS — Z0000 Encounter for general adult medical examination without abnormal findings: Secondary | ICD-10-CM

## 2020-11-26 DIAGNOSIS — G4733 Obstructive sleep apnea (adult) (pediatric): Secondary | ICD-10-CM

## 2020-11-26 DIAGNOSIS — R739 Hyperglycemia, unspecified: Secondary | ICD-10-CM

## 2020-11-26 DIAGNOSIS — R42 Dizziness and giddiness: Secondary | ICD-10-CM

## 2020-11-26 DIAGNOSIS — E7800 Pure hypercholesterolemia, unspecified: Secondary | ICD-10-CM | POA: Diagnosis not present

## 2020-11-26 DIAGNOSIS — R944 Abnormal results of kidney function studies: Secondary | ICD-10-CM

## 2020-11-26 DIAGNOSIS — K449 Diaphragmatic hernia without obstruction or gangrene: Secondary | ICD-10-CM

## 2020-11-26 DIAGNOSIS — I10 Essential (primary) hypertension: Secondary | ICD-10-CM | POA: Diagnosis not present

## 2020-11-26 DIAGNOSIS — Z0001 Encounter for general adult medical examination with abnormal findings: Secondary | ICD-10-CM | POA: Diagnosis not present

## 2020-11-26 DIAGNOSIS — K219 Gastro-esophageal reflux disease without esophagitis: Secondary | ICD-10-CM

## 2020-11-26 DIAGNOSIS — M545 Low back pain, unspecified: Secondary | ICD-10-CM

## 2020-11-26 DIAGNOSIS — C50919 Malignant neoplasm of unspecified site of unspecified female breast: Secondary | ICD-10-CM

## 2020-11-26 DIAGNOSIS — F439 Reaction to severe stress, unspecified: Secondary | ICD-10-CM | POA: Diagnosis not present

## 2020-11-26 NOTE — Assessment & Plan Note (Signed)
Physical today 11/26/20.  cologuard 09/29/18- negative.  Mammogram 01/29/20 - Birads I.

## 2020-11-26 NOTE — Progress Notes (Signed)
Patient ID: Carla Ryan, female   DOB: 1946/09/28, 74 y.o.   MRN: [id number]   Subjective:    Patient ID: Carla Ryan, female    DOB: 1946/01/05, 74 y.o.   MRN: [id number]  HPI This visit occurred during the SARS-CoV-2 public health emergency.  Safety protocols were in place, including screening questions prior to the visit, additional usage of staff PPE, and extensive cleaning of exam room while observing appropriate contact time as indicated for disinfecting solutions.  Patient here for her physical exam.  She recently had problems with dizziness and headache.  See recent notes for details.  Saw ENT and neurology.  ENT - negative hallpike maneuver.  Neurology ordered MRI brain - unremarkable.  Dizziness has resolved.  Headache resolved.  Off amitriptyline now.  Using cpap.  Doing ok with this.  No chest pain or sob reported.  No abdominal pain or bowel change reported.  Is having increased pain - low back.  Aggravated if standing for long periods, etc.  Affects her activity.  Discussed PT.  Discussed referral to Dr Sharlet Salina.  Agreeable.  Discussed labs.    Past Medical History:  Diagnosis Date  . Abdominal hernia   . Anxiety    situational stress and anxiety  . Cancer ([place]) 2015   breast   . Cataract cortical, senile, unspecified laterality   . Chest pain   . Exertional dyspnea   . GERD (gastroesophageal reflux disease)   . Hiatal hernia   . Hiatal hernia   . Hypercholesterolemia   . Hyperlipidemia   . Hypertension   . Osteoarthritis    scoliosis, degenerative disc dz, knee, carpal tunnel  . Sleep apnea    Past Surgical History:  Procedure Laterality Date  . ABDOMINAL HYSTERECTOMY  07/2000  . APPENDECTOMY  2000  . APPENDECTOMY    . BREAST BIOPSY Bilateral 2014  . BREAST SURGERY Right 2015   lumpectomy  . CARPAL TUNNEL RELEASE    . COLONOSCOPY  2011   [name], [name]: Normal exam  . COLONOSCOPY WITH PROPOFOL N/A 04/03/2020   Procedure: COLONOSCOPY WITH PROPOFOL;   Surgeon: Toledo, Benay Pike, MD;  Location: [facility] ENDOSCOPY;  Service: Gastroenterology;  Laterality: N/A;  . ESOPHAGOGASTRODUODENOSCOPY (EGD) WITH PROPOFOL N/A 04/03/2020   Procedure: ESOPHAGOGASTRODUODENOSCOPY (EGD) WITH PROPOFOL;  Surgeon: Toledo, Benay Pike, MD;  Location: [facility] ENDOSCOPY;  Service: Gastroenterology;  Laterality: N/A;  . HERNIA REPAIR  02/10/15  . Laparoscopic Repair of Lateral Abdominal Wall Hernia  02/10/15  . MASTECTOMY    . UPPER GI ENDOSCOPY  05/10/2014   [name], [name] Bile gastritis, medium hiatal hernia. Incomplete visualization of the cardia and fundus.   Family History  Problem Relation Age of Onset  . Diabetes Mother   . Multiple myeloma Mother   . Diabetes Brother   . Diabetes Sister        x2  . Hypertension Brother   . Multiple myeloma Father    Social History   Socioeconomic History  . Marital status: Married    Spouse name: Not on file  . Number of children: 2  . Years of education: Not on file  . Highest education level: Not on file  Occupational History  . Not on file  Tobacco Use  . Smoking status: Never Smoker  . Smokeless tobacco: Never Used  Vaping Use  . Vaping Use: Never used  Substance and Sexual Activity  . Alcohol use: Yes    Alcohol/week: 0.0 standard drinks  Comment: wine occasionally  . Drug use: No  . Sexual activity: Yes  Other Topics Concern  . Not on file  Social History Narrative  . Not on file   Social Determinants of Health   Financial Resource Strain: Not on file  Food Insecurity: Not on file  Transportation Needs: Not on file  Physical Activity: Not on file  Stress: Not on file  Social Connections: Not on file    Outpatient Encounter Medications as of 11/26/2020  Medication Sig  . anastrozole (ARIMIDEX) 1 MG tablet TAKE 1 TABLET BY MOUTH ONCE DAILY  . aspirin 81 MG tablet Take 81 mg by mouth daily.  Marland Kitchen azelastine (ASTELIN) 0.1 % nasal spray USE 2 SPRAYS IN BOTH  NOSTRILS TWICE DAILY AS  DIRECTED   . calcium citrate-vitamin D 500-400 MG-UNIT chewable tablet Chew 1 tablet by mouth 2 (two) times daily.  . cholecalciferol (VITAMIN D) 1000 UNITS tablet Take 2,000 Units by mouth in the morning and at bedtime.   . fluticasone (FLONASE) 50 MCG/ACT nasal spray PLACE 2 SPRAYS INTO BOTH NOSTRILS DAILY.  Marland Kitchen gabapentin (NEURONTIN) 100 MG capsule Take 2 capsules (200 mg total) by mouth at bedtime.  . irbesartan (AVAPRO) 75 MG tablet TAKE 1 TABLET EVERY DAY  . levocetirizine (XYZAL) 5 MG tablet Take 1 tablet (5 mg total) by mouth every evening.  . methocarbamol (ROBAXIN) 500 MG tablet Take 1 tablet (500 mg total) by mouth at bedtime as needed for muscle spasms.  . Misc Natural Products (TART CHERRY ADVANCED) CAPS Take 1 capsule by mouth 2 (two) times daily. Reported on 03/04/2016  . MULTIPLE MINERALS-VITAMINS PO   . Multiple Vitamin (MULTIVITAMIN) tablet Take 1 tablet by mouth daily.  Marland Kitchen nystatin cream (MYCOSTATIN) Apply 1 application topically 2 (two) times daily.  Marland Kitchen PEPPERMINT OIL PO Take by mouth.  . RABEprazole (ACIPHEX) 20 MG tablet Take 1 tablet (20 mg total) by mouth 2 (two) times daily.  . rosuvastatin (CRESTOR) 5 MG tablet TAKE 1 TABLET BY MOUTH  DAILY (Patient taking differently: Monday, Wednesday, Friday, Saturday.)  . sucralfate (CARAFATE) 1 g tablet Take 1 tablet (1 g total) by mouth 2 (two) times daily. (Patient taking differently: Take 1 g by mouth in the morning, at noon, and at bedtime. )  . triamterene-hydrochlorothiazide (MAXZIDE-25) 37.5-25 MG tablet TAKE 1/2 TABLET EVERY DAY  . [DISCONTINUED] amitriptyline (ELAVIL) 10 MG tablet Take 1 tablet (10 mg total) by mouth at bedtime.   No facility-administered encounter medications on file as of 11/26/2020.    Review of Systems  Constitutional: Negative for appetite change and unexpected weight change.  HENT: Negative for congestion, sinus pressure and sore throat.   Eyes: Negative for pain and visual disturbance.  Respiratory: Negative  for cough, chest tightness and shortness of breath.   Cardiovascular: Negative for chest pain, palpitations and leg swelling.  Gastrointestinal: Negative for abdominal pain, diarrhea, nausea and vomiting.  Genitourinary: Negative for difficulty urinating and dysuria.  Musculoskeletal: Positive for back pain. Negative for joint swelling and myalgias.  Skin: Negative for color change and rash.  Neurological: Negative for dizziness, light-headedness and headaches.  Hematological: Negative for adenopathy. Does not bruise/bleed easily.  Psychiatric/Behavioral: Negative for agitation and dysphoric mood.       Objective:    Physical Exam Vitals reviewed.  Constitutional:      General: She is not in acute distress.    Appearance: Normal appearance. She is well-developed and well-nourished.  HENT:     Head: Normocephalic and  atraumatic.     Right Ear: External ear normal.     Left Ear: External ear normal.     Mouth/Throat:     Mouth: Oropharynx is clear and moist.  Eyes:     General: No scleral icterus.       Right eye: No discharge.        Left eye: No discharge.     Conjunctiva/sclera: Conjunctivae normal.  Neck:     Thyroid: No thyromegaly.  Cardiovascular:     Rate and Rhythm: Normal rate and regular rhythm.  Pulmonary:     Effort: No tachypnea, accessory muscle usage or respiratory distress.     Breath sounds: Normal breath sounds. No decreased breath sounds or wheezing.  Chest:  Breasts:     Right: No inverted nipple, mass, nipple discharge or tenderness (no axillary adenopathy).     Left: No inverted nipple, mass, nipple discharge or tenderness (no axilarry adenopathy).    Abdominal:     General: Bowel sounds are normal.     Palpations: Abdomen is soft.     Tenderness: There is no abdominal tenderness.  Musculoskeletal:        General: No swelling, tenderness or edema.     Cervical back: Neck supple. No tenderness.  Lymphadenopathy:     Cervical: No cervical  adenopathy.  Skin:    Findings: No erythema or rash.  Neurological:     Mental Status: She is alert and oriented to person, place, and time.  Psychiatric:        Mood and Affect: Mood and affect and mood normal.        Behavior: Behavior normal.     BP 112/62 (BP Location: Left Arm, Patient Position: Sitting)   Pulse 65   Temp 97.7 F (36.5 C)   Ht 5' (1.524 m)   Wt 142 lb 9.6 oz (64.7 kg)   SpO2 97%   BMI 27.85 kg/m  Wt Readings from Last 3 Encounters:  11/26/20 142 lb 9.6 oz (64.7 kg)  10/16/20 141 lb 6.4 oz (64.1 kg)  09/18/20 139 lb 3.2 oz (63.1 kg)     Lab Results  Component Value Date   WBC 9.5 08/11/2020   HGB 14.0 08/11/2020   HCT 40.6 08/11/2020   PLT 239 08/11/2020   GLUCOSE 97 11/25/2020   CHOL 161 11/25/2020   TRIG 83.0 11/25/2020   HDL 63.80 11/25/2020   LDLDIRECT 146.9 01/31/2014   LDLCALC 81 11/25/2020   ALT 20 11/25/2020   AST 20 11/25/2020   NA 137 11/25/2020   K 3.8 11/25/2020   CL 101 11/25/2020   CREATININE 1.07 11/25/2020   BUN 21 11/25/2020   CO2 28 11/25/2020   TSH 2.36 11/25/2020   HGBA1C 6.1 11/25/2020    MR BRAIN W WO CONTRAST  Result Date: 10/29/2020 CLINICAL DATA:  New onset headaches after age 28, dizziness. EXAM: MRI HEAD WITHOUT AND WITH CONTRAST TECHNIQUE: Multiplanar, multiecho pulse sequences of the brain and surrounding structures were obtained without and with intravenous contrast. CONTRAST:  49m GADAVIST GADOBUTROL 1 MMOL/ML IV SOLN COMPARISON:  Head CT August 11, 2020. FINDINGS: Brain: No acute infarction, hemorrhage, hydrocephalus, extra-axial collection or mass lesion. The brain parenchyma has normal morphology and signal characteristics. No focus of abnormal contrast enhancement. Vascular: Normal flow voids. Skull and upper cervical spine: Degenerative changes of the cervical spine. No focal marrow lesion. Sinuses/Orbits: Negative. Other: None. IMPRESSION: Unremarkable MRI of the brain. Electronically Signed   By: KErven Colla  Karenann Cai [name]   On: 10/29/2020 10:56       Assessment & Plan:   Problem List Items Addressed This Visit    Stress    Overall doing well. Sleeping better.  Off amitriptyline.        Paraesophageal hiatal hernia    Saw GI.  Recommended referral as outlined.       Obstructive sleep apnea    Continue cpap.  Uses regularly.  Continue f/u with pulmonary.        Hypertension    Blood pressure as outlined.  Continue avapro and triam/hctz.  Follow pressures. Follow metabolic panel.       Hyperglycemia    Low carb diet and exercise.  Follow met b and a1c.       Hypercholesterolemia    On crestor.  Low cholesterol diet and exercise. Follow lipid panel and liver function tests.        Health care maintenance    Physical today 11/26/20.  cologuard 09/29/18- negative.  Mammogram 01/29/20 - Birads I.       GERD (gastroesophageal reflux disease)    Has a large paraesophageal hernia.  Has seen GI.  On aciphex and carafate.  Planning to f/u with GI about referral to Dr Maryjane Hurter.       Dizziness    Saw ENT. Note reviewed.  Dizziness resolved.        Decreased GFR    Stay hydrated.  Avoid antiinflammatories.  Follow met b.        Relevant Orders   Basic metabolic panel   Breast cancer ([place])    On armidex.  Followed by oncology.       Back pain    Persistent pain.  Increased recently.  Has been evaluated.  Discussed referral to Dr Sharlet Salina.  Also PT.  She is agreeable.        Relevant Orders   Ambulatory referral to Orthopedic Surgery   Ambulatory referral to Physical Therapy       Einar Pheasant, MD

## 2020-11-27 DIAGNOSIS — G4733 Obstructive sleep apnea (adult) (pediatric): Secondary | ICD-10-CM | POA: Diagnosis not present

## 2020-11-28 DIAGNOSIS — G4733 Obstructive sleep apnea (adult) (pediatric): Secondary | ICD-10-CM | POA: Diagnosis not present

## 2020-12-01 ENCOUNTER — Encounter: Payer: Self-pay | Admitting: Internal Medicine

## 2020-12-01 DIAGNOSIS — M8580 Other specified disorders of bone density and structure, unspecified site: Secondary | ICD-10-CM | POA: Diagnosis not present

## 2020-12-01 DIAGNOSIS — M8589 Other specified disorders of bone density and structure, multiple sites: Secondary | ICD-10-CM | POA: Diagnosis not present

## 2020-12-01 NOTE — Assessment & Plan Note (Signed)
Has a large paraesophageal hernia.  Has seen GI.  On aciphex and carafate.  Planning to f/u with GI about referral to Dr Maryjane Hurter.

## 2020-12-01 NOTE — Assessment & Plan Note (Signed)
Low carb diet and exercise.  Follow met b and a1c.  

## 2020-12-01 NOTE — Assessment & Plan Note (Signed)
On armidex.  Followed by oncology.

## 2020-12-01 NOTE — Assessment & Plan Note (Signed)
Overall doing well. Sleeping better.  Off amitriptyline.

## 2020-12-01 NOTE — Assessment & Plan Note (Signed)
Saw ENT. Note reviewed.  Dizziness resolved.

## 2020-12-01 NOTE — Assessment & Plan Note (Signed)
Saw GI.  Recommended referral as outlined.

## 2020-12-01 NOTE — Assessment & Plan Note (Signed)
Continue cpap.  Uses regularly.  Continue f/u with pulmonary.

## 2020-12-01 NOTE — Assessment & Plan Note (Signed)
Stay hydrated.  Avoid antiinflammatories.  Follow met b.  

## 2020-12-01 NOTE — Assessment & Plan Note (Signed)
On crestor.  Low cholesterol diet and exercise.  Follow lipid panel and liver function tests.   

## 2020-12-01 NOTE — Assessment & Plan Note (Signed)
Blood pressure as outlined.  Continue avapro and triam/hctz.  Follow pressures. Follow metabolic panel.

## 2020-12-01 NOTE — Assessment & Plan Note (Signed)
Persistent pain.  Increased recently.  Has been evaluated.  Discussed referral to Dr Sharlet Salina.  Also PT.  She is agreeable.

## 2020-12-24 ENCOUNTER — Other Ambulatory Visit: Payer: Self-pay

## 2020-12-24 ENCOUNTER — Other Ambulatory Visit (INDEPENDENT_AMBULATORY_CARE_PROVIDER_SITE_OTHER): Payer: Medicare HMO

## 2020-12-24 DIAGNOSIS — R944 Abnormal results of kidney function studies: Secondary | ICD-10-CM

## 2020-12-24 LAB — BASIC METABOLIC PANEL
BUN: 26 mg/dL — ABNORMAL HIGH (ref 6–23)
CO2: 30 mEq/L (ref 19–32)
Calcium: 9.5 mg/dL (ref 8.4–10.5)
Chloride: 103 mEq/L (ref 96–112)
Creatinine, Ser: 1.09 mg/dL (ref 0.40–1.20)
GFR: 49.22 mL/min — ABNORMAL LOW (ref >60.00)
Glucose, Bld: 94 mg/dL (ref 70–99)
Potassium: 4.3 mEq/L (ref 3.5–5.1)
Sodium: 138 mEq/L (ref 135–145)

## 2020-12-29 DIAGNOSIS — G4733 Obstructive sleep apnea (adult) (pediatric): Secondary | ICD-10-CM | POA: Diagnosis not present

## 2020-12-31 ENCOUNTER — Ambulatory Visit: Payer: Medicare HMO | Attending: Internal Medicine | Admitting: Physical Therapy

## 2020-12-31 ENCOUNTER — Encounter: Payer: Self-pay | Admitting: Physical Therapy

## 2020-12-31 ENCOUNTER — Other Ambulatory Visit: Payer: Self-pay

## 2020-12-31 DIAGNOSIS — M5442 Lumbago with sciatica, left side: Secondary | ICD-10-CM | POA: Diagnosis not present

## 2020-12-31 DIAGNOSIS — R262 Difficulty in walking, not elsewhere classified: Secondary | ICD-10-CM | POA: Diagnosis not present

## 2020-12-31 NOTE — Therapy (Signed)
[facility] [address] [place], [address] Phone: [phone]   Fax:  [phone]  Physical Therapy Evaluation  Patient Details  Name: Carla Ryan MRN: [id number] Date of Birth: 1946/06/07 Referring Provider (PT): Einar Pheasant, MD   Encounter Date: 12/31/2020   PT End of Session - 01/01/21 0926    Visit Number 1    Number of Visits 24    Date for PT Re-Evaluation 03/25/21    Authorization Type [employer] reporting period from 12/31/2020    Progress Note Due on Visit 10    PT Start Time 0945    PT Stop Time 1025    PT Time Calculation (min) 40 min    Activity Tolerance Patient tolerated treatment well    Behavior During Therapy [facility] for tasks assessed/performed           Past Medical History:  Diagnosis Date  . Abdominal hernia   . Anxiety    situational stress and anxiety  . Cancer ([place]) 2015   breast   . Cataract cortical, senile, unspecified laterality   . Chest pain   . Exertional dyspnea   . GERD (gastroesophageal reflux disease)   . Hiatal hernia   . Hiatal hernia   . Hypercholesterolemia   . Hyperlipidemia   . Hypertension   . Osteoarthritis    scoliosis, degenerative disc dz, knee, carpal tunnel  . Sleep apnea     Past Surgical History:  Procedure Laterality Date  . ABDOMINAL HYSTERECTOMY  07/2000  . APPENDECTOMY  2000  . APPENDECTOMY    . BREAST BIOPSY Bilateral 2014  . BREAST SURGERY Right 2015   lumpectomy  . CARPAL TUNNEL RELEASE    . COLONOSCOPY  2011   Carla Ryan, [name]: Normal exam  . COLONOSCOPY WITH PROPOFOL N/A 04/03/2020   Procedure: COLONOSCOPY WITH PROPOFOL;  Surgeon: Toledo, Benay Pike, MD;  Location: [facility] ENDOSCOPY;  Service: Gastroenterology;  Laterality: N/A;  . ESOPHAGOGASTRODUODENOSCOPY (EGD) WITH PROPOFOL N/A 04/03/2020   Procedure: ESOPHAGOGASTRODUODENOSCOPY (EGD) WITH PROPOFOL;  Surgeon: Toledo, Benay Pike, MD;  Location: [facility] ENDOSCOPY;   Service: Gastroenterology;  Laterality: N/A;  . HERNIA REPAIR  02/10/15  . Laparoscopic Repair of Lateral Abdominal Wall Hernia  02/10/15  . MASTECTOMY    . UPPER GI ENDOSCOPY  05/10/2014   Carla Ryan, [name] Bile gastritis, medium hiatal hernia. Incomplete visualization of the cardia and fundus.    There were no vitals filed for this visit.    Subjective Assessment - 12/31/20 0957    Subjective Patient reports she has scoliosis and multiple issues. It is quite new that she has lower back pain and she thinks it may because she is out of shape. She was doing a lot of bending over the holidays. She was cutting shrubs sitting down. Since then it has been better but it was bothering her yesterday. She has had pain in her mid thoracic spine for a long time but the new low back pain is across the low back and then down the left leg occasionally spiraling from back to lateral/anterior knee. Does not go below knee. This happens when she is standing drying her hair. She also seems to have more pain when she has slippers on without support. She has had episodes like this in the past. No injuries or accidents. No spinal surgeries. Seems like it is getting better but she still has bad days.    Pertinent History Patient is a 75 y.o.  female who presents to outpatient physical therapy with a referral for medical diagnosis low back pain. This patient's chief complaints consist of episodic low back pain with most recent episode starting Nov/Dec 2021, radiation down left leg above knee, leading to the following functional deficits: difficulty with walking, bending, gardening, picking strawberries, walking, standing, cooking, walking up hill, etc.  Relevant past medical history and comorbidities include scoliosis,right upper back/shoulder region (referred to Dr. Sharlet Ryan), breast cancer in  2015 (takes a hormome therapy, followed by cancer center), fatigue, osteopenia, OSA, GED, abdominal hernia (repair 2016), shortness  of breath after eating, hiatial hernia (supposed to see surgeon), dizziness (episodes in Aug 2021), HTN, see chart for additional history.  Patient denies hx of stroke, seizures, lung problem, major cardiac events, diabetes, unexplained weight loss, changes in bowel or bladder problems, new onset stumbling or dropping things.    Limitations Standing;Walking;Lifting;House hold activities    How long can you sit comfortably? doesn't bother her much    How long can you stand comfortably? 30-60 min in the kitchen but it hurts    How long can you walk comfortably? 10-15 min    Diagnostic tests no recent imaging  Lumbar/thoracic MRI report from 09/09/2014: "Impression:   1. Severe levoscoliosis of the thoracolumbar spine, centered at L1-L2. No   canal or foraminal stenosis in the thoracic spine. Multilevel degenerative   disc disease in the lumbar spine without advanced canal or foraminal   stenosis.   2. No pathologic marrow signal noted. "    Currently in Pain? Yes    Pain Score 2    W: 8-9/10; B: 0/10   Pain Location Back    Pain Orientation Right;Left    Pain Descriptors / Indicators Tightness;Dull;Sharp    Pain Type Chronic pain    Pain Radiating Towards left LE glute to lateral leg near knee (not below).    Pain Onset More than a month ago    Aggravating Factors  walking without support, carrying a pockbook, prolonged standing, walking up a hill, bending,    Pain Relieving Factors having support of a cart when walking, sitting, warm tub bath, massage from husband,    Effect of Pain on Daily Activities Functional Limitations: bending, gardening, picking strawberries, walking, standing, cooking, lifting more than a gallon of milk, walking up hill, etc.              [facility] PT Assessment - 01/01/21 0935      Assessment   Medical Diagnosis Low back pain, unspecified back pain laterality, unspecified chronicity, unspecified whether sciatica present    Referring Provider (PT) Einar Pheasant, MD     Onset Date/Surgical Date 10/20/20   latest episode but intermittant chronic   Hand Dominance Left    Next MD Visit april    Prior Therapy none for this problem prior to curent episode of care      Precautions   Precautions Other (comment)   no blood pressure or IVs in R arm     Restrictions   Weight Bearing Restrictions No      Home Environment   Living Environment --   no concerns about getting around home     Prior Function   Level of Independence Independent    Vocation Retired    [employer], read, watch TV, get outdoors      Cognition   Overall Cognitive Status Within Functional Limits for tasks assessed  Observation/Other Assessments   Focus on Therapeutic Outcomes (FOTO)  56           OBJECTIVE  OBSERVATION/INSPECTION . Posture: left lataral shift. Large rib hump at lumbothoracic junction with forward flexion.  . Tremor: none . Muscle bulk: grossly WFL   . Bed mobility: supine <> sit and rolling WFL with some discomfort . Transfers: sit <> stand WFL except painful . Gait: grossly WFL for household and short community ambulation. More detailed gait analysis deferred to later date as needed. Reports pain with prolonged walking.   NEUROLOGICAL  Upper Motor Neuron Screen Hoffman's negative bilaterally, and Clonus (ankle) negative on right and one catch on left. Babinski with very slight upgoing with the left, minimal to no respones on the right.  Dermatomes . L2-S2 appears equal and intact to light touch. Myotomes . L2-S2 appears intact Deep Tendon Reflexes R/L  . 2+/3+ Quadriceps reflex (L4) . 1+/2+ Achilles reflex (S1) Neurodynamic Tests    L = postive for posterior thigh pain.  SPINE MOTION Lumbar AROM *Indicates pain - Flexion: = 1 inch to toes, pulling low back down backs of legs. . - Extension: = 50% nice stretch - Rotation: B WFL considering scoliosis - Side Flexion:  B WFL considering  scoliosis  PERIPHERAL JOINT MOTION (in degrees) Passive Range of Motion (PROM) Comments:  B LE grossly WFL for basic mobility with mild end range discomfort with some hip motions.   MUSCLE PERFORMANCE (MMT):  *Indicates pain 12/31/20 Date Date  Joint/Motion R/L R/L R/L  Hip     Flexion / / /  Extension (knee ext) 4+/4+ / /  Abduction 4+/4+ / /  Comments: slighlty les firmenss on left LE  REPEATED MOTIONS TESTING: Motion/Technique sets x reps During After  Lumbar extension in prone lying 1x10 Feels like good stretch No change  Standing lumbar extension  1x10 Feels like good stretch No change    SPECIAL TESTS: Straight leg raise (SLR): L = postive for posterior thigh pain. FABER: R = negative, L = negative.  ACCESSORY MOTION:  - Hypomobile and painful to CPA at left thoracolumbar junction  PALPATION: - Mildly TTP over left SIJ and glute region compared to right.   EDUCATION/COGNITION: Patient is alert and oriented X 4.  Objective measurements completed on examination: See above findings.    TREATMENT:  Therapeutic exercise: to centralize symptoms and improve ROM, strength, muscular endurance, and activity tolerance required for successful completion of functional activities.  - prone press up x 10 - standing lumbar extension x 10 - posture correction with lumbar roll - Education on diagnosis, prognosis, POC, anatomy and physiology of current condition.  - Education on HEP including handout   HOME EXERCISE PROGRAM Access Code: [email or web address] URL: [email or web address] Date: 01/01/2021 Prepared by: Rosita Kea  Exercises Seated Correct Posture Standing Lumbar Extension - 4 x daily - 10-15 reps - 1 sets - 1 second hold      PT Education - 01/01/21 0926    Education Details Exercise purpose/form. Self management techniques. Education on diagnosis, prognosis, POC, anatomy and physiology of current condition Education on HEP including handout    Person(s)  Educated Patient    Methods Explanation;Demonstration;Tactile cues;Verbal cues;Handout    Comprehension Verbalized understanding;Returned demonstration;Verbal cues required;Tactile cues required;Need further instruction            PT Short Term Goals - 01/01/21 0940      PT SHORT TERM GOAL #1   Title Be independent with initial home exercise program  for self-management of symptoms.    Baseline initial HEP provided at IE (12/31/2020);    Time 2    Period Weeks    Status New    Target Date 01/15/21             PT Long Term Goals - 12/31/20 1559      PT LONG TERM GOAL #1   Title Be independent with a long-term home exercise program for self-management of symptoms.    Baseline initial HEP provided at IE (12/31/2020);    Time 12    Period Weeks    Status New   TARGET DATE FOR ALL LONG TERM GOALS: 03/25/2021     PT LONG TERM GOAL #2   Title Demonstrate improved FOTO score to equal or greater than 66 by visit #11 demonstrate improvement in overall condition and self-reported functional ability.    Baseline 56 (12/31/2020);    Time 12    Period Weeks    Status New      PT LONG TERM GOAL #3   Title Patient will complete 1000 feet during 6 Minute Walk Test to demonstrate improved community mobility.    Baseline to be tested on visit 2 (01/02/2020);    Time 12    Period Weeks    Status New      PT LONG TERM GOAL #4   Title Reduce pain with functional activities to equal or less than 1/10 to allow patient to complete usual activities including ADLs, IADLs, and social engagement with less difficulty.    Baseline 8-9/10 (12/31/2020);    Time 12    Period Weeks    Status New      PT LONG TERM GOAL #5   Title Complete community, work and/or recreational activities without limitation due to current condition.    Baseline Functional Limitations: bending, gardening, picking strawberries, walking, standing, cooking, walking up hill, etc (12/31/2020);    Time 12    Period Weeks    Status  New                  Plan - 01/01/21 0937    Clinical Impression Statement Patient is a 75 y.o. female referred to outpatient physical therapy with a medical diagnosis of low back pain who presents with signs and symptoms consistent with low back pain with left sided sciatica acute exacerbation on chronic episodic pain that leads to difficulty with walking and standing tasks, as well as bending, lifting, gardening, etc.. Patient presents with significant pain, ROM, muscle performance (strength/power/endurnace) activity tolerance, motor control, postural impairments that are limiting ability to complete her usual activities including bending, gardening, picking strawberries, walking, standing, cooking, walking up hill, etc without difficulty. Patient will benefit from skilled physical therapy intervention to address current body structure impairments and activity limitations to improve function and work towards goals set in current POC in order to return to prior level of function or maximal functional improvement.    Personal Factors and Comorbidities Age;Comorbidity 3+;Past/Current Experience;Fitness;Time since onset of injury/illness/exacerbation    Comorbidities Relevant past medical history and comorbidities include scoliosis,right upper back/shoulder region (referred to Dr. Sharlet Ryan), breast cancer in  2015 (takes a hormome therapy, followed by cancer center), fatigue, osteopenia, OSA, GED, abdominal hernia (repair 2016), shortness of breath after eating, hiatial hernia (supposed to see surgeon), dizziness (episodes in Aug 2021), HTN, see chart for additional history.    Examination-Activity Limitations Caring for Others;Carry;Locomotion Level;Stand;Stairs;Lift;Squat;Sleep;Transfers    Examination-Participation Restrictions Laundry;Cleaning;Community Activity;Yard Work;Interpersonal Relationship  bending, gardening, picking strawberries, walking, standing, cooking, walking up hill, etc    Stability/Clinical Decision Making Stable/Uncomplicated    Clinical Decision Making Low    Rehab Potential Good    PT Frequency 2x / week    PT Duration 12 weeks    PT Treatment/Interventions ADLs/Self Care Home Management;Cryotherapy;Moist Heat;Electrical Stimulation;Therapeutic activities;Therapeutic exercise;Balance training;Neuromuscular re-education;Patient/family education;Manual techniques;Dry needling;Passive range of motion;Joint Manipulations;Spinal Manipulations    PT Next Visit Plan test 6MWT, specific exercise and general strengthening    PT Home Exercise Plan Medbridge Access Code: [email or web address]    Consulted and Agree with Plan of Care Patient           Patient will benefit from skilled therapeutic intervention in order to improve the following deficits and impairments:  Improper body mechanics,Pain,Postural dysfunction,Increased muscle spasms,Decreased coordination,Decreased mobility,Decreased activity tolerance,Decreased endurance,Decreased range of motion,Decreased strength,Hypomobility,Impaired perceived functional ability,Difficulty walking,Impaired flexibility  Visit Diagnosis: Bilateral low back pain with left-sided sciatica, unspecified chronicity  Difficulty in walking, not elsewhere classified     Problem List Patient Active Problem List   Diagnosis Date Noted  . Has immunity to COVID-19 virus 09/18/2020  . Headache 08/22/2020  . Dizziness 08/22/2020  . Stress 04/20/2020  . Decreased GFR 03/11/2020  . Leg pain 11/04/2019  . Osteopenia 11/04/2018  . Rectal bleeding 06/27/2018  . Joint pain 06/08/2016  . Sleeping difficulty 04/06/2015  . SOB (shortness of breath) 02/02/2015  . Health care maintenance 02/02/2015  . Paraesophageal hiatal hernia 01/21/2015  . Esophageal reflux 01/21/2015  . Hernia of abdominal cavity 10/22/2014  . Back pain 10/13/2014  . Hyperglycemia 10/08/2014  . Abdominal hernia 10/08/2014  . Breast cancer ([place]) 07/09/2014  . Knee pain,  right 07/09/2014  . Obstructive sleep apnea 12/23/2013  . Chest pain 10/01/2013  . Hypertension 10/25/2012  . GERD (gastroesophageal reflux disease) 10/25/2012  . Hypercholesterolemia 10/25/2012  . Allergic rhinitis 10/25/2012    Everlean Alstrom. Graylon Good, PT, DPT 01/01/21, 9:46 AM  [facility] [address] [place], [address] Phone: [phone]   Fax:  [phone]  Name: SKARLETTE WIETECHA MRN: [id number] Date of Birth: [birth year]

## 2021-01-01 DIAGNOSIS — G4733 Obstructive sleep apnea (adult) (pediatric): Secondary | ICD-10-CM | POA: Diagnosis not present

## 2021-01-08 ENCOUNTER — Encounter: Payer: Medicare HMO | Admitting: Physical Therapy

## 2021-01-13 ENCOUNTER — Ambulatory Visit: Payer: Medicare HMO | Admitting: Physical Therapy

## 2021-01-13 ENCOUNTER — Other Ambulatory Visit: Payer: Self-pay

## 2021-01-13 DIAGNOSIS — M5442 Lumbago with sciatica, left side: Secondary | ICD-10-CM

## 2021-01-13 DIAGNOSIS — R262 Difficulty in walking, not elsewhere classified: Secondary | ICD-10-CM

## 2021-01-13 NOTE — Therapy (Signed)
[facility] [address] [place], [address] Phone: [phone]   Fax:  [phone]  Physical Therapy Treatment  Patient Details  Name: Carla Ryan MRN: [id number] Date of Birth: 03-Jul-1946 Referring Provider (PT): Einar Pheasant, MD   Encounter Date: 01/13/2021   PT End of Session - 01/13/21 1050    Visit Number 2    Number of Visits 24    Date for PT Re-Evaluation 03/25/21    Authorization Type [employer] reporting period from 12/31/2020    Authorization Time Period Carla Ryan #[id number] 1/25-3/4 12 PT visits    Authorization - Visit Number 1    Authorization - Number of Visits 12    Progress Note Due on Visit 10    PT Start Time 0947    PT Stop Time 1030    PT Time Calculation (min) 43 min    Activity Tolerance Patient tolerated treatment well    Behavior During Therapy [facility] for tasks assessed/performed           Past Medical History:  Diagnosis Date  . Abdominal hernia   . Anxiety    situational stress and anxiety  . Cancer ([place]) 2015   breast   . Cataract cortical, senile, unspecified laterality   . Chest pain   . Exertional dyspnea   . GERD (gastroesophageal reflux disease)   . Hiatal hernia   . Hiatal hernia   . Hypercholesterolemia   . Hyperlipidemia   . Hypertension   . Osteoarthritis    scoliosis, degenerative disc dz, knee, carpal tunnel  . Sleep apnea     Past Surgical History:  Procedure Laterality Date  . ABDOMINAL HYSTERECTOMY  07/2000  . APPENDECTOMY  2000  . APPENDECTOMY    . BREAST BIOPSY Bilateral 2014  . BREAST SURGERY Right 2015   lumpectomy  . CARPAL TUNNEL RELEASE    . COLONOSCOPY  2011   Carla Ryan, [name]: Normal exam  . COLONOSCOPY WITH PROPOFOL N/A 04/03/2020   Procedure: COLONOSCOPY WITH PROPOFOL;  Surgeon: Toledo, Benay Pike, MD;  Location: [facility] ENDOSCOPY;  Service: Gastroenterology;  Laterality: N/A;  . ESOPHAGOGASTRODUODENOSCOPY (EGD) WITH  PROPOFOL N/A 04/03/2020   Procedure: ESOPHAGOGASTRODUODENOSCOPY (EGD) WITH PROPOFOL;  Surgeon: Toledo, Benay Pike, MD;  Location: [facility] ENDOSCOPY;  Service: Gastroenterology;  Laterality: N/A;  . HERNIA REPAIR  02/10/15  . Laparoscopic Repair of Lateral Abdominal Wall Hernia  02/10/15  . MASTECTOMY    . UPPER GI ENDOSCOPY  05/10/2014   Carla Ryan, [name] Bile gastritis, medium hiatal hernia. Incomplete visualization of the cardia and fundus.    There were no vitals filed for this visit.   Subjective Assessment - 01/13/21 0949    Subjective Patient reports her left leg continues to bother her in the morning and better later. She currently has 2/10 pain in the low back and down the left leg lateral to anterior knee. Reports she has been doing standing lumbar extension multiple sets per day and it does not seem to help or make her feel worse. It is least comfortable in the morning.    Pertinent History Patient is a 75 y.o. female who presents to outpatient physical therapy with a referral for medical diagnosis low back pain. This patient's chief complaints consist of episodic low back pain with most recent episode starting Nov/Dec 2021, radiation down left leg above knee, leading to the following functional deficits: difficulty with walking, bending, gardening, picking strawberries, walking, standing, cooking, walking up  hill, etc.  Relevant past medical history and comorbidities include scoliosis,right upper back/shoulder region (referred to Dr. Sharlet Salina), breast cancer in  2015 (takes a hormome therapy, followed by cancer center), fatigue, osteopenia, OSA, GED, abdominal hernia (repair 2016), shortness of breath after eating, hiatial hernia (supposed to see surgeon), dizziness (episodes in Aug 2021), HTN, see chart for additional history.  Patient denies hx of stroke, seizures, lung problem, major cardiac events, diabetes, unexplained weight loss, changes in bowel or bladder problems, new onset stumbling or  dropping things.    Limitations Standing;Walking;Lifting;House hold activities    How long can you sit comfortably? doesn't bother her much    How long can you stand comfortably? 30-60 min in the kitchen but it hurts    How long can you walk comfortably? 10-15 min    Diagnostic tests no recent imaging  Lumbar/thoracic MRI report from 09/09/2014: "Impression:   1. Severe levoscoliosis of the thoracolumbar spine, centered at L1-L2. No   canal or foraminal stenosis in the thoracic spine. Multilevel degenerative   disc disease in the lumbar spine without advanced canal or foraminal   stenosis.   2. No pathologic marrow signal noted. "    Currently in Pain? Yes    Pain Score 2     Pain Onset --          OBJECTIVE 6 Minute Walk Test: 1350 feet with development of pain in left leg in familiar area.    TREATMENT:  Therapeutic exercise:to centralize symptoms and improve ROM, strength, muscular endurance, and activity tolerance required for successful completion of functional activities.  - seated lumbar flexion with theraball, x 2 min (increased pain at left glute).  - standing lumbar extension x 5 (good form) - ambulation around clinic for  distance over 6 min (6MWT) to assess walking tolerance and warm up tissues for further exercise and intervention. 1350 feet with development of pain in left leg in familiar area.  - prone multifidus kick, 3x10 each side - supine left sciatic nerve floss x 15 - supine left figure 4 hip external rotator stretch, L side, 1x20 seconds, 2x15 seconds (hard on arms to hold).  - Education on HEP including handout  Manual therapy: to reduce pain and tissue tension, improve range of motion, neuromodulation, in order to promote improved ability to complete functional activities. - prone STM to left lumbar paraspinals and QL and left glute and deep hip external rotator region to decrease pain.  - CPA grade II along lower thoracic and entire lumbar spine as tolerated.    HOME EXERCISE PROGRAM Access Code: [email or web address] URL: [email or web address] Date: 01/13/2021 Prepared by: Rosita Kea  Exercises Prone Hip Extension - 1 x daily - 3 sets - 10 reps - 5 seconds hold Supine 90/90 Sciatic Nerve Glide with Knee Flexion/Extension - 1 x daily - 3 sets - 10 reps - 1 second hold Figure 4 stretch - 1 x daily - 3 reps - 15 hold    PT Education - 01/13/21 1053    Education Details Exercise purpose/form. Self management techniques.    Person(s) Educated Patient    Methods Explanation;Demonstration;Tactile cues;Verbal cues;Handout    Comprehension Verbalized understanding;Returned demonstration;Verbal cues required;Tactile cues required;Need further instruction            PT Short Term Goals - 01/13/21 1046      PT SHORT TERM GOAL #1   Title Be independent with initial home exercise program for self-management of symptoms.    Baseline initial HEP provided  at IE (12/31/2020);    Time 2    Period Weeks    Status Achieved    Target Date 01/15/21             PT Long Term Goals - 01/13/21 1046      PT LONG TERM GOAL #1   Title Be independent with a long-term home exercise program for self-management of symptoms.    Baseline initial HEP provided at IE (12/31/2020);    Time 12    Period Weeks    Status Partially Met   TARGET DATE FOR ALL LONG TERM GOALS: 03/25/2021     PT LONG TERM GOAL #2   Title Demonstrate improved FOTO score to equal or greater than 66 by visit #11 demonstrate improvement in overall condition and self-reported functional ability.    Baseline 56 (12/31/2020);    Time 12    Period Weeks    Status On-going      PT LONG TERM GOAL #3   Title Patient will complete 1000 feet during 6 Minute Walk Test to demonstrate improved community mobility.    Baseline to be tested on visit 2 (01/02/2020); 1350 with pain (01/13/2021);    Time 12    Period Weeks    Status Achieved      PT LONG TERM GOAL #4   Title Reduce pain with  functional activities to equal or less than 1/10 to allow patient to complete usual activities including ADLs, IADLs, and social engagement with less difficulty.    Baseline 8-9/10 (12/31/2020);    Time 12    Period Weeks    Status On-going      PT LONG TERM GOAL #5   Title Complete community, work and/or recreational activities without limitation due to current condition.    Baseline Functional Limitations: bending, gardening, picking strawberries, walking, standing, cooking, walking up hill, etc (12/31/2020);    Time 12    Period Weeks    Status On-going                 Plan - 01/13/21 1045    Clinical Impression Statement Patient tolerated treatment well overall with no overall change in pain by end of session.  Completed 6MWT with excellent speed and distance, but reported she was not sure she could continue any longer after 6 min due to pain in the left low back and thigh. This resolved within 5 min of seated rest. Updated HEP to include exercises targeting segmental back strength/endurance, neurodynamics, and tightness in the glute region. Patient would benefit from continued management of limiting condition by skilled physical therapist to address remaining impairments and functional limitations to work towards stated goals and return to PLOF or maximal functional independence.    Personal Factors and Comorbidities Age;Comorbidity 3+;Past/Current Experience;Fitness;Time since onset of injury/illness/exacerbation    Comorbidities Relevant past medical history and comorbidities include scoliosis,right upper back/shoulder region (referred to Dr. Sharlet Salina), breast cancer in  2015 (takes a hormome therapy, followed by cancer center), fatigue, osteopenia, OSA, GED, abdominal hernia (repair 2016), shortness of breath after eating, hiatial hernia (supposed to see surgeon), dizziness (episodes in Aug 2021), HTN, see chart for additional history.    Examination-Activity Limitations Caring for  Others;Carry;Locomotion Level;Stand;Stairs;Lift;Squat;Sleep;Transfers    Examination-Participation Restrictions Laundry;Cleaning;Community Activity;Yard Work;Interpersonal Relationship   bending, gardening, picking strawberries, walking, standing, cooking, walking up hill, etc   Stability/Clinical Decision Making Stable/Uncomplicated    Rehab Potential Good    PT Frequency 2x / week    PT Duration  12 weeks    PT Treatment/Interventions ADLs/Self Care Home Management;Cryotherapy;Moist Heat;Electrical Stimulation;Therapeutic activities;Therapeutic exercise;Balance training;Neuromuscular re-education;Patient/family education;Manual techniques;Dry needling;Passive range of motion;Joint Manipulations;Spinal Manipulations    PT Next Visit Plan strengthening and muscular endurance exercise, neurodynamics, functional strengthening, manual as needed    PT Home Exercise Plan Medbridge Access Code: [email or web address]    Consulted and Agree with Plan of Care Patient           Patient will benefit from skilled therapeutic intervention in order to improve the following deficits and impairments:  Improper body mechanics,Pain,Postural dysfunction,Increased muscle spasms,Decreased coordination,Decreased mobility,Decreased activity tolerance,Decreased endurance,Decreased range of motion,Decreased strength,Hypomobility,Impaired perceived functional ability,Difficulty walking,Impaired flexibility  Visit Diagnosis: Bilateral low back pain with left-sided sciatica, unspecified chronicity  Difficulty in walking, not elsewhere classified     Problem List Patient Active Problem List   Diagnosis Date Noted  . Has immunity to COVID-19 virus 09/18/2020  . Headache 08/22/2020  . Dizziness 08/22/2020  . Stress 04/20/2020  . Decreased GFR 03/11/2020  . Leg pain 11/04/2019  . Osteopenia 11/04/2018  . Rectal bleeding 06/27/2018  . Joint pain 06/08/2016  . Sleeping difficulty 04/06/2015  . SOB (shortness of breath)  02/02/2015  . Health care maintenance 02/02/2015  . Paraesophageal hiatal hernia 01/21/2015  . Esophageal reflux 01/21/2015  . Hernia of abdominal cavity 10/22/2014  . Back pain 10/13/2014  . Hyperglycemia 10/08/2014  . Abdominal hernia 10/08/2014  . Breast cancer ([place]) 07/09/2014  . Knee pain, right 07/09/2014  . Obstructive sleep apnea 12/23/2013  . Chest pain 10/01/2013  . Hypertension 10/25/2012  . GERD (gastroesophageal reflux disease) 10/25/2012  . Hypercholesterolemia 10/25/2012  . Allergic rhinitis 10/25/2012    Everlean Alstrom. Graylon Good, PT, DPT 01/13/21, 10:53 AM  [facility] [address] [place], [address] Phone: [phone]   Fax:  [phone]  Name: DEBRAANN LIVINGSTONE MRN: [id number] Date of Birth: Dec 22, 1945

## 2021-01-20 ENCOUNTER — Ambulatory Visit: Payer: Medicare HMO | Admitting: Physical Therapy

## 2021-01-22 ENCOUNTER — Ambulatory Visit: Payer: Medicare HMO | Admitting: Physical Therapy

## 2021-01-26 DIAGNOSIS — H8101 Meniere's disease, right ear: Secondary | ICD-10-CM | POA: Diagnosis not present

## 2021-01-27 ENCOUNTER — Ambulatory Visit: Payer: Medicare HMO | Attending: Internal Medicine | Admitting: Physical Therapy

## 2021-01-27 ENCOUNTER — Other Ambulatory Visit: Payer: Self-pay

## 2021-01-27 ENCOUNTER — Encounter: Payer: Self-pay | Admitting: Physical Therapy

## 2021-01-27 DIAGNOSIS — M5442 Lumbago with sciatica, left side: Secondary | ICD-10-CM | POA: Diagnosis not present

## 2021-01-27 DIAGNOSIS — R262 Difficulty in walking, not elsewhere classified: Secondary | ICD-10-CM

## 2021-01-27 NOTE — Therapy (Signed)
[facility] [address], [place], [address] Phone: [phone]   Fax:  [phone]  Physical Therapy Treatment  Patient Details  Name: Carla Ryan MRN: [id number] Date of Birth: Apr 20, 1946 Referring Provider (PT): Einar Pheasant, MD   Encounter Date: 01/27/2021   PT End of Session - 01/27/21 1127    Visit Number 3    Number of Visits 24    Date for PT Re-Evaluation 03/25/21    Authorization Type [employer] reporting period from 12/31/2020    Authorization Time Period [employer] #[id number] 1/25-3/4 12 PT visits    Authorization - Visit Number 2    Authorization - Number of Visits 12    Progress Note Due on Visit 10    PT Start Time 0948    PT Stop Time 1026    PT Time Calculation (min) 38 min    Activity Tolerance Patient tolerated treatment well    Behavior During Therapy [facility] for tasks assessed/performed           Past Medical History:  Diagnosis Date  . Abdominal hernia   . Anxiety    situational stress and anxiety  . Cancer ([place]) 2015   breast   . Cataract cortical, senile, unspecified laterality   . Chest pain   . Exertional dyspnea   . GERD (gastroesophageal reflux disease)   . Hiatal hernia   . Hiatal hernia   . Hypercholesterolemia   . Hyperlipidemia   . Hypertension   . Osteoarthritis    scoliosis, degenerative disc dz, knee, carpal tunnel  . Sleep apnea     Past Surgical History:  Procedure Laterality Date  . ABDOMINAL HYSTERECTOMY  07/2000  . APPENDECTOMY  2000  . APPENDECTOMY    . BREAST BIOPSY Bilateral 2014  . BREAST SURGERY Right 2015   lumpectomy  . CARPAL TUNNEL RELEASE    . COLONOSCOPY  2011   [name], [name]: Normal exam  . COLONOSCOPY WITH PROPOFOL N/A 04/03/2020   Procedure: COLONOSCOPY WITH PROPOFOL;  Surgeon: Toledo, Benay Pike, MD;  Location: [facility] ENDOSCOPY;  Service: Gastroenterology;  Laterality: N/A;  . ESOPHAGOGASTRODUODENOSCOPY (EGD) WITH  PROPOFOL N/A 04/03/2020   Procedure: ESOPHAGOGASTRODUODENOSCOPY (EGD) WITH PROPOFOL;  Surgeon: Toledo, Benay Pike, MD;  Location: [facility] ENDOSCOPY;  Service: Gastroenterology;  Laterality: N/A;  . HERNIA REPAIR  02/10/15  . Laparoscopic Repair of Lateral Abdominal Wall Hernia  02/10/15  . MASTECTOMY    . UPPER GI ENDOSCOPY  05/10/2014   [name], [name] Bile gastritis, medium hiatal hernia. Incomplete visualization of the cardia and fundus.    There were no vitals filed for this visit.   Subjective Assessment - 01/27/21 0953    Subjective Patient reports she has some difficulty with her exercises at home. She was having more pain down the leg when she tried to do her exercises the day after her last session.  She felt pretty sore the next day in the region of usual pain. She tried to do her HEP again Friday and got to the second exercise before her symptoms started flairing up. She then had two deaths in the family and has been out dealing with that and did not do her HEP during that time. Reports current pain 3/10 in the left low back down to the lateral L knee.    Pertinent History Patient is a 75 y.o. female who presents to outpatient physical therapy with a referral for medical diagnosis low back pain.  This patient's chief complaints consist of episodic low back pain with most recent episode starting Nov/Dec 2021, radiation down left leg above knee, leading to the following functional deficits: difficulty with walking, bending, gardening, picking strawberries, walking, standing, cooking, walking up hill, etc.  Relevant past medical history and comorbidities include scoliosis,right upper back/shoulder region (referred to Dr. Sharlet Salina), breast cancer in  2015 (takes a hormome therapy, followed by cancer center), fatigue, osteopenia, OSA, GED, abdominal hernia (repair 2016), shortness of breath after eating, hiatial hernia (supposed to see surgeon), dizziness (episodes in Aug 2021), HTN, see chart for  additional history.  Patient denies hx of stroke, seizures, lung problem, major cardiac events, diabetes, unexplained weight loss, changes in bowel or bladder problems, new onset stumbling or dropping things.    Limitations Standing;Walking;Lifting;House hold activities    How long can you sit comfortably? doesn't bother her much    How long can you stand comfortably? 30-60 min in the kitchen but it hurts    How long can you walk comfortably? 10-15 min    Diagnostic tests no recent imaging  Lumbar/thoracic MRI report from 09/09/2014: "Impression:   1. Severe levoscoliosis of the thoracolumbar spine, centered at L1-L2. No   canal or foraminal stenosis in the thoracic spine. Multilevel degenerative   disc disease in the lumbar spine without advanced canal or foraminal   stenosis.   2. No pathologic marrow signal noted. "    Currently in Pain? Yes    Pain Score 3             TREATMENT: Therapeutic exercise:to centralize symptoms and improve ROM, strength, muscular endurance, and activity tolerance required for successful completion of functional activities. - standing lumbar extension x 10 (painful in knee each rep, no worse) - NuStep level 0 using bilateral upper and lower extremities. Seat/handle setting 5/7. For improved extremity mobility, muscular endurance, and activity tolerance; and to induce the analgesic effect of aerobic exercise, stimulate improved joint nutrition, and prepare body structures and systems for following interventions. x 5  minutes. Average SPM = 63 (relief of pain with sitting).  - hooklying lower trunk rotation x 20 (mild pull on right side) - hooklying double knees to chest with green theraball under feet x 20 - hooklying abdominal brace with marching, 2x10 each side - Education on HEP including handout  Manual therapy: to reduce pain and tissue tension, improve range of motion, neuromodulation, in order to promote improved ability to complete functional  activities. -Supine long axis distraction through L LE, increased pain gradually, discontinued.    HOME EXERCISE PROGRAM Access Code: [email or web address] URL: [email or web address] Date: 01/27/2021 Prepared by: Rosita Kea  Exercises Supine Lower Trunk Rotation - 1 x daily - 1 sets - 20 reps Supine Transversus Abdominis Bracing - Hands on Stomach - 1 x daily - 2 sets - 10 reps - 5 seconds hold Supine March with Posterior Pelvic Tilt - 1 x daily - 2 sets - 10 reps - 5 seconds hold    PT Education - 01/27/21 1127    Education Details Exercise purpose/form. Self management techniques.    Person(s) Educated Patient    Methods Explanation;Demonstration;Tactile cues;Verbal cues;Handout    Comprehension Verbalized understanding;Returned demonstration;Verbal cues required;Tactile cues required;Need further instruction            PT Short Term Goals - 01/13/21 1046      PT SHORT TERM GOAL #1   Title Be independent with initial home exercise program for self-management of symptoms.  Baseline initial HEP provided at IE (12/31/2020);    Time 2    Period Weeks    Status Achieved    Target Date 01/15/21             PT Long Term Goals - 01/13/21 1046      PT LONG TERM GOAL #1   Title Be independent with a long-term home exercise program for self-management of symptoms.    Baseline initial HEP provided at IE (12/31/2020);    Time 12    Period Weeks    Status Partially Met   TARGET DATE FOR ALL LONG TERM GOALS: 03/25/2021     PT LONG TERM GOAL #2   Title Demonstrate improved FOTO score to equal or greater than 66 by visit #11 demonstrate improvement in overall condition and self-reported functional ability.    Baseline 56 (12/31/2020);    Time 12    Period Weeks    Status On-going      PT LONG TERM GOAL #3   Title Patient will complete 1000 feet during 6 Minute Walk Test to demonstrate improved community mobility.    Baseline to be tested on visit 2 (01/02/2020); 1350 with  pain (01/13/2021);    Time 12    Period Weeks    Status Achieved      PT LONG TERM GOAL #4   Title Reduce pain with functional activities to equal or less than 1/10 to allow patient to complete usual activities including ADLs, IADLs, and social engagement with less difficulty.    Baseline 8-9/10 (12/31/2020);    Time 12    Period Weeks    Status On-going      PT LONG TERM GOAL #5   Title Complete community, work and/or recreational activities without limitation due to current condition.    Baseline Functional Limitations: bending, gardening, picking strawberries, walking, standing, cooking, walking up hill, etc (12/31/2020);    Time 12    Period Weeks    Status On-going                 Plan - 01/27/21 1031    Clinical Impression Statement Patient tolerated treatment well overall and reported feeling slightly better by end of session. Updated HEP to exercises well tolerated in the clinic today. Patient lacks lumbopelvic control and ability to activate lower abdominal muscles well but improved with practice and cuing. Patient would benefit from continued management of limiting condition by skilled physical therapist to address remaining impairments and functional limitations to work towards stated goals and return to PLOF or maximal functional independence.    Personal Factors and Comorbidities Age;Comorbidity 3+;Past/Current Experience;Fitness;Time since onset of injury/illness/exacerbation    Comorbidities Relevant past medical history and comorbidities include scoliosis,right upper back/shoulder region (referred to Dr. Sharlet Salina), breast cancer in  2015 (takes a hormome therapy, followed by cancer center), fatigue, osteopenia, OSA, GED, abdominal hernia (repair 2016), shortness of breath after eating, hiatial hernia (supposed to see surgeon), dizziness (episodes in Aug 2021), HTN, see chart for additional history.    Examination-Activity Limitations Caring for Others;Carry;Locomotion  Level;Stand;Stairs;Lift;Squat;Sleep;Transfers    Examination-Participation Restrictions Laundry;Cleaning;Community Activity;Yard Work;Interpersonal Relationship   bending, gardening, picking strawberries, walking, standing, cooking, walking up hill, etc   Stability/Clinical Decision Making Stable/Uncomplicated    Rehab Potential Good    PT Frequency 2x / week    PT Duration 12 weeks    PT Treatment/Interventions ADLs/Self Care Home Management;Cryotherapy;Moist Heat;Electrical Stimulation;Therapeutic activities;Therapeutic exercise;Balance training;Neuromuscular re-education;Patient/family education;Manual techniques;Dry needling;Passive range of motion;Joint Manipulations;Spinal Manipulations  PT Next Visit Plan strengthening and muscular endurance exercise, neurodynamics, functional strengthening, manual as needed    PT Home Exercise Plan Medbridge Access Code: [email or web address]    Consulted and Agree with Plan of Care Patient           Patient will benefit from skilled therapeutic intervention in order to improve the following deficits and impairments:  Improper body mechanics,Pain,Postural dysfunction,Increased muscle spasms,Decreased coordination,Decreased mobility,Decreased activity tolerance,Decreased endurance,Decreased range of motion,Decreased strength,Hypomobility,Impaired perceived functional ability,Difficulty walking,Impaired flexibility  Visit Diagnosis: Bilateral low back pain with left-sided sciatica, unspecified chronicity  Difficulty in walking, not elsewhere classified     Problem List Patient Active Problem List   Diagnosis Date Noted  . Has immunity to COVID-19 virus 09/18/2020  . Headache 08/22/2020  . Dizziness 08/22/2020  . Stress 04/20/2020  . Decreased GFR 03/11/2020  . Leg pain 11/04/2019  . Osteopenia 11/04/2018  . Rectal bleeding 06/27/2018  . Joint pain 06/08/2016  . Sleeping difficulty 04/06/2015  . SOB (shortness of breath) 02/02/2015  . Health care  maintenance 02/02/2015  . Paraesophageal hiatal hernia 01/21/2015  . Esophageal reflux 01/21/2015  . Hernia of abdominal cavity 10/22/2014  . Back pain 10/13/2014  . Hyperglycemia 10/08/2014  . Abdominal hernia 10/08/2014  . Breast cancer ([place]) 07/09/2014  . Knee pain, right 07/09/2014  . Obstructive sleep apnea 12/23/2013  . Chest pain 10/01/2013  . Hypertension 10/25/2012  . GERD (gastroesophageal reflux disease) 10/25/2012  . Hypercholesterolemia 10/25/2012  . Allergic rhinitis 10/25/2012    Everlean Alstrom. Graylon Good, PT, DPT 01/27/21, 11:29 AM  [facility] [address], [place], [address] Phone: [phone]   Fax:  [phone]  Name: Carla Ryan MRN: [id number] Date of Birth: 1946-11-30

## 2021-01-28 ENCOUNTER — Other Ambulatory Visit: Payer: Self-pay | Admitting: Internal Medicine

## 2021-01-29 ENCOUNTER — Encounter: Payer: Self-pay | Admitting: Physical Therapy

## 2021-01-29 ENCOUNTER — Other Ambulatory Visit: Payer: Self-pay | Admitting: Physical Medicine and Rehabilitation

## 2021-01-29 ENCOUNTER — Ambulatory Visit: Payer: Medicare HMO | Admitting: Physical Therapy

## 2021-01-29 ENCOUNTER — Other Ambulatory Visit: Payer: Self-pay

## 2021-01-29 DIAGNOSIS — M5030 Other cervical disc degeneration, unspecified cervical region: Secondary | ICD-10-CM | POA: Diagnosis not present

## 2021-01-29 DIAGNOSIS — K449 Diaphragmatic hernia without obstruction or gangrene: Secondary | ICD-10-CM | POA: Diagnosis not present

## 2021-01-29 DIAGNOSIS — M5412 Radiculopathy, cervical region: Secondary | ICD-10-CM | POA: Diagnosis not present

## 2021-01-29 DIAGNOSIS — M5442 Lumbago with sciatica, left side: Secondary | ICD-10-CM | POA: Diagnosis not present

## 2021-01-29 DIAGNOSIS — G4733 Obstructive sleep apnea (adult) (pediatric): Secondary | ICD-10-CM | POA: Diagnosis not present

## 2021-01-29 DIAGNOSIS — K219 Gastro-esophageal reflux disease without esophagitis: Secondary | ICD-10-CM | POA: Diagnosis not present

## 2021-01-29 DIAGNOSIS — R262 Difficulty in walking, not elsewhere classified: Secondary | ICD-10-CM | POA: Diagnosis not present

## 2021-01-29 NOTE — Therapy (Signed)
[facility] [address], [place], [address] Phone: [phone]   Fax:  [phone]  Physical Therapy Treatment  Patient Details  Name: Carla Ryan MRN: [id number] Date of Birth: June 03, 1946 Referring Provider (PT): Einar Pheasant, MD   Encounter Date: 01/29/2021   PT End of Session - 01/29/21 0945    Visit Number 4    Number of Visits 24    Date for PT Re-Evaluation 03/25/21    Authorization Type [employer] reporting period from 12/31/2020    Authorization Time Period [employer] #[id number] 1/25-3/4 12 PT visits    Authorization - Visit Number 3    Authorization - Number of Visits 12    Progress Note Due on Visit 10    PT Start Time 0945    PT Stop Time 1025    PT Time Calculation (min) 40 min    Activity Tolerance Patient tolerated treatment well    Behavior During Therapy [facility] for tasks assessed/performed           Past Medical History:  Diagnosis Date  . Abdominal hernia   . Anxiety    situational stress and anxiety  . Cancer ([place]) 2015   breast   . Cataract cortical, senile, unspecified laterality   . Chest pain   . Exertional dyspnea   . GERD (gastroesophageal reflux disease)   . Hiatal hernia   . Hiatal hernia   . Hypercholesterolemia   . Hyperlipidemia   . Hypertension   . Osteoarthritis    scoliosis, degenerative disc dz, knee, carpal tunnel  . Sleep apnea     Past Surgical History:  Procedure Laterality Date  . ABDOMINAL HYSTERECTOMY  07/2000  . APPENDECTOMY  2000  . APPENDECTOMY    . BREAST BIOPSY Bilateral 2014  . BREAST SURGERY Right 2015   lumpectomy  . CARPAL TUNNEL RELEASE    . COLONOSCOPY  2011   [name], [name]: Normal exam  . COLONOSCOPY WITH PROPOFOL N/A 04/03/2020   Procedure: COLONOSCOPY WITH PROPOFOL;  Surgeon: Toledo, Benay Pike, MD;  Location: [facility] ENDOSCOPY;  Service: Gastroenterology;  Laterality: N/A;  . ESOPHAGOGASTRODUODENOSCOPY (EGD) WITH  PROPOFOL N/A 04/03/2020   Procedure: ESOPHAGOGASTRODUODENOSCOPY (EGD) WITH PROPOFOL;  Surgeon: Toledo, Benay Pike, MD;  Location: [facility] ENDOSCOPY;  Service: Gastroenterology;  Laterality: N/A;  . HERNIA REPAIR  02/10/15  . Laparoscopic Repair of Lateral Abdominal Wall Hernia  02/10/15  . MASTECTOMY    . UPPER GI ENDOSCOPY  05/10/2014   [name], [name] Bile gastritis, medium hiatal hernia. Incomplete visualization of the cardia and fundus.    There were no vitals filed for this visit.   Subjective Assessment - 01/29/21 0948    Subjective Patient reports she is feeling well this morning with no pain upon arrival. She last had pain this morning up to 8/10 in the left knee radiating from the back. She had to sit down after standing to do her hair and while doing morning activities in the kitchen. States she did her HEP with good tolerance and felt okay after last treatment session.    Pertinent History Patient is a 75 y.o. female who presents to outpatient physical therapy with a referral for medical diagnosis low back pain. This patient's chief complaints consist of episodic low back pain with most recent episode starting Nov/Dec 2021, radiation down left leg above knee, leading to the following functional deficits: difficulty with walking, bending, gardening, picking strawberries, walking, standing, cooking, walking up  hill, etc.  Relevant past medical history and comorbidities include scoliosis,right upper back/shoulder region (referred to Dr. Sharlet Salina), breast cancer in  2015 (takes a hormome therapy, followed by cancer center), fatigue, osteopenia, OSA, GED, abdominal hernia (repair 2016), shortness of breath after eating, hiatial hernia (supposed to see surgeon), dizziness (episodes in Aug 2021), HTN, see chart for additional history.  Patient denies hx of stroke, seizures, lung problem, major cardiac events, diabetes, unexplained weight loss, changes in bowel or bladder problems, new onset stumbling  or dropping things.    Limitations Standing;Walking;Lifting;House hold activities    How long can you sit comfortably? doesn't bother her much    How long can you stand comfortably? 30-60 min in the kitchen but it hurts    How long can you walk comfortably? 10-15 min    Diagnostic tests no recent imaging  Lumbar/thoracic MRI report from 09/09/2014: "Impression:   1. Severe levoscoliosis of the thoracolumbar spine, centered at L1-L2. No   canal or foraminal stenosis in the thoracic spine. Multilevel degenerative   disc disease in the lumbar spine without advanced canal or foraminal   stenosis.   2. No pathologic marrow signal noted. "    Currently in Pain? No/denies            TREATMENT: Therapeutic exercise:to centralize symptoms and improve ROM, strength, muscular endurance, and activity tolerance required for successfulcompletion of functional activities. - NuStep level 2 using bilateral upper and lower extremities. Seat/handle setting 5/7. For improved extremity mobility, muscular endurance, and activity tolerance; and to induce the analgesic effect of aerobic exercise, stimulate improved joint nutrition, and prepare body structures and systems for following interventions. x 6  minutes. Average SPM = 75 (mild R knee discomfort at end).  - hooklying lower trunk rotation x 20 (mild pull on the right), x20 with green theraball under legs. Cuing for abdominal control.  - hooklying double knees to chest with green theraball under feet x 20. Cuing for abdominal control.  - hooklying abdominal brace with marching, 1x10 each side - hooklying abdominal brace with alternating LE extension horizontally, 1x10 - hooklying modified dead bug with stick held over chest, 1x10 with practice with just arms prior to adding legs.  - "Pilates 100" in hooklying (sustained cruch with arm beats while breathing), 3x20 hand beats.  - hooklying abdominal brace with sequential hip flexion to table top and back down,  2x10 each set leading with a different LE. - prone abdominal brace with doubled over pillow under hips, 1x10, 5 second holds. Tactile cuing at abdominals. - prone abdominal brace with multifidus kick with doubled over pillow under hips, 2x10 each side. Tactile cuing at abdominals. - review of HEP  HOME EXERCISE PROGRAM Access Code: [email or web address] URL: [email or web address] Date: 01/27/2021 Prepared by: Rosita Kea  Exercises Supine Lower Trunk Rotation - 1 x daily - 1 sets - 20 reps Supine Transversus Abdominis Bracing - Hands on Stomach - 1 x daily - 2 sets - 10 reps - 5 seconds hold Supine March with Posterior Pelvic Tilt - 1 x daily - 2 sets - 10 reps - 5 seconds hold    PT Education - 01/29/21 0951    Education Details Exercise purpose/form. Self management techniques    Person(s) Educated Patient    Methods Explanation;Demonstration;Tactile cues;Verbal cues    Comprehension Verbalized understanding;Returned demonstration;Verbal cues required;Tactile cues required;Need further instruction            PT Short Term Goals - 01/13/21 1046  PT SHORT TERM GOAL #1   Title Be independent with initial home exercise program for self-management of symptoms.    Baseline initial HEP provided at IE (12/31/2020);    Time 2    Period Weeks    Status Achieved    Target Date 01/15/21             PT Long Term Goals - 01/13/21 1046      PT LONG TERM GOAL #1   Title Be independent with a long-term home exercise program for self-management of symptoms.    Baseline initial HEP provided at IE (12/31/2020);    Time 12    Period Weeks    Status Partially Met   TARGET DATE FOR ALL LONG TERM GOALS: 03/25/2021     PT LONG TERM GOAL #2   Title Demonstrate improved FOTO score to equal or greater than 66 by visit #11 demonstrate improvement in overall condition and self-reported functional ability.    Baseline 56 (12/31/2020);    Time 12    Period Weeks    Status On-going       PT LONG TERM GOAL #3   Title Patient will complete 1000 feet during 6 Minute Walk Test to demonstrate improved community mobility.    Baseline to be tested on visit 2 (01/02/2020); 1350 with pain (01/13/2021);    Time 12    Period Weeks    Status Achieved      PT LONG TERM GOAL #4   Title Reduce pain with functional activities to equal or less than 1/10 to allow patient to complete usual activities including ADLs, IADLs, and social engagement with less difficulty.    Baseline 8-9/10 (12/31/2020);    Time 12    Period Weeks    Status On-going      PT LONG TERM GOAL #5   Title Complete community, work and/or recreational activities without limitation due to current condition.    Baseline Functional Limitations: bending, gardening, picking strawberries, walking, standing, cooking, walking up hill, etc (12/31/2020);    Time 12    Period Weeks    Status On-going                 Plan - 01/29/21 1031    Clinical Impression Statement Patient tolerated treatment with some mild increased discomfort over the R glute that she described as "burning." Her chief complaint has historically been on the left lumbar to LE region. Will continue to monitor at future sessions. Continued to work on core muscle endurance and coordination. Patient would benefit from continued management of limiting condition by skilled physical therapist to address remaining impairments and functional limitations to work towards stated goals and return to PLOF or maximal functional independence.    Personal Factors and Comorbidities Age;Comorbidity 3+;Past/Current Experience;Fitness;Time since onset of injury/illness/exacerbation    Comorbidities Relevant past medical history and comorbidities include scoliosis,right upper back/shoulder region (referred to Dr. Sharlet Salina), breast cancer in  2015 (takes a hormome therapy, followed by cancer center), fatigue, osteopenia, OSA, GED, abdominal hernia (repair 2016), shortness of breath  after eating, hiatial hernia (supposed to see surgeon), dizziness (episodes in Aug 2021), HTN, see chart for additional history.    Examination-Activity Limitations Caring for Others;Carry;Locomotion Level;Stand;Stairs;Lift;Squat;Sleep;Transfers    Examination-Participation Restrictions Laundry;Cleaning;Community Activity;Yard Work;Interpersonal Relationship   bending, gardening, picking strawberries, walking, standing, cooking, walking up hill, etc   Stability/Clinical Decision Making Stable/Uncomplicated    Rehab Potential Good    PT Frequency 2x / week    PT  Duration 12 weeks    PT Treatment/Interventions ADLs/Self Care Home Management;Cryotherapy;Moist Heat;Electrical Stimulation;Therapeutic activities;Therapeutic exercise;Balance training;Neuromuscular re-education;Patient/family education;Manual techniques;Dry needling;Passive range of motion;Joint Manipulations;Spinal Manipulations    PT Next Visit Plan strengthening and muscular endurance exercise, neurodynamics, functional strengthening, manual as needed    PT Home Exercise Plan Medbridge Access Code: [email or web address]    Consulted and Agree with Plan of Care Patient           Patient will benefit from skilled therapeutic intervention in order to improve the following deficits and impairments:  Improper body mechanics,Pain,Postural dysfunction,Increased muscle spasms,Decreased coordination,Decreased mobility,Decreased activity tolerance,Decreased endurance,Decreased range of motion,Decreased strength,Hypomobility,Impaired perceived functional ability,Difficulty walking,Impaired flexibility  Visit Diagnosis: Bilateral low back pain with left-sided sciatica, unspecified chronicity  Difficulty in walking, not elsewhere classified     Problem List Patient Active Problem List   Diagnosis Date Noted  . Has immunity to COVID-19 virus 09/18/2020  . Headache 08/22/2020  . Dizziness 08/22/2020  . Stress 04/20/2020  . Decreased GFR 03/11/2020   . Leg pain 11/04/2019  . Osteopenia 11/04/2018  . Rectal bleeding 06/27/2018  . Joint pain 06/08/2016  . Sleeping difficulty 04/06/2015  . SOB (shortness of breath) 02/02/2015  . Health care maintenance 02/02/2015  . Paraesophageal hiatal hernia 01/21/2015  . Esophageal reflux 01/21/2015  . Hernia of abdominal cavity 10/22/2014  . Back pain 10/13/2014  . Hyperglycemia 10/08/2014  . Abdominal hernia 10/08/2014  . Breast cancer ([place]) 07/09/2014  . Knee pain, right 07/09/2014  . Obstructive sleep apnea 12/23/2013  . Chest pain 10/01/2013  . Hypertension 10/25/2012  . GERD (gastroesophageal reflux disease) 10/25/2012  . Hypercholesterolemia 10/25/2012  . Allergic rhinitis 10/25/2012    Everlean Alstrom. Graylon Good, PT, DPT 01/29/21, 10:33 AM  [facility] [address], [place], [address] Phone: [phone]   Fax:  [phone]  Name: LYNNSIE LINDERS MRN: [id number] Date of Birth: [birth year]

## 2021-02-03 ENCOUNTER — Ambulatory Visit: Payer: Medicare HMO | Admitting: Physical Therapy

## 2021-02-03 ENCOUNTER — Encounter: Payer: Self-pay | Admitting: Physical Therapy

## 2021-02-03 ENCOUNTER — Other Ambulatory Visit: Payer: Self-pay

## 2021-02-03 DIAGNOSIS — M4802 Spinal stenosis, cervical region: Secondary | ICD-10-CM | POA: Diagnosis not present

## 2021-02-03 DIAGNOSIS — M5442 Lumbago with sciatica, left side: Secondary | ICD-10-CM | POA: Diagnosis not present

## 2021-02-03 DIAGNOSIS — R262 Difficulty in walking, not elsewhere classified: Secondary | ICD-10-CM | POA: Diagnosis not present

## 2021-02-03 DIAGNOSIS — M4722 Other spondylosis with radiculopathy, cervical region: Secondary | ICD-10-CM | POA: Diagnosis not present

## 2021-02-03 DIAGNOSIS — M5412 Radiculopathy, cervical region: Secondary | ICD-10-CM | POA: Diagnosis not present

## 2021-02-03 NOTE — Therapy (Signed)
[facility] [address], [place], [address] Phone: [phone]   Fax:  [phone]  Physical Therapy Treatment  Patient Details  Name: Carla Ryan MRN: [id number] Date of Birth: 09/11/1946 Referring Provider (PT): Einar Pheasant, MD   Encounter Date: 02/03/2021   PT End of Session - 02/03/21 1011    Visit Number 5    Number of Visits 24    Date for PT Re-Evaluation 03/25/21    Authorization Type [employer] reporting period from 12/31/2020    Authorization Time Period [employer] #[id number] 1/25-3/4 12 PT visits    Authorization - Visit Number 4    Authorization - Number of Visits 12    Progress Note Due on Visit 10    PT Start Time 0946    PT Stop Time 1025    PT Time Calculation (min) 39 min    Activity Tolerance Patient tolerated treatment well    Behavior During Therapy [facility] for tasks assessed/performed           Past Medical History:  Diagnosis Date  . Abdominal hernia   . Anxiety    situational stress and anxiety  . Cancer ([place]) 2015   breast   . Cataract cortical, senile, unspecified laterality   . Chest pain   . Exertional dyspnea   . GERD (gastroesophageal reflux disease)   . Hiatal hernia   . Hiatal hernia   . Hypercholesterolemia   . Hyperlipidemia   . Hypertension   . Osteoarthritis    scoliosis, degenerative disc dz, knee, carpal tunnel  . Sleep apnea     Past Surgical History:  Procedure Laterality Date  . ABDOMINAL HYSTERECTOMY  07/2000  . APPENDECTOMY  2000  . APPENDECTOMY    . BREAST BIOPSY Bilateral 2014  . BREAST SURGERY Right 2015   lumpectomy  . CARPAL TUNNEL RELEASE    . COLONOSCOPY  2011   [name], [name]: Normal exam  . COLONOSCOPY WITH PROPOFOL N/A 04/03/2020   Procedure: COLONOSCOPY WITH PROPOFOL;  Surgeon: Toledo, Benay Pike, MD;  Location: [facility] ENDOSCOPY;  Service: Gastroenterology;  Laterality: N/A;  . ESOPHAGOGASTRODUODENOSCOPY (EGD) WITH  PROPOFOL N/A 04/03/2020   Procedure: ESOPHAGOGASTRODUODENOSCOPY (EGD) WITH PROPOFOL;  Surgeon: Toledo, Benay Pike, MD;  Location: [facility] ENDOSCOPY;  Service: Gastroenterology;  Laterality: N/A;  . HERNIA REPAIR  02/10/15  . Laparoscopic Repair of Lateral Abdominal Wall Hernia  02/10/15  . MASTECTOMY    . UPPER GI ENDOSCOPY  05/10/2014   [name], [name] Bile gastritis, medium hiatal hernia. Incomplete visualization of the cardia and fundus.    There were no vitals filed for this visit.   Subjective Assessment - 02/03/21 1002    Subjective Patient report she is feeling well this morning with no pain upon arrival. States she continues to get pain in the legs in the morning and late in the day. She has been doing her HEP with good tolerance.    Pertinent History Patient is a 75 y.o. female who presents to outpatient physical therapy with a referral for medical diagnosis low back pain. This patient's chief complaints consist of episodic low back pain with most recent episode starting Nov/Dec 2021, radiation down left leg above knee, leading to the following functional deficits: difficulty with walking, bending, gardening, picking strawberries, walking, standing, cooking, walking up hill, etc.  Relevant past medical history and comorbidities include scoliosis,right upper back/shoulder region (referred to Dr. Sharlet Salina), breast cancer in  2015 (takes a  hormome therapy, followed by cancer center), fatigue, osteopenia, OSA, GED, abdominal hernia (repair 2016), shortness of breath after eating, hiatial hernia (supposed to see surgeon), dizziness (episodes in Aug 2021), HTN, see chart for additional history.  Patient denies hx of stroke, seizures, lung problem, major cardiac events, diabetes, unexplained weight loss, changes in bowel or bladder problems, new onset stumbling or dropping things.    Limitations Standing;Walking;Lifting;House hold activities    How long can you sit comfortably? doesn't bother her much     How long can you stand comfortably? 30-60 min in the kitchen but it hurts    How long can you walk comfortably? 10-15 min    Diagnostic tests no recent imaging  Lumbar/thoracic MRI report from 09/09/2014: "Impression:   1. Severe levoscoliosis of the thoracolumbar spine, centered at L1-L2. No   canal or foraminal stenosis in the thoracic spine. Multilevel degenerative   disc disease in the lumbar spine without advanced canal or foraminal   stenosis.   2. No pathologic marrow signal noted. "    Currently in Pain? No/denies           TREATMENT: Therapeutic exercise:to centralize symptoms and improve ROM, strength, muscular endurance, and activity tolerance required for successfulcompletion of functional activities. -NuStep level2using bilateral upper and lower extremities. Seat/handle setting 5/7. For improved extremity mobility, muscular endurance, and activity tolerance; and to induce the analgesic effect of aerobic exercise, stimulate improved joint nutrition, and prepare body structures and systems for following interventions. x21mnutes. Average SPM = 66 (mild burming in knees she attributes to resistance).  - hooklying lower trunk rotation  x20 with green theraball under legs. (mild pull on the right). Cuing for abdominal control.  - hooklying double knees to chest with green theraball under feet x 20. Cuing for abdominal control.  - hooklying abdominal brace with LE bicycle, 3x10 each side - "Pilates 100" in hooklying (sustained cruch with arm beats while breathing), 5x20 hand beats.(last set added breathing in on first two arm  beats, out 2nd two beats).   - hooklying abdominal brace with sequential hip flexion to table top and back down, 3x10 each set leading with a different LE, then alternating last set. . - prone abdominal brace with multifidus kick with pillow under hips, 2x10 each side. Tactile cuing at abdominals.  - standing hip diagonal lateral/back  2x10 each side. Focus  on standing tall and abdominals engaged.  - Education on HEP including handout   HOME EXERCISE PROGRAM Access Code: [email or web address]: [email or web address] Date: 02/03/2021 Prepared by: SRosita Kea Exercises Supine Lower Trunk Rotation - 1 x daily - 1 sets - 20 reps Beginner Flat Bicycle - 1 x daily - 3 sets - 10 reps Prone Hip Extension with Pillow Under Abdomen - 1 x daily - 3 sets - 10 reps    PT Education - 02/03/21 1028    Education Details Exercise purpose/form. Self management techniques    Person(s) Educated Patient    Methods Explanation;Demonstration;Tactile cues;Verbal cues;Handout    Comprehension Verbalized understanding;Returned demonstration;Verbal cues required;Tactile cues required;Need further instruction            PT Short Term Goals - 01/13/21 1046      PT SHORT TERM GOAL #1   Title Be independent with initial home exercise program for self-management of symptoms.    Baseline initial HEP provided at IE (12/31/2020);    Time 2    Period Weeks    Status Achieved    Target Date 01/15/21  PT Long Term Goals - 01/13/21 1046      PT LONG TERM GOAL #1   Title Be independent with a long-term home exercise program for self-management of symptoms.    Baseline initial HEP provided at IE (12/31/2020);    Time 12    Period Weeks    Status Partially Met   TARGET DATE FOR ALL LONG TERM GOALS: 03/25/2021     PT LONG TERM GOAL #2   Title Demonstrate improved FOTO score to equal or greater than 66 by visit #11 demonstrate improvement in overall condition and self-reported functional ability.    Baseline 56 (12/31/2020);    Time 12    Period Weeks    Status On-going      PT LONG TERM GOAL #3   Title Patient will complete 1000 feet during 6 Minute Walk Test to demonstrate improved community mobility.    Baseline to be tested on visit 2 (01/02/2020); 1350 with pain (01/13/2021);    Time 12    Period Weeks    Status Achieved      PT LONG  TERM GOAL #4   Title Reduce pain with functional activities to equal or less than 1/10 to allow patient to complete usual activities including ADLs, IADLs, and social engagement with less difficulty.    Baseline 8-9/10 (12/31/2020);    Time 12    Period Weeks    Status On-going      PT LONG TERM GOAL #5   Title Complete community, work and/or recreational activities without limitation due to current condition.    Baseline Functional Limitations: bending, gardening, picking strawberries, walking, standing, cooking, walking up hill, etc (12/31/2020);    Time 12    Period Weeks    Status On-going                 Plan - 02/03/21 1024    Clinical Impression Statement Patient tolerated treatment well with occasional feeling of fatigue in LEs but no exacerbation of pain. Progressed difficulty of core exercises and HEP due to improved ability. Also started standing exercise to improve translation of improved core control  to functional positions. Patient would benefit from continued management of limiting condition by skilled physical therapist to address remaining impairments and functional limitations to work towards stated goals and return to PLOF or maximal functional independence.    Personal Factors and Comorbidities Age;Comorbidity 3+;Past/Current Experience;Fitness;Time since onset of injury/illness/exacerbation    Comorbidities Relevant past medical history and comorbidities include scoliosis,right upper back/shoulder region (referred to Dr. Sharlet Salina), breast cancer in  2015 (takes a hormome therapy, followed by cancer center), fatigue, osteopenia, OSA, GED, abdominal hernia (repair 2016), shortness of breath after eating, hiatial hernia (supposed to see surgeon), dizziness (episodes in Aug 2021), HTN, see chart for additional history.    Examination-Activity Limitations Caring for Others;Carry;Locomotion Level;Stand;Stairs;Lift;Squat;Sleep;Transfers    Examination-Participation  Restrictions Laundry;Cleaning;Community Activity;Yard Work;Interpersonal Relationship   bending, gardening, picking strawberries, walking, standing, cooking, walking up hill, etc   Stability/Clinical Decision Making Stable/Uncomplicated    Rehab Potential Good    PT Frequency 2x / week    PT Duration 12 weeks    PT Treatment/Interventions ADLs/Self Care Home Management;Cryotherapy;Moist Heat;Electrical Stimulation;Therapeutic activities;Therapeutic exercise;Balance training;Neuromuscular re-education;Patient/family education;Manual techniques;Dry needling;Passive range of motion;Joint Manipulations;Spinal Manipulations    PT Next Visit Plan strengthening and muscular endurance exercise, neurodynamics, functional strengthening, manual as needed    PT Home Exercise Plan Medbridge Access Code: [email or web address]    Consulted and Agree with Plan of Care Patient  Patient will benefit from skilled therapeutic intervention in order to improve the following deficits and impairments:  Improper body mechanics,Pain,Postural dysfunction,Increased muscle spasms,Decreased coordination,Decreased mobility,Decreased activity tolerance,Decreased endurance,Decreased range of motion,Decreased strength,Hypomobility,Impaired perceived functional ability,Difficulty walking,Impaired flexibility  Visit Diagnosis: Bilateral low back pain with left-sided sciatica, unspecified chronicity  Difficulty in walking, not elsewhere classified     Problem List Patient Active Problem List   Diagnosis Date Noted  . Has immunity to COVID-19 virus 09/18/2020  . Headache 08/22/2020  . Dizziness 08/22/2020  . Stress 04/20/2020  . Decreased GFR 03/11/2020  . Leg pain 11/04/2019  . Osteopenia 11/04/2018  . Rectal bleeding 06/27/2018  . Joint pain 06/08/2016  . Sleeping difficulty 04/06/2015  . SOB (shortness of breath) 02/02/2015  . Health care maintenance 02/02/2015  . Paraesophageal hiatal hernia 01/21/2015  .  Esophageal reflux 01/21/2015  . Hernia of abdominal cavity 10/22/2014  . Back pain 10/13/2014  . Hyperglycemia 10/08/2014  . Abdominal hernia 10/08/2014  . Breast cancer ([place]) 07/09/2014  . Knee pain, right 07/09/2014  . Obstructive sleep apnea 12/23/2013  . Chest pain 10/01/2013  . Hypertension 10/25/2012  . GERD (gastroesophageal reflux disease) 10/25/2012  . Hypercholesterolemia 10/25/2012  . Allergic rhinitis 10/25/2012   Everlean Alstrom. Graylon Good, PT, DPT 02/03/21, 10:29 AM  [facility] [address], [place], [address] Phone: [phone]   Fax:  [phone]  Name: SHAM ALVIAR MRN: [id number] Date of Birth: [birth year]

## 2021-02-05 ENCOUNTER — Ambulatory Visit: Payer: Medicare HMO

## 2021-02-05 ENCOUNTER — Other Ambulatory Visit: Payer: Self-pay

## 2021-02-05 DIAGNOSIS — R262 Difficulty in walking, not elsewhere classified: Secondary | ICD-10-CM

## 2021-02-05 DIAGNOSIS — M5442 Lumbago with sciatica, left side: Secondary | ICD-10-CM

## 2021-02-05 NOTE — Therapy (Signed)
[facility] [address] [place], [address] Phone: [phone]   Fax:  [phone]  Physical Therapy Treatment  Patient Details  Name: Carla Ryan MRN: [id number] Date of Birth: 09/03/1946 Referring Provider (PT): Einar Pheasant, MD   Encounter Date: 02/05/2021   PT End of Session - 02/05/21 0951    Visit Number 6    Number of Visits 24    Date for PT Re-Evaluation 03/25/21    Authorization Type [employer] reporting period from 12/31/2020    Authorization Time Period [employer] #[id number] 1/25-3/4 12 PT visits    Authorization - Visit Number 5    Authorization - Number of Visits 12    Progress Note Due on Visit 10    PT Start Time 0946    PT Stop Time 1028    PT Time Calculation (min) 42 min    Activity Tolerance Patient tolerated treatment well           Past Medical History:  Diagnosis Date  . Abdominal hernia   . Anxiety    situational stress and anxiety  . Cancer ([place]) 2015   breast   . Cataract cortical, senile, unspecified laterality   . Chest pain   . Exertional dyspnea   . GERD (gastroesophageal reflux disease)   . Hiatal hernia   . Hiatal hernia   . Hypercholesterolemia   . Hyperlipidemia   . Hypertension   . Osteoarthritis    scoliosis, degenerative disc dz, knee, carpal tunnel  . Sleep apnea     Past Surgical History:  Procedure Laterality Date  . ABDOMINAL HYSTERECTOMY  07/2000  . APPENDECTOMY  2000  . APPENDECTOMY    . BREAST BIOPSY Bilateral 2014  . BREAST SURGERY Right 2015   lumpectomy  . CARPAL TUNNEL RELEASE    . COLONOSCOPY  2011   [name], [name]: Normal exam  . COLONOSCOPY WITH PROPOFOL N/A 04/03/2020   Procedure: COLONOSCOPY WITH PROPOFOL;  Surgeon: Toledo, Benay Pike, MD;  Location: [facility];  Service: Gastroenterology;  Laterality: N/A;  . ESOPHAGOGASTRODUODENOSCOPY (EGD) WITH PROPOFOL N/A 04/03/2020   Procedure:  ESOPHAGOGASTRODUODENOSCOPY (EGD) WITH PROPOFOL;  Surgeon: Toledo, Benay Pike, MD;  Location: [facility];  Service: Gastroenterology;  Laterality: N/A;  . HERNIA REPAIR  02/10/15  . Laparoscopic Repair of Lateral Abdominal Wall Hernia  02/10/15  . MASTECTOMY    . UPPER GI ENDOSCOPY  05/10/2014   [name], [name] Bile gastritis, medium hiatal hernia. Incomplete visualization of the cardia and fundus.    There were no vitals filed for this visit.   Subjective Assessment - 02/05/21 0948    Subjective Patient reported that she was a little bit after therapy earlier this week, but feels like it was appropriate soreness. Did report pain with making breakfast, but sitting or moving relieves it.    Pertinent History Patient is a 75 y.o. female who presents to outpatient physical therapy with a referral for medical diagnosis low back pain. This patient's chief complaints consist of episodic low back pain with most recent episode starting Nov/Dec 2021, radiation down left leg above knee, leading to the following functional deficits: difficulty with walking, bending, gardening, picking strawberries, walking, standing, cooking, walking up hill, etc.  Relevant past medical history and comorbidities include scoliosis,right upper back/shoulder region (referred to Dr. Sharlet Salina), breast cancer in  2015 (takes a hormome therapy, followed by cancer center), fatigue, osteopenia, OSA, GED, abdominal hernia (repair 2016), shortness of breath  after eating, hiatial hernia (supposed to see surgeon), dizziness (episodes in Aug 2021), HTN, see chart for additional history.  Patient denies hx of stroke, seizures, lung problem, major cardiac events, diabetes, unexplained weight loss, changes in bowel or bladder problems, new onset stumbling or dropping things.    Limitations Standing;Walking;Lifting;House hold activities    How long can you sit comfortably? doesn't bother her much    How long can you stand comfortably? 30-60  min in the kitchen but it hurts    How long can you walk comfortably? 10-15 min    Diagnostic tests no recent imaging  Lumbar/thoracic MRI report from 09/09/2014: "Impression:   1. Severe levoscoliosis of the thoracolumbar spine, centered at L1-L2. No   canal or foraminal stenosis in the thoracic spine. Multilevel degenerative   disc disease in the lumbar spine without advanced canal or foraminal   stenosis.   2. No pathologic marrow signal noted. "    Currently in Pain? No/denies           TREATMENT:    Therapeutic exercise: to centralize symptoms and improve ROM, strength, muscular endurance, and activity tolerance required for successful completion of functional activities.    - NuStep level 2 using bilateral upper and lower extremities. Seat/handle setting 5/7. For improved extremity mobility, muscular endurance, and activity tolerance; and to induce the analgesic effect of aerobic exercise, stimulate improved joint nutrition, and prepare body structures and systems for following interventions. x 5  minutes. Average SPM = 66 (mild burming in knees she attributes to resistance).    - hooklying lower trunk rotation  x20 with green theraball under legs. (mild pull on the right). Cuing for abdominal control.    - hooklying double knees to chest with green theraball under feet x 20. Cuing for abdominal control.    - straight leg bridge from ball x10 (new ex)  -quadruped arm flexion alternating  x10 (new ex)  -quadruped leg extension alternating x10 (new ex)  -dead bugs not alternating movements 2x10,  (new ex)  - hooklying abdominal brace with LE bicycle, 1x10, 2x5 cueing for more TKE at end of motion, pt fatigued quickly   - "Pilates 100" in hooklying (sustained cruch with arm beats while breathing), 5x20 hand beats.(last set added breathing in on first two arm  beats, out 2nd two beats).     - hooklying abdominal brace with sequential hip flexion to table top and back down, 3x10 each    - prone abdominal brace with multifidus kick with pillow under hips, 2x10 each side. Tactile cuing at abdominals.    - standing hip diagonal lateral/back  2x10 each side. Focus on standing tall and abdominals engaged.    - Education on HEP including handout    The patient did well with session today had complaints of fatigue, but no exacerbation of pain. Did report some L shoulder pain with pilates 100 but resolved with adjustment of form. Intermittent cueing for abdominal activation, and multimodal cueing necessary for new exercises today. The patient would benefit from further skilled PT to continue to progress towards goals and improve QOL.  HOME EXERCISE PROGRAM   Access Code: [email or web address]   URL: [email or web address]   Date: 02/03/2021   Prepared by: Rosita Kea       Exercises   Supine Lower Trunk Rotation - 1 x daily - 1 sets - 20 reps   Beginner Flat Bicycle - 1 x daily - 3 sets - 10 reps   Prone Hip Extension  with Pillow Under Abdomen - 1 x daily - 3 sets - 10 reps          PT Education - 02/05/21 0949    Education Details exercise form/technique    Person(s) Educated Patient    Methods Explanation    Comprehension Verbalized understanding;Returned demonstration;Verbal cues required;Tactile cues required            PT Short Term Goals - 01/13/21 1046      PT SHORT TERM GOAL #1   Title Be independent with initial home exercise program for self-management of symptoms.    Baseline initial HEP provided at IE (12/31/2020);    Time 2    Period Weeks    Status Achieved    Target Date 01/15/21             PT Long Term Goals - 01/13/21 1046      PT LONG TERM GOAL #1   Title Be independent with a long-term home exercise program for self-management of symptoms.    Baseline initial HEP provided at IE (12/31/2020);    Time 12    Period Weeks    Status Partially Met   TARGET DATE FOR ALL LONG TERM GOALS: 03/25/2021     PT LONG TERM GOAL #2    Title Demonstrate improved FOTO score to equal or greater than 66 by visit #11 demonstrate improvement in overall condition and self-reported functional ability.    Baseline 56 (12/31/2020);    Time 12    Period Weeks    Status On-going      PT LONG TERM GOAL #3   Title Patient will complete 1000 feet during 6 Minute Walk Test to demonstrate improved community mobility.    Baseline to be tested on visit 2 (01/02/2020); 1350 with pain (01/13/2021);    Time 12    Period Weeks    Status Achieved      PT LONG TERM GOAL #4   Title Reduce pain with functional activities to equal or less than 1/10 to allow patient to complete usual activities including ADLs, IADLs, and social engagement with less difficulty.    Baseline 8-9/10 (12/31/2020);    Time 12    Period Weeks    Status On-going      PT LONG TERM GOAL #5   Title Complete community, work and/or recreational activities without limitation due to current condition.    Baseline Functional Limitations: bending, gardening, picking strawberries, walking, standing, cooking, walking up hill, etc (12/31/2020);    Time 12    Period Weeks    Status On-going                 Plan - 02/05/21 0865    Clinical Impression Statement The patient did well with session today had complaints of fatigue, but no exacerbation of pain. Did report some L shoulder pain with pilates 100 but resolved with adjustment of form. Intermittent cueing for abdominal activation, and multimodal cueing necessary for new exercises today. The patient would benefit from further skilled PT to continue to progress towards goals and improve QOL.    Personal Factors and Comorbidities Age;Comorbidity 3+;Past/Current Experience;Fitness;Time since onset of injury/illness/exacerbation    Comorbidities Relevant past medical history and comorbidities include scoliosis,right upper back/shoulder region (referred to Dr. Sharlet Salina), breast cancer in  2015 (takes a hormome therapy, followed by  cancer center), fatigue, osteopenia, OSA, GED, abdominal hernia (repair 2016), shortness of breath after eating, hiatial hernia (supposed to see surgeon), dizziness (episodes in Aug  2021), HTN, see chart for additional history.    Examination-Activity Limitations Caring for Others;Carry;Locomotion Level;Stand;Stairs;Lift;Squat;Sleep;Transfers    Examination-Participation Restrictions Laundry;Cleaning;Community Activity;Yard Work;Interpersonal Relationship   bending, gardening, picking strawberries, walking, standing, cooking, walking up hill, etc   Stability/Clinical Decision Making Stable/Uncomplicated    Rehab Potential Good    PT Frequency 2x / week    PT Duration 12 weeks    PT Treatment/Interventions ADLs/Self Care Home Management;Cryotherapy;Moist Heat;Electrical Stimulation;Therapeutic activities;Therapeutic exercise;Balance training;Neuromuscular re-education;Patient/family education;Manual techniques;Dry needling;Passive range of motion;Joint Manipulations;Spinal Manipulations    PT Next Visit Plan strengthening and muscular endurance exercise, neurodynamics, functional strengthening, manual as needed    PT Home Exercise Plan Medbridge Access Code: [email or web address]    Consulted and Agree with Plan of Care Patient           Patient will benefit from skilled therapeutic intervention in order to improve the following deficits and impairments:  Improper body mechanics,Pain,Postural dysfunction,Increased muscle spasms,Decreased coordination,Decreased mobility,Decreased activity tolerance,Decreased endurance,Decreased range of motion,Decreased strength,Hypomobility,Impaired perceived functional ability,Difficulty walking,Impaired flexibility  Visit Diagnosis: Bilateral low back pain with left-sided sciatica, unspecified chronicity  Difficulty in walking, not elsewhere classified     Problem List Patient Active Problem List   Diagnosis Date Noted  . Has immunity to COVID-19 virus 09/18/2020   . Headache 08/22/2020  . Dizziness 08/22/2020  . Stress 04/20/2020  . Decreased GFR 03/11/2020  . Leg pain 11/04/2019  . Osteopenia 11/04/2018  . Rectal bleeding 06/27/2018  . Joint pain 06/08/2016  . Sleeping difficulty 04/06/2015  . SOB (shortness of breath) 02/02/2015  . Health care maintenance 02/02/2015  . Paraesophageal hiatal hernia 01/21/2015  . Esophageal reflux 01/21/2015  . Hernia of abdominal cavity 10/22/2014  . Back pain 10/13/2014  . Hyperglycemia 10/08/2014  . Abdominal hernia 10/08/2014  . Breast cancer ([place]) 07/09/2014  . Knee pain, right 07/09/2014  . Obstructive sleep apnea 12/23/2013  . Chest pain 10/01/2013  . Hypertension 10/25/2012  . GERD (gastroesophageal reflux disease) 10/25/2012  . Hypercholesterolemia 10/25/2012  . Allergic rhinitis 10/25/2012    Lieutenant Diego PT, DPT 10:31 AM,02/05/21   [facility] [address], [place], [address] Phone: [phone]   Fax:  [phone]  Name: Carla Ryan MRN: [id number] Date of Birth: 07/08/1946

## 2021-02-07 ENCOUNTER — Other Ambulatory Visit: Payer: Self-pay

## 2021-02-07 ENCOUNTER — Ambulatory Visit
Admission: RE | Admit: 2021-02-07 | Discharge: 2021-02-07 | Disposition: A | Discharge status: Home / Self Care | Payer: Medicare HMO | Source: Ambulatory Visit | Attending: Physical Medicine and Rehabilitation | Admitting: Physical Medicine and Rehabilitation

## 2021-02-07 DIAGNOSIS — M542 Cervicalgia: Secondary | ICD-10-CM | POA: Diagnosis not present

## 2021-02-07 DIAGNOSIS — M5412 Radiculopathy, cervical region: Secondary | ICD-10-CM

## 2021-02-10 ENCOUNTER — Other Ambulatory Visit: Payer: Self-pay

## 2021-02-10 ENCOUNTER — Ambulatory Visit: Payer: Medicare HMO

## 2021-02-10 DIAGNOSIS — M5442 Lumbago with sciatica, left side: Secondary | ICD-10-CM | POA: Diagnosis not present

## 2021-02-10 DIAGNOSIS — R262 Difficulty in walking, not elsewhere classified: Secondary | ICD-10-CM | POA: Diagnosis not present

## 2021-02-10 NOTE — Therapy (Signed)
[facility] [address] [place], [address] Phone: [phone]   Fax:  [phone]  Physical Therapy Treatment  Patient Details  Name: Carla Ryan MRN: [id number] Date of Birth: [birth year] Referring Provider (PT): Einar Pheasant, MD   Encounter Date: 02/10/2021   PT End of Session - 02/10/21 1036    Visit Number 7    Number of Visits 24    Date for PT Re-Evaluation 03/25/21    Authorization Type [employer] reporting period from 12/31/2020    Authorization Time Period [employer] #[id number] 1/25-3/4 12 PT visits    Authorization - Visit Number 6    Authorization - Number of Visits 12    Progress Note Due on Visit 10    PT Start Time 0945    PT Stop Time 1030    PT Time Calculation (min) 45 min    Activity Tolerance Patient tolerated treatment well           Past Medical History:  Diagnosis Date  . Abdominal hernia   . Anxiety    situational stress and anxiety  . Cancer ([place]) 2015   breast   . Cataract cortical, senile, unspecified laterality   . Chest pain   . Exertional dyspnea   . GERD (gastroesophageal reflux disease)   . Hiatal hernia   . Hiatal hernia   . Hypercholesterolemia   . Hyperlipidemia   . Hypertension   . Osteoarthritis    scoliosis, degenerative disc dz, knee, carpal tunnel  . Sleep apnea     Past Surgical History:  Procedure Laterality Date  . ABDOMINAL HYSTERECTOMY  07/2000  . APPENDECTOMY  2000  . APPENDECTOMY    . BREAST BIOPSY Bilateral 2014  . BREAST SURGERY Right 2015   lumpectomy  . CARPAL TUNNEL RELEASE    . COLONOSCOPY  2011   [name], [name]: Normal exam  . COLONOSCOPY WITH PROPOFOL N/A 04/03/2020   Procedure: COLONOSCOPY WITH PROPOFOL;  Surgeon: Toledo, Benay Pike, MD;  Location: [facility];  Service: Gastroenterology;  Laterality: N/A;  . ESOPHAGOGASTRODUODENOSCOPY (EGD) WITH PROPOFOL N/A 04/03/2020   Procedure:  ESOPHAGOGASTRODUODENOSCOPY (EGD) WITH PROPOFOL;  Surgeon: Toledo, Benay Pike, MD;  Location: [facility];  Service: Gastroenterology;  Laterality: N/A;  . HERNIA REPAIR  02/10/15  . Laparoscopic Repair of Lateral Abdominal Wall Hernia  02/10/15  . MASTECTOMY    . UPPER GI ENDOSCOPY  05/10/2014   [name], [name] Bile gastritis, medium hiatal hernia. Incomplete visualization of the cardia and fundus.    There were no vitals filed for this visit.   Subjective Assessment - 02/10/21 1035    Subjective Pt states overall her lower back is feeling better.  She reports that standing stationary, like when she dries her hair in the bathroom, is the most difficulty activity still.  She reports she has been working on her HEP.    Pertinent History Patient is a 75 y.o. female who presents to outpatient physical therapy with a referral for medical diagnosis low back pain. This patient's chief complaints consist of episodic low back pain with most recent episode starting Nov/Dec 2021, radiation down left leg above knee, leading to the following functional deficits: difficulty with walking, bending, gardening, picking strawberries, walking, standing, cooking, walking up hill, etc.  Relevant past medical history and comorbidities include scoliosis,right upper back/shoulder region (referred to Dr. Sharlet Salina), breast cancer in  2015 (takes a hormome therapy, followed by cancer center), fatigue, osteopenia, OSA,  GED, abdominal hernia (repair 2016), shortness of breath after eating, hiatial hernia (supposed to see surgeon), dizziness (episodes in Aug 2021), HTN, see chart for additional history.  Patient denies hx of stroke, seizures, lung problem, major cardiac events, diabetes, unexplained weight loss, changes in bowel or bladder problems, new onset stumbling or dropping things.    Limitations Standing;Walking;Lifting;House hold activities    How long can you sit comfortably? doesn't bother her much    How long can  you stand comfortably? 30-60 min in the kitchen but it hurts    How long can you walk comfortably? 10-15 min    Diagnostic tests no recent imaging  Lumbar/thoracic MRI report from 09/09/2014: "Impression:   1. Severe levoscoliosis of the thoracolumbar spine, centered at L1-L2. No   canal or foraminal stenosis in the thoracic spine. Multilevel degenerative   disc disease in the lumbar spine without advanced canal or foraminal   stenosis.   2. No pathologic marrow signal noted. "           Treatment Today: Therapeutic exercise: to centralize symptoms and improve ROM, strength, muscular endurance, and activity tolerance required for successful completion of functional activities.    - NuStep level 2 using bilateral upper and lower extremities. Seat/handle setting 5/7. For improved extremity mobility, muscular endurance, and activity tolerance; and to induce the analgesic effect of aerobic exercise, stimulate improved joint nutrition, and prepare body structures and systems for following interventions. x 5  minutes. Average SPM = 66 (mild burming in knees she attributes to resistance).     - hooklying lower trunk rotation  x20 with green theraball under legs. (mild pull on the right). Cuing for abdominal control.     - hooklying double knees to chest with green theraball under feet x 20. Cuing for abdominal control.     - straight leg bridge from ball x10   -quadruped arm flexion alternating  x10 cues for long spine   -quadruped leg extension alternating x10 cues for long spine   -dead bugs alternating movements 2x10, pt able to alternate with fair control today   - hooklying abdominal brace with LE bicycle, 1x10, 2 sets   - "Pilates 100" in hooklying (sustained cruch with arm beats while breathing), 5x20 hand beats.(last set added breathing in on first two arm  beats, out 2nd two beats).      - hooklying abdominal brace with sequential hip flexion to table top and back down, 3x10 each       - standing hip diagonal lateral/back  2x10 each side. Focus on standing tall and abdominals engaged.           PT Education - 02/10/21 1036    Education Details exercise form throughout session    Person(s) Educated Patient    Methods Explanation;Tactile cues;Verbal cues    Comprehension Verbalized understanding;Returned demonstration            PT Short Term Goals - 01/13/21 1046      PT SHORT TERM GOAL #1   Title Be independent with initial home exercise program for self-management of symptoms.    Baseline initial HEP provided at IE (12/31/2020);    Time 2    Period Weeks    Status Achieved    Target Date 01/15/21             PT Long Term Goals - 01/13/21 1046      PT LONG TERM GOAL #1   Title Be independent with a long-term  home exercise program for self-management of symptoms.    Baseline initial HEP provided at IE (12/31/2020);    Time 12    Period Weeks    Status Partially Met   TARGET DATE FOR ALL LONG TERM GOALS: 03/25/2021     PT LONG TERM GOAL #2   Title Demonstrate improved FOTO score to equal or greater than 66 by visit #11 demonstrate improvement in overall condition and self-reported functional ability.    Baseline 56 (12/31/2020);    Time 12    Period Weeks    Status On-going      PT LONG TERM GOAL #3   Title Patient will complete 1000 feet during 6 Minute Walk Test to demonstrate improved community mobility.    Baseline to be tested on visit 2 (01/02/2020); 1350 with pain (01/13/2021);    Time 12    Period Weeks    Status Achieved      PT LONG TERM GOAL #4   Title Reduce pain with functional activities to equal or less than 1/10 to allow patient to complete usual activities including ADLs, IADLs, and social engagement with less difficulty.    Baseline 8-9/10 (12/31/2020);    Time 12    Period Weeks    Status On-going      PT LONG TERM GOAL #5   Title Complete community, work and/or recreational activities without limitation due to current  condition.    Baseline Functional Limitations: bending, gardening, picking strawberries, walking, standing, cooking, walking up hill, etc (12/31/2020);    Time 12    Period Weeks    Status On-going                 Plan - 02/10/21 1037    Clinical Impression Statement Pt was able to complete therapeutic exercises for lumbar spine strengthening without c/o increased LBP at end of session.  She does benefit from verbal and tactile cues for abdominal muscle activation and breath patterns during exercises instead of utilizing breath holding patterns.  She should continue to benefit from skilled PT to progress towards her goals and improve QOL.    Personal Factors and Comorbidities Age;Comorbidity 3+;Past/Current Experience;Fitness;Time since onset of injury/illness/exacerbation    Comorbidities Relevant past medical history and comorbidities include scoliosis,right upper back/shoulder region (referred to Dr. Sharlet Salina), breast cancer in  2015 (takes a hormome therapy, followed by cancer center), fatigue, osteopenia, OSA, GED, abdominal hernia (repair 2016), shortness of breath after eating, hiatial hernia (supposed to see surgeon), dizziness (episodes in Aug 2021), HTN, see chart for additional history.    Examination-Activity Limitations Caring for Others;Carry;Locomotion Level;Stand;Stairs;Lift;Squat;Sleep;Transfers    Examination-Participation Restrictions Laundry;Cleaning;Community Activity;Yard Work;Interpersonal Relationship   bending, gardening, picking strawberries, walking, standing, cooking, walking up hill, etc   Stability/Clinical Decision Making Stable/Uncomplicated    Rehab Potential Good    PT Frequency 2x / week    PT Duration 12 weeks    PT Treatment/Interventions ADLs/Self Care Home Management;Cryotherapy;Moist Heat;Electrical Stimulation;Therapeutic activities;Therapeutic exercise;Balance training;Neuromuscular re-education;Patient/family education;Manual techniques;Dry  needling;Passive range of motion;Joint Manipulations;Spinal Manipulations    PT Next Visit Plan strengthening and muscular endurance exercise, neurodynamics, functional strengthening, manual as needed    PT Home Exercise Plan Medbridge Access Code: [email or web address]    Consulted and Agree with Plan of Care Patient           Patient will benefit from skilled therapeutic intervention in order to improve the following deficits and impairments:  Improper body mechanics,Pain,Postural dysfunction,Increased muscle spasms,Decreased coordination,Decreased mobility,Decreased activity tolerance,Decreased endurance,Decreased range of  motion,Decreased strength,Hypomobility,Impaired perceived functional ability,Difficulty walking,Impaired flexibility  Visit Diagnosis: Bilateral low back pain with left-sided sciatica, unspecified chronicity  Difficulty in walking, not elsewhere classified     Problem List Patient Active Problem List   Diagnosis Date Noted  . Has immunity to COVID-19 virus 09/18/2020  . Headache 08/22/2020  . Dizziness 08/22/2020  . Stress 04/20/2020  . Decreased GFR 03/11/2020  . Leg pain 11/04/2019  . Osteopenia 11/04/2018  . Rectal bleeding 06/27/2018  . Joint pain 06/08/2016  . Sleeping difficulty 04/06/2015  . SOB (shortness of breath) 02/02/2015  . Health care maintenance 02/02/2015  . Paraesophageal hiatal hernia 01/21/2015  . Esophageal reflux 01/21/2015  . Hernia of abdominal cavity 10/22/2014  . Back pain 10/13/2014  . Hyperglycemia 10/08/2014  . Abdominal hernia 10/08/2014  . Breast cancer ([place]) 07/09/2014  . Knee pain, right 07/09/2014  . Obstructive sleep apnea 12/23/2013  . Chest pain 10/01/2013  . Hypertension 10/25/2012  . GERD (gastroesophageal reflux disease) 10/25/2012  . Hypercholesterolemia 10/25/2012  . Allergic rhinitis 10/25/2012    Pincus Badder 02/10/2021, 10:40 AM Merdis Delay, PT, DPT   [facility] [address], [place], [address] Phone: [phone]   Fax:  [phone]  Name: KYRENE LONGAN MRN: [id number] Date of Birth: Nov 13, 1946

## 2021-02-12 ENCOUNTER — Ambulatory Visit: Payer: Medicare HMO | Admitting: Physical Therapy

## 2021-02-12 ENCOUNTER — Encounter: Payer: Self-pay | Admitting: Physical Therapy

## 2021-02-12 ENCOUNTER — Other Ambulatory Visit: Payer: Self-pay

## 2021-02-12 DIAGNOSIS — M5442 Lumbago with sciatica, left side: Secondary | ICD-10-CM | POA: Diagnosis not present

## 2021-02-12 DIAGNOSIS — R262 Difficulty in walking, not elsewhere classified: Secondary | ICD-10-CM | POA: Diagnosis not present

## 2021-02-12 NOTE — Therapy (Signed)
[facility] [address], [place], [address] Phone: [phone]   Fax:  [phone]  Physical Therapy Treatment  Patient Details  Name: Carla Ryan MRN: [id number] Date of Birth: [birth year] Referring Provider (PT): Einar Pheasant, MD   Encounter Date: 02/12/2021   PT End of Session - 02/12/21 0953    Visit Number 8    Number of Visits 24    Date for PT Re-Evaluation 03/25/21    Authorization Type [employer] reporting period from 12/31/2020    Authorization Time Period [employer] #[id number] 1/25-3/4 12 PT visits    Authorization - Visit Number 7    Authorization - Number of Visits 12    Progress Note Due on Visit 10    PT Start Time 0946    PT Stop Time 1025    PT Time Calculation (min) 39 min    Activity Tolerance Patient tolerated treatment well           Past Medical History:  Diagnosis Date  . Abdominal hernia   . Anxiety    situational stress and anxiety  . Cancer ([place]) 2015   breast   . Cataract cortical, senile, unspecified laterality   . Chest pain   . Exertional dyspnea   . GERD (gastroesophageal reflux disease)   . Hiatal hernia   . Hiatal hernia   . Hypercholesterolemia   . Hyperlipidemia   . Hypertension   . Osteoarthritis    scoliosis, degenerative disc dz, knee, carpal tunnel  . Sleep apnea     Past Surgical History:  Procedure Laterality Date  . ABDOMINAL HYSTERECTOMY  07/2000  . APPENDECTOMY  2000  . APPENDECTOMY    . BREAST BIOPSY Bilateral 2014  . BREAST SURGERY Right 2015   lumpectomy  . CARPAL TUNNEL RELEASE    . COLONOSCOPY  2011   [name], [name]: Normal exam  . COLONOSCOPY WITH PROPOFOL N/A 04/03/2020   Procedure: COLONOSCOPY WITH PROPOFOL;  Surgeon: Toledo, Benay Pike, MD;  Location: [facility];  Service: Gastroenterology;  Laterality: N/A;  . ESOPHAGOGASTRODUODENOSCOPY (EGD) WITH PROPOFOL N/A 04/03/2020   Procedure:  ESOPHAGOGASTRODUODENOSCOPY (EGD) WITH PROPOFOL;  Surgeon: Toledo, Benay Pike, MD;  Location: [facility];  Service: Gastroenterology;  Laterality: N/A;  . HERNIA REPAIR  02/10/15  . Laparoscopic Repair of Lateral Abdominal Wall Hernia  02/10/15  . MASTECTOMY    . UPPER GI ENDOSCOPY  05/10/2014   [name], [name] Bile gastritis, medium hiatal hernia. Incomplete visualization of the cardia and fundus.    There were no vitals filed for this visit.   Subjective Assessment - 02/12/21 0949    Subjective Patient reports she is feeling well today and says her back pain has been a bit better over the last few days. Standing to do her hair has been a bit less painful but static standing is still the most difficult due to pain. She has been having a little pain in both knees, currently R is 2/10 and a bit worse. She has had this off and on for a long time. She doesnt think it is related to her back.    Pertinent History Patient is a 75 y.o. female who presents to outpatient physical therapy with a referral for medical diagnosis low back pain. This patient's chief complaints consist of episodic low back pain with most recent episode starting Nov/Dec 2021, radiation down left leg above knee, leading to the following functional deficits: difficulty with walking, bending,  gardening, picking strawberries, walking, standing, cooking, walking up hill, etc.  Relevant past medical history and comorbidities include scoliosis,right upper back/shoulder region (referred to Dr. Sharlet Salina), breast cancer in  2015 (takes a hormome therapy, followed by cancer center), fatigue, osteopenia, OSA, GED, abdominal hernia (repair 2016), shortness of breath after eating, hiatial hernia (supposed to see surgeon), dizziness (episodes in Aug 2021), HTN, see chart for additional history.  Patient denies hx of stroke, seizures, lung problem, major cardiac events, diabetes, unexplained weight loss, changes in bowel or bladder problems, new  onset stumbling or dropping things.    Limitations Standing;Walking;Lifting;House hold activities    How long can you sit comfortably? doesn't bother her much    How long can you stand comfortably? 30-60 min in the kitchen but it hurts    How long can you walk comfortably? 10-15 min    Diagnostic tests no recent imaging  Lumbar/thoracic MRI report from 09/09/2014: "Impression:   1. Severe levoscoliosis of the thoracolumbar spine, centered at L1-L2. No   canal or foraminal stenosis in the thoracic spine. Multilevel degenerative   disc disease in the lumbar spine without advanced canal or foraminal   stenosis.   2. No pathologic marrow signal noted. "    Currently in Pain? Yes    Pain Score 2              TREATMENT:  Therapeutic exercise:to centralize symptoms and improve ROM, strength, muscular endurance, and activity tolerance required for successfulcompletion of functional activities.  - NuStep level 2 using bilateral upper and lower extremities. Seat/handle setting 5/7. For improved extremity mobility, muscular endurance, and activity tolerance; and to induce the analgesic effect of aerobic exercise, stimulate improved joint nutrition, and prepare body structures and systems for following interventions. x 5 minutes. Average SPM = 85 - hooklying lower trunk rotation x20 with green theraball under legs. Cuing for abdominal control.  - hooklying double knees to chest with green theraball under feet x 20. Cuing for abdominal control. - straight leg bridge from ball x15, with 5 second hold, hands on abdomen. - hooklying shoulder extenxion -hookyling dead bugs (legs only) alternating movements 2x10, pt able to alternate with fair control today (2nd set with arm) -quadruped arm flexion alternating x3 each side (discontinued due to pain putting weight on R shoulder, cues for long spine -quadruped leg extension alternating x10 each side, cues for long spine, difficulty due to R shoulder pain -  hooklying abdominal brace with LE bicycle,3x10 - standing hip diagonal lateral/back 2x10 each side. Focus on standing tall and abdominals engaged.   Pt required multimodal cuing for proper technique and to facilitate improved neuromuscular control, strength, range of motion, and functional ability resulting in improved performance and form.  HOME EXERCISE PROGRAM Access Code: [email or web address] URL: [email or web address] Date: 02/03/2021 Prepared by: Rosita Kea  Exercises Supine Lower Trunk Rotation - 1 x daily - 1 sets - 20 reps Beginner Flat Bicycle - 1 x daily - 3 sets - 10 reps Prone Hip Extension with Pillow Under Abdomen - 1 x daily - 3 sets - 10 reps    PT Education - 02/12/21 1143    Education Details Exercise purpose/form.    Person(s) Educated Patient    Methods Explanation;Demonstration;Tactile cues;Verbal cues    Comprehension Verbalized understanding;Returned demonstration;Verbal cues required;Tactile cues required;Need further instruction            PT Short Term Goals - 01/13/21 1046      PT SHORT TERM GOAL #1  Title Be independent with initial home exercise program for self-management of symptoms.    Baseline initial HEP provided at IE (12/31/2020);    Time 2    Period Weeks    Status Achieved    Target Date 01/15/21             PT Long Term Goals - 01/13/21 1046      PT LONG TERM GOAL #1   Title Be independent with a long-term home exercise program for self-management of symptoms.    Baseline initial HEP provided at IE (12/31/2020);    Time 12    Period Weeks    Status Partially Met   TARGET DATE FOR ALL LONG TERM GOALS: 03/25/2021     PT LONG TERM GOAL #2   Title Demonstrate improved FOTO score to equal or greater than 66 by visit #11 demonstrate improvement in overall condition and self-reported functional ability.    Baseline 56 (12/31/2020);    Time 12    Period Weeks    Status On-going      PT LONG TERM GOAL #3   Title Patient  will complete 1000 feet during 6 Minute Walk Test to demonstrate improved community mobility.    Baseline to be tested on visit 2 (01/02/2020); 1350 with pain (01/13/2021);    Time 12    Period Weeks    Status Achieved      PT LONG TERM GOAL #4   Title Reduce pain with functional activities to equal or less than 1/10 to allow patient to complete usual activities including ADLs, IADLs, and social engagement with less difficulty.    Baseline 8-9/10 (12/31/2020);    Time 12    Period Weeks    Status On-going      PT LONG TERM GOAL #5   Title Complete community, work and/or recreational activities without limitation due to current condition.    Baseline Functional Limitations: bending, gardening, picking strawberries, walking, standing, cooking, walking up hill, etc (12/31/2020);    Time 12    Period Weeks    Status On-going                 Plan - 02/12/21 1146    Clinical Impression Statement Patient tolerated treatment with some limitation due to R shoulder pain with weight bearing pressure throughout that shoulder or flexion overhead. Discontinued or modified exercises accordingly. Patient demonstrating improved lumbopelvic control but continues to benefit from tactile and verbal cuing for improved core engagement. Does appear to be improving overall. Patient would benefit from continued management of limiting condition by skilled physical therapist to address remaining impairments and functional limitations to work towards stated goals and return to PLOF or maximal functional independence.    Personal Factors and Comorbidities Age;Comorbidity 3+;Past/Current Experience;Fitness;Time since onset of injury/illness/exacerbation    Comorbidities Relevant past medical history and comorbidities include scoliosis,right upper back/shoulder region (referred to Dr. Sharlet Salina), breast cancer in  2015 (takes a hormome therapy, followed by cancer center), fatigue, osteopenia, OSA, GED, abdominal hernia  (repair 2016), shortness of breath after eating, hiatial hernia (supposed to see surgeon), dizziness (episodes in Aug 2021), HTN, see chart for additional history.    Examination-Activity Limitations Caring for Others;Carry;Locomotion Level;Stand;Stairs;Lift;Squat;Sleep;Transfers    Examination-Participation Restrictions Laundry;Cleaning;Community Activity;Yard Work;Interpersonal Relationship   bending, gardening, picking strawberries, walking, standing, cooking, walking up hill, etc   Stability/Clinical Decision Making Stable/Uncomplicated    Rehab Potential Good    PT Frequency 2x / week    PT Duration 12 weeks  PT Treatment/Interventions ADLs/Self Care Home Management;Cryotherapy;Moist Heat;Electrical Stimulation;Therapeutic activities;Therapeutic exercise;Balance training;Neuromuscular re-education;Patient/family education;Manual techniques;Dry needling;Passive range of motion;Joint Manipulations;Spinal Manipulations    PT Next Visit Plan strengthening and muscular endurance exercise, neurodynamics, functional strengthening, manual as needed    PT Home Exercise Plan Medbridge Access Code: [email or web address]    Consulted and Agree with Plan of Care Patient           Patient will benefit from skilled therapeutic intervention in order to improve the following deficits and impairments:  Improper body mechanics,Pain,Postural dysfunction,Increased muscle spasms,Decreased coordination,Decreased mobility,Decreased activity tolerance,Decreased endurance,Decreased range of motion,Decreased strength,Hypomobility,Impaired perceived functional ability,Difficulty walking,Impaired flexibility  Visit Diagnosis: Bilateral low back pain with left-sided sciatica, unspecified chronicity  Difficulty in walking, not elsewhere classified     Problem List Patient Active Problem List   Diagnosis Date Noted  . Has immunity to COVID-19 virus 09/18/2020  . Headache 08/22/2020  . Dizziness 08/22/2020  . Stress  04/20/2020  . Decreased GFR 03/11/2020  . Leg pain 11/04/2019  . Osteopenia 11/04/2018  . Rectal bleeding 06/27/2018  . Joint pain 06/08/2016  . Sleeping difficulty 04/06/2015  . SOB (shortness of breath) 02/02/2015  . Health care maintenance 02/02/2015  . Paraesophageal hiatal hernia 01/21/2015  . Esophageal reflux 01/21/2015  . Hernia of abdominal cavity 10/22/2014  . Back pain 10/13/2014  . Hyperglycemia 10/08/2014  . Abdominal hernia 10/08/2014  . Breast cancer ([place]) 07/09/2014  . Knee pain, right 07/09/2014  . Obstructive sleep apnea 12/23/2013  . Chest pain 10/01/2013  . Hypertension 10/25/2012  . GERD (gastroesophageal reflux disease) 10/25/2012  . Hypercholesterolemia 10/25/2012  . Allergic rhinitis 10/25/2012    Everlean Alstrom. Graylon Good, PT, DPT 02/12/21, 11:47 AM  [facility] [address] [place], [address] Phone: [phone]   Fax:  [phone]  Name: JAMIESON LISA MRN: [id number] Date of Birth: 03/06/1946

## 2021-02-16 ENCOUNTER — Encounter: Payer: BC Managed Care – PPO | Admitting: Physical Therapy

## 2021-02-16 DIAGNOSIS — Z1231 Encounter for screening mammogram for malignant neoplasm of breast: Secondary | ICD-10-CM | POA: Diagnosis not present

## 2021-02-16 DIAGNOSIS — C50911 Malignant neoplasm of unspecified site of right female breast: Secondary | ICD-10-CM | POA: Diagnosis not present

## 2021-02-16 DIAGNOSIS — M8580 Other specified disorders of bone density and structure, unspecified site: Secondary | ICD-10-CM | POA: Diagnosis not present

## 2021-02-16 LAB — HM MAMMOGRAPHY

## 2021-02-18 ENCOUNTER — Ambulatory Visit: Payer: Medicare HMO | Attending: Internal Medicine | Admitting: Physical Therapy

## 2021-02-18 ENCOUNTER — Other Ambulatory Visit: Payer: Self-pay

## 2021-02-18 ENCOUNTER — Encounter: Payer: Self-pay | Admitting: Physical Therapy

## 2021-02-18 DIAGNOSIS — M5442 Lumbago with sciatica, left side: Secondary | ICD-10-CM

## 2021-02-18 DIAGNOSIS — R262 Difficulty in walking, not elsewhere classified: Secondary | ICD-10-CM | POA: Diagnosis not present

## 2021-02-18 NOTE — Therapy (Signed)
[facility] [address], [place], [address] Phone: [phone]   Fax:  [phone]  Physical Therapy Treatment  Patient Details  Name: Carla Ryan MRN: [id number] Date of Birth: Dec 20, 1946 Referring Provider (PT): Einar Pheasant, MD   Encounter Date: 02/18/2021   PT End of Session - 02/18/21 1521    Visit Number 9    Number of Visits 24    Date for PT Re-Evaluation 03/25/21    Authorization Type [employer] reporting period from 12/31/2020    Authorization Time Period [employer] #[id number] 1/25-3/4 12 PT visits    Authorization - Visit Number 8    Authorization - Number of Visits 12    Progress Note Due on Visit 10    PT Start Time 1324    PT Stop Time 1555    PT Time Calculation (min) 38 min    Activity Tolerance Patient tolerated treatment well           Past Medical History:  Diagnosis Date  . Abdominal hernia   . Anxiety    situational stress and anxiety  . Cancer ([place]) 2015   breast   . Cataract cortical, senile, unspecified laterality   . Chest pain   . Exertional dyspnea   . GERD (gastroesophageal reflux disease)   . Hiatal hernia   . Hiatal hernia   . Hypercholesterolemia   . Hyperlipidemia   . Hypertension   . Osteoarthritis    scoliosis, degenerative disc dz, knee, carpal tunnel  . Sleep apnea     Past Surgical History:  Procedure Laterality Date  . ABDOMINAL HYSTERECTOMY  07/2000  . APPENDECTOMY  2000  . APPENDECTOMY    . BREAST BIOPSY Bilateral 2014  . BREAST SURGERY Right 2015   lumpectomy  . CARPAL TUNNEL RELEASE    . COLONOSCOPY  2011   Carla Ryan, [name]: Normal exam  . COLONOSCOPY WITH PROPOFOL N/A 04/03/2020   Procedure: COLONOSCOPY WITH PROPOFOL;  Surgeon: Toledo, Benay Pike, MD;  Location: [facility];  Service: Gastroenterology;  Laterality: N/A;  . ESOPHAGOGASTRODUODENOSCOPY (EGD) WITH PROPOFOL N/A 04/03/2020   Procedure:  ESOPHAGOGASTRODUODENOSCOPY (EGD) WITH PROPOFOL;  Surgeon: Toledo, Benay Pike, MD;  Location: [facility];  Service: Gastroenterology;  Laterality: N/A;  . HERNIA REPAIR  02/10/15  . Laparoscopic Repair of Lateral Abdominal Wall Hernia  02/10/15  . MASTECTOMY    . UPPER GI ENDOSCOPY  05/10/2014   Carla Ryan, [name] Bile gastritis, medium hiatal hernia. Incomplete visualization of the cardia and fundus.    There were no vitals filed for this visit.   Subjective Assessment - 02/18/21 1519    Subjective Patient reports she has 2/10 pain in the left glute region upon arrival. States today is later in the day than she usually comes to PT and this is when her pain is usually worse. 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 R shoulder continues to bother her and disrupts sleep.    Pertinent History Patient is a 75 y.o. female who presents to outpatient physical therapy with a referral for medical diagnosis low back pain. This patient's chief complaints consist of episodic low back pain with most recent episode starting Nov/Dec 2021, radiation down left leg above knee, leading to the following functional deficits: difficulty with walking, bending, gardening, picking strawberries,  walking, standing, cooking, walking up hill, etc.  Relevant past medical history and comorbidities include scoliosis,right upper back/shoulder region (referred to Dr. Sharlet Salina), breast cancer in  2015 (takes a hormome therapy, followed by cancer center), fatigue, osteopenia, OSA, GED, abdominal hernia (repair 2016), shortness of breath after eating, hiatial hernia (supposed to see surgeon), dizziness (episodes in Aug 2021), HTN, see chart for additional history.  Patient denies hx of stroke, seizures, lung problem, major cardiac events, diabetes, unexplained weight loss, changes in bowel or bladder problems, new  onset stumbling or dropping things.    Limitations Standing;Walking;Lifting;House hold activities    How long can you sit comfortably? doesn't bother her much    How long can you stand comfortably? 30-60 min in the kitchen but it hurts    How long can you walk comfortably? 10-15 min    Diagnostic tests no recent imaging  Lumbar/thoracic MRI report from 09/09/2014: "Impression:   1. Severe levoscoliosis of the thoracolumbar spine, centered at L1-L2. No   canal or foraminal stenosis in the thoracic spine. Multilevel degenerative   disc disease in the lumbar spine without advanced canal or foraminal   stenosis.   2. No pathologic marrow signal noted. "    Currently in Pain? Yes    Pain Score 2             TREATMENT:  Therapeutic exercise:to centralize symptoms and improve ROM, strength, muscular endurance, and activity tolerance required for successfulcompletion of functional activities. - hooklying lower trunk rotation x20 with green theraball under legs. Cuing for abdominal control.  - hooklying double knees to chest with green theraball under feet x 20. Cuing for abdominal control. - straight leg bridge from ball x20, with 5 second hold, hands on abdomen. -hookyling dead bugs holding ball between legs and arms obove abdomen,alternating movements with arm release but not reach on R to avoid R shoulder pain. 1x15, - dead bug with green/yellow  theraball between hands and knees, 3x~5, Increasing R shoulder pain even with minimal flexion so discontinued. Very challenging.  (manual therapy - see below) - prone abdominal brace with hip extension (multifidus kick), 3x10 each side. Pillow under abdomen for comfort. (recommend adding 2# AW next session).   Manual therapy: to reduce pain and tissue tension, improve range of motion, neuromodulation, in order to promote improved ability to complete functional activities. - prone STM to left glute max, piriformis (concordant!), and left lower lumbar  paraspinals and QL. (tight and tender).    Pt required multimodal cuing for proper technique and to facilitate improved neuromuscular control, strength, range of motion, and functional ability resulting in improved performance and form.  HOME EXERCISE PROGRAM Access Code: [email or web address] URL: [email or web address] Date: 02/03/2021 Prepared by: Rosita Kea  Exercises Supine Lower Trunk Rotation - 1 x daily - 1 sets - 20 reps Beginner Flat Bicycle - 1 x daily - 3 sets - 10 reps Prone Hip Extension with Pillow Under Abdomen - 1 x daily - 3 sets - 10 reps    PT Education - 02/18/21 1520    Education Details Exercise purpose/form.    Person(s) Educated Patient    Methods Explanation;Demonstration;Tactile cues;Verbal cues    Comprehension Verbalized understanding;Returned demonstration;Verbal cues required;Tactile cues required;Need further instruction            PT Short Term Goals - 01/13/21 1046      PT SHORT TERM GOAL #1   Title Be independent with initial home exercise program for self-management of symptoms.  Baseline initial HEP provided at IE (12/31/2020);    Time 2    Period Weeks    Status Achieved    Target Date 01/15/21             PT Long Term Goals - 01/13/21 1046      PT LONG TERM GOAL #1   Title Be independent with a long-term home exercise program for self-management of symptoms.    Baseline initial HEP provided at IE (12/31/2020);    Time 12    Period Weeks    Status Partially Met   TARGET DATE FOR ALL LONG TERM GOALS: 03/25/2021     PT LONG TERM GOAL #2   Title Demonstrate improved FOTO score to equal or greater than 66 by visit #11 demonstrate improvement in overall condition and self-reported functional ability.    Baseline 56 (12/31/2020);    Time 12    Period Weeks    Status On-going      PT LONG TERM GOAL #3   Title Patient will complete 1000 feet during 6 Minute Walk Test to demonstrate improved community mobility.    Baseline to  be tested on visit 2 (01/02/2020); 1350 with pain (01/13/2021);    Time 12    Period Weeks    Status Achieved      PT LONG TERM GOAL #4   Title Reduce pain with functional activities to equal or less than 1/10 to allow patient to complete usual activities including ADLs, IADLs, and social engagement with less difficulty.    Baseline 8-9/10 (12/31/2020);    Time 12    Period Weeks    Status On-going      PT LONG TERM GOAL #5   Title Complete community, work and/or recreational activities without limitation due to current condition.    Baseline Functional Limitations: bending, gardening, picking strawberries, walking, standing, cooking, walking up hill, etc (12/31/2020);    Time 12    Period Weeks    Status On-going                 Plan - 02/18/21 1606    Clinical Impression Statement Patient tolerated treatment well overall except was limited by R shoulder pain. Exercises modified to accommodate R shoulder pain. Patient tight and tender in L glute region and reported resolution of pain by end of session. Patient would benefit from continued management of limiting condition by skilled physical therapist to address remaining impairments and functional limitations to work towards stated goals and return to PLOF or maximal functional independence.    Personal Factors and Comorbidities Age;Comorbidity 3+;Past/Current Experience;Fitness;Time since onset of injury/illness/exacerbation    Comorbidities Relevant past medical history and comorbidities include scoliosis,right upper back/shoulder region (referred to Dr. Sharlet Salina), breast cancer in  2015 (takes a hormome therapy, followed by cancer center), fatigue, osteopenia, OSA, GED, abdominal hernia (repair 2016), shortness of breath after eating, hiatial hernia (supposed to see surgeon), dizziness (episodes in Aug 2021), HTN, see chart for additional history.    Examination-Activity Limitations Caring for Others;Carry;Locomotion  Level;Stand;Stairs;Lift;Squat;Sleep;Transfers    Examination-Participation Restrictions Laundry;Cleaning;Community Activity;Yard Work;Interpersonal Relationship   bending, gardening, picking strawberries, walking, standing, cooking, walking up hill, etc   Stability/Clinical Decision Making Stable/Uncomplicated    Rehab Potential Good    PT Frequency 2x / week    PT Duration 12 weeks    PT Treatment/Interventions ADLs/Self Care Home Management;Cryotherapy;Moist Heat;Electrical Stimulation;Therapeutic activities;Therapeutic exercise;Balance training;Neuromuscular re-education;Patient/family education;Manual techniques;Dry needling;Passive range of motion;Joint Manipulations;Spinal Manipulations    PT Next Visit  Plan strengthening and muscular endurance exercise, neurodynamics, functional strengthening, manual as needed    PT Home Exercise Plan Medbridge Access Code: [email or web address]    Consulted and Agree with Plan of Care Patient           Patient will benefit from skilled therapeutic intervention in order to improve the following deficits and impairments:  Improper body mechanics,Pain,Postural dysfunction,Increased muscle spasms,Decreased coordination,Decreased mobility,Decreased activity tolerance,Decreased endurance,Decreased range of motion,Decreased strength,Hypomobility,Impaired perceived functional ability,Difficulty walking,Impaired flexibility  Visit Diagnosis: Bilateral low back pain with left-sided sciatica, unspecified chronicity  Difficulty in walking, not elsewhere classified     Problem List Patient Active Problem List   Diagnosis Date Noted  . Has immunity to COVID-19 virus 09/18/2020  . Headache 08/22/2020  . Dizziness 08/22/2020  . Stress 04/20/2020  . Decreased GFR 03/11/2020  . Leg pain 11/04/2019  . Osteopenia 11/04/2018  . Rectal bleeding 06/27/2018  . Joint pain 06/08/2016  . Sleeping difficulty 04/06/2015  . SOB (shortness of breath) 02/02/2015  . Health care  maintenance 02/02/2015  . Paraesophageal hiatal hernia 01/21/2015  . Esophageal reflux 01/21/2015  . Hernia of abdominal cavity 10/22/2014  . Back pain 10/13/2014  . Hyperglycemia 10/08/2014  . Abdominal hernia 10/08/2014  . Breast cancer ([place]) 07/09/2014  . Knee pain, right 07/09/2014  . Obstructive sleep apnea 12/23/2013  . Chest pain 10/01/2013  . Hypertension 10/25/2012  . GERD (gastroesophageal reflux disease) 10/25/2012  . Hypercholesterolemia 10/25/2012  . Allergic rhinitis 10/25/2012    Everlean Alstrom. Graylon Good, PT, DPT 02/18/21, 4:07 PM  [facility] [address], [place], [address] Phone: [phone]   Fax:  [phone]  Name: DUANA BENEDICT MRN: [id number] Date of Birth: 13-May-1946

## 2021-02-23 ENCOUNTER — Other Ambulatory Visit: Payer: Self-pay

## 2021-02-23 ENCOUNTER — Ambulatory Visit: Payer: Medicare HMO | Admitting: Physical Therapy

## 2021-02-23 ENCOUNTER — Encounter: Payer: Self-pay | Admitting: Physical Therapy

## 2021-02-23 DIAGNOSIS — R262 Difficulty in walking, not elsewhere classified: Secondary | ICD-10-CM | POA: Diagnosis not present

## 2021-02-23 DIAGNOSIS — M5442 Lumbago with sciatica, left side: Secondary | ICD-10-CM

## 2021-02-23 NOTE — Therapy (Signed)
[place] [facility] [address], [place], [address] Phone: [phone]   Fax:  [phone]  Physical Therapy Treatment / Progress Note Dates of reporting: 12/31/2020 to 02/23/2021  Patient Details  Name: Carla Ryan MRN: [id number] Date of Birth: January 27, 1946 Referring Provider (PT): Einar Pheasant, MD   Encounter Date: 02/23/2021   PT End of Session - 02/23/21 1302    Visit Number 10    Number of Visits 24    Date for PT Re-Evaluation 03/25/21    Authorization Type [employer] reporting period from 12/31/2020    Authorization Time Period [employer] #[id number] 1/25-3/4 12 PT visits    Authorization - Visit Number 9    Authorization - Number of Visits 12    Progress Note Due on Visit 10    PT Start Time 1117    PT Stop Time 1200    PT Time Calculation (min) 43 min    Activity Tolerance Patient tolerated treatment well    Behavior During Therapy [facility] for tasks assessed/performed           Past Medical History:  Diagnosis Date  . Abdominal hernia   . Anxiety    situational stress and anxiety  . Cancer ([place]) 2015   breast   . Cataract cortical, senile, unspecified laterality   . Chest pain   . Exertional dyspnea   . GERD (gastroesophageal reflux disease)   . Hiatal hernia   . Hiatal hernia   . Hypercholesterolemia   . Hyperlipidemia   . Hypertension   . Osteoarthritis    scoliosis, degenerative disc dz, knee, carpal tunnel  . Sleep apnea     Past Surgical History:  Procedure Laterality Date  . ABDOMINAL HYSTERECTOMY  07/2000  . APPENDECTOMY  2000  . APPENDECTOMY    . BREAST BIOPSY Bilateral 2014  . BREAST SURGERY Right 2015   lumpectomy  . CARPAL TUNNEL RELEASE    . COLONOSCOPY  2011   [name], [name]: Normal exam  . COLONOSCOPY WITH PROPOFOL N/A 04/03/2020   Procedure: COLONOSCOPY WITH PROPOFOL;  Surgeon: Toledo, Benay Pike, MD;  Location: [facility] ENDOSCOPY;  Service: Gastroenterology;   Laterality: N/A;  . ESOPHAGOGASTRODUODENOSCOPY (EGD) WITH PROPOFOL N/A 04/03/2020   Procedure: ESOPHAGOGASTRODUODENOSCOPY (EGD) WITH PROPOFOL;  Surgeon: Toledo, Benay Pike, MD;  Location: [facility] ENDOSCOPY;  Service: Gastroenterology;  Laterality: N/A;  . HERNIA REPAIR  02/10/15  . Laparoscopic Repair of Lateral Abdominal Wall Hernia  02/10/15  . MASTECTOMY    . UPPER GI ENDOSCOPY  05/10/2014   [name], [name] Bile gastritis, medium hiatal hernia. Incomplete visualization of the cardia and fundus.    There were no vitals filed for this visit.   Subjective Assessment - 02/23/21 1126    Subjective Patient reports she continues to have pain in her left low back and posterior hip/glute region, occaionally down inter her left thigh. States she thinks she may be improving a little. 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 She was also able to do yardwork for quite  while without increased pain during the activity (which she was before) but then had pain for 2 days later afterwards. Notes she wants to start a walking progam with her husband. Has had difficulty with hills in the past and her [place]. Her left glute was sore following manual therapy last session.  Pertinent History Patient is a 75 y.o. female who presents to outpatient physical therapy with a referral for medical diagnosis low back pain. This patient's chief complaints consist of episodic low back pain with most recent episode starting Nov/Dec 2021, radiation down left leg above knee, leading to the following functional deficits: difficulty with walking, bending, gardening, picking strawberries, walking, standing, cooking, walking up hill, etc.  Relevant past medical history and comorbidities include scoliosis,right upper back/shoulder region (referred to Dr. Sharlet Salina), breast cancer in  2015 (takes a hormome therapy, followed  by cancer center), fatigue, osteopenia, OSA, GED, abdominal hernia (repair 2016), shortness of breath after eating, hiatial hernia (supposed to see surgeon), dizziness (episodes in Aug 2021), HTN, see chart for additional history.  Patient denies hx of stroke, seizures, lung problem, major cardiac events, diabetes, unexplained weight loss, changes in bowel or bladder problems, new onset stumbling or dropping things.    Limitations Standing;Walking;Lifting;House hold activities    How long can you sit comfortably? doesn't bother her much    How long can you stand comfortably? 30-60 min in the kitchen but it hurts    How long can you walk comfortably? 10-15 min    Diagnostic tests no recent imaging  Lumbar/thoracic MRI report from 09/09/2014: "Impression:   1. Severe levoscoliosis of the thoracolumbar spine, centered at L1-L2. No   canal or foraminal stenosis in the thoracic spine. Multilevel degenerative   disc disease in the lumbar spine without advanced canal or foraminal   stenosis.   2. No pathologic marrow signal noted. "    Currently in Pain? Yes    Pain Score 1     Pain Location Back          OBJECTIVE  6 Minute Walk Test: no AD. Wearing flip flops. Pain at left glute only during the last 3 laps. 1554 feet.   FOTO = 50 (02/23/2021)    TREATMENT: Therapeutic exercise:to centralize symptoms and improve ROM, strength, muscular endurance, and activity tolerance required for successfulcompletion of functional activities. - Ambulation around clinic for distance in 6 minutes - mini squat over chair with braced trunk 3x10 - attempted multifidus press with single red theraband, but unable due to R shoulder pain.  - Standing rows with scapular retraction for improved postural and shoulder girdle strengthening and mobility. 1x20 with red theraband. Began to feel burning over left thoracic and periscapular region by end of set that did not resolve quickly.  - standing single arm row with red  theraband, 2x10 each side. No further provocation of any pain, but continues to feel pain at left scapular region from prior exercise.  - Education on HEP including handout   Pt required multimodal cuing for proper technique and to facilitate improved neuromuscular control, strength, range of motion, and functional ability resulting in improved performance and form.  HOME EXERCISE PROGRAM Access Code: [email or web address] URL: [email or web address] Date: 02/23/2021 Prepared by: Rosita Kea  Exercises Supine Lower Trunk Rotation - 1 x daily - 1 sets - 20 reps Beginner Flat Bicycle - 1 x daily - 3 sets - 10 reps Prone Hip Extension with Pillow Under Abdomen - 1 x daily - 3 sets - 10 reps Single arm row with band/cable - 1 x daily - 2-3 sets - 10 reps Squat with Chair Touch - 3 x weekly - 2-3 sets - 10 reps   PT Education - 02/23/21 1302    Education Details Exercise purpose/form. POC    Person(s) Educated Patient  Methods Explanation;Demonstration;Tactile cues;Verbal cues;Handout    Comprehension Verbalized understanding;Returned demonstration;Verbal cues required;Tactile cues required;Need further instruction            PT Short Term Goals - 01/13/21 1046      PT SHORT TERM GOAL #1   Title Be independent with initial home exercise program for self-management of symptoms.    Baseline initial HEP provided at IE (12/31/2020);    Time 2    Period Weeks    Status Achieved    Target Date 01/15/21             PT Long Term Goals - 02/23/21 1437      PT LONG TERM GOAL #1   Title Be independent with a long-term home exercise program for self-management of symptoms.    Baseline initial HEP provided at IE (12/31/2020); patient currently participating well (02/23/2021);    Time 12    Period Weeks    Status Partially Met   TARGET DATE FOR ALL LONG TERM GOALS: 03/25/2021     PT LONG TERM GOAL #2   Title Demonstrate improved FOTO score to equal or greater than 66 by visit #11  demonstrate improvement in overall condition and self-reported functional ability.    Baseline 56 (12/31/2020); 50 at visit 10 (02/23/2021);    Time 12    Period Weeks    Status On-going      PT LONG TERM GOAL #3   Title Patient will complete 1000 feet during 6 Minute Walk Test to demonstrate improved community mobility.    Baseline to be tested on visit 2 (01/02/2020); 1350 with pain (01/13/2021); 1554 feet with pain starting at only the last 3 laps (02/23/2021);    Time 12    Period Weeks    Status Achieved      PT LONG TERM GOAL #4   Title Reduce pain with functional activities to equal or less than 1/10 to allow patient to complete usual activities including ADLs, IADLs, and social engagement with less difficulty.    Baseline 8-9/10 (12/31/2020); up to 4/10 (02/23/2021);    Time 12    Period Weeks    Status Partially Met      PT LONG TERM GOAL #5   Title Complete community, work and/or recreational activities without limitation due to current condition.    Baseline Functional Limitations: bending, gardening, picking strawberries, walking, standing, cooking, walking up hill, etc (12/31/2020); improved standing while doing hair, improved bending and yardwork during activity but still very bothersome afterwards, continues to have some trouble walking up a hill (02/23/2021);    Time 12    Period Weeks    Status Partially Met                 Plan - 02/23/21 1443    Clinical Impression Statement Patient has attended 10 physical therapy sessions this episode of care. She is making progress towards most goals and has met her 6MWT goal. She did not show progress on FOTO score which is inconsistent with her report of improved function for standing to do her hair and complete gardening/yardwork activities with less pain. She does report she continues to have pain and difficulty with walking (especially uphill) and after performing repeated bending such as when gardening/yardwork. Discussed  continuing PT to improve tolerance for functional activities and started to progress to more standing activities. Patient demo good abdominal brace and trunk control but did have some difficulty with pain in the left thoracic region with anti-rotation  exercises. Will continue to assess and progress as appropriate at future sessions.  Patient would benefit from continued management of limiting condition by skilled physical therapist to address remaining impairments and functional limitations to work towards stated goals and return to PLOF or maximal functional independence.    Personal Factors and Comorbidities Age;Comorbidity 3+;Past/Current Experience;Fitness;Time since onset of injury/illness/exacerbation    Comorbidities Relevant past medical history and comorbidities include scoliosis,right upper back/shoulder region (referred to Dr. Sharlet Salina), breast cancer in  2015 (takes a hormome therapy, followed by cancer center), fatigue, osteopenia, OSA, GED, abdominal hernia (repair 2016), shortness of breath after eating, hiatial hernia (supposed to see surgeon), dizziness (episodes in Aug 2021), HTN, see chart for additional history.    Examination-Activity Limitations Caring for Others;Carry;Locomotion Level;Stand;Stairs;Lift;Squat;Sleep;Transfers    Examination-Participation Restrictions Laundry;Cleaning;Community Activity;Yard Work;Interpersonal Relationship   bending, gardening, picking strawberries, walking, standing, cooking, walking up hill, etc   Stability/Clinical Decision Making Stable/Uncomplicated    Rehab Potential Good    PT Frequency 2x / week    PT Duration 12 weeks    PT Treatment/Interventions ADLs/Self Care Home Management;Cryotherapy;Moist Heat;Electrical Stimulation;Therapeutic activities;Therapeutic exercise;Balance training;Neuromuscular re-education;Patient/family education;Manual techniques;Dry needling;Passive range of motion;Joint Manipulations;Spinal Manipulations    PT Next Visit  Plan strengthening and muscular endurance exercise, neurodynamics, functional strengthening, manual as needed    PT Home Exercise Plan Medbridge Access Code: [email or web address]    Consulted and Agree with Plan of Care Patient           Patient will benefit from skilled therapeutic intervention in order to improve the following deficits and impairments:  Improper body mechanics,Pain,Postural dysfunction,Increased muscle spasms,Decreased coordination,Decreased mobility,Decreased activity tolerance,Decreased endurance,Decreased range of motion,Decreased strength,Hypomobility,Impaired perceived functional ability,Difficulty walking,Impaired flexibility  Visit Diagnosis: Bilateral low back pain with left-sided sciatica, unspecified chronicity  Difficulty in walking, not elsewhere classified     Problem List Patient Active Problem List   Diagnosis Date Noted  . Has immunity to COVID-19 virus 09/18/2020  . Headache 08/22/2020  . Dizziness 08/22/2020  . Stress 04/20/2020  . Decreased GFR 03/11/2020  . Leg pain 11/04/2019  . Osteopenia 11/04/2018  . Rectal bleeding 06/27/2018  . Joint pain 06/08/2016  . Sleeping difficulty 04/06/2015  . SOB (shortness of breath) 02/02/2015  . Health care maintenance 02/02/2015  . Paraesophageal hiatal hernia 01/21/2015  . Esophageal reflux 01/21/2015  . Hernia of abdominal cavity 10/22/2014  . Back pain 10/13/2014  . Hyperglycemia 10/08/2014  . Abdominal hernia 10/08/2014  . Breast cancer ([place]) 07/09/2014  . Knee pain, right 07/09/2014  . Obstructive sleep apnea 12/23/2013  . Chest pain 10/01/2013  . Hypertension 10/25/2012  . GERD (gastroesophageal reflux disease) 10/25/2012  . Hypercholesterolemia 10/25/2012  . Allergic rhinitis 10/25/2012    Everlean Alstrom. Graylon Good, PT, DPT 02/23/21, 2:45 PM  [facility] [address], [place], [address] Phone: [phone]   Fax:   [phone]  Name: DONNAMARIA SHANDS MRN: [id number] Date of Birth: 02-Jun-1946

## 2021-02-23 NOTE — Progress Notes (Signed)
I am ok to continue PT.  What do you need me to do?  Thank you.

## 2021-02-24 ENCOUNTER — Encounter: Payer: BC Managed Care – PPO | Admitting: Physical Therapy

## 2021-02-26 ENCOUNTER — Ambulatory Visit: Payer: Medicare HMO | Admitting: Physical Therapy

## 2021-02-26 ENCOUNTER — Other Ambulatory Visit: Payer: Self-pay

## 2021-02-26 ENCOUNTER — Encounter: Payer: Self-pay | Admitting: Physical Therapy

## 2021-02-26 DIAGNOSIS — M5442 Lumbago with sciatica, left side: Secondary | ICD-10-CM

## 2021-02-26 DIAGNOSIS — R262 Difficulty in walking, not elsewhere classified: Secondary | ICD-10-CM | POA: Diagnosis not present

## 2021-02-26 DIAGNOSIS — G4733 Obstructive sleep apnea (adult) (pediatric): Secondary | ICD-10-CM | POA: Diagnosis not present

## 2021-02-26 NOTE — Therapy (Signed)
[facility] [address] [place], [address] Phone: [phone]   Fax:  [phone]  Physical Therapy Treatment  Patient Details  Name: Carla Ryan MRN: [id number] Date of Birth: 1946-01-14 Referring Provider (PT): Einar Pheasant, MD   Encounter Date: 02/26/2021   PT End of Session - 02/26/21 1057    Visit Number 11    Number of Visits 24    Date for PT Re-Evaluation 03/25/21    Authorization Type [employer] reporting period from 02/23/2021    Authorization Time Period [employer] #[id number] 1/25-3/4 12 PT visits    Authorization - Visit Number 10    Authorization - Number of Visits 12    Progress Note Due on Visit 10    PT Start Time 0945    PT Stop Time 1028    PT Time Calculation (min) 43 min    Activity Tolerance Patient tolerated treatment well    Behavior During Therapy [facility] for tasks assessed/performed           Past Medical History:  Diagnosis Date  . Abdominal hernia   . Anxiety    situational stress and anxiety  . Cancer ([place]) 2015   breast   . Cataract cortical, senile, unspecified laterality   . Chest pain   . Exertional dyspnea   . GERD (gastroesophageal reflux disease)   . Hiatal hernia   . Hiatal hernia   . Hypercholesterolemia   . Hyperlipidemia   . Hypertension   . Osteoarthritis    scoliosis, degenerative disc dz, knee, carpal tunnel  . Sleep apnea     Past Surgical History:  Procedure Laterality Date  . ABDOMINAL HYSTERECTOMY  07/2000  . APPENDECTOMY  2000  . APPENDECTOMY    . BREAST BIOPSY Bilateral 2014  . BREAST SURGERY Right 2015   lumpectomy  . CARPAL TUNNEL RELEASE    . COLONOSCOPY  2011   [name], [name]: Normal exam  . COLONOSCOPY WITH PROPOFOL N/A 04/03/2020   Procedure: COLONOSCOPY WITH PROPOFOL;  Surgeon: Toledo, Benay Pike, MD;  Location: [facility] ENDOSCOPY;  Service: Gastroenterology;  Laterality: N/A;  . ESOPHAGOGASTRODUODENOSCOPY (EGD)  WITH PROPOFOL N/A 04/03/2020   Procedure: ESOPHAGOGASTRODUODENOSCOPY (EGD) WITH PROPOFOL;  Surgeon: Toledo, Benay Pike, MD;  Location: [facility] ENDOSCOPY;  Service: Gastroenterology;  Laterality: N/A;  . HERNIA REPAIR  02/10/15  . Laparoscopic Repair of Lateral Abdominal Wall Hernia  02/10/15  . MASTECTOMY    . UPPER GI ENDOSCOPY  05/10/2014   [name], [name] Bile gastritis, medium hiatal hernia. Incomplete visualization of the cardia and fundus.    There were no vitals filed for this visit.   Subjective Assessment - 02/26/21 0950    Subjective Patient reports she has 1/10 pain in her left lumbar/thoracic region upon arrival. States it was worse this morning going down her left leg some. 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 Was pretty sore down the backs of both legs both sides slightly below her knees and it is still mildly there. It was pretty bad tuesday. She didn't do much Tuesday because she was so sore plus extra pain in her usual areas. She tried her HEP on Wednesday and had some difficulty with the single arm row due to the instructions and some R shoulder pain. Completed on left though. She did walking around her home and some stairs for about 12 min and that incrased her pain but much  more temporarily and resolved with some seated rest. She feels like she can deal with some soreness as long as it's not too bad. Did squats on tuesday and tried row on tuesday. She stopped due to pain at the left knee and leg.    Pertinent History Patient is a 75 y.o. female who presents to outpatient physical therapy with a referral for medical diagnosis low back pain. This patient's chief complaints consist of episodic low back pain with most recent episode starting Nov/Dec 2021, radiation down left leg above knee, leading to the following functional deficits: difficulty with walking, bending, gardening, picking strawberries, walking, standing, cooking, walking up hill, etc.   Relevant past medical history and comorbidities include scoliosis,right upper back/shoulder region (referred to Dr. Sharlet Salina), breast cancer in  2015 (takes a hormome therapy, followed by cancer center), fatigue, osteopenia, OSA, GED, abdominal hernia (repair 2016), shortness of breath after eating, hiatial hernia (supposed to see surgeon), dizziness (episodes in Aug 2021), HTN, see chart for additional history.  Patient denies hx of stroke, seizures, lung problem, major cardiac events, diabetes, unexplained weight loss, changes in bowel or bladder problems, new onset stumbling or dropping things.    Limitations Standing;Walking;Lifting;House hold activities    How long can you sit comfortably? doesn't bother her much    How long can you stand comfortably? 30-60 min in the kitchen but it hurts    How long can you walk comfortably? 10-15 min    Diagnostic tests no recent imaging  Lumbar/thoracic MRI report from 09/09/2014: "Impression:   1. Severe levoscoliosis of the thoracolumbar spine, centered at L1-L2. No   canal or foraminal stenosis in the thoracic spine. Multilevel degenerative   disc disease in the lumbar spine without advanced canal or foraminal   stenosis.   2. No pathologic marrow signal noted. "    Currently in Pain? Yes    Pain Score 1            OBJECTIVE    FOTO = 50 (02/23/2021)  TREATMENT: Therapeutic exercise:to centralize symptoms and improve ROM, strength, muscular endurance, and activity tolerance required for successfulcompletion of functional activities. - hooklying lower trunk rotation x20 with green theraball under legs. Cuing for abdominal control.  - hooklying double knees to chest with green theraball under feet x 20. Cuing for abdominal control. - straight leg bridge from ball x20, with 5 second hold, hands across chest.  - standing single arm row with red theraband, 3x10-20 each side. Reveiwed set up and form. Good tolerance. - seated on theraball  autoelongation exercise with breathing timed with pressing sticks into the ground with neutral spine, practice x 5 min. (increased R low back pain).  - standing squat over plinth with pressure through B UE in two PVC pole on the floor to support torso and improve form, 1x10 (increased R shoulder pain)  - standing squat over plinth with hands on hips pressing down with lats activated to improve trunk support. 1x10. Patient reports improved comfort compared to alternate hand positions.  - review of HEP including decreasing daily squats to 1x10 to gradually build up to 3 sets as tolerated.   Pt required multimodal cuing for proper technique and to facilitate improved neuromuscular control, strength, range of motion, and functional ability resulting in improved performance and form.  HOME EXERCISE PROGRAM Access Code: [email or web address] URL: [email or web address] Date: 02/23/2021 Prepared by: Rosita Kea  Exercises Supine Lower Trunk Rotation - 1 x daily - 1 sets - 20 reps Beginner Flat  Bicycle - 1 x daily - 3 sets - 10 reps Prone Hip Extension with Pillow Under Abdomen - 1 x daily - 3 sets - 10 reps Single arm row with band/cable - 1 x daily - 2-3 sets - 10 reps Squat with Chair Touch - 3 x weekly - 2-3 sets - 10 reps    PT Education - 02/26/21 1057    Education Details Exercise purpose/form.    Person(s) Educated Patient    Methods Explanation;Demonstration;Tactile cues;Verbal cues    Comprehension Verbalized understanding;Returned demonstration;Verbal cues required;Tactile cues required;Need further instruction            PT Short Term Goals - 01/13/21 1046      PT SHORT TERM GOAL #1   Title Be independent with initial home exercise program for self-management of symptoms.    Baseline initial HEP provided at IE (12/31/2020);    Time 2    Period Weeks    Status Achieved    Target Date 01/15/21             PT Long Term Goals - 02/23/21 1437      PT LONG TERM GOAL #1    Title Be independent with a long-term home exercise program for self-management of symptoms.    Baseline initial HEP provided at IE (12/31/2020); patient currently participating well (02/23/2021);    Time 12    Period Weeks    Status Partially Met   TARGET DATE FOR ALL LONG TERM GOALS: 03/25/2021     PT LONG TERM GOAL #2   Title Demonstrate improved FOTO score to equal or greater than 66 by visit #11 demonstrate improvement in overall condition and self-reported functional ability.    Baseline 56 (12/31/2020); 50 at visit 10 (02/23/2021);    Time 12    Period Weeks    Status On-going      PT LONG TERM GOAL #3   Title Patient will complete 1000 feet during 6 Minute Walk Test to demonstrate improved community mobility.    Baseline to be tested on visit 2 (01/02/2020); 1350 with pain (01/13/2021); 1554 feet with pain starting at only the last 3 laps (02/23/2021);    Time 12    Period Weeks    Status Achieved      PT LONG TERM GOAL #4   Title Reduce pain with functional activities to equal or less than 1/10 to allow patient to complete usual activities including ADLs, IADLs, and social engagement with less difficulty.    Baseline 8-9/10 (12/31/2020); up to 4/10 (02/23/2021);    Time 12    Period Weeks    Status Partially Met      PT LONG TERM GOAL #5   Title Complete community, work and/or recreational activities without limitation due to current condition.    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 Patient responded well to schroth inspired exercises to improve muscular support throughout trunk. Did have difficulty with coordination and activation  and fatigued quickly but demonstrated improved performance within session with cuing and was able to squat more comfortably with hands on hips following practice for improved trunk stability. Patient would benefit from continued management of limiting condition by skilled physical therapist to address remaining impairments and functional limitations to work towards stated goals and return to PLOF or maximal functional independence    Personal Factors and Comorbidities Age;Comorbidity 3+;Past/Current Experience;Fitness;Time since onset of injury/illness/exacerbation    Comorbidities Relevant past medical history and comorbidities include scoliosis,right upper back/shoulder region (referred to Dr. Sharlet Salina), breast cancer in  2015 (takes a hormome therapy, followed by cancer center), fatigue, osteopenia, OSA, GED, abdominal hernia (repair 2016), shortness of breath after eating, hiatial hernia (supposed to see surgeon), dizziness (episodes in Aug 2021), HTN, see chart for additional history.    Examination-Activity Limitations Caring for Others;Carry;Locomotion Level;Stand;Stairs;Lift;Squat;Sleep;Transfers    Examination-Participation Restrictions Laundry;Cleaning;Community Activity;Yard Work;Interpersonal Relationship   bending, gardening, picking strawberries, walking, standing, cooking, walking up hill, etc   Stability/Clinical Decision Making Stable/Uncomplicated    Rehab Potential Good    PT Frequency 2x / week    PT Duration 12 weeks    PT Treatment/Interventions ADLs/Self Care Home Management;Cryotherapy;Moist Heat;Electrical Stimulation;Therapeutic activities;Therapeutic exercise;Balance training;Neuromuscular re-education;Patient/family education;Manual techniques;Dry needling;Passive range of motion;Joint Manipulations;Spinal  Manipulations    PT Next Visit Plan strengthening and muscular endurance exercise, neurodynamics, functional strengthening, manual as needed    PT Home Exercise Plan Medbridge Access Code: [email or web address]    Consulted and Agree with Plan of Care Patient           Patient will benefit from skilled therapeutic intervention in order to improve the following deficits and impairments:  Improper body mechanics,Pain,Postural dysfunction,Increased muscle spasms,Decreased coordination,Decreased mobility,Decreased activity tolerance,Decreased endurance,Decreased range of motion,Decreased strength,Hypomobility,Impaired perceived functional ability,Difficulty walking,Impaired flexibility  Visit Diagnosis: Bilateral low back pain with left-sided sciatica, unspecified chronicity  Difficulty in walking, not elsewhere classified     Problem List Patient Active Problem List   Diagnosis Date Noted  . Has immunity to COVID-19 virus 09/18/2020  . Headache 08/22/2020  . Dizziness 08/22/2020  . Stress 04/20/2020  . Decreased GFR 03/11/2020  . Leg pain 11/04/2019  . Osteopenia 11/04/2018  . Rectal bleeding 06/27/2018  . Joint pain 06/08/2016  . Sleeping difficulty 04/06/2015  . SOB (shortness of breath) 02/02/2015  . Health care maintenance 02/02/2015  . Paraesophageal hiatal hernia 01/21/2015  . Esophageal reflux 01/21/2015  . Hernia of abdominal cavity 10/22/2014  . Back pain 10/13/2014  . Hyperglycemia 10/08/2014  . Abdominal hernia 10/08/2014  . Breast cancer ([place]) 07/09/2014  . Knee pain, right 07/09/2014  . Obstructive sleep apnea 12/23/2013  . Chest pain 10/01/2013  . Hypertension 10/25/2012  . GERD (gastroesophageal reflux disease) 10/25/2012  . Hypercholesterolemia 10/25/2012  . Allergic rhinitis 10/25/2012    Everlean Alstrom. Graylon Good, PT, DPT 02/26/21, 10:59 AM  [facility] [address], [place], [address] Phone:  [phone]   Fax:  [phone]  Name: ARDA DAGGS MRN: [id number] Date of Birth: Jan 30, 1946

## 2021-03-03 ENCOUNTER — Encounter: Payer: Self-pay | Admitting: Physical Therapy

## 2021-03-03 ENCOUNTER — Ambulatory Visit: Payer: Medicare HMO | Admitting: Physical Therapy

## 2021-03-03 ENCOUNTER — Other Ambulatory Visit: Payer: Self-pay

## 2021-03-03 DIAGNOSIS — R262 Difficulty in walking, not elsewhere classified: Secondary | ICD-10-CM

## 2021-03-03 DIAGNOSIS — M5442 Lumbago with sciatica, left side: Secondary | ICD-10-CM | POA: Diagnosis not present

## 2021-03-03 NOTE — Therapy (Signed)
[facility] [address], [place], [address] Phone: [phone]   Fax:  [phone]  Physical Therapy Treatment  Patient Details  Name: Carla Ryan MRN: [id number] Date of Birth: [birth year] Referring Provider (PT): Einar Pheasant, MD   Encounter Date: 03/03/2021   PT End of Session - 03/03/21 1044    Visit Number 12    Number of Visits 24    Date for PT Re-Evaluation 03/25/21    Authorization Type [employer] reporting period from 02/23/2021    Authorization Time Period [employer] #[id number] 1/25-3/4 12 PT visits    Authorization - Visit Number 11    Authorization - Number of Visits 12    Progress Note Due on Visit 10    PT Start Time 1034    PT Stop Time 1112    PT Time Calculation (min) 38 min    Activity Tolerance Patient tolerated treatment well    Behavior During Therapy [facility] for tasks assessed/performed           Past Medical History:  Diagnosis Date  . Abdominal hernia   . Anxiety    situational stress and anxiety  . Cancer ([place]) 2015   breast   . Cataract cortical, senile, unspecified laterality   . Chest pain   . Exertional dyspnea   . GERD (gastroesophageal reflux disease)   . Hiatal hernia   . Hiatal hernia   . Hypercholesterolemia   . Hyperlipidemia   . Hypertension   . Osteoarthritis    scoliosis, degenerative disc dz, knee, carpal tunnel  . Sleep apnea     Past Surgical History:  Procedure Laterality Date  . ABDOMINAL HYSTERECTOMY  07/2000  . APPENDECTOMY  2000  . APPENDECTOMY    . BREAST BIOPSY Bilateral 2014  . BREAST SURGERY Right 2015   lumpectomy  . CARPAL TUNNEL RELEASE    . COLONOSCOPY  2011   [name], [name]: Normal exam  . COLONOSCOPY WITH PROPOFOL N/A 04/03/2020   Procedure: COLONOSCOPY WITH PROPOFOL;  Surgeon: Toledo, Benay Pike, MD;  Location: [facility] ENDOSCOPY;  Service: Gastroenterology;  Laterality: N/A;  . ESOPHAGOGASTRODUODENOSCOPY (EGD)  WITH PROPOFOL N/A 04/03/2020   Procedure: ESOPHAGOGASTRODUODENOSCOPY (EGD) WITH PROPOFOL;  Surgeon: Toledo, Benay Pike, MD;  Location: [facility] ENDOSCOPY;  Service: Gastroenterology;  Laterality: N/A;  . HERNIA REPAIR  02/10/15  . Laparoscopic Repair of Lateral Abdominal Wall Hernia  02/10/15  . MASTECTOMY    . UPPER GI ENDOSCOPY  05/10/2014   [name], [name] Bile gastritis, medium hiatal hernia. Incomplete visualization of the cardia and fundus.    There were no vitals filed for this visit.   Subjective Assessment - 03/03/21 1037    Subjective Patient reports she has 3-4/10 pain in her bilateral glutes and over B anterior knees upon arrival today. She woke up with a lot more pain than usual in her bliateral glutes but it has eased off now. She did sit on the floor cleaning out a linen closet yesterday and wonders if this contributed to inreased pain. Also had some increased pain over the right low back following last treatment session that lasted a couple of days. has been doing her HEP including squats and states she does not feel increased pain during her exercises but does seem to have increased pain later. She has been doing 1.5 sets of the squats.    Pertinent History Patient is a 75 y.o. female who presents to outpatient physical therapy with  a referral for medical diagnosis low back pain. This patient's chief complaints consist of episodic low back pain with most recent episode starting Nov/Dec 2021, radiation down left leg above knee, leading to the following functional deficits: difficulty with walking, bending, gardening, picking strawberries, walking, standing, cooking, walking up hill, etc.  Relevant past medical history and comorbidities include scoliosis,right upper back/shoulder region (referred to Dr. Sharlet Salina), breast cancer in  2015 (takes a hormome therapy, followed by cancer center), fatigue, osteopenia, OSA, GED, abdominal hernia (repair 2016), shortness of breath after eating,  hiatial hernia (supposed to see surgeon), dizziness (episodes in Aug 2021), HTN, see chart for additional history.  Patient denies hx of stroke, seizures, lung problem, major cardiac events, diabetes, unexplained weight loss, changes in bowel or bladder problems, new onset stumbling or dropping things.    Limitations Standing;Walking;Lifting;House hold activities    How long can you sit comfortably? doesn't bother her much    How long can you stand comfortably? 30-60 min in the kitchen but it hurts    How long can you walk comfortably? 10-15 min    Diagnostic tests no recent imaging  Lumbar/thoracic MRI report from 09/09/2014: "Impression:   1. Severe levoscoliosis of the thoracolumbar spine, centered at L1-L2. No   canal or foraminal stenosis in the thoracic spine. Multilevel degenerative   disc disease in the lumbar spine without advanced canal or foraminal   stenosis.   2. No pathologic marrow signal noted. "    Currently in Pain? Yes    Pain Score 4             TREATMENT: Therapeutic exercise:to centralize symptoms and improve ROM, strength, muscular endurance, and activity tolerance required for successfulcompletion of functional activities. - hooklying lower trunk rotation x20 with green theraball under legs. Cuing for abdominal control.  - hooklying double knees to chest with green theraball under feet x 20. Cuing for abdominal control. - straight leg bridge from ball x20, with 5 second hold, hands across chest.  - hooklying single leg bridge, 1x10 each side, hold 5 seconds.  - hooklying B hip abduction against green theraband, 3 second hold, x20 - hooklying bridge with hip abduction against green theraband, 1x10, 5 second hold.  - modified dead lift in front of 18 inch chair lowering extened hands holding 5#DB by the ends like a door knob to the seat and back. 1x10, 1x5 (disconitnued due to fatigue->pain at low back).  - Education on HEP including handout   Manual therapy: to  reduce pain and tissue tension, improve range of motion, neuromodulation, in order to promote improved ability to complete functional activities. PRONE - STM to B lumbar paraspinals and left glute and deep rotator region at the posterior hip (tender/tight) Checked R glutes and not tender or tight.  - CPA along lumbar segments ~ 30 seconds each with EK wedge grade II for pain relief.   Pt required multimodal cuing for proper technique and to facilitate improved neuromuscular control, strength, range of motion, and functional ability resulting in improved performance and form.  HOME EXERCISE PROGRAM Access Code: [email or web address] URL: [email or web address] Date: 03/03/2021 Prepared by: Rosita Kea  Exercises Supine Lower Trunk Rotation - 1 x daily - 1 sets - 20 reps Beginner Flat Bicycle - 1 x daily - 3 sets - 10 reps Single arm row with band/cable - 1 x daily - 2-3 sets - 10 reps Squat with Chair Touch - 3 x weekly - 2-3 sets - 10 reps Bridge with  Abduction and Resistance Loop - 1 x daily - 2 sets - 10 reps - 5 seconds hold    PT Education - 03/03/21 1044    Education Details Exercise purpose/form.    Person(s) Educated Patient    Methods Explanation;Demonstration;Tactile cues;Verbal cues    Comprehension Verbalized understanding;Returned demonstration;Verbal cues required;Tactile cues required;Need further instruction            PT Short Term Goals - 01/13/21 1046      PT SHORT TERM GOAL #1   Title Be independent with initial home exercise program for self-management of symptoms.    Baseline initial HEP provided at IE (12/31/2020);    Time 2    Period Weeks    Status Achieved    Target Date 01/15/21             PT Long Term Goals - 02/23/21 1437      PT LONG TERM GOAL #1   Title Be independent with a long-term home exercise program for self-management of symptoms.    Baseline initial HEP provided at IE (12/31/2020); patient currently participating well  (02/23/2021);    Time 12    Period Weeks    Status Partially Met   TARGET DATE FOR ALL LONG TERM GOALS: 03/25/2021     PT LONG TERM GOAL #2   Title Demonstrate improved FOTO score to equal or greater than 66 by visit #11 demonstrate improvement in overall condition and self-reported functional ability.    Baseline 56 (12/31/2020); 50 at visit 10 (02/23/2021);    Time 12    Period Weeks    Status On-going      PT LONG TERM GOAL #3   Title Patient will complete 1000 feet during 6 Minute Walk Test to demonstrate improved community mobility.    Baseline to be tested on visit 2 (01/02/2020); 1350 with pain (01/13/2021); 1554 feet with pain starting at only the last 3 laps (02/23/2021);    Time 12    Period Weeks    Status Achieved      PT LONG TERM GOAL #4   Title Reduce pain with functional activities to equal or less than 1/10 to allow patient to complete usual activities including ADLs, IADLs, and social engagement with less difficulty.    Baseline 8-9/10 (12/31/2020); up to 4/10 (02/23/2021);    Time 12    Period Weeks    Status Partially Met      PT LONG TERM GOAL #5   Title Complete community, work and/or recreational activities without limitation due to current condition.    Baseline Functional Limitations: bending, gardening, picking strawberries, walking, standing, cooking, walking up hill, etc (12/31/2020); improved standing while doing hair, improved bending and yardwork during activity but still very bothersome afterwards, continues to have some trouble walking up a hill (02/23/2021);    Time 12    Period Weeks    Status Partially Met                 Plan - 03/03/21 1124    Clinical Impression Statement Patient tolerated treatment well overall and reported improved pain at the lumbar region by the end of the session compared to arrival. Continues to be tender and tight in deep hip rotator/glute region on the left and may benefit from further manual therapy to this region. Continued  to work on progressions of core stability transitioning to standing. Patient has significant scoliosis that is worse in standing that is likely contributing to her decreased tolerance  with standing exercise. Educated on hip hinge and trunk stability during upright postures. Did not continue with schroth inspired exercises due to increased pain at right low back without obvious improvement in left sided pain. May re-visit in the future as appropriate. Patient would benefit from continued management of limiting condition by skilled physical therapist to address remaining impairments and functional limitations to work towards stated goals and return to PLOF or maximal functional independence.    Personal Factors and Comorbidities Age;Comorbidity 3+;Past/Current Experience;Fitness;Time since onset of injury/illness/exacerbation    Comorbidities Relevant past medical history and comorbidities include scoliosis,right upper back/shoulder region (referred to Dr. Sharlet Salina), breast cancer in  2015 (takes a hormome therapy, followed by cancer center), fatigue, osteopenia, OSA, GED, abdominal hernia (repair 2016), shortness of breath after eating, hiatial hernia (supposed to see surgeon), dizziness (episodes in Aug 2021), HTN, see chart for additional history.    Examination-Activity Limitations Caring for Others;Carry;Locomotion Level;Stand;Stairs;Lift;Squat;Sleep;Transfers    Examination-Participation Restrictions Laundry;Cleaning;Community Activity;Yard Work;Interpersonal Relationship   bending, gardening, picking strawberries, walking, standing, cooking, walking up hill, etc   Stability/Clinical Decision Making Stable/Uncomplicated    Rehab Potential Good    PT Frequency 2x / week    PT Duration 12 weeks    PT Treatment/Interventions ADLs/Self Care Home Management;Cryotherapy;Moist Heat;Electrical Stimulation;Therapeutic activities;Therapeutic exercise;Balance training;Neuromuscular re-education;Patient/family  education;Manual techniques;Dry needling;Passive range of motion;Joint Manipulations;Spinal Manipulations    PT Next Visit Plan strengthening and muscular endurance exercise, neurodynamics, functional strengthening, manual as needed    PT Home Exercise Plan Medbridge Access Code: [email or web address]    Consulted and Agree with Plan of Care Patient           Patient will benefit from skilled therapeutic intervention in order to improve the following deficits and impairments:  Improper body mechanics,Pain,Postural dysfunction,Increased muscle spasms,Decreased coordination,Decreased mobility,Decreased activity tolerance,Decreased endurance,Decreased range of motion,Decreased strength,Hypomobility,Impaired perceived functional ability,Difficulty walking,Impaired flexibility  Visit Diagnosis: Bilateral low back pain with left-sided sciatica, unspecified chronicity  Difficulty in walking, not elsewhere classified     Problem List Patient Active Problem List   Diagnosis Date Noted  . Has immunity to COVID-19 virus 09/18/2020  . Headache 08/22/2020  . Dizziness 08/22/2020  . Stress 04/20/2020  . Decreased GFR 03/11/2020  . Leg pain 11/04/2019  . Osteopenia 11/04/2018  . Rectal bleeding 06/27/2018  . Joint pain 06/08/2016  . Sleeping difficulty 04/06/2015  . SOB (shortness of breath) 02/02/2015  . Health care maintenance 02/02/2015  . Paraesophageal hiatal hernia 01/21/2015  . Esophageal reflux 01/21/2015  . Hernia of abdominal cavity 10/22/2014  . Back pain 10/13/2014  . Hyperglycemia 10/08/2014  . Abdominal hernia 10/08/2014  . Breast cancer ([place]) 07/09/2014  . Knee pain, right 07/09/2014  . Obstructive sleep apnea 12/23/2013  . Chest pain 10/01/2013  . Hypertension 10/25/2012  . GERD (gastroesophageal reflux disease) 10/25/2012  . Hypercholesterolemia 10/25/2012  . Allergic rhinitis 10/25/2012    Everlean Alstrom. Graylon Good, PT, DPT 03/03/21, 11:25 AM  [facility] [address], [place], [address] Phone: [phone]   Fax:  [phone]  Name: Carla Ryan MRN: [id number] Date of Birth: November 07, 1946

## 2021-03-05 ENCOUNTER — Encounter: Payer: BC Managed Care – PPO | Admitting: Physical Therapy

## 2021-03-09 ENCOUNTER — Ambulatory Visit: Payer: Medicare HMO | Admitting: Physical Therapy

## 2021-03-09 ENCOUNTER — Other Ambulatory Visit: Payer: Self-pay

## 2021-03-09 ENCOUNTER — Encounter: Payer: Self-pay | Admitting: Physical Therapy

## 2021-03-09 ENCOUNTER — Encounter: Payer: BC Managed Care – PPO | Admitting: Physical Therapy

## 2021-03-09 DIAGNOSIS — R262 Difficulty in walking, not elsewhere classified: Secondary | ICD-10-CM | POA: Diagnosis not present

## 2021-03-09 DIAGNOSIS — M5442 Lumbago with sciatica, left side: Secondary | ICD-10-CM | POA: Diagnosis not present

## 2021-03-09 DIAGNOSIS — M5030 Other cervical disc degeneration, unspecified cervical region: Secondary | ICD-10-CM | POA: Diagnosis not present

## 2021-03-09 DIAGNOSIS — M4802 Spinal stenosis, cervical region: Secondary | ICD-10-CM | POA: Diagnosis not present

## 2021-03-09 DIAGNOSIS — M5412 Radiculopathy, cervical region: Secondary | ICD-10-CM | POA: Diagnosis not present

## 2021-03-09 NOTE — Therapy (Signed)
[facility] [address], [place], [address] Phone: [phone]   Fax:  [phone]  Physical Therapy Treatment  Patient Details  Name: Carla Ryan MRN: [id number] Date of Birth: [birth year] Referring Provider (PT): Einar Pheasant, MD   Encounter Date: 03/09/2021   PT End of Session - 03/09/21 1527    Visit Number 13    Number of Visits 24    Date for PT Re-Evaluation 03/25/21    Authorization Type [employer] reporting period from 02/23/2021    Authorization Time Period [employer] auth 16 visits 02/24/21 - 04/17/21 Authorization #[id number]    Authorization - Visit Number 3    Authorization - Number of Visits 16    Progress Note Due on Visit 10    PT Start Time 1520    PT Stop Time 1558    PT Time Calculation (min) 38 min    Activity Tolerance Patient tolerated treatment well    Behavior During Therapy [facility] for tasks assessed/performed           Past Medical History:  Diagnosis Date  . Abdominal hernia   . Anxiety    situational stress and anxiety  . Cancer ([place]) 2015   breast   . Cataract cortical, senile, unspecified laterality   . Chest pain   . Exertional dyspnea   . GERD (gastroesophageal reflux disease)   . Hiatal hernia   . Hiatal hernia   . Hypercholesterolemia   . Hyperlipidemia   . Hypertension   . Osteoarthritis    scoliosis, degenerative disc dz, knee, carpal tunnel  . Sleep apnea     Past Surgical History:  Procedure Laterality Date  . ABDOMINAL HYSTERECTOMY  07/2000  . APPENDECTOMY  2000  . APPENDECTOMY    . BREAST BIOPSY Bilateral 2014  . BREAST SURGERY Right 2015   lumpectomy  . CARPAL TUNNEL RELEASE    . COLONOSCOPY  2011   [name], [name]: Normal exam  . COLONOSCOPY WITH PROPOFOL N/A 04/03/2020   Procedure: COLONOSCOPY WITH PROPOFOL;  Surgeon: Toledo, Benay Pike, MD;  Location: [facility] ENDOSCOPY;  Service: Gastroenterology;  Laterality: N/A;  .  ESOPHAGOGASTRODUODENOSCOPY (EGD) WITH PROPOFOL N/A 04/03/2020   Procedure: ESOPHAGOGASTRODUODENOSCOPY (EGD) WITH PROPOFOL;  Surgeon: Toledo, Benay Pike, MD;  Location: [facility] ENDOSCOPY;  Service: Gastroenterology;  Laterality: N/A;  . HERNIA REPAIR  02/10/15  . Laparoscopic Repair of Lateral Abdominal Wall Hernia  02/10/15  . MASTECTOMY    . UPPER GI ENDOSCOPY  05/10/2014   [name], [name] Bile gastritis, medium hiatal hernia. Incomplete visualization of the cardia and fundus.    There were no vitals filed for this visit.   Subjective Assessment - 03/09/21 1521    Subjective Patient reports she is continuing to have similar pain. She is having to have pain in her low back and into left knee. She is up to 15 squats but continues to have pain down the back of her left leg by mid afternoon. She feels like she is able to stand and dry her hair without needing to sit down (which she was unable to do without sititng before). She does not think it has gotten any worse after she started to do squats but has not continued to improve. Rates current pain 4-5/10. She saw Dr. Sharlet Salina today and is getting a lumbar injection tomorrow. Does not remember if she had increased pain after her last session.    Pertinent History Patient is a 75 y.o.  female who presents to outpatient physical therapy with a referral for medical diagnosis low back pain. This patient's chief complaints consist of episodic low back pain with most recent episode starting Nov/Dec 2021, radiation down left leg above knee, leading to the following functional deficits: difficulty with walking, bending, gardening, picking strawberries, walking, standing, cooking, walking up hill, etc.  Relevant past medical history and comorbidities include scoliosis,right upper back/shoulder region (referred to Dr. Sharlet Salina), breast cancer in  2015 (takes a hormome therapy, followed by cancer center), fatigue, osteopenia, OSA, GED, abdominal hernia (repair 2016),  shortness of breath after eating, hiatial hernia (supposed to see surgeon), dizziness (episodes in Aug 2021), HTN, see chart for additional history.  Patient denies hx of stroke, seizures, lung problem, major cardiac events, diabetes, unexplained weight loss, changes in bowel or bladder problems, new onset stumbling or dropping things.    Limitations Standing;Walking;Lifting;House hold activities    How long can you sit comfortably? doesn't bother her much    How long can you stand comfortably? 30-60 min in the kitchen but it hurts    How long can you walk comfortably? 10-15 min    Diagnostic tests no recent imaging  Lumbar/thoracic MRI report from 09/09/2014: "Impression:   1. Severe levoscoliosis of the thoracolumbar spine, centered at L1-L2. No   canal or foraminal stenosis in the thoracic spine. Multilevel degenerative   disc disease in the lumbar spine without advanced canal or foraminal   stenosis.   2. No pathologic marrow signal noted. "    Currently in Pain? Yes    Pain Score 5     Pain Location Back    Pain Orientation Left    Pain Radiating Towards L LE glute to knee            TREATMENT: Therapeutic exercise:to centralize symptoms and improve ROM, strength, muscular endurance, and activity tolerance required for successfulcompletion of functional activities. - hooklying bridge with hip abduction against green theraband, 1x20, 5 second hold.  - hooklying single leg bridge, 1x10 each side, hold 5 seconds.  - modified dead lift in front of 18 inch chair lowering extened hands holding 5#DB by the ends like a door knob to the seat and back.  3x5 (practiced breathing to decrease IAP).  - standing single arm row 1x20 each side with 10# cable.  - standing hip abduction/extension diagonal with yellow theraband. 3x10 each side. Cuing for posture.   - Education on HEP including handout   Manual therapy: to reduce pain and tissue tension, improve range of motion, neuromodulation, in  order to promote improved ability to complete functional activities. PRONE - STM to B lumbar paraspinals and left glute and deep rotator region at the posterior hip (tender/tight) - CPA along lumbar segments ~ 30 seconds each with EK wedge grade II for pain relief.   Pt required multimodal cuing for proper technique and to facilitate improved neuromuscular control, strength, range of motion, and functional ability resulting in improved performance and form.  HOME EXERCISE PROGRAM Access Code: [email or web address] URL: [email or web address] Date: 03/03/2021 Prepared by: Rosita Kea  Exercises Supine Lower Trunk Rotation - 1 x daily - 1 sets - 20 reps Beginner Flat Bicycle - 1 x daily - 3 sets - 10 reps Single arm row with band/cable - 1 x daily - 2-3 sets - 10 reps Squat with Chair Touch - 3 x weekly - 2-3 sets - 10 reps Bridge with Abduction and Resistance Loop - 1 x daily - 2 sets -  10 reps - 5 seconds hold     PT Education - 03/09/21 1527    Education Details Exercise purpose/form.    Person(s) Educated Patient    Methods Explanation;Demonstration;Tactile cues;Verbal cues    Comprehension Verbalized understanding;Returned demonstration;Verbal cues required;Tactile cues required;Need further instruction            PT Short Term Goals - 01/13/21 1046      PT SHORT TERM GOAL #1   Title Be independent with initial home exercise program for self-management of symptoms.    Baseline initial HEP provided at IE (12/31/2020);    Time 2    Period Weeks    Status Achieved    Target Date 01/15/21             PT Long Term Goals - 02/23/21 1437      PT LONG TERM GOAL #1   Title Be independent with a long-term home exercise program for self-management of symptoms.    Baseline initial HEP provided at IE (12/31/2020); patient currently participating well (02/23/2021);    Time 12    Period Weeks    Status Partially Met   TARGET DATE FOR ALL LONG TERM GOALS: 03/25/2021     PT LONG  TERM GOAL #2   Title Demonstrate improved FOTO score to equal or greater than 66 by visit #11 demonstrate improvement in overall condition and self-reported functional ability.    Baseline 56 (12/31/2020); 50 at visit 10 (02/23/2021);    Time 12    Period Weeks    Status On-going      PT LONG TERM GOAL #3   Title Patient will complete 1000 feet during 6 Minute Walk Test to demonstrate improved community mobility.    Baseline to be tested on visit 2 (01/02/2020); 1350 with pain (01/13/2021); 1554 feet with pain starting at only the last 3 laps (02/23/2021);    Time 12    Period Weeks    Status Achieved      PT LONG TERM GOAL #4   Title Reduce pain with functional activities to equal or less than 1/10 to allow patient to complete usual activities including ADLs, IADLs, and social engagement with less difficulty.    Baseline 8-9/10 (12/31/2020); up to 4/10 (02/23/2021);    Time 12    Period Weeks    Status Partially Met      PT LONG TERM GOAL #5   Title Complete community, work and/or recreational activities without limitation due to current condition.    Baseline Functional Limitations: bending, gardening, picking strawberries, walking, standing, cooking, walking up hill, etc (12/31/2020); improved standing while doing hair, improved bending and yardwork during activity but still very bothersome afterwards, continues to have some trouble walking up a hill (02/23/2021);    Time 12    Period Weeks    Status Partially Met                 Plan - 03/09/21 1551    Clinical Impression Statement Patient tolerated treatment well overall with no increase in pain by end of session. Increased standing exercise selection as tolerated. Continues to have difficulty with left radicular symptoms, worse with standing. Discussed ways to decrease intra-abdominal pressure during exercises Patient would benefit from continued management of limiting condition by skilled physical therapist to address remaining  impairments and functional limitations to work towards stated goals and return to PLOF or maximal functional independence.    Personal Factors and Comorbidities Age;Comorbidity 3+;Past/Current Experience;Fitness;Time since onset of  injury/illness/exacerbation    Comorbidities Relevant past medical history and comorbidities include scoliosis,right upper back/shoulder region (referred to Dr. Sharlet Salina), breast cancer in  2015 (takes a hormome therapy, followed by cancer center), fatigue, osteopenia, OSA, GED, abdominal hernia (repair 2016), shortness of breath after eating, hiatial hernia (supposed to see surgeon), dizziness (episodes in Aug 2021), HTN, see chart for additional history.    Examination-Activity Limitations Caring for Others;Carry;Locomotion Level;Stand;Stairs;Lift;Squat;Sleep;Transfers    Examination-Participation Restrictions Laundry;Cleaning;Community Activity;Yard Work;Interpersonal Relationship   bending, gardening, picking strawberries, walking, standing, cooking, walking up hill, etc   Stability/Clinical Decision Making Stable/Uncomplicated    Rehab Potential Good    PT Frequency 2x / week    PT Duration 12 weeks    PT Treatment/Interventions ADLs/Self Care Home Management;Cryotherapy;Moist Heat;Electrical Stimulation;Therapeutic activities;Therapeutic exercise;Balance training;Neuromuscular re-education;Patient/family education;Manual techniques;Dry needling;Passive range of motion;Joint Manipulations;Spinal Manipulations    PT Next Visit Plan strengthening and muscular endurance exercise, neurodynamics, functional strengthening, manual as needed    PT Home Exercise Plan Medbridge Access Code: [email or web address]    Consulted and Agree with Plan of Care Patient           Patient will benefit from skilled therapeutic intervention in order to improve the following deficits and impairments:  Improper body mechanics,Pain,Postural dysfunction,Increased muscle spasms,Decreased  coordination,Decreased mobility,Decreased activity tolerance,Decreased endurance,Decreased range of motion,Decreased strength,Hypomobility,Impaired perceived functional ability,Difficulty walking,Impaired flexibility  Visit Diagnosis: Bilateral low back pain with left-sided sciatica, unspecified chronicity  Difficulty in walking, not elsewhere classified     Problem List Patient Active Problem List   Diagnosis Date Noted  . Has immunity to COVID-19 virus 09/18/2020  . Headache 08/22/2020  . Dizziness 08/22/2020  . Stress 04/20/2020  . Decreased GFR 03/11/2020  . Leg pain 11/04/2019  . Osteopenia 11/04/2018  . Rectal bleeding 06/27/2018  . Joint pain 06/08/2016  . Sleeping difficulty 04/06/2015  . SOB (shortness of breath) 02/02/2015  . Health care maintenance 02/02/2015  . Paraesophageal hiatal hernia 01/21/2015  . Esophageal reflux 01/21/2015  . Hernia of abdominal cavity 10/22/2014  . Back pain 10/13/2014  . Hyperglycemia 10/08/2014  . Abdominal hernia 10/08/2014  . Breast cancer ([place]) 07/09/2014  . Knee pain, right 07/09/2014  . Obstructive sleep apnea 12/23/2013  . Chest pain 10/01/2013  . Hypertension 10/25/2012  . GERD (gastroesophageal reflux disease) 10/25/2012  . Hypercholesterolemia 10/25/2012  . Allergic rhinitis 10/25/2012   Everlean Alstrom. Graylon Good, PT, DPT 03/10/21, 3:05 PM  [facility] [address] [place], [address] Phone: [phone]   Fax:  [phone]  Name: Carla Ryan MRN: [id number] Date of Birth: 07/29/1946

## 2021-03-10 DIAGNOSIS — M5412 Radiculopathy, cervical region: Secondary | ICD-10-CM | POA: Diagnosis not present

## 2021-03-10 DIAGNOSIS — M5030 Other cervical disc degeneration, unspecified cervical region: Secondary | ICD-10-CM | POA: Diagnosis not present

## 2021-03-10 DIAGNOSIS — M4802 Spinal stenosis, cervical region: Secondary | ICD-10-CM | POA: Diagnosis not present

## 2021-03-11 ENCOUNTER — Ambulatory Visit: Payer: Medicare HMO | Admitting: Physical Therapy

## 2021-03-17 ENCOUNTER — Ambulatory Visit: Payer: Medicare HMO | Admitting: Physical Therapy

## 2021-03-17 ENCOUNTER — Encounter: Payer: Self-pay | Admitting: Physical Therapy

## 2021-03-17 ENCOUNTER — Other Ambulatory Visit: Payer: Self-pay

## 2021-03-17 DIAGNOSIS — M5442 Lumbago with sciatica, left side: Secondary | ICD-10-CM | POA: Diagnosis not present

## 2021-03-17 DIAGNOSIS — R262 Difficulty in walking, not elsewhere classified: Secondary | ICD-10-CM

## 2021-03-17 DIAGNOSIS — E7800 Pure hypercholesterolemia, unspecified: Secondary | ICD-10-CM | POA: Diagnosis not present

## 2021-03-17 DIAGNOSIS — I351 Nonrheumatic aortic (valve) insufficiency: Secondary | ICD-10-CM | POA: Diagnosis not present

## 2021-03-17 DIAGNOSIS — I10 Essential (primary) hypertension: Secondary | ICD-10-CM | POA: Diagnosis not present

## 2021-03-17 NOTE — Therapy (Signed)
[facility] [address], [place], [address] Phone: [phone]   Fax:  [phone]  Physical Therapy Treatment  Patient Details  Name: Carla Ryan MRN: [id number] Date of Birth: [birth year] Referring Provider (PT): Einar Pheasant, MD   Encounter Date: 03/17/2021   PT End of Session - 03/17/21 1706    Visit Number 14    Number of Visits 24    Date for PT Re-Evaluation 03/25/21    Authorization Type [employer] reporting period from 02/23/2021    Authorization Time Period [employer] auth 16 visits 02/24/21 - 04/17/21 Authorization #[id number]    Authorization - Visit Number 4    Authorization - Number of Visits 16    Progress Note Due on Visit 10    PT Start Time 1607    PT Stop Time 1647    PT Time Calculation (min) 40 min    Activity Tolerance Patient tolerated treatment well    Behavior During Therapy [facility] for tasks assessed/performed           Past Medical History:  Diagnosis Date  . Abdominal hernia   . Anxiety    situational stress and anxiety  . Cancer ([place]) 2015   breast   . Cataract cortical, senile, unspecified laterality   . Chest pain   . Exertional dyspnea   . GERD (gastroesophageal reflux disease)   . Hiatal hernia   . Hiatal hernia   . Hypercholesterolemia   . Hyperlipidemia   . Hypertension   . Osteoarthritis    scoliosis, degenerative disc dz, knee, carpal tunnel  . Sleep apnea     Past Surgical History:  Procedure Laterality Date  . ABDOMINAL HYSTERECTOMY  07/2000  . APPENDECTOMY  2000  . APPENDECTOMY    . BREAST BIOPSY Bilateral 2014  . BREAST SURGERY Right 2015   lumpectomy  . CARPAL TUNNEL RELEASE    . COLONOSCOPY  2011   [name], [name]: Normal exam  . COLONOSCOPY WITH PROPOFOL N/A 04/03/2020   Procedure: COLONOSCOPY WITH PROPOFOL;  Surgeon: Toledo, Benay Pike, MD;  Location: [facility] ENDOSCOPY;  Service: Gastroenterology;  Laterality: N/A;  .  ESOPHAGOGASTRODUODENOSCOPY (EGD) WITH PROPOFOL N/A 04/03/2020   Procedure: ESOPHAGOGASTRODUODENOSCOPY (EGD) WITH PROPOFOL;  Surgeon: Toledo, Benay Pike, MD;  Location: [facility] ENDOSCOPY;  Service: Gastroenterology;  Laterality: N/A;  . HERNIA REPAIR  02/10/15  . Laparoscopic Repair of Lateral Abdominal Wall Hernia  02/10/15  . MASTECTOMY    . UPPER GI ENDOSCOPY  05/10/2014   [name], [name] Bile gastritis, medium hiatal hernia. Incomplete visualization of the cardia and fundus.    There were no vitals filed for this visit.   Subjective Assessment - 03/17/21 1609    Subjective Patient reports she was told by Dr. Sharlet Salina (for her neck) to discontinue PT (nothing strenuous) until yesterday after he had a steroid injection in her neck. She feels like it did not help very much yet but she is still hopeful. She did have trouble sleeping directly after the shot. She feels like the pain around her shoulder blade is better but she feels more pain in her R UT and neck. She has not done anything for her back for about a week until yesterday and she started walking and it made her back sore. Exercises for low back felt okay. She does not feel sore today. She did not do the bridge with the band because back in November she was sititng for 2-3 hours and  when she got up to go to the restroom she got a suddend sharp pain in her R adductor region. It occasionally does this and started doing this when she was doing bridges, so she did not do that. She also is coughing more due to eating late by accident and feels her hiatial hernia is acting up. Will see a surgon in [facility] April 12th for her hiatial hernia. At the end of the session, patient reports she would like to take a break from PT and continue exercises at home independently for a while. Will contact clinic if she wants to return soon. She will be very busy this next month and with possible surgery for hiatial hernia.    Pertinent History Patient is a 75 y.o.  female who presents to outpatient physical therapy with a referral for medical diagnosis low back pain. This patient's chief complaints consist of episodic low back pain with most recent episode starting Nov/Dec 2021, radiation down left leg above knee, leading to the following functional deficits: difficulty with walking, bending, gardening, picking strawberries, walking, standing, cooking, walking up hill, etc.  Relevant past medical history and comorbidities include scoliosis,right upper back/shoulder region (referred to Dr. Sharlet Salina), breast cancer in  2015 (takes a hormome therapy, followed by cancer center), fatigue, osteopenia, OSA, GED, abdominal hernia (repair 2016), shortness of breath after eating, hiatial hernia (supposed to see surgeon), dizziness (episodes in Aug 2021), HTN, see chart for additional history.  Patient denies hx of stroke, seizures, lung problem, major cardiac events, diabetes, unexplained weight loss, changes in bowel or bladder problems, new onset stumbling or dropping things.    Limitations Standing;Walking;Lifting;House hold activities    How long can you sit comfortably? doesn't bother her much    How long can you stand comfortably? 30-60 min in the kitchen but it hurts    How long can you walk comfortably? 10-15 min    Diagnostic tests no recent imaging  Lumbar/thoracic MRI report from 09/09/2014: "Impression:   1. Severe levoscoliosis of the thoracolumbar spine, centered at L1-L2. No   canal or foraminal stenosis in the thoracic spine. Multilevel degenerative   disc disease in the lumbar spine without advanced canal or foraminal   stenosis.   2. No pathologic marrow signal noted. "    Currently in Pain? Yes    Pain Score 2     Pain Location --   R scapula and neck; not low back          OBJECTIVE FOTO = 52  TREATMENT: Therapeutic exercise:to centralize symptoms and improve ROM, strength, muscular endurance, and activity tolerance required for  successfulcompletion of functional activities. - quat with buttocks taps on green chair (18 inches), with isometric  abduction against green theraband, 2x10 - seated marching with abdominal brace on translucent theraball, 2x10 - standing hip abduction/extension diagonal with yellow theraband. 2x15 each side. Cuing for posture. - modified dead lift in front of 18 inch chair lowering extened hands holding 5#DB by the ends like a door knob to the seat and back.  3x5 (practiced breathing to decrease IAP).  - seated marching at edge of table, 1x5 each side.  - standing hip hinge against resistance with green theraband around front of pelvis and B UE support with extended elbows, 2x10 -Education on HEP including handout  Manual therapy:to reduce pain and tissue tension, improve range of motion, neuromodulation, in order to promote improved ability to complete functional activities. RECLINED - STM to R hip adductors and hamstrings (  no concordant pain or abnormal tightness).   Pt required multimodal cuing for proper technique and to facilitate improved neuromuscular control, strength, range of motion, and functional ability resulting in improved performance and form.  HOME EXERCISE PROGRAM Access Code: [email or web address] URL: [email or web address] Date: 03/17/2021 Prepared by: Rosita Kea  Exercises Supine Lower Trunk Rotation - 1 x daily - 1 sets - 20 reps Beginner Flat Bicycle - 1 x daily - 3 sets - 10 reps Single arm row with band/cable - 1 x daily - 2-3 sets - 10 reps Squat with Chair Touch and Resistance Loop - 1 x daily - 2-3 sets - 10 reps Bridge with Abduction and Resistance Loop - 1 x daily - 2 sets - 10 reps - 5 seconds hold Diagonal Hip Extension with Resistance - 1 x daily - 2 sets - 15 reps - 2 second hold     PT Education - 03/17/21 1717    Education Details Exercise purpose/form. self management techniques, POC. possible discharge reccomendations    Person(s)  Educated Patient    Methods Explanation;Demonstration;Tactile cues;Verbal cues;Handout    Comprehension Verbalized understanding;Returned demonstration;Verbal cues required;Tactile cues required;Need further instruction            PT Short Term Goals - 01/13/21 1046      PT SHORT TERM GOAL #1   Title Be independent with initial home exercise program for self-management of symptoms.    Baseline initial HEP provided at IE (12/31/2020);    Time 2    Period Weeks    Status Achieved    Target Date 01/15/21             PT Long Term Goals - 02/23/21 1437      PT LONG TERM GOAL #1   Title Be independent with a long-term home exercise program for self-management of symptoms.    Baseline initial HEP provided at IE (12/31/2020); patient currently participating well (02/23/2021);    Time 12    Period Weeks    Status Partially Met   TARGET DATE FOR ALL LONG TERM GOALS: 03/25/2021     PT LONG TERM GOAL #2   Title Demonstrate improved FOTO score to equal or greater than 66 by visit #11 demonstrate improvement in overall condition and self-reported functional ability.    Baseline 56 (12/31/2020); 50 at visit 10 (02/23/2021);    Time 12    Period Weeks    Status On-going      PT LONG TERM GOAL #3   Title Patient will complete 1000 feet during 6 Minute Walk Test to demonstrate improved community mobility.    Baseline to be tested on visit 2 (01/02/2020); 1350 with pain (01/13/2021); 1554 feet with pain starting at only the last 3 laps (02/23/2021);    Time 12    Period Weeks    Status Achieved      PT LONG TERM GOAL #4   Title Reduce pain with functional activities to equal or less than 1/10 to allow patient to complete usual activities including ADLs, IADLs, and social engagement with less difficulty.    Baseline 8-9/10 (12/31/2020); up to 4/10 (02/23/2021);    Time 12    Period Weeks    Status Partially Met      PT LONG TERM GOAL #5   Title Complete community, work and/or recreational  activities without limitation due to current condition.    Baseline Functional Limitations: bending, gardening, picking strawberries, walking, standing, cooking, walking up hill, etc (12/31/2020); improved standing while  doing hair, improved bending and yardwork during activity but still very bothersome afterwards, continues to have some trouble walking up a hill (02/23/2021);    Time 12    Period Weeks    Status Partially Met                 Plan - 03/17/21 1724    Clinical Impression Statement Patient tolerated treatment well today overall with mild discomfort at the right scapular region. Session initially focused on continued core and LE strength to improve functional activity tolerance without exacerbating UE/neck or hiatal hernia symptoms but concluded with discussion of plan of care and updated HEP for longer term use while patient takes a break from PT due to being very busy this coming months. Does not show progress on FOTO questionnaire at this time but also was unable to complete her HEP all of last week due to restrictions from Dr. Sharlet Salina who did injections to her neck. She reported increased difficulty with walking yesterday when she returned to that. Still has potential to improve functional activity tolerance for standing activities but PT will be put on hold at this time per patient preference. Plan to continue or discharge in about 4 weeks pending patient needs/preference. Patient would benefit from continued management of limiting condition by skilled physical therapist to address remaining impairments and functional limitations to work towards stated goals and return to PLOF or maximal functional independence.    Personal Factors and Comorbidities Age;Comorbidity 3+;Past/Current Experience;Fitness;Time since onset of injury/illness/exacerbation    Comorbidities Relevant past medical history and comorbidities include scoliosis,right upper back/shoulder region (referred to Dr.  Sharlet Salina), breast cancer in  2015 (takes a hormome therapy, followed by cancer center), fatigue, osteopenia, OSA, GED, abdominal hernia (repair 2016), shortness of breath after eating, hiatial hernia (supposed to see surgeon), dizziness (episodes in Aug 2021), HTN, see chart for additional history.    Examination-Activity Limitations Caring for Others;Carry;Locomotion Level;Stand;Stairs;Lift;Squat;Sleep;Transfers    Examination-Participation Restrictions Laundry;Cleaning;Community Activity;Yard Work;Interpersonal Relationship   bending, gardening, picking strawberries, walking, standing, cooking, walking up hill, etc   Stability/Clinical Decision Making Stable/Uncomplicated    Rehab Potential Good    PT Frequency 2x / week    PT Duration 12 weeks    PT Treatment/Interventions ADLs/Self Care Home Management;Cryotherapy;Moist Heat;Electrical Stimulation;Therapeutic activities;Therapeutic exercise;Balance training;Neuromuscular re-education;Patient/family education;Manual techniques;Dry needling;Passive range of motion;Joint Manipulations;Spinal Manipulations    PT Next Visit Plan Hold PT at this time per patinet preference, discharge or continue in 4 weeks    PT Home Exercise Plan Medbridge Access Code: [email or web address]    Consulted and Agree with Plan of Care Patient           Patient will benefit from skilled therapeutic intervention in order to improve the following deficits and impairments:  Improper body mechanics,Pain,Postural dysfunction,Increased muscle spasms,Decreased coordination,Decreased mobility,Decreased activity tolerance,Decreased endurance,Decreased range of motion,Decreased strength,Hypomobility,Impaired perceived functional ability,Difficulty walking,Impaired flexibility  Visit Diagnosis: Bilateral low back pain with left-sided sciatica, unspecified chronicity  Difficulty in walking, not elsewhere classified     Problem List Patient Active Problem List   Diagnosis Date Noted  .  Has immunity to COVID-19 virus 09/18/2020  . Headache 08/22/2020  . Dizziness 08/22/2020  . Stress 04/20/2020  . Decreased GFR 03/11/2020  . Leg pain 11/04/2019  . Osteopenia 11/04/2018  . Rectal bleeding 06/27/2018  . Joint pain 06/08/2016  . Sleeping difficulty 04/06/2015  . SOB (shortness of breath) 02/02/2015  . Health care maintenance 02/02/2015  . Paraesophageal hiatal hernia 01/21/2015  . Esophageal reflux  01/21/2015  . Hernia of abdominal cavity 10/22/2014  . Back pain 10/13/2014  . Hyperglycemia 10/08/2014  . Abdominal hernia 10/08/2014  . Breast cancer ([place]) 07/09/2014  . Knee pain, right 07/09/2014  . Obstructive sleep apnea 12/23/2013  . Chest pain 10/01/2013  . Hypertension 10/25/2012  . GERD (gastroesophageal reflux disease) 10/25/2012  . Hypercholesterolemia 10/25/2012  . Allergic rhinitis 10/25/2012   Everlean Alstrom. Graylon Good, PT, DPT 03/17/21, 5:25 PM  [facility] [address] [place], [address] Phone: [phone]   Fax:  [phone]  Name: Carla Ryan MRN: [id number] Date of Birth: [birth year]

## 2021-03-20 ENCOUNTER — Other Ambulatory Visit: Payer: Self-pay | Admitting: Internal Medicine

## 2021-03-29 DIAGNOSIS — G4733 Obstructive sleep apnea (adult) (pediatric): Secondary | ICD-10-CM | POA: Diagnosis not present

## 2021-04-08 ENCOUNTER — Telehealth (INDEPENDENT_AMBULATORY_CARE_PROVIDER_SITE_OTHER): Payer: Medicare HMO | Admitting: Internal Medicine

## 2021-04-08 DIAGNOSIS — R739 Hyperglycemia, unspecified: Secondary | ICD-10-CM

## 2021-04-08 DIAGNOSIS — K219 Gastro-esophageal reflux disease without esophagitis: Secondary | ICD-10-CM | POA: Diagnosis not present

## 2021-04-08 DIAGNOSIS — I10 Essential (primary) hypertension: Secondary | ICD-10-CM

## 2021-04-08 DIAGNOSIS — E7800 Pure hypercholesterolemia, unspecified: Secondary | ICD-10-CM | POA: Diagnosis not present

## 2021-04-08 DIAGNOSIS — C50919 Malignant neoplasm of unspecified site of unspecified female breast: Secondary | ICD-10-CM | POA: Diagnosis not present

## 2021-04-08 DIAGNOSIS — G4733 Obstructive sleep apnea (adult) (pediatric): Secondary | ICD-10-CM | POA: Diagnosis not present

## 2021-04-08 MED ORDER — ROSUVASTATIN CALCIUM 5 MG PO TABS: 5.0000 mg | ORAL_TABLET | ORAL | 3 refills | Status: DC

## 2021-04-08 NOTE — Progress Notes (Signed)
Patient ID: Carla Ryan, female   DOB: 02-28-1946, 75 y.o.   MRN: [id number]   Virtual Visit via video Note  This visit type was conducted due to national recommendations for restrictions regarding the COVID-19 pandemic (e.g. social distancing).  This format is felt to be most appropriate for this patient at this time.  All issues noted in this document were discussed and addressed.  No physical exam was performed (except for noted visual exam findings with Video Visits).   I connected with Carla Ryan by a video enabled telemedicine application and verified that I am speaking with the correct person using two identifiers. Location patient: home Location provider:  home office Persons participating in the virtual visit: patient, provider  The limitations, risks, security and privacy concerns of performing an evaluation and management service by video and the availability of in person appointments have been discussed.  It has also been discussed with the patient that there may be a patient responsible charge related to this service. The patient expressed understanding and agreed to proceed.   Reason for visit: scheduled follow up.   HPI: Has a history of hypertension, hypercholesterolemia and a large hiatal hernia.  Saw cardiology recently for f/u.  Has mild postprandial exertional sob.  Previous stress echo revealed normal LV function without evidence of ischemia.  No changes made.  Recommended f/u in 6 months.  She recently went to PT for her back.  Did strengthen her core, but did not feel this helped her back pain.  She did see Dr Carla Ryan for her neck.  S/p injection.  Helped.  No chest pain.  Does report occasional acid reflux and occasional nausea with emesis.  May occur 1-2x/month.  Request to see GI MD at [facility] that specializes in hiatal hernia.  Reports blood pressures averaging 120s/60s.  Bowels moving.     ROS: See pertinent positives and negatives per HPI.  Past Medical History:   Diagnosis Date  . Abdominal hernia   . Anxiety    situational stress and anxiety  . Cancer ([facility]) 2015   breast   . Cataract cortical, senile, unspecified laterality   . Chest pain   . Exertional dyspnea   . GERD (gastroesophageal reflux disease)   . Hiatal hernia   . Hiatal hernia   . Hypercholesterolemia   . Hyperlipidemia   . Hypertension   . Osteoarthritis    scoliosis, degenerative disc dz, knee, carpal tunnel  . Sleep apnea     Past Surgical History:  Procedure Laterality Date  . ABDOMINAL HYSTERECTOMY  07/2000  . APPENDECTOMY  2000  . APPENDECTOMY    . BREAST BIOPSY Bilateral 2014  . BREAST SURGERY Right 2015   lumpectomy  . CARPAL TUNNEL RELEASE    . COLONOSCOPY  2011   Carla Ryan, [name]: Normal exam  . COLONOSCOPY WITH PROPOFOL N/A 04/03/2020   Procedure: COLONOSCOPY WITH PROPOFOL;  Surgeon: Toledo, Benay Pike, MD;  Location: [facility] ENDOSCOPY;  Service: Gastroenterology;  Laterality: N/A;  . ESOPHAGOGASTRODUODENOSCOPY (EGD) WITH PROPOFOL N/A 04/03/2020   Procedure: ESOPHAGOGASTRODUODENOSCOPY (EGD) WITH PROPOFOL;  Surgeon: Toledo, Benay Pike, MD;  Location: [facility]COPY;  Service: Gastroenterology;  Laterality: N/A;  . HERNIA REPAIR  02/10/15  . Laparoscopic Repair of Lateral Abdominal Wall Hernia  02/10/15  . MASTECTOMY    . UPPER GI ENDOSCOPY  05/10/2014   Carla Ryan, [name] Bile gastritis, medium hiatal hernia. Incomplete visualization of the cardia and fundus.    Family History  Problem Relation Age of  Onset  . Diabetes Mother   . Multiple myeloma Mother   . Diabetes Brother   . Diabetes Sister        x2  . Hypertension Brother   . Multiple myeloma Father     SOCIAL HX: reviewed.    Current Outpatient Medications:  .  anastrozole (ARIMIDEX) 1 MG tablet, TAKE 1 TABLET BY MOUTH ONCE DAILY, Disp: , Rfl:  .  aspirin 81 MG tablet, Take 81 mg by mouth daily., Disp: , Rfl:  .  azelastine (ASTELIN) 0.1 % nasal spray, USE 2 SPRAYS IN BOTH  NOSTRILS TWICE  DAILY AS  DIRECTED, Disp: 120 mL, Rfl: 3 .  calcium citrate-vitamin D 500-400 MG-UNIT chewable tablet, Chew 1 tablet by mouth 2 (two) times daily., Disp: , Rfl:  .  cholecalciferol (VITAMIN D) 1000 UNITS tablet, Take 2,000 Units by mouth in the morning and at bedtime. , Disp: , Rfl:  .  fluticasone (FLONASE) 50 MCG/ACT nasal spray, PLACE 2 SPRAYS INTO BOTH NOSTRILS DAILY., Disp: 48 g, Rfl: 1 .  gabapentin (NEURONTIN) 100 MG capsule, TAKE 2 CAPSULES AT BEDTIME, Disp: 180 capsule, Rfl: 2 .  irbesartan (AVAPRO) 75 MG tablet, TAKE 1 TABLET EVERY DAY, Disp: 90 tablet, Rfl: 3 .  levocetirizine (XYZAL) 5 MG tablet, Take 1 tablet (5 mg total) by mouth every evening., Disp: 90 tablet, Rfl: 3 .  methocarbamol (ROBAXIN) 500 MG tablet, Take 1 tablet (500 mg total) by mouth at bedtime as needed for muscle spasms., Disp: 30 tablet, Rfl: 0 .  Misc Natural Products (TART CHERRY ADVANCED) CAPS, Take 1 capsule by mouth 2 (two) times daily. Reported on 03/04/2016, Disp: , Rfl:  .  MULTIPLE MINERALS-VITAMINS PO, , Disp: , Rfl:  .  Multiple Vitamin (MULTIVITAMIN) tablet, Take 1 tablet by mouth daily., Disp: , Rfl:  .  nystatin cream (MYCOSTATIN), Apply 1 application topically 2 (two) times daily., Disp: 30 g, Rfl: 0 .  PEPPERMINT OIL PO, Take by mouth., Disp: , Rfl:  .  RABEprazole (ACIPHEX) 20 MG tablet, Take 1 tablet (20 mg total) by mouth 2 (two) times daily., Disp: 180 tablet, Rfl: 1 .  rosuvastatin (CRESTOR) 5 MG tablet, Take 1 tablet (5 mg total) by mouth 4 (four) times a week., Disp: 52 tablet, Rfl: 3 .  sucralfate (CARAFATE) 1 g tablet, Take 1 tablet (1 g total) by mouth 2 (two) times daily. (Patient taking differently: Take 1 g by mouth in the morning, at noon, and at bedtime.), Disp: 180 tablet, Rfl: 1 .  triamterene-hydrochlorothiazide (MAXZIDE-25) 37.5-25 MG tablet, TAKE 1/2 TABLET EVERY DAY, Disp: 45 tablet, Rfl: 1  EXAM:  VITALS per patient if applicable: 408X/44Y  GENERAL: alert, oriented, appears  well and in no acute distress  HEENT: atraumatic, conjunttiva clear, no obvious abnormalities on inspection of external nose and ears  NECK: normal movements of the head and neck  LUNGS: on inspection no signs of respiratory distress, breathing rate appears normal, no obvious gross SOB, gasping or wheezing  CV: no obvious cyanosis  PSYCH/NEURO: pleasant and cooperative, no obvious depression or anxiety, speech and thought processing grossly intact  ASSESSMENT AND PLAN:  Discussed the following assessment and plan:  Problem List Items Addressed This Visit    Breast cancer ([place])    On arimidex.  Followed by oncology.        GERD (gastroesophageal reflux disease)    Has a large paraesophageal hernia.  Symptoms as outlined.  On aciphex and carafate.  Has seen GI.  Prefers to see [facility] specialist to discuss treatment options.       Hypercholesterolemia    Continue crestor.  Low cholesterol diet and exercise.  Follow lipid panel and liver function tests.        Relevant Medications   rosuvastatin (CRESTOR) 5 MG tablet   Other Relevant Orders   Hepatic function panel   Lipid panel   Hyperglycemia    Low carb diet and exercise.  Follow met b and a1c.        Relevant Orders   Hemoglobin A1c   Hypertension    Continue avapro and triam/hctz.  Blood pressures as outlined.  Follow pressures.  Follow metabolic panel.       Relevant Medications   rosuvastatin (CRESTOR) 5 MG tablet   Other Relevant Orders   CBC with Differential/Platelet   Basic metabolic panel   Obstructive sleep apnea    Continue cpap.           I discussed the assessment and treatment plan with the patient. The patient was provided an opportunity to ask questions and all were answered. The patient agreed with the plan and demonstrated an understanding of the instructions.   The patient was advised to call back or seek an in-person evaluation if the symptoms worsen or if the condition fails to improve as  anticipated.    Einar Pheasant, MD

## 2021-04-12 ENCOUNTER — Encounter: Payer: Self-pay | Admitting: Internal Medicine

## 2021-04-12 NOTE — Assessment & Plan Note (Signed)
Has a large paraesophageal hernia.  Symptoms as outlined.  On aciphex and carafate.  Has seen GI.  Prefers to see [facility] specialist to discuss treatment options.

## 2021-04-12 NOTE — Assessment & Plan Note (Signed)
On arimidex. Followed by oncology.  

## 2021-04-12 NOTE — Assessment & Plan Note (Signed)
Continue crestor.  Low cholesterol diet and exercise. Follow lipid panel and liver function tests.   

## 2021-04-12 NOTE — Assessment & Plan Note (Signed)
Low carb diet and exercise.  Follow met b and a1c.   

## 2021-04-12 NOTE — Assessment & Plan Note (Signed)
Continue avapro and triam/hctz.  Blood pressures as outlined.  Follow pressures.  Follow metabolic panel.  

## 2021-04-12 NOTE — Assessment & Plan Note (Signed)
Continue cpap.  

## 2021-04-23 ENCOUNTER — Encounter: Payer: Self-pay | Admitting: Physical Therapy

## 2021-04-23 DIAGNOSIS — M5442 Lumbago with sciatica, left side: Secondary | ICD-10-CM

## 2021-04-23 DIAGNOSIS — R262 Difficulty in walking, not elsewhere classified: Secondary | ICD-10-CM

## 2021-04-23 NOTE — Therapy (Signed)
[facility] [address] [place], [address] Phone: [phone]   Fax:  [phone]  Physical Therapy No-Visit Discharge Summary Dates of reporting: 12/31/2020 - 04/23/2021  Patient Details  Name: Carla Ryan MRN: [id number] Date of Birth: Oct 05, 1946 Referring Provider (PT): Einar Pheasant, MD   Encounter Date: 04/23/2021    Past Medical History:  Diagnosis Date  . Abdominal hernia   . Anxiety    situational stress and anxiety  . Cancer ([name]) 2015   breast   . Cataract cortical, senile, unspecified laterality   . Chest pain   . Exertional dyspnea   . GERD (gastroesophageal reflux disease)   . Hiatal hernia   . Hiatal hernia   . Hypercholesterolemia   . Hyperlipidemia   . Hypertension   . Osteoarthritis    scoliosis, degenerative disc dz, knee, carpal tunnel  . Sleep apnea     Past Surgical History:  Procedure Laterality Date  . ABDOMINAL HYSTERECTOMY  07/2000  . APPENDECTOMY  2000  . APPENDECTOMY    . BREAST BIOPSY Bilateral 2014  . BREAST SURGERY Right 2015   lumpectomy  . CARPAL TUNNEL RELEASE    . COLONOSCOPY  2011   [name], [name]: Normal exam  . COLONOSCOPY WITH PROPOFOL N/A 04/03/2020   Procedure: COLONOSCOPY WITH PROPOFOL;  Surgeon: Toledo, Benay Pike, MD;  Location: [facility];  Service: Gastroenterology;  Laterality: N/A;  . ESOPHAGOGASTRODUODENOSCOPY (EGD) WITH PROPOFOL N/A 04/03/2020   Procedure: ESOPHAGOGASTRODUODENOSCOPY (EGD) WITH PROPOFOL;  Surgeon: Toledo, Benay Pike, MD;  Location: [facility]COPY;  Service: Gastroenterology;  Laterality: N/A;  . HERNIA REPAIR  02/10/15  . Laparoscopic Repair of Lateral Abdominal Wall Hernia  02/10/15  . MASTECTOMY    . UPPER GI ENDOSCOPY  05/10/2014   [name], [name] Bile gastritis, medium hiatal hernia. Incomplete visualization of the cardia and fundus.    There were no vitals filed for this visit.   Subjective Assessment  - 04/23/21 1128    Subjective Patient did not return for PT before cert ran out after taking a tenative break from PT due to possible hernia surgery, etc. Last visit was 03/17/2021.    Pertinent History Patient is a 75 y.o. female who presents to outpatient physical therapy with a referral for medical diagnosis low back pain. This patient's chief complaints consist of episodic low back pain with most recent episode starting Nov/Dec 2021, radiation down left leg above knee, leading to the following functional deficits: difficulty with walking, bending, gardening, picking strawberries, walking, standing, cooking, walking up hill, etc.  Relevant past medical history and comorbidities include scoliosis,right upper back/shoulder region (referred to Dr. Sharlet [place]), breast cancer in  2015 (takes a hormome therapy, followed by cancer center), fatigue, osteopenia, OSA, GED, abdominal hernia (repair 2016), shortness of breath after eating, hiatial hernia (supposed to see surgeon), dizziness (episodes in Aug 2021), HTN, see chart for additional history.  Patient denies hx of stroke, seizures, lung problem, major cardiac events, diabetes, unexplained weight loss, changes in bowel or bladder problems, new onset stumbling or dropping things.    Limitations Standing;Walking;Lifting;House hold activities    How long can you sit comfortably? doesn't bother her much    How long can you stand comfortably? 30-60 min in the kitchen but it hurts    How long can you walk comfortably? 10-15 min    Diagnostic tests no recent imaging  Lumbar/thoracic MRI report from 09/09/2014: "Impression:   1. Severe levoscoliosis of  the thoracolumbar spine, centered at L1-L2. No   canal or foraminal stenosis in the thoracic spine. Multilevel degenerative   disc disease in the lumbar spine without advanced canal or foraminal   stenosis.   2. No pathologic marrow signal noted. "           OBJECTIVE Patient is not present for examination at this  time. Please see previous documentation for latest objective data.     PT Short Term Goals - 01/13/21 1046      PT SHORT TERM GOAL #1   Title Be independent with initial home exercise program for self-management of symptoms.    Baseline initial HEP provided at IE (12/31/2020);    Time 2    Period Weeks    Status Achieved    Target Date 01/15/21             PT Long Term Goals - 04/23/21 1132      PT LONG TERM GOAL #1   Title Be independent with a long-term home exercise program for self-management of symptoms.    Baseline initial HEP provided at IE (12/31/2020); patient currently participating well (02/23/2021);    Time 12    Period Weeks    Status Partially Met   TARGET DATE FOR ALL LONG TERM GOALS: 03/25/2021     PT LONG TERM GOAL #2   Title Demonstrate improved FOTO score to equal or greater than 66 by visit #11 demonstrate improvement in overall condition and self-reported functional ability.    Baseline 56 (12/31/2020); 50 at visit 10 (02/23/2021);    Time 12    Period Weeks    Status On-going      PT LONG TERM GOAL #3   Title Patient will complete 1000 feet during 6 Minute Walk Test to demonstrate improved community mobility.    Baseline to be tested on visit 2 (01/02/2020); 1350 with pain (01/13/2021); 1554 feet with pain starting at only the last 3 laps (02/23/2021);    Time 12    Period Weeks    Status Achieved      PT LONG TERM GOAL #4   Title Reduce pain with functional activities to equal or less than 1/10 to allow patient to complete usual activities including ADLs, IADLs, and social engagement with less difficulty.    Baseline 8-9/10 (12/31/2020); up to 4/10 (02/23/2021);    Time 12    Period Weeks    Status Partially Met      PT LONG TERM GOAL #5   Title Complete community, work and/or recreational activities without limitation due to current condition.    Baseline Functional Limitations: bending, gardening, picking strawberries, walking, standing, cooking, walking up  hill, etc (12/31/2020); improved standing while doing hair, improved bending and yardwork during activity but still very bothersome afterwards, continues to have some trouble walking up a hill (02/23/2021);    Time 12    Period Weeks    Status Partially Met                 Plan - 04/23/21 1132    Clinical Impression Statement Patient attended 14 physical therapy sessions this episode of care and Mde good progress towards several goals. Did not show any improvement on FOTO and requested to discontinue PT due to a lot of things going on in her life and possible hernia surgery coming up. Patient is now discharged from PT due to not returning to PT.    Personal Factors and Comorbidities Age;Comorbidity 3+;Past/Current Experience;Fitness;Time  since onset of injury/illness/exacerbation    Comorbidities Relevant past medical history and comorbidities include scoliosis,right upper back/shoulder region (referred to Dr. Sharlet [place]), breast cancer in  2015 (takes a hormome therapy, followed by cancer center), fatigue, osteopenia, OSA, GED, abdominal hernia (repair 2016), shortness of breath after eating, hiatial hernia (supposed to see surgeon), dizziness (episodes in Aug 2021), HTN, see chart for additional history.    Examination-Activity Limitations Caring for Others;Carry;Locomotion Level;Stand;Stairs;Lift;Squat;Sleep;Transfers    Examination-Participation Restrictions Laundry;Cleaning;Community Activity;Yard Work;Interpersonal Relationship   bending, gardening, picking strawberries, walking, standing, cooking, walking up hill, etc   Stability/Clinical Decision Making Stable/Uncomplicated    Rehab Potential Good    PT Frequency 2x / week    PT Duration 12 weeks    PT Treatment/Interventions ADLs/Self Care Home Management;Cryotherapy;Moist Heat;Electrical Stimulation;Therapeutic activities;Therapeutic exercise;Balance training;Neuromuscular re-education;Patient/family education;Manual techniques;Dry  needling;Passive range of motion;Joint Manipulations;Spinal Manipulations    PT Next Visit Plan discharged from [facility] Access Code: [email or web address]    Consulted and Agree with Plan of Care Patient           Patient will benefit from skilled therapeutic intervention in order to improve the following deficits and impairments:  Improper body mechanics,Pain,Postural dysfunction,Increased muscle spasms,Decreased coordination,Decreased mobility,Decreased activity tolerance,Decreased endurance,Decreased range of motion,Decreased strength,Hypomobility,Impaired perceived functional ability,Difficulty walking,Impaired flexibility  Visit Diagnosis: Bilateral low back pain with left-sided sciatica, unspecified chronicity  Difficulty in walking, not elsewhere classified     Problem List Patient Active Problem List   Diagnosis Date Noted  . Has immunity to COVID-19 virus 09/18/2020  . Headache 08/22/2020  . Dizziness 08/22/2020  . Stress 04/20/2020  . Decreased GFR 03/11/2020  . Leg pain 11/04/2019  . Osteopenia 11/04/2018  . Rectal bleeding 06/27/2018  . Joint pain 06/08/2016  . Sleeping difficulty 04/06/2015  . SOB (shortness of breath) 02/02/2015  . Health care maintenance 02/02/2015  . Paraesophageal hiatal hernia 01/21/2015  . Esophageal reflux 01/21/2015  . Hernia of abdominal cavity 10/22/2014  . Back pain 10/13/2014  . Hyperglycemia 10/08/2014  . Abdominal hernia 10/08/2014  . Breast cancer ([place]) 07/09/2014  . Knee pain, right 07/09/2014  . Obstructive sleep apnea 12/23/2013  . Chest pain 10/01/2013  . Hypertension 10/25/2012  . GERD (gastroesophageal reflux disease) 10/25/2012  . Hypercholesterolemia 10/25/2012  . Allergic rhinitis 10/25/2012    Everlean Alstrom. Graylon Good, PT, DPT 04/23/21, 11:33 AM  [facility] [address] [place], [address] Phone: [phone]   Fax:   [phone]  Name: DIMOND CROTTY MRN: [id number] Date of Birth: 05/19/1946

## 2021-04-28 ENCOUNTER — Other Ambulatory Visit: Payer: Self-pay

## 2021-04-28 ENCOUNTER — Other Ambulatory Visit (INDEPENDENT_AMBULATORY_CARE_PROVIDER_SITE_OTHER): Payer: Medicare HMO

## 2021-04-28 DIAGNOSIS — R739 Hyperglycemia, unspecified: Secondary | ICD-10-CM

## 2021-04-28 DIAGNOSIS — G4733 Obstructive sleep apnea (adult) (pediatric): Secondary | ICD-10-CM | POA: Diagnosis not present

## 2021-04-28 DIAGNOSIS — I10 Essential (primary) hypertension: Secondary | ICD-10-CM

## 2021-04-28 DIAGNOSIS — E7800 Pure hypercholesterolemia, unspecified: Secondary | ICD-10-CM

## 2021-04-28 LAB — LIPID PANEL
Cholesterol: 170 mg/dL (ref 0–200)
HDL: 70.2 mg/dL (ref >39.00)
LDL Cholesterol: 81 mg/dL (ref 0–99)
NonHDL: 99.99
Total CHOL/HDL Ratio: 2
Triglycerides: 95 mg/dL (ref 0.0–149.0)
VLDL: 19 mg/dL (ref 0.0–40.0)

## 2021-04-28 LAB — CBC WITH DIFFERENTIAL/PLATELET
Basophils Absolute: 0 10*3/uL (ref 0.0–0.1)
Basophils Relative: 0.7 % (ref 0.0–3.0)
Eosinophils Absolute: 0.2 10*3/uL (ref 0.0–0.7)
Eosinophils Relative: 4.6 % (ref 0.0–5.0)
HCT: 38.6 % (ref 36.0–46.0)
Hemoglobin: 13.2 g/dL (ref 12.0–15.0)
Lymphocytes Relative: 27 % (ref 12.0–46.0)
Lymphs Abs: 1.4 10*3/uL (ref 0.7–4.0)
MCHC: 34.2 g/dL (ref 30.0–36.0)
MCV: 91 fl (ref 78.0–100.0)
Monocytes Absolute: 0.4 10*3/uL (ref 0.1–1.0)
Monocytes Relative: 8.6 % (ref 3.0–12.0)
Neutro Abs: 3 10*3/uL (ref 1.4–7.7)
Neutrophils Relative %: 59.1 % (ref 43.0–77.0)
Platelets: 222 10*3/uL (ref 150.0–400.0)
RBC: 4.24 Mil/uL (ref 3.87–5.11)
RDW: 14.7 % (ref 11.5–15.5)
WBC: 5.1 10*3/uL (ref 4.0–10.5)

## 2021-04-28 LAB — HEPATIC FUNCTION PANEL
ALT: 17 U/L (ref 0–35)
AST: 17 U/L (ref 0–37)
Albumin: 4.1 g/dL (ref 3.5–5.2)
Alkaline Phosphatase: 44 U/L (ref 39–117)
Bilirubin, Direct: 0.1 mg/dL (ref 0.0–0.3)
Total Bilirubin: 0.6 mg/dL (ref 0.2–1.2)
Total Protein: 6.5 g/dL (ref 6.0–8.3)

## 2021-04-28 LAB — BASIC METABOLIC PANEL
BUN: 20 mg/dL (ref 6–23)
CO2: 28 mEq/L (ref 19–32)
Calcium: 9.6 mg/dL (ref 8.4–10.5)
Chloride: 101 mEq/L (ref 96–112)
Creatinine, Ser: 0.9 mg/dL (ref 0.40–1.20)
GFR: 61.79 mL/min (ref >60.00)
Glucose, Bld: 101 mg/dL — ABNORMAL HIGH (ref 70–99)
Potassium: 3.9 mEq/L (ref 3.5–5.1)
Sodium: 137 mEq/L (ref 135–145)

## 2021-04-28 LAB — HEMOGLOBIN A1C: Hgb A1c MFr Bld: 6.2 % (ref 4.6–6.5)

## 2021-04-29 ENCOUNTER — Other Ambulatory Visit: Payer: BC Managed Care – PPO

## 2021-04-29 ENCOUNTER — Telehealth: Payer: Self-pay | Admitting: Internal Medicine

## 2021-04-29 NOTE — Telephone Encounter (Signed)
Called GI.  They have sent referral to [facility] specialist - to evaluate for paraesophageal hernia repair.

## 2021-05-06 DIAGNOSIS — M5412 Radiculopathy, cervical region: Secondary | ICD-10-CM | POA: Diagnosis not present

## 2021-05-06 DIAGNOSIS — M4802 Spinal stenosis, cervical region: Secondary | ICD-10-CM | POA: Diagnosis not present

## 2021-05-07 ENCOUNTER — Telehealth: Payer: Self-pay | Admitting: Internal Medicine

## 2021-05-07 NOTE — Telephone Encounter (Signed)
Left message with patient's husband  for patient to call back and schedule Medicare Annual Wellness Visit (AWV) in office.   If not able to come in office, please offer to do virtually or by telephone.   Last AWV:05/03/2018   Please schedule at anytime with Nurse Health Advisor.

## 2021-05-25 ENCOUNTER — Other Ambulatory Visit: Payer: Self-pay | Admitting: Internal Medicine

## 2021-05-26 DIAGNOSIS — K449 Diaphragmatic hernia without obstruction or gangrene: Secondary | ICD-10-CM | POA: Diagnosis not present

## 2021-05-26 DIAGNOSIS — K2289 Other specified disease of esophagus: Secondary | ICD-10-CM | POA: Diagnosis not present

## 2021-05-26 DIAGNOSIS — K219 Gastro-esophageal reflux disease without esophagitis: Secondary | ICD-10-CM | POA: Diagnosis not present

## 2021-05-26 DIAGNOSIS — K224 Dyskinesia of esophagus: Secondary | ICD-10-CM | POA: Diagnosis not present

## 2021-05-29 DIAGNOSIS — G4733 Obstructive sleep apnea (adult) (pediatric): Secondary | ICD-10-CM | POA: Diagnosis not present

## 2021-06-04 ENCOUNTER — Other Ambulatory Visit: Payer: Self-pay | Admitting: Physical Medicine and Rehabilitation

## 2021-06-04 DIAGNOSIS — M5030 Other cervical disc degeneration, unspecified cervical region: Secondary | ICD-10-CM | POA: Diagnosis not present

## 2021-06-04 DIAGNOSIS — M48062 Spinal stenosis, lumbar region with neurogenic claudication: Secondary | ICD-10-CM

## 2021-06-04 DIAGNOSIS — M4802 Spinal stenosis, cervical region: Secondary | ICD-10-CM | POA: Diagnosis not present

## 2021-06-04 DIAGNOSIS — M5416 Radiculopathy, lumbar region: Secondary | ICD-10-CM | POA: Diagnosis not present

## 2021-06-04 DIAGNOSIS — M5412 Radiculopathy, cervical region: Secondary | ICD-10-CM | POA: Diagnosis not present

## 2021-06-12 ENCOUNTER — Telehealth: Payer: Self-pay | Admitting: Internal Medicine

## 2021-06-12 NOTE — Telephone Encounter (Signed)
Left message for patient to call back and schedule Medicare Annual Wellness Visit (AWV) in office.   If not able to come in office, please offer to do virtually or by telephone.   Last AWV:05/03/2018  Please schedule at anytime with Nurse Health Advisor.

## 2021-06-16 ENCOUNTER — Ambulatory Visit: Admission: RE | Admit: 2021-06-16 | Payer: Medicare HMO | Source: Ambulatory Visit

## 2021-06-23 ENCOUNTER — Other Ambulatory Visit: Payer: Self-pay

## 2021-06-23 ENCOUNTER — Ambulatory Visit
Admission: RE | Admit: 2021-06-23 | Discharge: 2021-06-23 | Disposition: A | Discharge status: Home / Self Care | Payer: Medicare HMO | Source: Ambulatory Visit | Attending: Physical Medicine and Rehabilitation | Admitting: Physical Medicine and Rehabilitation

## 2021-06-23 DIAGNOSIS — M48062 Spinal stenosis, lumbar region with neurogenic claudication: Secondary | ICD-10-CM | POA: Insufficient documentation

## 2021-06-23 DIAGNOSIS — M545 Low back pain, unspecified: Secondary | ICD-10-CM | POA: Diagnosis not present

## 2021-06-28 DIAGNOSIS — G4733 Obstructive sleep apnea (adult) (pediatric): Secondary | ICD-10-CM | POA: Diagnosis not present

## 2021-07-20 DIAGNOSIS — M48062 Spinal stenosis, lumbar region with neurogenic claudication: Secondary | ICD-10-CM | POA: Diagnosis not present

## 2021-07-20 DIAGNOSIS — M5416 Radiculopathy, lumbar region: Secondary | ICD-10-CM | POA: Diagnosis not present

## 2021-07-28 ENCOUNTER — Encounter: Payer: Self-pay | Admitting: Internal Medicine

## 2021-07-28 DIAGNOSIS — I10 Essential (primary) hypertension: Secondary | ICD-10-CM

## 2021-07-28 DIAGNOSIS — R739 Hyperglycemia, unspecified: Secondary | ICD-10-CM

## 2021-07-28 DIAGNOSIS — E7800 Pure hypercholesterolemia, unspecified: Secondary | ICD-10-CM

## 2021-07-29 DIAGNOSIS — L814 Other melanin hyperpigmentation: Secondary | ICD-10-CM | POA: Diagnosis not present

## 2021-07-29 DIAGNOSIS — L578 Other skin changes due to chronic exposure to nonionizing radiation: Secondary | ICD-10-CM | POA: Diagnosis not present

## 2021-07-29 DIAGNOSIS — Z86018 Personal history of other benign neoplasm: Secondary | ICD-10-CM | POA: Diagnosis not present

## 2021-07-29 NOTE — Telephone Encounter (Signed)
Labs ordered. Patient scheduled

## 2021-07-30 DIAGNOSIS — K449 Diaphragmatic hernia without obstruction or gangrene: Secondary | ICD-10-CM | POA: Diagnosis not present

## 2021-07-30 DIAGNOSIS — K219 Gastro-esophageal reflux disease without esophagitis: Secondary | ICD-10-CM | POA: Diagnosis not present

## 2021-08-03 ENCOUNTER — Other Ambulatory Visit (INDEPENDENT_AMBULATORY_CARE_PROVIDER_SITE_OTHER): Payer: Medicare HMO

## 2021-08-03 ENCOUNTER — Other Ambulatory Visit: Payer: Self-pay

## 2021-08-03 DIAGNOSIS — I10 Essential (primary) hypertension: Secondary | ICD-10-CM

## 2021-08-03 DIAGNOSIS — E7800 Pure hypercholesterolemia, unspecified: Secondary | ICD-10-CM | POA: Diagnosis not present

## 2021-08-03 DIAGNOSIS — R739 Hyperglycemia, unspecified: Secondary | ICD-10-CM

## 2021-08-03 LAB — BASIC METABOLIC PANEL
BUN: 16 mg/dL (ref 6–23)
CO2: 27 mEq/L (ref 19–32)
Calcium: 9.8 mg/dL (ref 8.4–10.5)
Chloride: 101 mEq/L (ref 96–112)
Creatinine, Ser: 0.9 mg/dL (ref 0.40–1.20)
GFR: 61.67 mL/min (ref >60.00)
Glucose, Bld: 95 mg/dL (ref 70–99)
Potassium: 4 mEq/L (ref 3.5–5.1)
Sodium: 138 mEq/L (ref 135–145)

## 2021-08-03 LAB — LIPID PANEL
Cholesterol: 174 mg/dL (ref 0–200)
HDL: 71.2 mg/dL (ref >39.00)
LDL Cholesterol: 80 mg/dL (ref 0–99)
NonHDL: 103.18
Total CHOL/HDL Ratio: 2
Triglycerides: 115 mg/dL (ref 0.0–149.0)
VLDL: 23 mg/dL (ref 0.0–40.0)

## 2021-08-03 LAB — HEPATIC FUNCTION PANEL
ALT: 15 U/L (ref 0–35)
AST: 16 U/L (ref 0–37)
Albumin: 4.2 g/dL (ref 3.5–5.2)
Alkaline Phosphatase: 46 U/L (ref 39–117)
Bilirubin, Direct: 0.1 mg/dL (ref 0.0–0.3)
Total Bilirubin: 0.7 mg/dL (ref 0.2–1.2)
Total Protein: 6.3 g/dL (ref 6.0–8.3)

## 2021-08-03 LAB — HEMOGLOBIN A1C: Hgb A1c MFr Bld: 6.2 % (ref 4.6–6.5)

## 2021-08-05 ENCOUNTER — Other Ambulatory Visit: Payer: Self-pay

## 2021-08-05 ENCOUNTER — Ambulatory Visit (INDEPENDENT_AMBULATORY_CARE_PROVIDER_SITE_OTHER): Payer: Medicare HMO | Admitting: Internal Medicine

## 2021-08-05 VITALS — BP 128/76 | HR 73 | Temp 97.0°F | Resp 16 | Ht 59.0 in | Wt 143.0 lb

## 2021-08-05 DIAGNOSIS — R739 Hyperglycemia, unspecified: Secondary | ICD-10-CM

## 2021-08-05 DIAGNOSIS — I10 Essential (primary) hypertension: Secondary | ICD-10-CM

## 2021-08-05 DIAGNOSIS — E7800 Pure hypercholesterolemia, unspecified: Secondary | ICD-10-CM

## 2021-08-05 DIAGNOSIS — F439 Reaction to severe stress, unspecified: Secondary | ICD-10-CM

## 2021-08-05 DIAGNOSIS — J309 Allergic rhinitis, unspecified: Secondary | ICD-10-CM

## 2021-08-05 DIAGNOSIS — C50919 Malignant neoplasm of unspecified site of unspecified female breast: Secondary | ICD-10-CM

## 2021-08-05 DIAGNOSIS — G4733 Obstructive sleep apnea (adult) (pediatric): Secondary | ICD-10-CM | POA: Diagnosis not present

## 2021-08-05 DIAGNOSIS — K219 Gastro-esophageal reflux disease without esophagitis: Secondary | ICD-10-CM | POA: Diagnosis not present

## 2021-08-05 DIAGNOSIS — M545 Low back pain, unspecified: Secondary | ICD-10-CM

## 2021-08-05 NOTE — Progress Notes (Signed)
Patient ID: Carla Ryan, female   DOB: Feb 15, 1946, 75 y.o.   MRN: [id number]   Subjective:    Patient ID: Carla Ryan, female    DOB: [birth year], 75 y.o.   MRN: [id number]  HPI This visit occurred during the SARS-CoV-2 public health emergency.  Safety protocols were in place, including screening questions prior to the visit, additional usage of staff PPE, and extensive cleaning of exam room while observing appropriate contact time as indicated for disinfecting solutions.   Patient here for a scheduled follow up.  Here to follow up regarding her cholesterol and blood pressure.  Still having persistent back pain.  Sees Dr Sharlet [place].  S/p injection in the spring.  Had some minimal improvement. Due to f/u with him next week.  Does limit how she does her activity at home.  Still able to do her activity.  No chest pain.  Breathing stable.  Using cpap.  Some intermittent drainage.  Took zyrtec last night.  Adjusts antihistamines.  Previously saw GI.  Started on IBGard.  Some benefit.     Past Medical History:  Diagnosis Date   Abdominal hernia    Anxiety    situational stress and anxiety   Cancer ([place]) 2015   breast    Cataract cortical, senile, unspecified laterality    Chest pain    Exertional dyspnea    GERD (gastroesophageal reflux disease)    Hiatal hernia    Hiatal hernia    Hypercholesterolemia    Hyperlipidemia    Hypertension    Osteoarthritis    scoliosis, degenerative disc dz, knee, carpal tunnel   Sleep apnea    Past Surgical History:  Procedure Laterality Date   ABDOMINAL HYSTERECTOMY  07/2000   APPENDECTOMY  2000   APPENDECTOMY     BREAST BIOPSY Bilateral 2014   BREAST SURGERY Right 2015   lumpectomy   CARPAL TUNNEL RELEASE     COLONOSCOPY  2011   [name], [name]: Normal exam   COLONOSCOPY WITH PROPOFOL N/A 04/03/2020   Procedure: COLONOSCOPY WITH PROPOFOL;  Surgeon: Toledo, Benay Pike, MD;  Location: [facility];  Service: Gastroenterology;  Laterality:  N/A;   ESOPHAGOGASTRODUODENOSCOPY (EGD) WITH PROPOFOL N/A 04/03/2020   Procedure: ESOPHAGOGASTRODUODENOSCOPY (EGD) WITH PROPOFOL;  Surgeon: Toledo, Benay Pike, MD;  Location: [facility];  Service: Gastroenterology;  Laterality: N/A;   HERNIA REPAIR  02/10/15   Laparoscopic Repair of Lateral Abdominal Wall Hernia  02/10/15   MASTECTOMY     UPPER GI ENDOSCOPY  05/10/2014   [name], [name] Bile gastritis, medium hiatal hernia. Incomplete visualization of the cardia and fundus.   Family History  Problem Relation Age of Onset   Diabetes Mother    Multiple myeloma Mother    Diabetes Brother    Diabetes Sister        x2   Hypertension Brother    Multiple myeloma Father    Social History   Socioeconomic History   Marital status: Married    Spouse name: Not on file   Number of children: 2   Years of education: Not on file   Highest education level: Not on file  Occupational History   Not on file  Tobacco Use   Smoking status: Never   Smokeless tobacco: Never  Vaping Use   Vaping Use: Never used  Substance and Sexual Activity   Alcohol use: Yes    Alcohol/week: 0.0 standard drinks    Comment: wine occasionally   Drug use: No   Sexual  activity: Yes  Other Topics Concern   Not on file  Social History Narrative   Not on file   Social Determinants of Health   Financial Resource Strain: Not on file  Food Insecurity: Not on file  Transportation Needs: Not on file  Physical Activity: Not on file  Stress: Not on file  Social Connections: Not on file    Review of Systems  Constitutional:  Negative for appetite change and unexpected weight change.  HENT:  Negative for congestion and sinus pressure.   Respiratory:  Negative for cough, chest tightness and shortness of breath.   Cardiovascular:  Negative for chest pain, palpitations and leg swelling.  Gastrointestinal:  Negative for abdominal pain, diarrhea, nausea and vomiting.  Genitourinary:  Negative for difficulty  urinating and dysuria.  Musculoskeletal:  Positive for back pain. Negative for myalgias.  Skin:  Negative for color change and rash.  Neurological:  Negative for dizziness, light-headedness and headaches.  Psychiatric/Behavioral:  Negative for agitation and dysphoric mood.       Objective:    Physical Exam Vitals reviewed.  Constitutional:      General: She is not in acute distress.    Appearance: Normal appearance.  HENT:     Head: Normocephalic and atraumatic.     Right Ear: External ear normal.     Left Ear: External ear normal.  Eyes:     General: No scleral icterus.       Right eye: No discharge.        Left eye: No discharge.     Conjunctiva/sclera: Conjunctivae normal.  Neck:     Thyroid: No thyromegaly.  Cardiovascular:     Rate and Rhythm: Normal rate and regular rhythm.  Pulmonary:     Effort: No respiratory distress.     Breath sounds: Normal breath sounds. No wheezing.  Abdominal:     General: Bowel sounds are normal.     Palpations: Abdomen is soft.     Tenderness: There is no abdominal tenderness.  Musculoskeletal:        General: No swelling or tenderness.     Cervical back: Neck supple. No tenderness.  Lymphadenopathy:     Cervical: No cervical adenopathy.  Skin:    Findings: No erythema or rash.  Neurological:     Mental Status: She is alert.  Psychiatric:        Mood and Affect: Mood normal.        Behavior: Behavior normal.    BP 128/76   Pulse 73   Temp (!) 97 F (36.1 C)   Resp 16   Ht 4' 11"  (1.499 m)   Wt 143 lb (64.9 kg)   SpO2 98%   BMI 28.88 kg/m  Wt Readings from Last 3 Encounters:  08/05/21 143 lb (64.9 kg)  04/08/21 142 lb (64.4 kg)  11/26/20 142 lb 9.6 oz (64.7 kg)    Outpatient Encounter Medications as of 08/05/2021  Medication Sig   anastrozole (ARIMIDEX) 1 MG tablet TAKE 1 TABLET BY MOUTH ONCE DAILY   aspirin 81 MG tablet Take 81 mg by mouth daily.   azelastine (ASTELIN) 0.1 % nasal spray USE 2 SPRAYS IN EACH        NOSTRIL TWICE DAILY AS     DIRECTED   calcium citrate-vitamin D 500-400 MG-UNIT chewable tablet Chew 1 tablet by mouth 2 (two) times daily.   cholecalciferol (VITAMIN D) 1000 UNITS tablet Take 2,000 Units by mouth in the morning and at bedtime.  fluticasone (FLONASE) 50 MCG/ACT nasal spray PLACE 2 SPRAYS INTO BOTH NOSTRILS DAILY.   gabapentin (NEURONTIN) 100 MG capsule TAKE 2 CAPSULES AT BEDTIME   irbesartan (AVAPRO) 75 MG tablet TAKE 1 TABLET EVERY DAY   levocetirizine (XYZAL) 5 MG tablet Take 1 tablet (5 mg total) by mouth every evening.   methocarbamol (ROBAXIN) 500 MG tablet Take 1 tablet (500 mg total) by mouth at bedtime as needed for muscle spasms.   Misc Natural Products (TART CHERRY ADVANCED) CAPS Take 1 capsule by mouth 2 (two) times daily. Reported on 03/04/2016   MULTIPLE MINERALS-VITAMINS PO    Multiple Vitamin (MULTIVITAMIN) tablet Take 1 tablet by mouth daily.   nystatin cream (MYCOSTATIN) Apply 1 application topically 2 (two) times daily.   PEPPERMINT OIL PO Take by mouth.   RABEprazole (ACIPHEX) 20 MG tablet Take 1 tablet (20 mg total) by mouth 2 (two) times daily.   rosuvastatin (CRESTOR) 5 MG tablet Take 1 tablet (5 mg total) by mouth 4 (four) times a week.   sucralfate (CARAFATE) 1 g tablet Take 1 tablet (1 g total) by mouth 2 (two) times daily. (Patient taking differently: Take 1 g by mouth in the morning, at noon, and at bedtime.)   triamterene-hydrochlorothiazide (MAXZIDE-25) 37.5-25 MG tablet TAKE 1/2 TABLET EVERY DAY   No facility-administered encounter medications on file as of 08/05/2021.     Lab Results  Component Value Date   WBC 5.1 04/28/2021   HGB 13.2 04/28/2021   HCT 38.6 04/28/2021   PLT 222.0 04/28/2021   GLUCOSE 95 08/03/2021   CHOL 174 08/03/2021   TRIG 115.0 08/03/2021   HDL 71.20 08/03/2021   LDLDIRECT 146.9 01/31/2014   LDLCALC 80 08/03/2021   ALT 15 08/03/2021   AST 16 08/03/2021   NA 138 08/03/2021   K 4.0 08/03/2021   CL 101 08/03/2021    CREATININE 0.90 08/03/2021   BUN 16 08/03/2021   CO2 27 08/03/2021   TSH 2.36 11/25/2020   HGBA1C 6.2 08/03/2021    MR LUMBAR SPINE WO CONTRAST  Result Date: 06/24/2021 CLINICAL DATA:  Low back pain radiating to the left hip and lower extremity EXAM: MRI LUMBAR SPINE WITHOUT CONTRAST TECHNIQUE: Multiplanar, multisequence MR imaging of the lumbar spine was performed. No intravenous contrast was administered. COMPARISON:  None. FINDINGS: Segmentation:  Standard. Alignment:  Levoscoliosis with apex at L1. Vertebrae:  No fracture, evidence of discitis, or bone lesion. Conus medullaris and cauda equina: Conus extends to the L2 level. Conus and cauda equina appear normal. Paraspinal and other soft tissues: Negative Disc levels: T12-L1: Small disc bulge.  Mild right foraminal stenosis. L1-L2: Right asymmetric disc space narrowing with endplate spurring. No spinal canal stenosis. Mild right neural foraminal stenosis. L2-L3: Moderate facet hypertrophy. Small disc bulge. No spinal canal stenosis. Moderate left neural foraminal stenosis. L3-L4: Left asymmetric disc bulge. Mild facet hypertrophy. No spinal canal stenosis. Mild left neural foraminal stenosis. L4-L5: Moderate facet hypertrophy. Left asymmetric disc bulge. Left lateral recess narrowing without central spinal canal stenosis. No neural foraminal stenosis. L5-S1: Normal disc space and facet joints. No spinal canal stenosis. No neural foraminal stenosis. Visualized sacrum: Normal. IMPRESSION: 1. Levoscoliosis with apex at L1. 2. Moderate left L2-3 neural foraminal stenosis. 3. Mild right T12-L1, right L1-L2, left L3-L4 and left L4-L5 neural foraminal stenosis. 4. No central spinal canal stenosis. Electronically Signed   By: Ulyses Jarred [name]   On: 06/24/2021 01:48       Assessment & Plan:   Problem  List Items Addressed This Visit     Allergic rhinitis    Some drainage.  Started on zyrtec last night.  Continue nasal spray as directed.  Follow.   Notify me if persistent or worsening symptoms.       Back pain    Persistent back pain.  Seeing Dr Sharlet [place].  Has f/u next week.       Breast cancer ([place])    Continues on arimidex.  Followed by oncology.       GERD (gastroesophageal reflux disease)    Has a large paraesophageal hernia.  Symptoms as outlined.  On aciphex and carafate.  Has seen GI.        Hypercholesterolemia    Continue crestor.  Low cholesterol diet and exercise.  Follow lipid panel and liver function tests.        Relevant Orders   Lipid panel   Hepatic function panel   Hyperglycemia    Low carb diet and exercise.  Follow met b and a1c.        Relevant Orders   Hemoglobin A1c   Hypertension - Primary    Continue avapro and triam/hctz.  Blood pressures as outlined.  Follow pressures.  Follow metabolic panel.       Relevant Orders   CBC with Differential/Platelet   Basic metabolic panel   Obstructive sleep apnea    Using cpap.       Stress    Overall appears to be handling things relatively well.  Follow.         Einar Pheasant, MD

## 2021-08-10 ENCOUNTER — Encounter: Payer: Self-pay | Admitting: Internal Medicine

## 2021-08-10 NOTE — Assessment & Plan Note (Signed)
Has a large paraesophageal hernia.  Symptoms as outlined.  On aciphex and carafate.  Has seen GI.

## 2021-08-10 NOTE — Assessment & Plan Note (Signed)
Continues on arimidex.  Followed by oncology.

## 2021-08-10 NOTE — Assessment & Plan Note (Signed)
Low carb diet and exercise.  Follow met b and a1c.   

## 2021-08-10 NOTE — Assessment & Plan Note (Signed)
Using cpap

## 2021-08-10 NOTE — Assessment & Plan Note (Signed)
Continue crestor.  Low cholesterol diet and exercise. Follow lipid panel and liver function tests.   

## 2021-08-10 NOTE — Assessment & Plan Note (Signed)
Persistent back pain.  Seeing Dr Sharlet [place].  Has f/u next week.

## 2021-08-10 NOTE — Assessment & Plan Note (Signed)
Overall appears to be handling things relatively well.  Follow.   

## 2021-08-10 NOTE — Assessment & Plan Note (Signed)
Some drainage.  Started on zyrtec last night.  Continue nasal spray as directed.  Follow.  Notify me if persistent or worsening symptoms.

## 2021-08-10 NOTE — Assessment & Plan Note (Signed)
Continue avapro and triam/hctz.  Blood pressures as outlined.  Follow pressures.  Follow metabolic panel.  

## 2021-08-13 DIAGNOSIS — M5416 Radiculopathy, lumbar region: Secondary | ICD-10-CM | POA: Diagnosis not present

## 2021-08-13 DIAGNOSIS — M48062 Spinal stenosis, lumbar region with neurogenic claudication: Secondary | ICD-10-CM | POA: Diagnosis not present

## 2021-08-20 IMAGING — CT CT HEAD W/O CM
4 series · 16 of 47 positions shown, 18 images · non-contrast
Comparison: None.

CLINICAL DATA: Neuro deficit, acute, stroke suspected

Headache, dizziness and nausea.
EXAM:
CT HEAD WITHOUT CONTRAST
TECHNIQUE: Contiguous axial images were obtained from the base of the skull
through the vertex without intravenous contrast.

[Series 2: head bone · axial · 0.41mm/px · z∈[-185,-159]mm · 3 of 67 slices shown]
[im 7/67  bone]
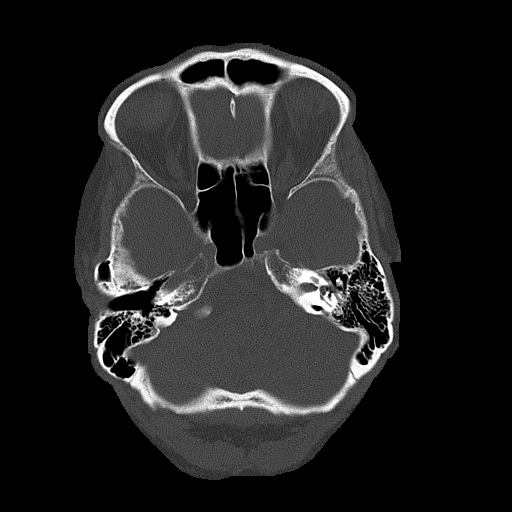
[im 14/67  bone]
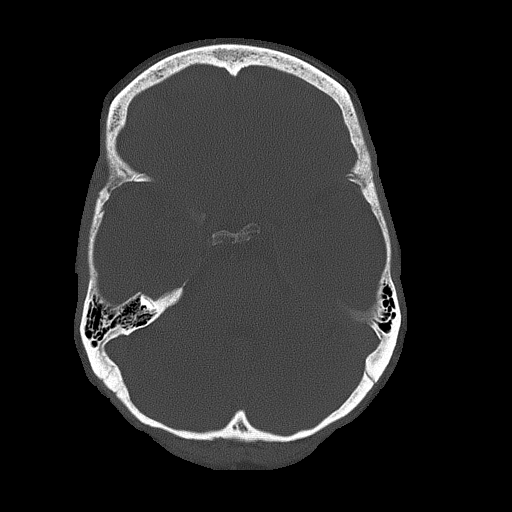
[im 20/67  bone]
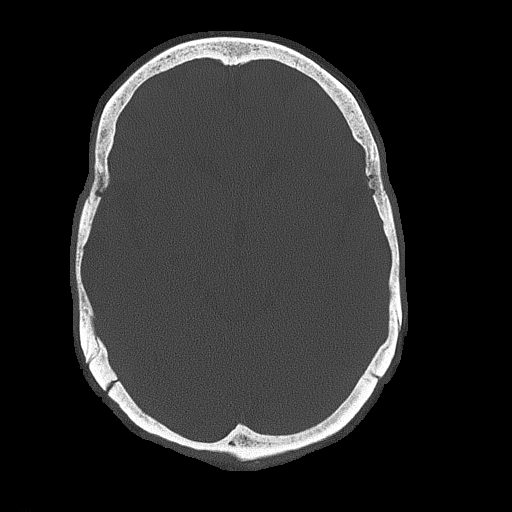

[Series 3: head wo · axial · 0.41mm/px · z∈[-182,-87]mm · 7 of 27 slices shown, 9 images]
[im 4/27  brain]
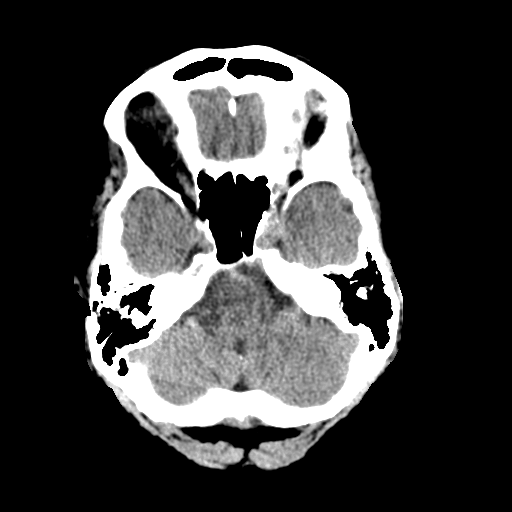
[im 4/27  bone]
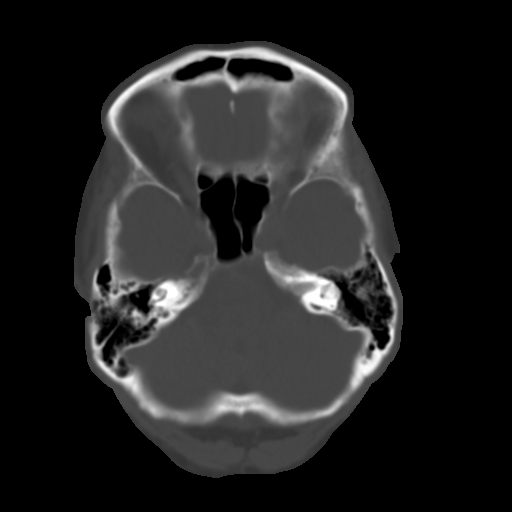
[im 7/27  brain]
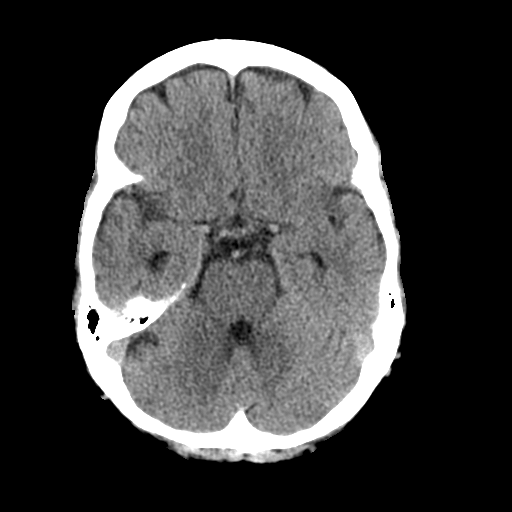
[im 10/27  brain]
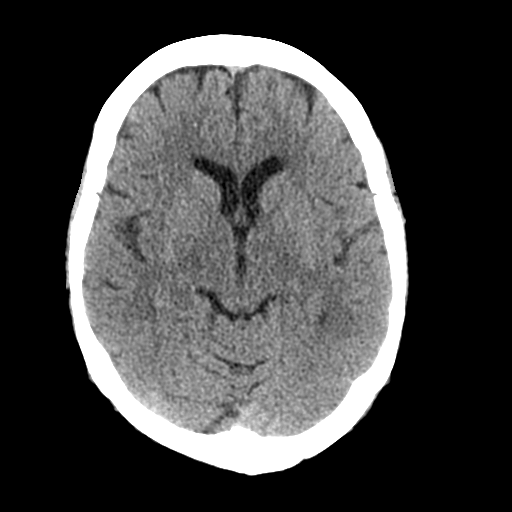
[im 14/27  brain]
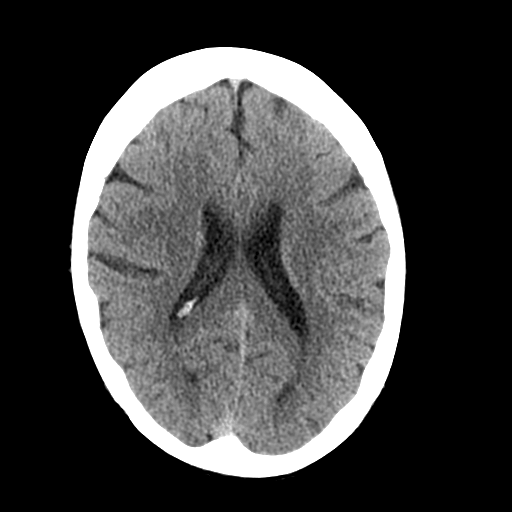
[im 17/27  brain]
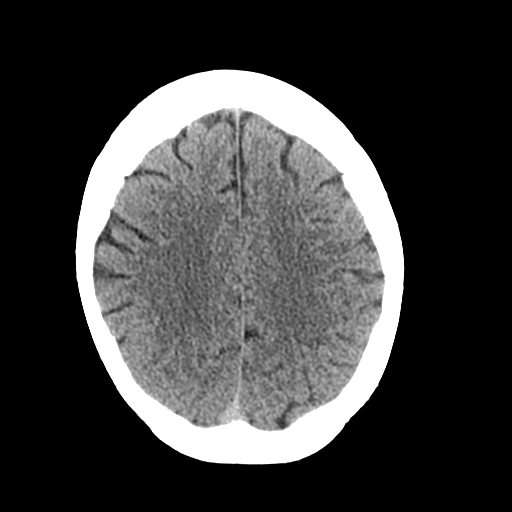
[im 17/27  bone]
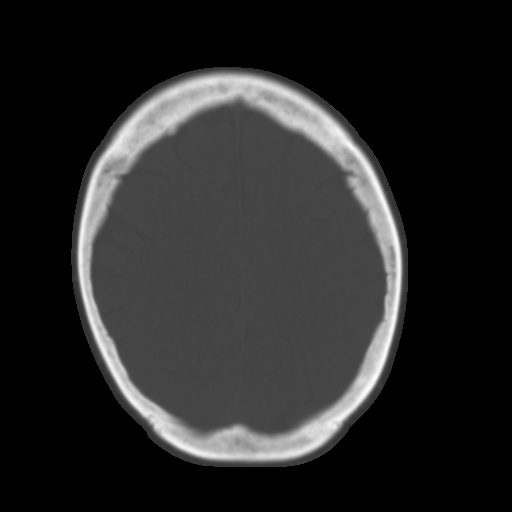
[im 20/27  brain]
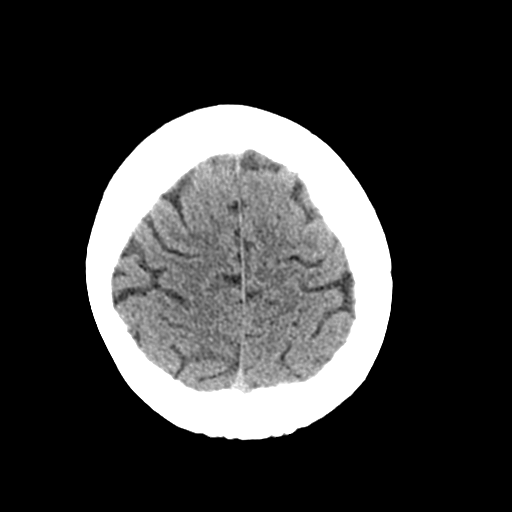
[im 23/27  brain]
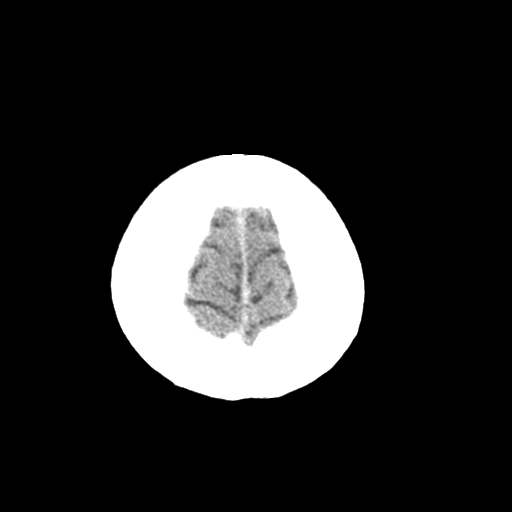

[Series 4: coronal soft tissue · coronal · 0.27mm/px · 3 of 63 slices shown]
[im 21/63  brain]
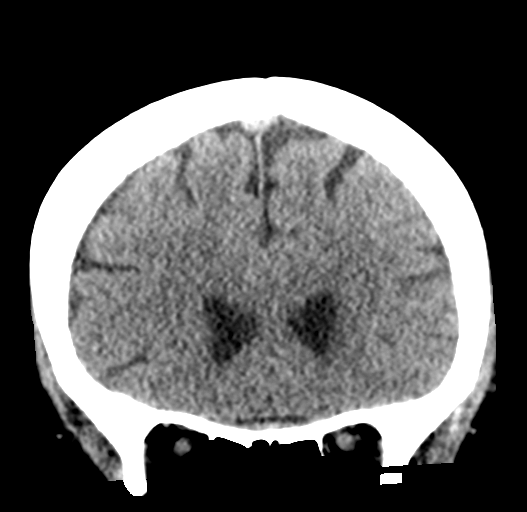
[im 28/63  brain]
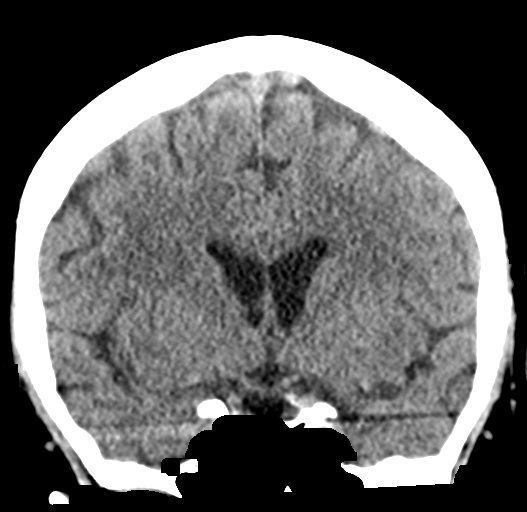
[im 35/63  brain]
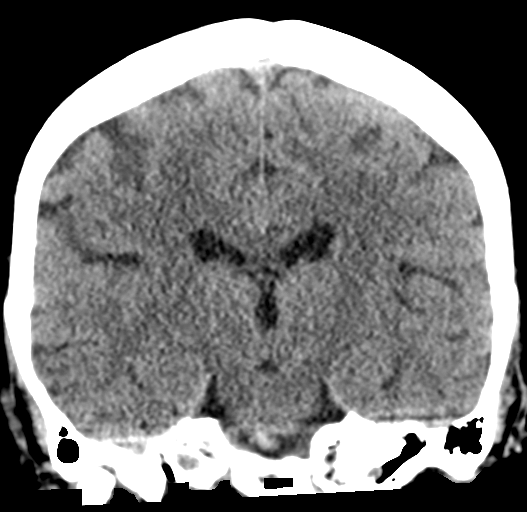

[Series 5: sagittal soft tissue · sagittal · 0.27mm/px · 3 of 48 slices shown]
[im 16/48  brain]
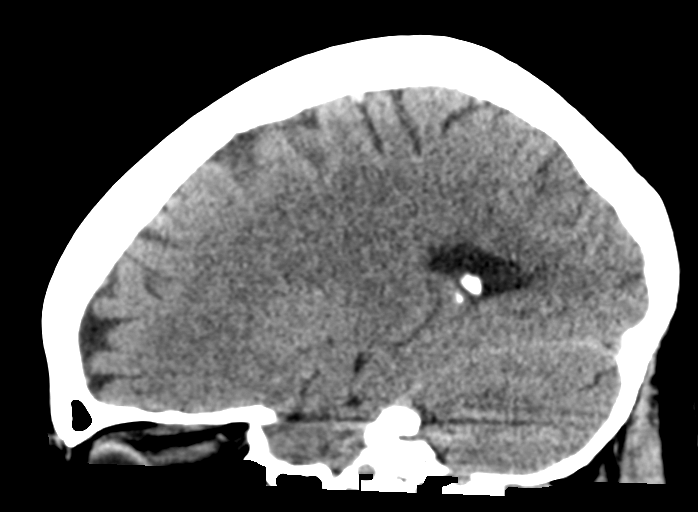
[im 24/48  brain]
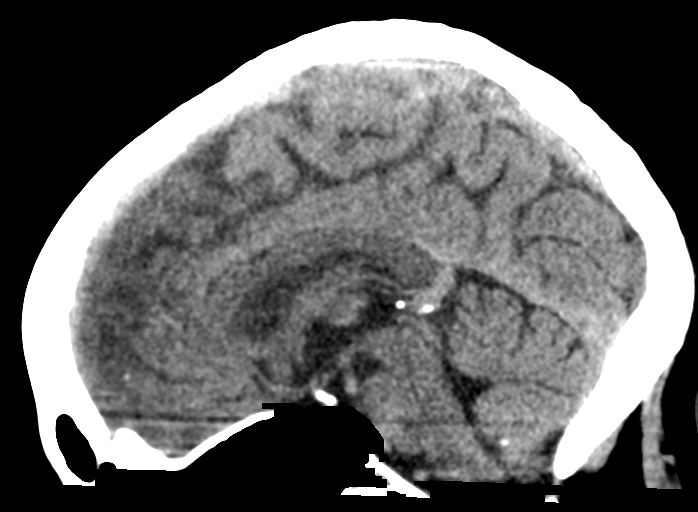
[im 32/48  brain]
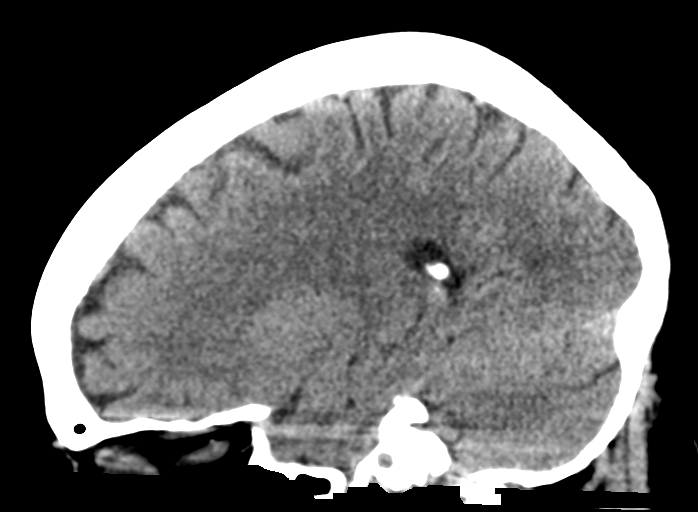

[16 of 47 positions shown; findings below may reference images not displayed]

FINDINGS: Brain: Brain volume is normal for age. No intracranial hemorrhage,
mass effect, or midline shift. No hydrocephalus. The basilar
cisterns are patent. No evidence of territorial infarct or acute
ischemia. No extra-axial or intracranial fluid collection.

Vascular: Atherosclerosis of skullbase vasculature without
hyperdense vessel or abnormal calcification.

Skull: No fracture or focal lesion.

Sinuses/Orbits: Paranasal sinuses and mastoid air cells are clear.
The visualized orbits are unremarkable. Incidental small osteoma in
the anterior left ethmoid sinus.

Other: None.
IMPRESSION: Unremarkable noncontrast head CT for age.

## 2021-08-20 IMAGING — CR DG CHEST 2V
1 series · 2 of 2 positions shown · non-contrast
Comparison: Chest radiograph dated 10/30/2018.

CLINICAL DATA: 74-year-old female with cough and shortness of
breath.

EXAM:
CHEST - 2 VIEW

[Series 1: dg chest 2 view · 0.14mm/px · 2 of 2 slices shown]
[im 1/2]
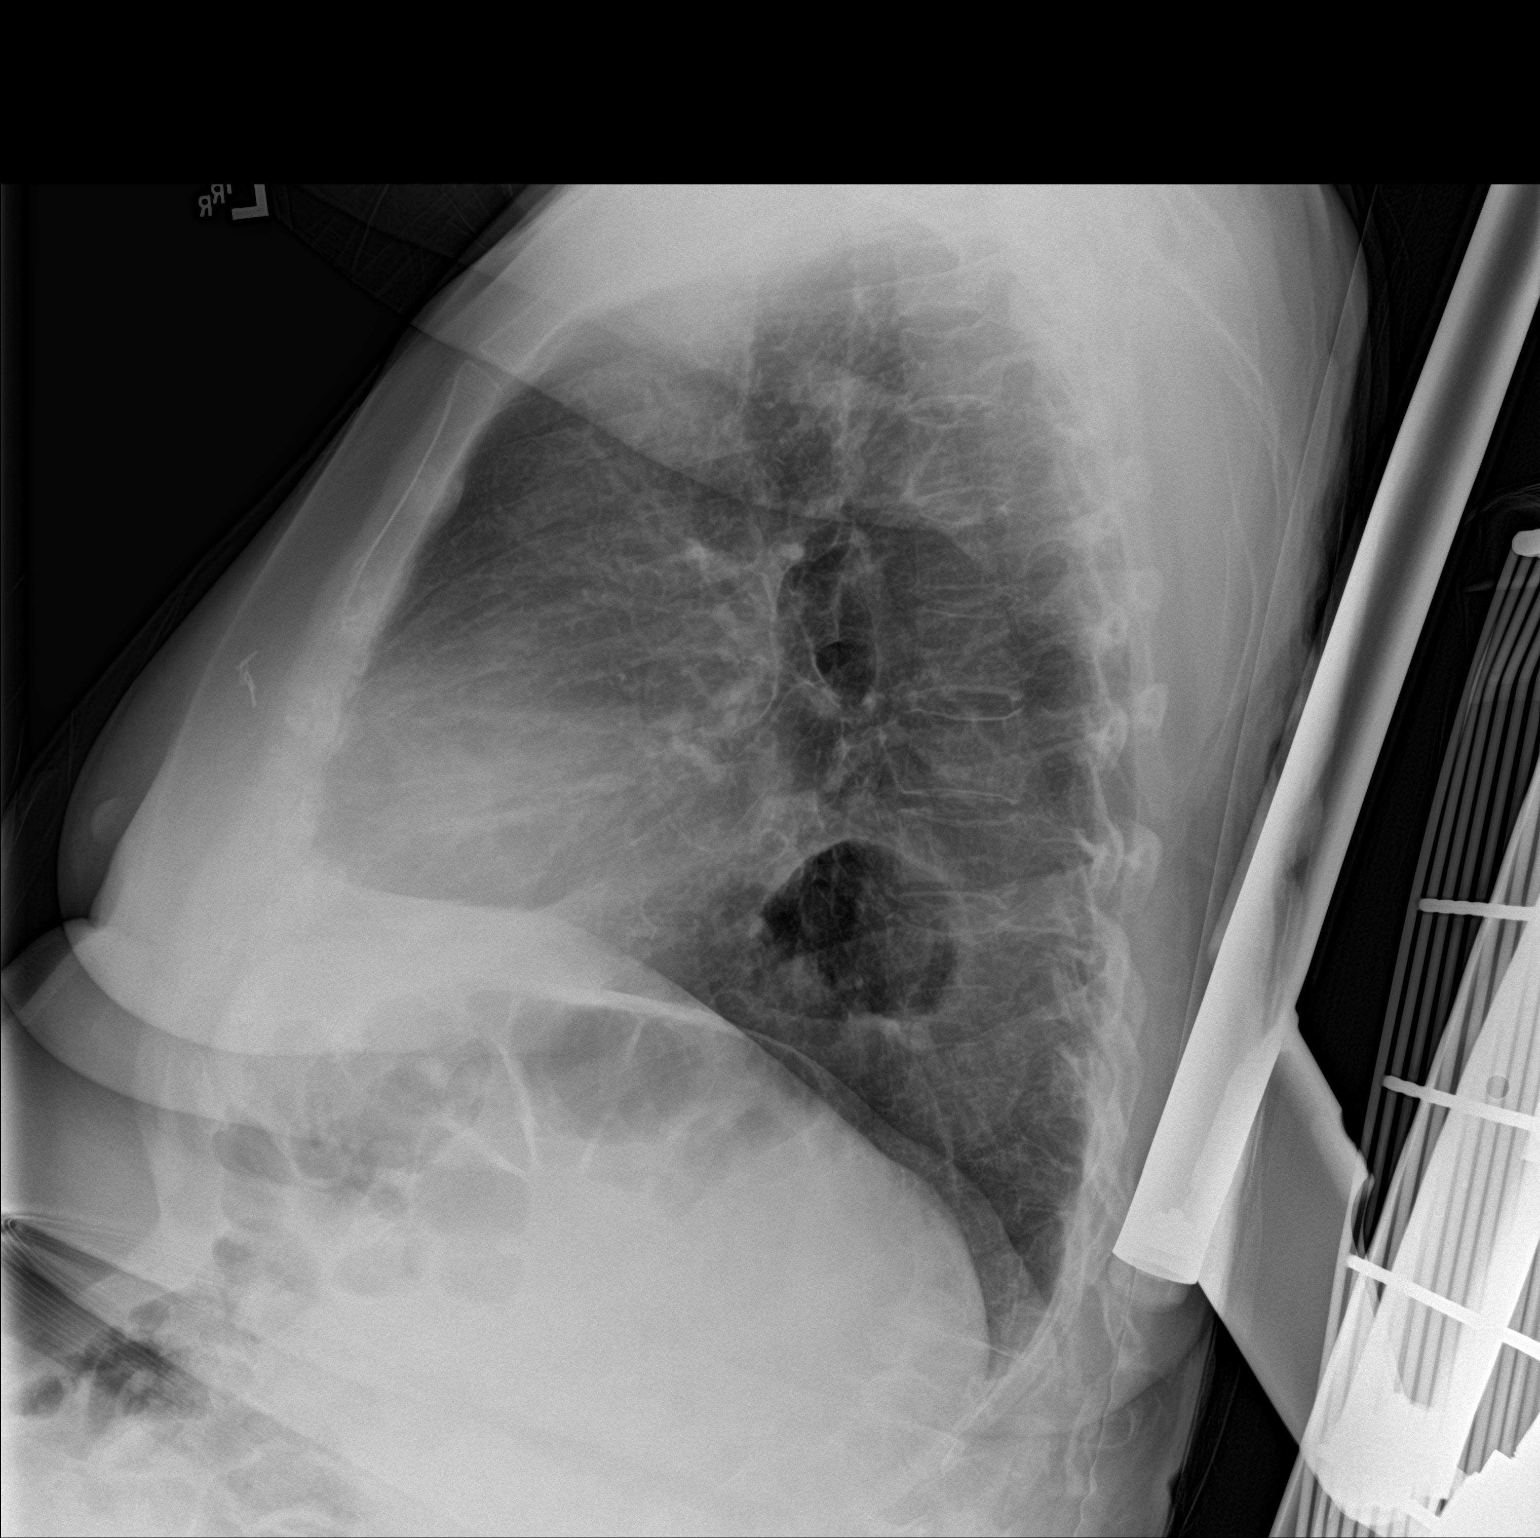
[im 2/2]
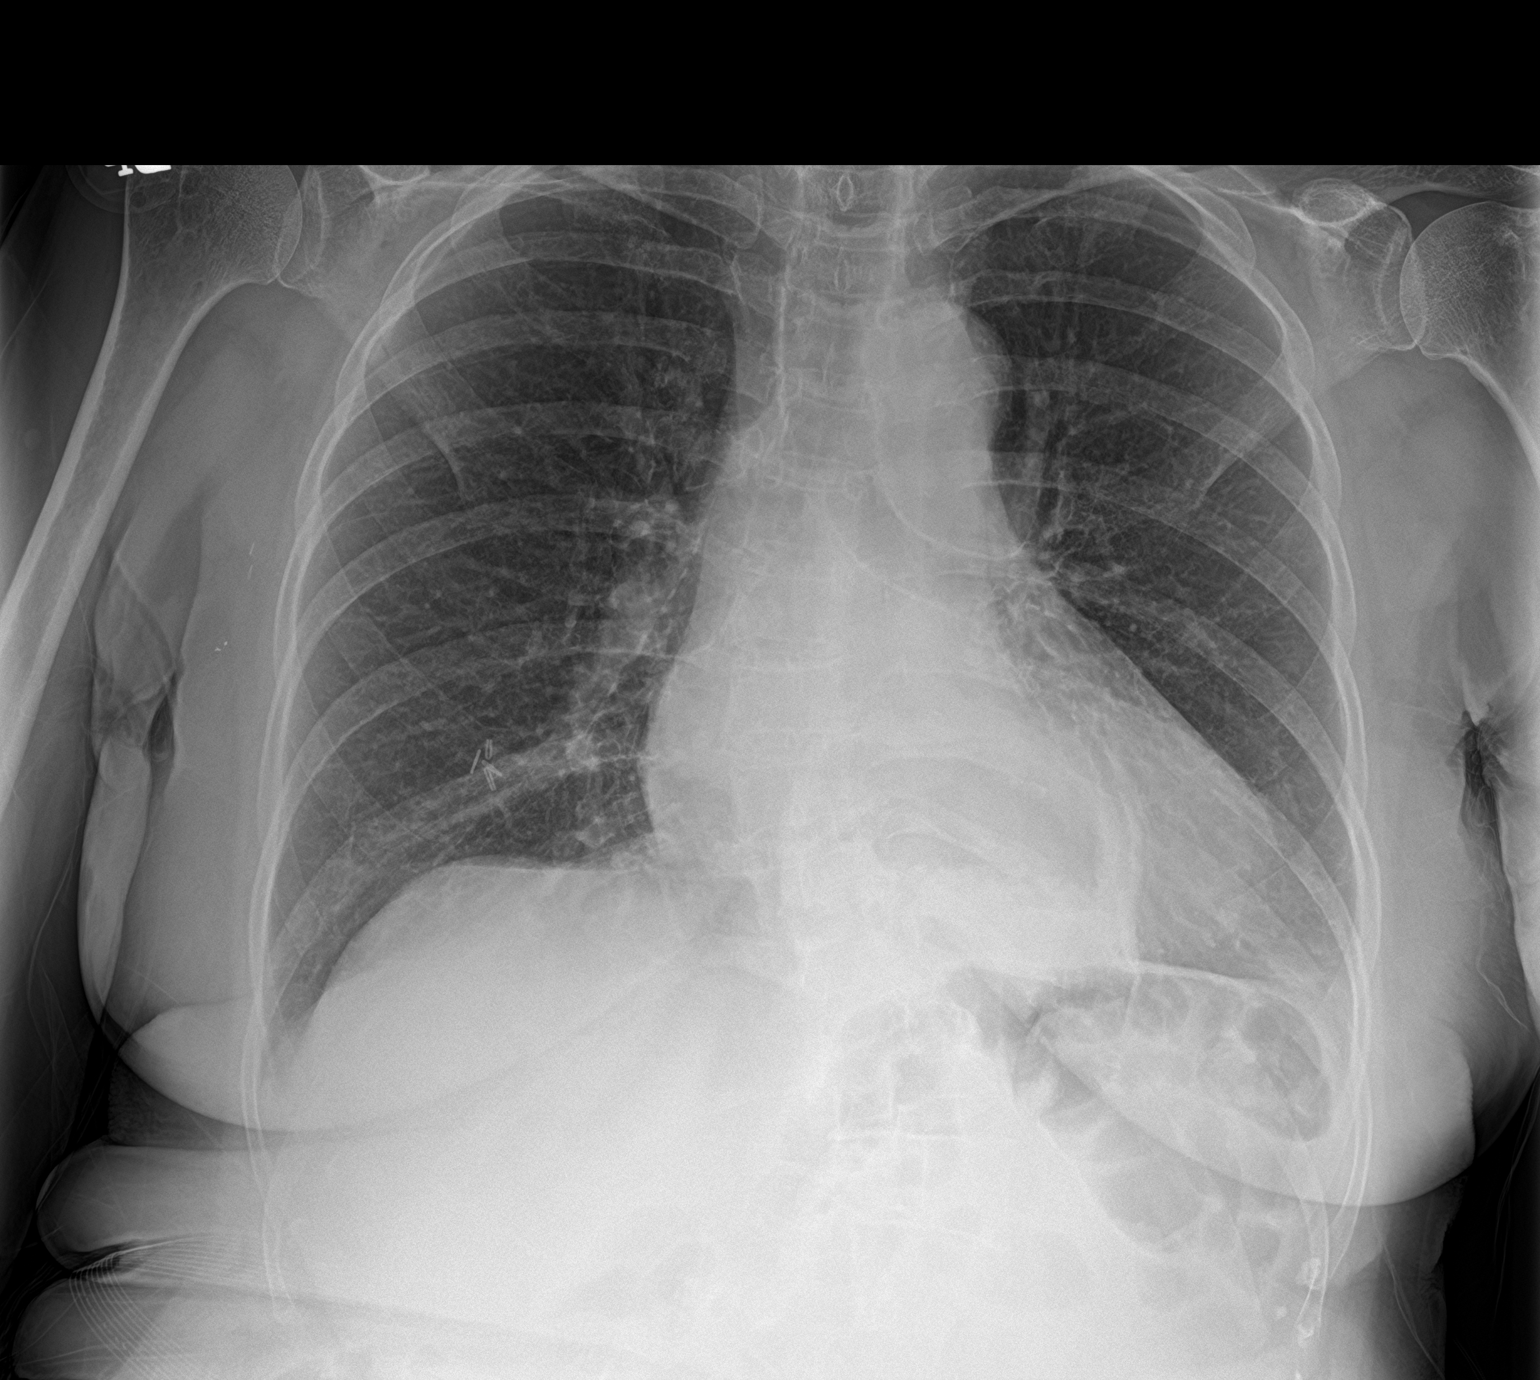

[2 of 2 positions shown; findings below may reference images not displayed]

FINDINGS: There is no focal consolidation, pleural effusion, or pneumothorax.
Stable cardiac silhouette. There is a moderate size hiatal hernia.
No acute osseous pathology. Several surgical clips noted in the
right anterior chest wall.
IMPRESSION: 1. No active cardiopulmonary disease.
2. Hiatal hernia.

## 2021-08-24 ENCOUNTER — Other Ambulatory Visit: Payer: Self-pay | Admitting: Internal Medicine

## 2021-08-25 DIAGNOSIS — R0602 Shortness of breath: Secondary | ICD-10-CM | POA: Diagnosis not present

## 2021-08-25 DIAGNOSIS — I10 Essential (primary) hypertension: Secondary | ICD-10-CM | POA: Diagnosis not present

## 2021-08-25 DIAGNOSIS — I351 Nonrheumatic aortic (valve) insufficiency: Secondary | ICD-10-CM | POA: Diagnosis not present

## 2021-08-25 DIAGNOSIS — Z23 Encounter for immunization: Secondary | ICD-10-CM | POA: Diagnosis not present

## 2021-08-25 DIAGNOSIS — E7800 Pure hypercholesterolemia, unspecified: Secondary | ICD-10-CM | POA: Diagnosis not present

## 2021-08-27 DIAGNOSIS — M5416 Radiculopathy, lumbar region: Secondary | ICD-10-CM | POA: Diagnosis not present

## 2021-08-27 DIAGNOSIS — M48062 Spinal stenosis, lumbar region with neurogenic claudication: Secondary | ICD-10-CM | POA: Diagnosis not present

## 2021-08-31 DIAGNOSIS — Z6828 Body mass index (BMI) 28.0-28.9, adult: Secondary | ICD-10-CM | POA: Diagnosis not present

## 2021-08-31 DIAGNOSIS — K449 Diaphragmatic hernia without obstruction or gangrene: Secondary | ICD-10-CM | POA: Diagnosis not present

## 2021-09-03 DIAGNOSIS — H2513 Age-related nuclear cataract, bilateral: Secondary | ICD-10-CM | POA: Diagnosis not present

## 2021-09-03 DIAGNOSIS — Z0100 Encounter for examination of eyes and vision without abnormal findings: Secondary | ICD-10-CM | POA: Diagnosis not present

## 2021-09-09 ENCOUNTER — Other Ambulatory Visit: Payer: Self-pay | Admitting: Pulmonary Disease

## 2021-09-24 DIAGNOSIS — M5416 Radiculopathy, lumbar region: Secondary | ICD-10-CM | POA: Diagnosis not present

## 2021-09-24 DIAGNOSIS — M5412 Radiculopathy, cervical region: Secondary | ICD-10-CM | POA: Diagnosis not present

## 2021-09-24 DIAGNOSIS — M5030 Other cervical disc degeneration, unspecified cervical region: Secondary | ICD-10-CM | POA: Diagnosis not present

## 2021-09-24 DIAGNOSIS — M48062 Spinal stenosis, lumbar region with neurogenic claudication: Secondary | ICD-10-CM | POA: Diagnosis not present

## 2021-10-12 ENCOUNTER — Ambulatory Visit (INDEPENDENT_AMBULATORY_CARE_PROVIDER_SITE_OTHER): Payer: Medicare HMO

## 2021-10-12 ENCOUNTER — Other Ambulatory Visit: Payer: Self-pay

## 2021-10-12 DIAGNOSIS — Z23 Encounter for immunization: Secondary | ICD-10-CM | POA: Diagnosis not present

## 2021-10-19 ENCOUNTER — Other Ambulatory Visit: Payer: Self-pay | Admitting: Internal Medicine

## 2021-10-27 DIAGNOSIS — G4733 Obstructive sleep apnea (adult) (pediatric): Secondary | ICD-10-CM | POA: Diagnosis not present

## 2021-11-24 ENCOUNTER — Other Ambulatory Visit: Payer: Self-pay | Admitting: Internal Medicine

## 2021-12-07 ENCOUNTER — Other Ambulatory Visit: Payer: Self-pay

## 2021-12-07 ENCOUNTER — Other Ambulatory Visit (INDEPENDENT_AMBULATORY_CARE_PROVIDER_SITE_OTHER): Payer: Medicare HMO

## 2021-12-07 DIAGNOSIS — E7800 Pure hypercholesterolemia, unspecified: Secondary | ICD-10-CM

## 2021-12-07 DIAGNOSIS — R739 Hyperglycemia, unspecified: Secondary | ICD-10-CM | POA: Diagnosis not present

## 2021-12-07 DIAGNOSIS — I10 Essential (primary) hypertension: Secondary | ICD-10-CM

## 2021-12-07 LAB — CBC WITH DIFFERENTIAL/PLATELET
Basophils Absolute: 0 10*3/uL (ref 0.0–0.1)
Basophils Relative: 0.8 % (ref 0.0–3.0)
Eosinophils Absolute: 0.3 10*3/uL (ref 0.0–0.7)
Eosinophils Relative: 4.8 % (ref 0.0–5.0)
HCT: 37.9 % (ref 36.0–46.0)
Hemoglobin: 12.8 g/dL (ref 12.0–15.0)
Lymphocytes Relative: 25.1 % (ref 12.0–46.0)
Lymphs Abs: 1.3 10*3/uL (ref 0.7–4.0)
MCHC: 33.8 g/dL (ref 30.0–36.0)
MCV: 92.4 fl (ref 78.0–100.0)
Monocytes Absolute: 0.5 10*3/uL (ref 0.1–1.0)
Monocytes Relative: 10.1 % (ref 3.0–12.0)
Neutro Abs: 3.1 10*3/uL (ref 1.4–7.7)
Neutrophils Relative %: 59.2 % (ref 43.0–77.0)
Platelets: 224 10*3/uL (ref 150.0–400.0)
RBC: 4.11 Mil/uL (ref 3.87–5.11)
RDW: 14.3 % (ref 11.5–15.5)
WBC: 5.2 10*3/uL (ref 4.0–10.5)

## 2021-12-07 LAB — LIPID PANEL
Cholesterol: 168 mg/dL (ref 0–200)
HDL: 71.4 mg/dL (ref >39.00)
LDL Cholesterol: 79 mg/dL (ref 0–99)
NonHDL: 97.07
Total CHOL/HDL Ratio: 2
Triglycerides: 89 mg/dL (ref 0.0–149.0)
VLDL: 17.8 mg/dL (ref 0.0–40.0)

## 2021-12-07 LAB — HEPATIC FUNCTION PANEL
ALT: 13 U/L (ref 0–35)
AST: 16 U/L (ref 0–37)
Albumin: 4 g/dL (ref 3.5–5.2)
Alkaline Phosphatase: 45 U/L (ref 39–117)
Bilirubin, Direct: 0.1 mg/dL (ref 0.0–0.3)
Total Bilirubin: 0.6 mg/dL (ref 0.2–1.2)
Total Protein: 6.2 g/dL (ref 6.0–8.3)

## 2021-12-07 LAB — BASIC METABOLIC PANEL
BUN: 26 mg/dL — ABNORMAL HIGH (ref 6–23)
CO2: 30 mEq/L (ref 19–32)
Calcium: 9.6 mg/dL (ref 8.4–10.5)
Chloride: 103 mEq/L (ref 96–112)
Creatinine, Ser: 0.92 mg/dL (ref 0.40–1.20)
GFR: 59.92 mL/min — ABNORMAL LOW (ref >60.00)
Glucose, Bld: 92 mg/dL (ref 70–99)
Potassium: 4.1 mEq/L (ref 3.5–5.1)
Sodium: 139 mEq/L (ref 135–145)

## 2021-12-07 LAB — HEMOGLOBIN A1C: Hgb A1c MFr Bld: 6.2 % (ref 4.6–6.5)

## 2021-12-09 ENCOUNTER — Ambulatory Visit (INDEPENDENT_AMBULATORY_CARE_PROVIDER_SITE_OTHER): Payer: Medicare HMO | Admitting: Internal Medicine

## 2021-12-09 ENCOUNTER — Other Ambulatory Visit: Payer: Self-pay

## 2021-12-09 VITALS — BP 138/72 | HR 84 | Temp 97.9°F | Resp 16 | Ht 59.0 in | Wt 143.0 lb

## 2021-12-09 DIAGNOSIS — I10 Essential (primary) hypertension: Secondary | ICD-10-CM

## 2021-12-09 DIAGNOSIS — F439 Reaction to severe stress, unspecified: Secondary | ICD-10-CM | POA: Diagnosis not present

## 2021-12-09 DIAGNOSIS — E7800 Pure hypercholesterolemia, unspecified: Secondary | ICD-10-CM | POA: Diagnosis not present

## 2021-12-09 DIAGNOSIS — K219 Gastro-esophageal reflux disease without esophagitis: Secondary | ICD-10-CM | POA: Diagnosis not present

## 2021-12-09 DIAGNOSIS — K449 Diaphragmatic hernia without obstruction or gangrene: Secondary | ICD-10-CM

## 2021-12-09 DIAGNOSIS — G4733 Obstructive sleep apnea (adult) (pediatric): Secondary | ICD-10-CM

## 2021-12-09 DIAGNOSIS — R739 Hyperglycemia, unspecified: Secondary | ICD-10-CM | POA: Diagnosis not present

## 2021-12-09 DIAGNOSIS — C50919 Malignant neoplasm of unspecified site of unspecified female breast: Secondary | ICD-10-CM | POA: Diagnosis not present

## 2021-12-09 DIAGNOSIS — Z0000 Encounter for general adult medical examination without abnormal findings: Secondary | ICD-10-CM

## 2021-12-09 NOTE — Assessment & Plan Note (Signed)
Physical today 12/09/21.  Colonoscopy 09/29/18 -  Negative.   Mammogram is scheduled at [facility].  Bone density scheduled at [facility].

## 2021-12-09 NOTE — Progress Notes (Signed)
Patient ID: Carla Ryan, female   DOB: September 05, 1946, 75 y.o.   MRN: [id number]   Subjective:    Patient ID: Carla Ryan, female    DOB: Jan 09, 1946, 75 y.o.   MRN: [id number]  This visit occurred during the SARS-CoV-2 public health emergency.  Safety protocols were in place, including screening questions prior to the visit, additional usage of staff PPE, and extensive cleaning of exam room while observing appropriate contact time as indicated for disinfecting solutions.   Patient here for her physical exam.   Chief Complaint  Patient presents with   Annual Exam   .   HPI Has chronic back pain.  Sees Dr Sharlet Salina.  Last neck injection 04/2021 and last back injection 08/2021.  Injections did help some.  Recently evaluated by Dr Dianah Field - GERD and hiatal hernia.  Elected watchful waiting.  Reports some persistent intermittent flares - epigastric pain.  Worse when stomach is empty.  Previous seepage of stool.  On imodium and taking IBgard and probiotics.  Helps.  Sees GI.  No chest pain or sob reported.  No increased cough or congestion.    Past Medical History:  Diagnosis Date   Abdominal hernia    Anxiety    situational stress and anxiety   Cancer ([place]) 2015   breast    Cataract cortical, senile, unspecified laterality    Chest pain    Exertional dyspnea    GERD (gastroesophageal reflux disease)    Hiatal hernia    Hiatal hernia    Hypercholesterolemia    Hyperlipidemia    Hypertension    Osteoarthritis    scoliosis, degenerative disc dz, knee, carpal tunnel   Sleep apnea    Past Surgical History:  Procedure Laterality Date   ABDOMINAL HYSTERECTOMY  07/2000   APPENDECTOMY  2000   APPENDECTOMY     BREAST BIOPSY Bilateral 2014   BREAST SURGERY Right 2015   lumpectomy   CARPAL TUNNEL RELEASE     COLONOSCOPY  2011   [name], [name]: Normal exam   COLONOSCOPY WITH PROPOFOL N/A 04/03/2020   Procedure: COLONOSCOPY WITH PROPOFOL;  Surgeon: Toledo, Benay Pike, MD;   Location: [facility];  Service: Gastroenterology;  Laterality: N/A;   ESOPHAGOGASTRODUODENOSCOPY (EGD) WITH PROPOFOL N/A 04/03/2020   Procedure: ESOPHAGOGASTRODUODENOSCOPY (EGD) WITH PROPOFOL;  Surgeon: Toledo, Benay Pike, MD;  Location: [facility];  Service: Gastroenterology;  Laterality: N/A;   HERNIA REPAIR  02/10/15   Laparoscopic Repair of Lateral Abdominal Wall Hernia  02/10/15   MASTECTOMY     UPPER GI ENDOSCOPY  05/10/2014   [name], [name] Bile gastritis, medium hiatal hernia. Incomplete visualization of the cardia and fundus.   Family History  Problem Relation Age of Onset   Diabetes Mother    Multiple myeloma Mother    Diabetes Brother    Diabetes Sister        x2   Hypertension Brother    Multiple myeloma Father    Social History   Socioeconomic History   Marital status: Married    Spouse name: Not on file   Number of children: 2   Years of education: Not on file   Highest education level: Not on file  Occupational History   Not on file  Tobacco Use   Smoking status: Never   Smokeless tobacco: Never  Vaping Use   Vaping Use: Never used  Substance and Sexual Activity   Alcohol use: Yes    Alcohol/week: 0.0 standard drinks  Comment: wine occasionally   Drug use: No   Sexual activity: Yes  Other Topics Concern   Not on file  Social History Narrative   Not on file   Social Determinants of Health   Financial Resource Strain: Not on file  Food Insecurity: Not on file  Transportation Needs: Not on file  Physical Activity: Not on file  Stress: Not on file  Social Connections: Not on file    Review of Systems  Constitutional:  Negative for appetite change and unexpected weight change.  HENT:  Negative for congestion, sinus pressure and sore throat.   Eyes:  Negative for pain and visual disturbance.  Respiratory:  Negative for cough, chest tightness and shortness of breath.   Cardiovascular:  Negative for chest pain, palpitations and leg  swelling.  Gastrointestinal:  Negative for diarrhea, nausea and vomiting.       Intermittent flares epigastric pain as outlined.    Genitourinary:  Negative for difficulty urinating and dysuria.  Musculoskeletal:  Negative for joint swelling and myalgias.  Skin:  Negative for color change and rash.  Neurological:  Negative for dizziness, light-headedness and headaches.  Hematological:  Negative for adenopathy. Does not bruise/bleed easily.  Psychiatric/Behavioral:  Negative for agitation and dysphoric mood.       Objective:     BP 138/72    Pulse 84    Temp 97.9 F (36.6 C)    Resp 16    Ht 4' 11"  (1.499 m)    Wt 143 lb (64.9 kg)    SpO2 97%    BMI 28.88 kg/m  Wt Readings from Last 3 Encounters:  12/09/21 143 lb (64.9 kg)  08/05/21 143 lb (64.9 kg)  04/08/21 142 lb (64.4 kg)    Physical Exam Vitals reviewed.  Constitutional:      General: She is not in acute distress.    Appearance: Normal appearance. She is well-developed.  HENT:     Head: Normocephalic and atraumatic.     Right Ear: External ear normal.     Left Ear: External ear normal.  Eyes:     General: No scleral icterus.       Right eye: No discharge.        Left eye: No discharge.     Conjunctiva/sclera: Conjunctivae normal.  Neck:     Thyroid: No thyromegaly.  Cardiovascular:     Rate and Rhythm: Normal rate and regular rhythm.  Pulmonary:     Effort: No tachypnea, accessory muscle usage or respiratory distress.     Breath sounds: Normal breath sounds. No decreased breath sounds or wheezing.  Chest:  Breasts:    Right: No inverted nipple, mass, nipple discharge or tenderness (no axillary adenopathy).     Left: No inverted nipple, mass, nipple discharge or tenderness (no axilarry adenopathy).  Abdominal:     General: Bowel sounds are normal.     Palpations: Abdomen is soft.     Tenderness: There is no abdominal tenderness.  Musculoskeletal:        General: No swelling or tenderness.     Cervical back:  Neck supple.  Lymphadenopathy:     Cervical: No cervical adenopathy.  Skin:    Findings: No erythema or rash.  Neurological:     Mental Status: She is alert and oriented to person, place, and time.  Psychiatric:        Mood and Affect: Mood normal.        Behavior: Behavior normal.  Outpatient Encounter Medications as of 12/09/2021  Medication Sig   anastrozole (ARIMIDEX) 1 MG tablet TAKE 1 TABLET BY MOUTH ONCE DAILY   aspirin 81 MG tablet Take 81 mg by mouth daily.   azelastine (ASTELIN) 0.1 % nasal spray USE 2 SPRAYS IN EACH       NOSTRIL TWICE DAILY AS     DIRECTED   calcium citrate-vitamin D 500-400 MG-UNIT chewable tablet Chew 1 tablet by mouth 2 (two) times daily.   cholecalciferol (VITAMIN D) 1000 UNITS tablet Take 2,000 Units by mouth in the morning and at bedtime.    fluticasone (FLONASE) 50 MCG/ACT nasal spray PLACE 2 SPRAYS INTO BOTH NOSTRILS DAILY.   gabapentin (NEURONTIN) 100 MG capsule TAKE 2 CAPSULES AT BEDTIME   irbesartan (AVAPRO) 75 MG tablet TAKE 1 TABLET EVERY DAY   levocetirizine (XYZAL) 5 MG tablet TAKE 1 TABLET EVERY EVENING   methocarbamol (ROBAXIN) 500 MG tablet Take 1 tablet (500 mg total) by mouth at bedtime as needed for muscle spasms.   Misc Natural Products (TART CHERRY ADVANCED) CAPS Take 1 capsule by mouth 2 (two) times daily. Reported on 03/04/2016   MULTIPLE MINERALS-VITAMINS PO    Multiple Vitamin (MULTIVITAMIN) tablet Take 1 tablet by mouth daily.   nystatin cream (MYCOSTATIN) Apply 1 application topically 2 (two) times daily.   PEPPERMINT OIL PO Take by mouth.   RABEprazole (ACIPHEX) 20 MG tablet Take 1 tablet (20 mg total) by mouth 2 (two) times daily.   rosuvastatin (CRESTOR) 5 MG tablet TAKE 1 TABLET (5 MG TOTAL) BY MOUTH 4 (FOUR) TIMES A WEEK.   sucralfate (CARAFATE) 1 g tablet Take 1 tablet (1 g total) by mouth 2 (two) times daily. (Patient taking differently: Take 1 g by mouth in the morning, at noon, and at bedtime.)    triamterene-hydrochlorothiazide (MAXZIDE-25) 37.5-25 MG tablet TAKE 1/2 TABLET EVERY DAY   No facility-administered encounter medications on file as of 12/09/2021.     Lab Results  Component Value Date   WBC 5.2 12/07/2021   HGB 12.8 12/07/2021   HCT 37.9 12/07/2021   PLT 224.0 12/07/2021   GLUCOSE 92 12/07/2021   CHOL 168 12/07/2021   TRIG 89.0 12/07/2021   HDL 71.40 12/07/2021   LDLDIRECT 146.9 01/31/2014   LDLCALC 79 12/07/2021   ALT 13 12/07/2021   AST 16 12/07/2021   NA 139 12/07/2021   K 4.1 12/07/2021   CL 103 12/07/2021   CREATININE 0.92 12/07/2021   BUN 26 (H) 12/07/2021   CO2 30 12/07/2021   TSH 2.36 11/25/2020   HGBA1C 6.2 12/07/2021    MR LUMBAR SPINE WO CONTRAST  Result Date: 06/24/2021 CLINICAL DATA:  Low back pain radiating to the left hip and lower extremity EXAM: MRI LUMBAR SPINE WITHOUT CONTRAST TECHNIQUE: Multiplanar, multisequence MR imaging of the lumbar spine was performed. No intravenous contrast was administered. COMPARISON:  None. FINDINGS: Segmentation:  Standard. Alignment:  Levoscoliosis with apex at L1. Vertebrae:  No fracture, evidence of discitis, or bone lesion. Conus medullaris and cauda equina: Conus extends to the L2 level. Conus and cauda equina appear normal. Paraspinal and other soft tissues: Negative Disc levels: T12-L1: Small disc bulge.  Mild right foraminal stenosis. L1-L2: Right asymmetric disc space narrowing with endplate spurring. No spinal canal stenosis. Mild right neural foraminal stenosis. L2-L3: Moderate facet hypertrophy. Small disc bulge. No spinal canal stenosis. Moderate left neural foraminal stenosis. L3-L4: Left asymmetric disc bulge. Mild facet hypertrophy. No spinal canal stenosis. Mild left neural foraminal stenosis.  L4-L5: Moderate facet hypertrophy. Left asymmetric disc bulge. Left lateral recess narrowing without central spinal canal stenosis. No neural foraminal stenosis. L5-S1: Normal disc space and facet joints. No spinal  canal stenosis. No neural foraminal stenosis. Visualized sacrum: Normal. IMPRESSION: 1. Levoscoliosis with apex at L1. 2. Moderate left L2-3 neural foraminal stenosis. 3. Mild right T12-L1, right L1-L2, left L3-L4 and left L4-L5 neural foraminal stenosis. 4. No central spinal canal stenosis. Electronically Signed   By: Ulyses Jarred [name]   On: 06/24/2021 01:48       Assessment & Plan:   Problem List Items Addressed This Visit     Breast cancer ([place])    Continues on arimidex.  Followed by oncology.       GERD (gastroesophageal reflux disease)    Has a large paraesophageal hernia.  Symptoms as outlined.  On aciphex and carafate.  Has seen GI.  Recently evaluated by Dr Dianah Field.  Elected watchful waiting.        Health care maintenance    Physical today 12/09/21.  Colonoscopy 09/29/18 -  Negative.   Mammogram is scheduled at [facility].  Bone density scheduled at [facility].       Hypercholesterolemia    Continue crestor.  Low cholesterol diet and exercise.  Follow lipid panel and liver function tests.        Relevant Orders   Lipid panel   Hepatic function panel   Hyperglycemia    Low carb diet and exercise.  Follow met b and a1c.        Relevant Orders   Hemoglobin A1c   Hypertension    Continue avapro and triam/hctz.  Blood pressures as outlined.  Follow pressures.  Follow metabolic panel.       Relevant Orders   Basic metabolic panel   TSH   Obstructive sleep apnea    Using cpap.       Paraesophageal hiatal hernia    Just saw Dr Dianah Field.  Elected watchful waiting.       Stress    Overall appears to be handling things relatively well.  Follow.       Other Visit Diagnoses     Routine general medical examination at a health care facility    -  Primary        Einar Pheasant, MD

## 2021-12-15 ENCOUNTER — Encounter: Payer: Self-pay | Admitting: Internal Medicine

## 2021-12-15 DIAGNOSIS — M4802 Spinal stenosis, cervical region: Secondary | ICD-10-CM | POA: Diagnosis not present

## 2021-12-15 DIAGNOSIS — M5412 Radiculopathy, cervical region: Secondary | ICD-10-CM | POA: Diagnosis not present

## 2021-12-15 NOTE — Assessment & Plan Note (Signed)
Continues on arimidex.  Followed by oncology.

## 2021-12-15 NOTE — Assessment & Plan Note (Signed)
Overall appears to be handling things relatively well.  Follow.   

## 2021-12-15 NOTE — Assessment & Plan Note (Signed)
Just saw Dr Dianah Field.  Elected watchful waiting.

## 2021-12-15 NOTE — Assessment & Plan Note (Signed)
Using cpap

## 2021-12-15 NOTE — Assessment & Plan Note (Signed)
Continue avapro and triam/hctz.  Blood pressures as outlined.  Follow pressures.  Follow metabolic panel.  

## 2021-12-15 NOTE — Assessment & Plan Note (Signed)
Has a large paraesophageal hernia.  Symptoms as outlined.  On aciphex and carafate.  Has seen GI.  Recently evaluated by Dr Dianah Field.  Elected watchful waiting.

## 2021-12-15 NOTE — Assessment & Plan Note (Signed)
Low carb diet and exercise.  Follow met b and a1c.

## 2021-12-15 NOTE — Assessment & Plan Note (Signed)
Continue crestor.  Low cholesterol diet and exercise. Follow lipid panel and liver function tests.   

## 2021-12-16 DIAGNOSIS — K449 Diaphragmatic hernia without obstruction or gangrene: Secondary | ICD-10-CM | POA: Diagnosis not present

## 2021-12-16 DIAGNOSIS — R151 Fecal smearing: Secondary | ICD-10-CM | POA: Diagnosis not present

## 2022-01-05 DIAGNOSIS — C50911 Malignant neoplasm of unspecified site of right female breast: Secondary | ICD-10-CM | POA: Diagnosis not present

## 2022-01-05 DIAGNOSIS — M8589 Other specified disorders of bone density and structure, multiple sites: Secondary | ICD-10-CM | POA: Diagnosis not present

## 2022-01-05 LAB — HM DEXA SCAN

## 2022-01-25 DIAGNOSIS — M48062 Spinal stenosis, lumbar region with neurogenic claudication: Secondary | ICD-10-CM | POA: Diagnosis not present

## 2022-01-25 DIAGNOSIS — M4802 Spinal stenosis, cervical region: Secondary | ICD-10-CM | POA: Diagnosis not present

## 2022-01-25 DIAGNOSIS — M5416 Radiculopathy, lumbar region: Secondary | ICD-10-CM | POA: Diagnosis not present

## 2022-01-25 DIAGNOSIS — M5412 Radiculopathy, cervical region: Secondary | ICD-10-CM | POA: Diagnosis not present

## 2022-02-10 ENCOUNTER — Other Ambulatory Visit: Payer: Self-pay | Admitting: Internal Medicine

## 2022-02-22 DIAGNOSIS — Z170 Estrogen receptor positive status [ER+]: Secondary | ICD-10-CM | POA: Diagnosis not present

## 2022-02-22 DIAGNOSIS — M8580 Other specified disorders of bone density and structure, unspecified site: Secondary | ICD-10-CM | POA: Diagnosis not present

## 2022-02-22 DIAGNOSIS — C50211 Malignant neoplasm of upper-inner quadrant of right female breast: Secondary | ICD-10-CM | POA: Diagnosis not present

## 2022-02-22 DIAGNOSIS — K449 Diaphragmatic hernia without obstruction or gangrene: Secondary | ICD-10-CM | POA: Diagnosis not present

## 2022-02-22 DIAGNOSIS — M545 Low back pain, unspecified: Secondary | ICD-10-CM | POA: Diagnosis not present

## 2022-02-22 DIAGNOSIS — C50911 Malignant neoplasm of unspecified site of right female breast: Secondary | ICD-10-CM | POA: Diagnosis not present

## 2022-02-22 DIAGNOSIS — Z1231 Encounter for screening mammogram for malignant neoplasm of breast: Secondary | ICD-10-CM | POA: Diagnosis not present

## 2022-02-22 DIAGNOSIS — G8929 Other chronic pain: Secondary | ICD-10-CM | POA: Diagnosis not present

## 2022-02-22 LAB — HM MAMMOGRAPHY

## 2022-02-24 DIAGNOSIS — I351 Nonrheumatic aortic (valve) insufficiency: Secondary | ICD-10-CM | POA: Diagnosis not present

## 2022-02-24 DIAGNOSIS — I10 Essential (primary) hypertension: Secondary | ICD-10-CM | POA: Diagnosis not present

## 2022-02-24 DIAGNOSIS — E7800 Pure hypercholesterolemia, unspecified: Secondary | ICD-10-CM | POA: Diagnosis not present

## 2022-02-24 DIAGNOSIS — R0602 Shortness of breath: Secondary | ICD-10-CM | POA: Diagnosis not present

## 2022-02-24 DIAGNOSIS — G4733 Obstructive sleep apnea (adult) (pediatric): Secondary | ICD-10-CM | POA: Diagnosis not present

## 2022-03-18 DIAGNOSIS — M5416 Radiculopathy, lumbar region: Secondary | ICD-10-CM | POA: Diagnosis not present

## 2022-03-18 DIAGNOSIS — M48062 Spinal stenosis, lumbar region with neurogenic claudication: Secondary | ICD-10-CM | POA: Diagnosis not present

## 2022-03-19 ENCOUNTER — Other Ambulatory Visit: Payer: Self-pay | Admitting: Internal Medicine

## 2022-03-31 ENCOUNTER — Other Ambulatory Visit: Payer: Self-pay | Admitting: Internal Medicine

## 2022-04-07 ENCOUNTER — Other Ambulatory Visit (INDEPENDENT_AMBULATORY_CARE_PROVIDER_SITE_OTHER): Payer: Medicare HMO

## 2022-04-07 DIAGNOSIS — I10 Essential (primary) hypertension: Secondary | ICD-10-CM | POA: Diagnosis not present

## 2022-04-07 DIAGNOSIS — R739 Hyperglycemia, unspecified: Secondary | ICD-10-CM | POA: Diagnosis not present

## 2022-04-07 DIAGNOSIS — E7800 Pure hypercholesterolemia, unspecified: Secondary | ICD-10-CM | POA: Diagnosis not present

## 2022-04-07 LAB — BASIC METABOLIC PANEL
BUN: 20 mg/dL (ref 6–23)
CO2: 28 mEq/L (ref 19–32)
Calcium: 9.3 mg/dL (ref 8.4–10.5)
Chloride: 101 mEq/L (ref 96–112)
Creatinine, Ser: 0.85 mg/dL (ref 0.40–1.20)
GFR: 65.74 mL/min (ref >60.00)
Glucose, Bld: 89 mg/dL (ref 70–99)
Potassium: 4 mEq/L (ref 3.5–5.1)
Sodium: 136 mEq/L (ref 135–145)

## 2022-04-07 LAB — HEPATIC FUNCTION PANEL
ALT: 16 U/L (ref 0–35)
AST: 16 U/L (ref 0–37)
Albumin: 4.1 g/dL (ref 3.5–5.2)
Alkaline Phosphatase: 47 U/L (ref 39–117)
Bilirubin, Direct: 0.1 mg/dL (ref 0.0–0.3)
Total Bilirubin: 0.6 mg/dL (ref 0.2–1.2)
Total Protein: 6.1 g/dL (ref 6.0–8.3)

## 2022-04-07 LAB — LIPID PANEL
Cholesterol: 166 mg/dL (ref 0–200)
HDL: 65.4 mg/dL (ref >39.00)
LDL Cholesterol: 79 mg/dL (ref 0–99)
NonHDL: 100.3
Total CHOL/HDL Ratio: 3
Triglycerides: 109 mg/dL (ref 0.0–149.0)
VLDL: 21.8 mg/dL (ref 0.0–40.0)

## 2022-04-07 LAB — HEMOGLOBIN A1C: Hgb A1c MFr Bld: 6.2 % (ref 4.6–6.5)

## 2022-04-07 LAB — TSH: TSH: 1.77 u[IU]/mL (ref 0.35–5.50)

## 2022-04-09 ENCOUNTER — Encounter: Payer: Self-pay | Admitting: Internal Medicine

## 2022-04-09 ENCOUNTER — Ambulatory Visit (INDEPENDENT_AMBULATORY_CARE_PROVIDER_SITE_OTHER): Payer: Medicare HMO | Admitting: Internal Medicine

## 2022-04-09 DIAGNOSIS — F439 Reaction to severe stress, unspecified: Secondary | ICD-10-CM

## 2022-04-09 DIAGNOSIS — E7800 Pure hypercholesterolemia, unspecified: Secondary | ICD-10-CM

## 2022-04-09 DIAGNOSIS — G4733 Obstructive sleep apnea (adult) (pediatric): Secondary | ICD-10-CM | POA: Diagnosis not present

## 2022-04-09 DIAGNOSIS — K219 Gastro-esophageal reflux disease without esophagitis: Secondary | ICD-10-CM

## 2022-04-09 DIAGNOSIS — L989 Disorder of the skin and subcutaneous tissue, unspecified: Secondary | ICD-10-CM

## 2022-04-09 DIAGNOSIS — R739 Hyperglycemia, unspecified: Secondary | ICD-10-CM | POA: Diagnosis not present

## 2022-04-09 DIAGNOSIS — M545 Low back pain, unspecified: Secondary | ICD-10-CM | POA: Diagnosis not present

## 2022-04-09 DIAGNOSIS — I10 Essential (primary) hypertension: Secondary | ICD-10-CM

## 2022-04-09 DIAGNOSIS — K449 Diaphragmatic hernia without obstruction or gangrene: Secondary | ICD-10-CM

## 2022-04-09 NOTE — Progress Notes (Addendum)
Patient ID: Carla Ryan, female   DOB: Jan 27, 1946, 76 y.o.   MRN: [id number]   Subjective:    Patient ID: Carla Ryan, female    DOB: November 02, 1946, 76 y.o.   MRN: [id number]  This visit occurred during the SARS-CoV-2 public health emergency.  Safety protocols were in place, including screening questions prior to the visit, additional usage of staff PPE, and extensive cleaning of exam room while observing appropriate contact time as indicated for disinfecting solutions.   Patient here for a scheduled follow up.   Chief Complaint  Patient presents with   Follow-up    F/u 31mo - HTN   .   HPI Persistent back pain.  Remains active.  No chest pain reported.  Breathing stable.  No abdominal pain.  Lesion on back.  Sees Dr GPhillip Heal  Blood pressures averaging 120-130/60-70.  Seeing Dr CSharlet Salina  S/p recent back injection.  Has f/u planned next week for her shoulder.  Off arimidex.  Completed 7 years.     Past Medical History:  Diagnosis Date   Abdominal hernia    Anxiety    situational stress and anxiety   Cancer ([place]   breast    Cataract cortical, senile, unspecified laterality    Chest pain    Exertional dyspnea    GERD (gastroesophageal reflux disease)    Hiatal hernia    Hiatal hernia    Hypercholesterolemia    Hyperlipidemia    Hypertension    Osteoarthritis    scoliosis, degenerative disc dz, knee, carpal tunnel   Sleep apnea    Past Surgical History:  Procedure Laterality Date   ABDOMINAL HYSTERECTOMY  07/2000   APPENDECTOMY  2000   APPENDECTOMY     BREAST BIOPSY Bilateral 2014   BREAST SURGERY Right 2015   lumpectomy   CARPAL TUNNEL RELEASE     COLONOSCOPY  2011   MLoistine Simas [name]: Normal exam   COLONOSCOPY WITH PROPOFOL N/A 04/03/2020   Procedure: COLONOSCOPY WITH PROPOFOL;  Surgeon: Toledo, TBenay Pike MD;  Location: [facility];  Service: Gastroenterology;  Laterality: N/A;   ESOPHAGOGASTRODUODENOSCOPY (EGD) WITH PROPOFOL N/A 04/03/2020    Procedure: ESOPHAGOGASTRODUODENOSCOPY (EGD) WITH PROPOFOL;  Surgeon: Toledo, TBenay Pike MD;  Location: [facility];  Service: Gastroenterology;  Laterality: N/A;   HERNIA REPAIR  02/10/15   Laparoscopic Repair of Lateral Abdominal Wall Hernia  02/10/15   MASTECTOMY     UPPER GI ENDOSCOPY  05/10/2014   MLoistine Simas [name] Bile gastritis, medium hiatal hernia. Incomplete visualization of the cardia and fundus.   Family History  Problem Relation Age of Onset   Diabetes Mother    Multiple myeloma Mother    Multiple myeloma Father    Diabetes Sister        x2   Diabetes Brother    Hypertension Brother    Dementia Brother    Social History   Socioeconomic History   Marital status: Married    Spouse name: Not on file   Number of children: 2   Years of education: Not on file   Highest education level: Not on file  Occupational History   Not on file  Tobacco Use   Smoking status: Never   Smokeless tobacco: Never  Vaping Use   Vaping Use: Never used  Substance and Sexual Activity   Alcohol use: Yes    Alcohol/week: 0.0 standard drinks of alcohol    Comment: wine occasionally   Drug use: No   Sexual activity: Yes  Other Topics Concern   Not on file  Social History Narrative   Not on file   Social Determinants of Health   Financial Resource Strain: Low Risk  (06/09/2022)   Overall Financial Resource Strain (CARDIA)    Difficulty of Paying Living Expenses: Not hard at all  Food Insecurity: No Food Insecurity (06/09/2022)   Hunger Vital Sign    Worried About Running Out of Food in the Last Year: Never true    Ran Out of Food in the Last Year: Never true  Transportation Needs: No Transportation Needs (06/09/2022)   PRAPARE - Hydrologist (Medical): No    Lack of Transportation (Non-Medical): No  Physical Activity: Insufficiently Active (06/09/2022)   Exercise Vital Sign    Days of Exercise per Week: 3 days    Minutes of Exercise per Session: 30  min  Stress: No Stress Concern Present (06/09/2022)   Spray    Feeling of Stress : Not at all  Social Connections: Unknown (06/09/2022)   Social Connection and Isolation Panel [NHANES]    Frequency of Communication with Friends and Family: Not on file    Frequency of Social Gatherings with Friends and Family: Not on file    Attends Religious Services: Not on file    Active Member of Clubs or Organizations: Not on file    Attends Archivist Meetings: Not on file    Marital Status: Married     Review of Systems  Constitutional:  Negative for appetite change and unexpected weight change.  HENT:  Negative for congestion and sinus pressure.   Respiratory:  Negative for cough, chest tightness and shortness of breath.   Cardiovascular:  Negative for chest pain, palpitations and leg swelling.  Gastrointestinal:  Negative for abdominal pain, diarrhea, nausea and vomiting.  Genitourinary:  Negative for difficulty urinating and dysuria.  Musculoskeletal:  Positive for back pain. Negative for joint swelling.  Skin:  Negative for color change and rash.  Neurological:  Negative for dizziness, light-headedness and headaches.  Psychiatric/Behavioral:  Negative for agitation and dysphoric mood.        Objective:     BP 112/62 (BP Location: Left Arm, Patient Position: Sitting, Cuff Size: Small)   Pulse 60   Temp 98.1 F (36.7 C) (Temporal)   Resp 14   Ht 5' (1.524 m)   Wt 146 lb (66.2 kg)   SpO2 95%   BMI 28.51 kg/m  Wt Readings from Last 3 Encounters:  06/09/22 146 lb (66.2 kg)  04/09/22 146 lb (66.2 kg)  12/09/21 143 lb (64.9 kg)    Physical Exam Vitals reviewed.  Constitutional:      General: She is not in acute distress.    Appearance: Normal appearance.  HENT:     Head: Normocephalic and atraumatic.     Right Ear: External ear normal.     Left Ear: External ear normal.  Eyes:     General: No  scleral icterus.       Right eye: No discharge.        Left eye: No discharge.     Conjunctiva/sclera: Conjunctivae normal.  Neck:     Thyroid: No thyromegaly.  Cardiovascular:     Rate and Rhythm: Normal rate and regular rhythm.  Pulmonary:     Effort: No respiratory distress.     Breath sounds: Normal breath sounds. No wheezing.  Abdominal:     General: Bowel  sounds are normal.     Palpations: Abdomen is soft.     Tenderness: There is no abdominal tenderness.  Musculoskeletal:        General: No swelling or tenderness.     Cervical back: Neck supple. No tenderness.  Lymphadenopathy:     Cervical: No cervical adenopathy.  Skin:    Findings: No erythema or rash.  Neurological:     Mental Status: She is alert.  Psychiatric:        Mood and Affect: Mood normal.        Behavior: Behavior normal.      Outpatient Encounter Medications as of 04/09/2022  Medication Sig   aspirin 81 MG tablet Take 81 mg by mouth daily.   calcium citrate-vitamin D 500-400 MG-UNIT chewable tablet Chew 1 tablet by mouth 2 (two) times daily.   cholecalciferol (VITAMIN D) 1000 UNITS tablet Take 2,000 Units by mouth in the morning and at bedtime.    fluticasone (FLONASE) 50 MCG/ACT nasal spray PLACE 2 SPRAYS INTO BOTH NOSTRILS DAILY.   gabapentin (NEURONTIN) 100 MG capsule TAKE 2 CAPSULES AT BEDTIME   levocetirizine (XYZAL) 5 MG tablet TAKE 1 TABLET EVERY EVENING   methocarbamol (ROBAXIN) 500 MG tablet Take 1 tablet (500 mg total) by mouth at bedtime as needed for muscle spasms.   Misc Natural Products (TART CHERRY ADVANCED) CAPS Take 1 capsule by mouth 2 (two) times daily. Reported on 03/04/2016   MULTIPLE MINERALS-VITAMINS PO    Multiple Vitamin (MULTIVITAMIN) tablet Take 1 tablet by mouth daily.   nystatin cream (MYCOSTATIN) Apply 1 application topically 2 (two) times daily.   PEPPERMINT OIL PO Take by mouth.   RABEprazole (ACIPHEX) 20 MG tablet Take 1 tablet (20 mg total) by mouth 2 (two) times  daily.   rosuvastatin (CRESTOR) 5 MG tablet TAKE 1 TABLET (5 MG TOTAL) BY MOUTH 4 (FOUR) TIMES A WEEK.   sucralfate (CARAFATE) 1 g tablet Take 1 tablet (1 g total) by mouth 2 (two) times daily. (Patient taking differently: Take 1 g by mouth in the morning, at noon, and at bedtime.)   triamterene-hydrochlorothiazide (MAXZIDE-25) 37.5-25 MG tablet TAKE 1/2 TABLET EVERY DAY   [DISCONTINUED] Azelastine HCl 137 MCG/SPRAY SOLN SPRAY TWO SPRAYS IN EACH NOSTRIL TWICE DAILY AS DIRECTED   [DISCONTINUED] irbesartan (AVAPRO) 75 MG tablet TAKE 1 TABLET EVERY DAY   [DISCONTINUED] anastrozole (ARIMIDEX) 1 MG tablet TAKE 1 TABLET BY MOUTH ONCE DAILY (Patient not taking: Reported on 04/09/2022)   No facility-administered encounter medications on file as of 04/09/2022.     Lab Results  Component Value Date   WBC 5.2 12/07/2021   HGB 12.8 12/07/2021   HCT 37.9 12/07/2021   PLT 224.0 12/07/2021   GLUCOSE 89 04/07/2022   CHOL 166 04/07/2022   TRIG 109.0 04/07/2022   HDL 65.40 04/07/2022   LDLDIRECT 146.9 01/31/2014   LDLCALC 79 04/07/2022   ALT 16 04/07/2022   AST 16 04/07/2022   NA 136 04/07/2022   K 4.0 04/07/2022   CL 101 04/07/2022   CREATININE 0.85 04/07/2022   BUN 20 04/07/2022   CO2 28 04/07/2022   TSH 1.77 04/07/2022   HGBA1C 6.2 04/07/2022    MR LUMBAR SPINE WO CONTRAST  Result Date: 06/24/2021 CLINICAL DATA:  Low back pain radiating to the left hip and lower extremity EXAM: MRI LUMBAR SPINE WITHOUT CONTRAST TECHNIQUE: Multiplanar, multisequence MR imaging of the lumbar spine was performed. No intravenous contrast was administered. COMPARISON:  None. FINDINGS: Segmentation:  Standard.  Alignment:  Levoscoliosis with apex at L1. Vertebrae:  No fracture, evidence of discitis, or bone lesion. Conus medullaris and cauda equina: Conus extends to the L2 level. Conus and cauda equina appear normal. Paraspinal and other soft tissues: Negative Disc levels: T12-L1: Small disc bulge.  Mild right foraminal  stenosis. L1-L2: Right asymmetric disc space narrowing with endplate spurring. No spinal canal stenosis. Mild right neural foraminal stenosis. L2-L3: Moderate facet hypertrophy. Small disc bulge. No spinal canal stenosis. Moderate left neural foraminal stenosis. L3-L4: Left asymmetric disc bulge. Mild facet hypertrophy. No spinal canal stenosis. Mild left neural foraminal stenosis. L4-L5: Moderate facet hypertrophy. Left asymmetric disc bulge. Left lateral recess narrowing without central spinal canal stenosis. No neural foraminal stenosis. L5-S1: Normal disc space and facet joints. No spinal canal stenosis. No neural foraminal stenosis. Visualized sacrum: Normal. IMPRESSION: 1. Levoscoliosis with apex at L1. 2. Moderate left L2-3 neural foraminal stenosis. 3. Mild right T12-L1, right L1-L2, left L3-L4 and left L4-L5 neural foraminal stenosis. 4. No central spinal canal stenosis. Electronically Signed   By: Ulyses Jarred [name]   On: 06/24/2021 01:48       Assessment & Plan:   Problem List Items Addressed This Visit     Back pain     Seeing Dr Sharlet Salina.  S/p recent back injection.      Back skin lesion    Persistent back lesion.  Has appt with Dr Phillip Heal in 07/2022.  Would like earlier appt        GERD (gastroesophageal reflux disease)    Has a large paraesophageal hernia.  On aciphex and carafate.  Has seen GI.  Recently evaluated by Dr Dianah Field.  Elected watchful waiting.        Hypercholesterolemia    Continue crestor.  Low cholesterol diet and exercise.  Follow lipid panel and liver function tests.        Relevant Orders   Hepatic function panel   Lipid panel   Hyperglycemia    Low carb diet and exercise.  Follow met b and a1c.        Relevant Orders   Hemoglobin A1c   Hypertension    Continue avapro and triam/hctz.  Blood pressures as outlined.  Follow pressures.  Follow metabolic panel.       Relevant Orders   Basic metabolic panel   Obstructive sleep apnea    Using cpap.   Addendum to note :  Pt is using cpap regularly and is benefiting from continued use.        Paraesophageal hiatal hernia    Saw Dianah Field.  Elected watchful waiting.        Stress    Overall appears to be handling things relatively well.  Follow.         Einar Pheasant, MD

## 2022-04-13 DIAGNOSIS — M5412 Radiculopathy, cervical region: Secondary | ICD-10-CM | POA: Diagnosis not present

## 2022-04-13 DIAGNOSIS — M4802 Spinal stenosis, cervical region: Secondary | ICD-10-CM | POA: Diagnosis not present

## 2022-04-18 ENCOUNTER — Encounter: Payer: Self-pay | Admitting: Internal Medicine

## 2022-04-18 ENCOUNTER — Telehealth: Payer: Self-pay | Admitting: Internal Medicine

## 2022-04-18 DIAGNOSIS — L989 Disorder of the skin and subcutaneous tissue, unspecified: Secondary | ICD-10-CM | POA: Insufficient documentation

## 2022-04-18 NOTE — Assessment & Plan Note (Signed)
Low carb diet and exercise.  Follow met b and a1c.   ?

## 2022-04-18 NOTE — Assessment & Plan Note (Signed)
Continue crestor.  Low cholesterol diet and exercise. Follow lipid panel and liver function tests.   

## 2022-04-18 NOTE — Assessment & Plan Note (Signed)
Overall appears to be handling things relatively well.  Follow.   

## 2022-04-18 NOTE — Assessment & Plan Note (Signed)
Persistent back lesion.  Has appt with Dr Phillip Heal in 07/2022.  Would like earlier appt   ?

## 2022-04-18 NOTE — Assessment & Plan Note (Signed)
Has a large paraesophageal hernia.  On aciphex and carafate.  Has seen GI.  Recently evaluated by Dr Tim Farrell.  Elected watchful waiting.   

## 2022-04-18 NOTE — Assessment & Plan Note (Addendum)
Using cpap.  Addendum to note :  Pt is using cpap regularly and is benefiting from continued use.

## 2022-04-18 NOTE — Assessment & Plan Note (Signed)
Seeing Dr Sharlet Salina.  S/p recent back injection. ?

## 2022-04-18 NOTE — Assessment & Plan Note (Signed)
Saw Carla Ryan.  Elected watchful waiting.   

## 2022-04-18 NOTE — Telephone Encounter (Signed)
She currently has a regular appt in 07/2022 with Dr Phillip Heal - dermatologist in [place].  Has a back lesion.  Please call and see if we can get her an earlier appt.  Thanks.   ?

## 2022-04-18 NOTE — Assessment & Plan Note (Signed)
Continue avapro and triam/hctz.  Blood pressures as outlined.  Follow pressures.  Follow metabolic panel.  

## 2022-04-19 NOTE — Telephone Encounter (Signed)
I called [facility] & they were able to scheduled patient tomorrow 04/20/22 at 3p. I have called patient & she was agreeable to appointment. ?

## 2022-04-20 DIAGNOSIS — L821 Other seborrheic keratosis: Secondary | ICD-10-CM | POA: Diagnosis not present

## 2022-04-20 DIAGNOSIS — I781 Nevus, non-neoplastic: Secondary | ICD-10-CM | POA: Diagnosis not present

## 2022-05-10 DIAGNOSIS — M4802 Spinal stenosis, cervical region: Secondary | ICD-10-CM | POA: Diagnosis not present

## 2022-05-10 DIAGNOSIS — M5416 Radiculopathy, lumbar region: Secondary | ICD-10-CM | POA: Diagnosis not present

## 2022-05-10 DIAGNOSIS — M48062 Spinal stenosis, lumbar region with neurogenic claudication: Secondary | ICD-10-CM | POA: Diagnosis not present

## 2022-05-10 DIAGNOSIS — M5412 Radiculopathy, cervical region: Secondary | ICD-10-CM | POA: Diagnosis not present

## 2022-06-09 ENCOUNTER — Ambulatory Visit (INDEPENDENT_AMBULATORY_CARE_PROVIDER_SITE_OTHER): Payer: Medicare HMO

## 2022-06-09 VITALS — Ht 60.0 in | Wt 146.0 lb

## 2022-06-09 DIAGNOSIS — Z0000 Encounter for general adult medical examination without abnormal findings: Secondary | ICD-10-CM

## 2022-06-09 NOTE — Progress Notes (Signed)
Subjective:   Carla Ryan is a 76 y.o. female who presents for Medicare Annual (Subsequent) preventive examination.  Review of Systems    No ROS.  Medicare Wellness Virtual Visit.  Visual/audio telehealth visit, UTA vital signs.   See social history for additional risk factors.   Cardiac Risk Factors include: advanced age (>71mn, >>45women)     Objective:    Today's Vitals   06/09/22 1238  Weight: 146 lb (66.2 kg)  Height: 5' (1.524 m)   Body mass index is 28.51 kg/m.     06/09/2022   12:41 PM 01/13/2021   10:09 AM 08/17/2020   12:34 PM 08/11/2020    7:03 PM 04/03/2020    8:00 AM 05/03/2018    9:28 AM 05/02/2017    9:25 AM  Advanced Directives  Does Patient Have a Medical Advance Directive? Yes Yes No No No Yes Yes  Type of AEstate agentof [place] will HLuce Does patient want to make changes to medical advance directive? No - Patient declined No - Patient declined    No - Patient declined No - Patient declined  Copy of [place] Chart? No - copy requested     No - copy requested   Would patient like information on creating a medical advance directive?    No - Patient declined No - Patient declined      Current Medications (verified) Outpatient Encounter Medications as of 06/09/2022  Medication Sig   aspirin 81 MG tablet Take 81 mg by mouth daily.   Azelastine HCl 137 MCG/SPRAY SOLN SPRAY TWO SPRAYS IN EACH NOSTRIL TWICE DAILY AS DIRECTED   calcium citrate-vitamin D 500-400 MG-UNIT chewable tablet Chew 1 tablet by mouth 2 (two) times daily.   cholecalciferol (VITAMIN D) 1000 UNITS tablet Take 2,000 Units by mouth in the morning and at bedtime.    fluticasone (FLONASE) 50 MCG/ACT nasal spray PLACE 2 SPRAYS INTO BOTH NOSTRILS DAILY.   gabapentin (NEURONTIN) 100 MG capsule TAKE 2 CAPSULES AT BEDTIME   irbesartan (AVAPRO) 75 MG tablet TAKE 1 TABLET EVERY DAY    levocetirizine (XYZAL) 5 MG tablet TAKE 1 TABLET EVERY EVENING   methocarbamol (ROBAXIN) 500 MG tablet Take 1 tablet (500 mg total) by mouth at bedtime as needed for muscle spasms.   Misc Natural Products (TART CHERRY ADVANCED) CAPS Take 1 capsule by mouth 2 (two) times daily. Reported on 03/04/2016   MULTIPLE MINERALS-VITAMINS PO    Multiple Vitamin (MULTIVITAMIN) tablet Take 1 tablet by mouth daily.   nystatin cream (MYCOSTATIN) Apply 1 application topically 2 (two) times daily.   PEPPERMINT OIL PO Take by mouth.   RABEprazole (ACIPHEX) 20 MG tablet Take 1 tablet (20 mg total) by mouth 2 (two) times daily.   rosuvastatin (CRESTOR) 5 MG tablet TAKE 1 TABLET (5 MG TOTAL) BY MOUTH 4 (FOUR) TIMES A WEEK.   sucralfate (CARAFATE) 1 g tablet Take 1 tablet (1 g total) by mouth 2 (two) times daily. (Patient taking differently: Take 1 g by mouth in the morning, at noon, and at bedtime.)   triamterene-hydrochlorothiazide (MAXZIDE-25) 37.5-25 MG tablet TAKE 1/2 TABLET EVERY DAY   No facility-administered encounter medications on file as of 06/09/2022.    Allergies (verified) Patient has no known allergies.   History: Past Medical History:  Diagnosis Date   Abdominal hernia    Anxiety    situational stress and anxiety  Cancer ([place]) 2015   breast    Cataract cortical, senile, unspecified laterality    Chest pain    Exertional dyspnea    GERD (gastroesophageal reflux disease)    Hiatal hernia    Hiatal hernia    Hypercholesterolemia    Hyperlipidemia    Hypertension    Osteoarthritis    scoliosis, degenerative disc dz, knee, carpal tunnel   Sleep apnea    Past Surgical History:  Procedure Laterality Date   ABDOMINAL HYSTERECTOMY  07/2000   APPENDECTOMY  2000   APPENDECTOMY     BREAST BIOPSY Bilateral 2014   BREAST SURGERY Right 2015   lumpectomy   CARPAL TUNNEL RELEASE     COLONOSCOPY  2011   [name], [name]: Normal exam   COLONOSCOPY WITH PROPOFOL N/A 04/03/2020   Procedure:  COLONOSCOPY WITH PROPOFOL;  Surgeon: Toledo, Benay Pike, MD;  Location: [facility] ENDOSCOPY;  Service: Gastroenterology;  Laterality: N/A;   ESOPHAGOGASTRODUODENOSCOPY (EGD) WITH PROPOFOL N/A 04/03/2020   Procedure: ESOPHAGOGASTRODUODENOSCOPY (EGD) WITH PROPOFOL;  Surgeon: Toledo, Benay Pike, MD;  Location: [facility] ENDOSCOPY;  Service: Gastroenterology;  Laterality: N/A;   HERNIA REPAIR  02/10/15   Laparoscopic Repair of Lateral Abdominal Wall Hernia  02/10/15   MASTECTOMY     UPPER GI ENDOSCOPY  05/10/2014   [name], [name] Bile gastritis, medium hiatal hernia. Incomplete visualization of the cardia and fundus.   Family History  Problem Relation Age of Onset   Diabetes Mother    Multiple myeloma Mother    Multiple myeloma Father    Diabetes Sister        x2   Diabetes Brother    Hypertension Brother    Dementia Brother    Social History   Socioeconomic History   Marital status: Married    Spouse name: Not on file   Number of children: 2   Years of education: Not on file   Highest education level: Not on file  Occupational History   Not on file  Tobacco Use   Smoking status: Never   Smokeless tobacco: Never  Vaping Use   Vaping Use: Never used  Substance and Sexual Activity   Alcohol use: Yes    Alcohol/week: 0.0 standard drinks of alcohol    Comment: wine occasionally   Drug use: No   Sexual activity: Yes  Other Topics Concern   Not on file  Social History Narrative   Not on file   Social Determinants of Health   Financial Resource Strain: Low Risk  (06/09/2022)   Overall Financial Resource Strain (CARDIA)    Difficulty of Paying Living Expenses: Not hard at all  Food Insecurity: No Food Insecurity (06/09/2022)   Hunger Vital Sign    Worried About Running Out of Food in the Last Year: Never true    Ran Out of Food in the Last Year: Never true  Transportation Needs: No Transportation Needs (06/09/2022)   PRAPARE - Hydrologist (Medical): No     Lack of Transportation (Non-Medical): No  Physical Activity: Insufficiently Active (06/09/2022)   Exercise Vital Sign    Days of Exercise per Week: 3 days    Minutes of Exercise per Session: 30 min  Stress: No Stress Concern Present (06/09/2022)   [place]    Feeling of Stress : Not at all  Social Connections: Unknown (06/09/2022)   Social Connection and Isolation Panel [NHANES]    Frequency of Communication with  Friends and Family: Not on file    Frequency of Social Gatherings with Friends and Family: Not on file    Attends Religious Services: Not on file    Active Member of Clubs or Organizations: Not on file    Attends Archivist Meetings: Not on file    Marital Status: Married    Tobacco Counseling Counseling given: Not Answered   Clinical Intake:                         Activities of Daily Living    06/09/2022   12:53 PM  In your present state of health, do you have any difficulty performing the following activities:  Hearing? 0  Vision? 0  Difficulty concentrating or making decisions? 0  Walking or climbing stairs? 0  Dressing or bathing? 0  Doing errands, shopping? 0  Preparing Food and eating ? N  Using the Toilet? N  In the past six months, have you accidently leaked urine? N  Do you have problems with loss of bowel control? N  Managing your Medications? N  Managing your Finances? N  Housekeeping or managing your Housekeeping? N    Patient Care Team: Einar Pheasant, MD as PCP - General (Internal Medicine) Einar Pheasant, MD (Internal Medicine) Bary Castilla Forest Gleason, MD (General Surgery)  Indicate any recent Medical Services you may have received from other than [facility] providers in the past year (date may be approximate).     Assessment:   This is a routine wellness examination for Carla Ryan.  Virtual Visit via Telephone Note  I connected with  [name] on  48/18/56 at 12:30 PM EDT by telephone and verified that I am speaking with the correct person using two identifiers.  Persons participating in the virtual visit: patient/Nurse Health Advisor   I discussed the limitations of performing an evaluation and management service by telehealth. We continued and completed visit with audio only. Some vital signs may be absent or patient reported.   Hearing/Vision screen Hearing Screening - Comments:: Patient is able to hear conversational tones without difficulty. No issues reported.  Vision Screening - Comments:: Followed by [facility]  Wears corrective lenses  They have regular follow up with the ophthalmologist  Dietary issues and exercise activities discussed: Current Exercise Habits: Home exercise routine, Intensity: Mild Healthy diet Good water intake   Goals Addressed             This Visit's Progress    Increase physical activity       Stay active       Depression Screen    06/09/2022   12:44 PM 04/09/2022    8:56 AM 08/05/2021    9:48 AM 11/26/2020   11:09 AM 07/14/2020    9:08 AM 07/10/2019    8:55 AM 05/03/2018    9:31 AM  PHQ 2/9 Scores  PHQ - 2 Score 0 0 2 0 0 0 0  PHQ- 9 Score   5        Fall Risk    06/09/2022   12:53 PM 04/09/2022    8:56 AM 12/09/2021    9:50 AM 11/26/2020   11:09 AM 07/14/2020    9:08 AM  Fall Risk   Falls in the past year? 0 0 0 0 0  Number falls in past yr:   0 0   Injury with Fall?   0 0   Risk for fall due to :  No  Fall Risks No Fall Risks    Follow up Falls evaluation completed Falls evaluation completed Falls evaluation completed Falls evaluation completed Falls evaluation completed    [place]: Home free of loose throw rugs in walkways, pet beds, electrical cords, etc? Yes  Adequate lighting in your home to reduce risk of falls? Yes   ASSISTIVE DEVICES UTILIZED TO PREVENT FALLS: Life alert? No  Use of a cane, walker or w/c? No   TIMED  UP AND GO: Was the test performed? No .   Cognitive Function: Patient is alert and oriented x3.      05/03/2018    9:40 AM 05/02/2017    9:35 AM 11/07/2015    9:33 AM  MMSE - Mini Mental State Exam  Orientation to time 5 5 5   Orientation to Place 5 5 5   Registration 3 3 3   Attention/ Calculation 5 5 5   Recall 3 3 3   Language- name 2 objects 2 2 2   Language- repeat 1 1 1   Language- follow 3 step command 3 3 3   Language- read & follow direction 1 1 1   Write a sentence 1 1 1   Copy design 1 1 1   Total score 30 30 30         Immunizations Immunization History  Administered Date(s) Administered   Fluad Quad(high Dose 65+) 10/17/2019, 09/22/2020, 10/12/2021   Influenza Split 10/25/2012   Influenza, High Dose Seasonal PF 11/07/2015, 09/03/2016, 10/05/2017, 10/12/2018   Influenza,inj,Quad PF,6+ Mos 09/28/2013, 10/08/2014   PFIZER(Purple Top)SARS-COV-2 Vaccination 01/07/2020, 01/28/2020   Pneumococcal Conjugate-13 11/27/2013   Pneumococcal Polysaccharide-23 11/05/2015   Td 12/04/2015   Tdap 12/20/2004   Zoster Recombinat (Shingrix) 10/02/2019, 03/25/2020   Zoster, Live 12/20/2008   Screening Tests Health Maintenance  Topic Date Due   COVID-19 Vaccine (3 - Pfizer risk series) 06/25/2022 (Originally 02/25/2020)   INFLUENZA VACCINE  07/20/2022   TETANUS/TDAP  12/03/2025   Pneumonia Vaccine 65+ Years old  Completed   DEXA SCAN  Completed   Hepatitis C Screening  Completed   Zoster Vaccines- Shingrix  Completed   HPV VACCINES  Aged Out   COLONOSCOPY (Pts 45-76yr Insurance coverage will need to be confirmed)  Discontinued   Health Maintenance There are no preventive care reminders to display for this patient.  Lung Cancer Screening: (Low Dose CT Chest recommended if Age 78-80years, 30 pack-year currently smoking OR have quit w/in 15years.) does not qualify.   Vision Screening: Recommended annual ophthalmology exams for early detection of glaucoma and other disorders of the  eye.  Dental Screening: Recommended annual dental exams for proper oral hygiene  Community Resource Referral / Chronic Care Management: CRR required this visit?  No   CCM required this visit?  No      Plan:   Keep all routine maintenance appointments.   I have personally reviewed and noted the following in the patient's chart:   Medical and social history Use of alcohol, tobacco or illicit drugs  Current medications and supplements including opioid prescriptions.  Functional ability and status Nutritional status Physical activity Advanced directives List of other physicians Hospitalizations, surgeries, and ER visits in previous 12 months Vitals Screenings to include cognitive, depression, and falls Referrals and appointments  In addition, I have reviewed and discussed with patient certain preventive protocols, quality metrics, and best practice recommendations. A written personalized care plan for preventive services as well as general preventive health recommendations were provided to patient.     OArby Barrette  Aideen Fenster L, LPN   08/25/255

## 2022-06-09 NOTE — Patient Instructions (Addendum)
  Carla Ryan , Thank you for taking time to come for your Medicare Wellness Visit. I appreciate your ongoing commitment to your health goals. Please review the following plan we discussed and let me know if I can assist you in the future.   These are the goals we discussed:  Goals      Increase physical activity     Stay active        This is a list of the screening recommended for you and due dates:  Health Maintenance  Topic Date Due   COVID-19 Vaccine (3 - Pfizer risk series) 06/25/2022*   Flu Shot  07/20/2022   Tetanus Vaccine  12/03/2025   Pneumonia Vaccine  Completed   DEXA scan (bone density measurement)  Completed   Hepatitis C Screening: USPSTF Recommendation to screen - Ages 96-79 yo.  Completed   Zoster (Shingles) Vaccine  Completed   HPV Vaccine  Aged Out   Colon Cancer Screening  Discontinued  *Topic was postponed. The date shown is not the original due date.

## 2022-06-19 ENCOUNTER — Other Ambulatory Visit: Payer: Self-pay | Admitting: Internal Medicine

## 2022-06-29 ENCOUNTER — Other Ambulatory Visit: Payer: Self-pay | Admitting: Internal Medicine

## 2022-07-16 ENCOUNTER — Encounter: Payer: Self-pay | Admitting: Internal Medicine

## 2022-07-27 DIAGNOSIS — M5416 Radiculopathy, lumbar region: Secondary | ICD-10-CM | POA: Diagnosis not present

## 2022-07-27 DIAGNOSIS — M48062 Spinal stenosis, lumbar region with neurogenic claudication: Secondary | ICD-10-CM | POA: Diagnosis not present

## 2022-07-27 NOTE — Telephone Encounter (Signed)
[facility] called stating they are missing face to face notes regarding pt using and benefiting from current cpap machine Phone number-[phone] Fax number-[phone]

## 2022-07-28 NOTE — Telephone Encounter (Signed)
Addendum has been added to note

## 2022-07-28 NOTE — Telephone Encounter (Signed)
Faxed to [facility]. Confirmation recieved

## 2022-07-29 ENCOUNTER — Other Ambulatory Visit: Payer: Self-pay | Admitting: Internal Medicine

## 2022-07-29 DIAGNOSIS — Z86018 Personal history of other benign neoplasm: Secondary | ICD-10-CM | POA: Diagnosis not present

## 2022-07-29 DIAGNOSIS — L578 Other skin changes due to chronic exposure to nonionizing radiation: Secondary | ICD-10-CM | POA: Diagnosis not present

## 2022-07-29 DIAGNOSIS — D485 Neoplasm of uncertain behavior of skin: Secondary | ICD-10-CM | POA: Diagnosis not present

## 2022-07-29 DIAGNOSIS — D225 Melanocytic nevi of trunk: Secondary | ICD-10-CM | POA: Diagnosis not present

## 2022-08-03 DIAGNOSIS — K219 Gastro-esophageal reflux disease without esophagitis: Secondary | ICD-10-CM | POA: Diagnosis not present

## 2022-08-03 DIAGNOSIS — K449 Diaphragmatic hernia without obstruction or gangrene: Secondary | ICD-10-CM | POA: Diagnosis not present

## 2022-08-03 DIAGNOSIS — R112 Nausea with vomiting, unspecified: Secondary | ICD-10-CM | POA: Diagnosis not present

## 2022-08-05 ENCOUNTER — Telehealth: Payer: Self-pay | Admitting: Pulmonary Disease

## 2022-08-05 NOTE — Telephone Encounter (Signed)
Spoke to patient. She is requesting cpap order to be placed to [facility]. Last OV 09/2020. She is aware that appt is needed prior to order being placed. She stated that she would attempt to obtain a new machine based off of previously sleep study. She did not wish to schedule an appt at this time.  Nothing further needed.

## 2022-08-09 ENCOUNTER — Ambulatory Visit: Payer: Medicare HMO | Admitting: Internal Medicine

## 2022-08-24 ENCOUNTER — Other Ambulatory Visit (INDEPENDENT_AMBULATORY_CARE_PROVIDER_SITE_OTHER): Payer: Medicare HMO

## 2022-08-24 DIAGNOSIS — R739 Hyperglycemia, unspecified: Secondary | ICD-10-CM | POA: Diagnosis not present

## 2022-08-24 DIAGNOSIS — I10 Essential (primary) hypertension: Secondary | ICD-10-CM | POA: Diagnosis not present

## 2022-08-24 DIAGNOSIS — E7800 Pure hypercholesterolemia, unspecified: Secondary | ICD-10-CM | POA: Diagnosis not present

## 2022-08-24 LAB — BASIC METABOLIC PANEL
BUN: 16 mg/dL (ref 6–23)
CO2: 26 mEq/L (ref 19–32)
Calcium: 9.6 mg/dL (ref 8.4–10.5)
Chloride: 102 mEq/L (ref 96–112)
Creatinine, Ser: 0.94 mg/dL (ref 0.40–1.20)
GFR: 58.1 mL/min — ABNORMAL LOW (ref >60.00)
Glucose, Bld: 89 mg/dL (ref 70–99)
Potassium: 4 mEq/L (ref 3.5–5.1)
Sodium: 138 mEq/L (ref 135–145)

## 2022-08-24 LAB — LIPID PANEL
Cholesterol: 165 mg/dL (ref 0–200)
HDL: 64.1 mg/dL (ref >39.00)
LDL Cholesterol: 81 mg/dL (ref 0–99)
NonHDL: 100.92
Total CHOL/HDL Ratio: 3
Triglycerides: 98 mg/dL (ref 0.0–149.0)
VLDL: 19.6 mg/dL (ref 0.0–40.0)

## 2022-08-24 LAB — HEPATIC FUNCTION PANEL
ALT: 16 U/L (ref 0–35)
AST: 18 U/L (ref 0–37)
Albumin: 3.9 g/dL (ref 3.5–5.2)
Alkaline Phosphatase: 44 U/L (ref 39–117)
Bilirubin, Direct: 0.1 mg/dL (ref 0.0–0.3)
Total Bilirubin: 0.6 mg/dL (ref 0.2–1.2)
Total Protein: 6.2 g/dL (ref 6.0–8.3)

## 2022-08-24 LAB — HEMOGLOBIN A1C: Hgb A1c MFr Bld: 6.3 % (ref 4.6–6.5)

## 2022-08-26 DIAGNOSIS — E7800 Pure hypercholesterolemia, unspecified: Secondary | ICD-10-CM | POA: Diagnosis not present

## 2022-08-26 DIAGNOSIS — R0609 Other forms of dyspnea: Secondary | ICD-10-CM | POA: Diagnosis not present

## 2022-08-26 DIAGNOSIS — G4733 Obstructive sleep apnea (adult) (pediatric): Secondary | ICD-10-CM | POA: Diagnosis not present

## 2022-08-26 DIAGNOSIS — I351 Nonrheumatic aortic (valve) insufficiency: Secondary | ICD-10-CM | POA: Diagnosis not present

## 2022-08-26 DIAGNOSIS — I10 Essential (primary) hypertension: Secondary | ICD-10-CM | POA: Diagnosis not present

## 2022-08-26 DIAGNOSIS — R002 Palpitations: Secondary | ICD-10-CM | POA: Diagnosis not present

## 2022-08-27 ENCOUNTER — Ambulatory Visit (INDEPENDENT_AMBULATORY_CARE_PROVIDER_SITE_OTHER): Payer: Medicare HMO | Admitting: Internal Medicine

## 2022-08-27 ENCOUNTER — Encounter: Payer: Self-pay | Admitting: Internal Medicine

## 2022-08-27 DIAGNOSIS — R110 Nausea: Secondary | ICD-10-CM | POA: Diagnosis not present

## 2022-08-27 DIAGNOSIS — Z853 Personal history of malignant neoplasm of breast: Secondary | ICD-10-CM | POA: Insufficient documentation

## 2022-08-27 DIAGNOSIS — R739 Hyperglycemia, unspecified: Secondary | ICD-10-CM | POA: Diagnosis not present

## 2022-08-27 DIAGNOSIS — I10 Essential (primary) hypertension: Secondary | ICD-10-CM | POA: Diagnosis not present

## 2022-08-27 DIAGNOSIS — R1010 Upper abdominal pain, unspecified: Secondary | ICD-10-CM

## 2022-08-27 DIAGNOSIS — K219 Gastro-esophageal reflux disease without esophagitis: Secondary | ICD-10-CM

## 2022-08-27 DIAGNOSIS — K449 Diaphragmatic hernia without obstruction or gangrene: Secondary | ICD-10-CM | POA: Diagnosis not present

## 2022-08-27 DIAGNOSIS — F439 Reaction to severe stress, unspecified: Secondary | ICD-10-CM

## 2022-08-27 DIAGNOSIS — G4733 Obstructive sleep apnea (adult) (pediatric): Secondary | ICD-10-CM | POA: Diagnosis not present

## 2022-08-27 DIAGNOSIS — E7800 Pure hypercholesterolemia, unspecified: Secondary | ICD-10-CM | POA: Diagnosis not present

## 2022-08-27 NOTE — Progress Notes (Unsigned)
Patient ID: Carla Ryan, female   DOB: 01/14/1946, 76 y.o.   MRN: [id number]   Subjective:    Patient ID: Carla Ryan, female    DOB: 30-Jul-1946, 76 y.o.   MRN: [id number]   Patient here for  Chief Complaint  Patient presents with   Follow-up    4 month follow up. Would like to discuss C-Pap issues with new supplier.     Marland Kitchen   HPI Here to follow up regarding her blood pressure and cholesterol.  Saw cardiology yesterday.  Had f/u with GI 08/03/22 - using psyllium - bowels improved.  Seeing Dr Sharlet Salina - f/u neck pain/back pain.  Recommended continue tylenol and gabapentin.  Scheduled for injection next week.  No chest pain.  Breathing stable.  Acid reflux controlled - PPI and carafate.  Has noticed intermittent nausea.  Some upper abdominal pain associated.  Cough and congestion - improved with otc allergy medication.    Past Medical History:  Diagnosis Date   Abdominal hernia    Anxiety    situational stress and anxiety   Cancer ([place]) 2015   breast    Cataract cortical, senile, unspecified laterality    Chest pain    Exertional dyspnea    GERD (gastroesophageal reflux disease)    Hiatal hernia    Hiatal hernia    Hypercholesterolemia    Hyperlipidemia    Hypertension    Osteoarthritis    scoliosis, degenerative disc dz, knee, carpal tunnel   Sleep apnea    Past Surgical History:  Procedure Laterality Date   ABDOMINAL HYSTERECTOMY  07/2000   APPENDECTOMY  2000   APPENDECTOMY     BREAST BIOPSY Bilateral 2014   BREAST SURGERY Right 2015   lumpectomy   CARPAL TUNNEL RELEASE     COLONOSCOPY  2011   [name], [name]: Normal exam   COLONOSCOPY WITH PROPOFOL N/A 04/03/2020   Procedure: COLONOSCOPY WITH PROPOFOL;  Surgeon: Toledo, Benay Pike, MD;  Location: [facility] ENDOSCOPY;  Service: Gastroenterology;  Laterality: N/A;   ESOPHAGOGASTRODUODENOSCOPY (EGD) WITH PROPOFOL N/A 04/03/2020   Procedure: ESOPHAGOGASTRODUODENOSCOPY (EGD) WITH PROPOFOL;  Surgeon: Toledo, Benay Pike,  MD;  Location: [facility] ENDOSCOPY;  Service: Gastroenterology;  Laterality: N/A;   HERNIA REPAIR  02/10/15   Laparoscopic Repair of Lateral Abdominal Wall Hernia  02/10/15   MASTECTOMY     UPPER GI ENDOSCOPY  05/10/2014   [name], [name] Bile gastritis, medium hiatal hernia. Incomplete visualization of the cardia and fundus.   Family History  Problem Relation Age of Onset   Diabetes Mother    Multiple myeloma Mother    Multiple myeloma Father    Diabetes Sister        x2   Diabetes Brother    Hypertension Brother    Dementia Brother    Social History   Socioeconomic History   Marital status: Married    Spouse name: Not on file   Number of children: 2   Years of education: Not on file   Highest education level: Not on file  Occupational History   Not on file  Tobacco Use   Smoking status: Never   Smokeless tobacco: Never  Vaping Use   Vaping Use: Never used  Substance and Sexual Activity   Alcohol use: Yes    Alcohol/week: 0.0 standard drinks of alcohol    Comment: wine occasionally   Drug use: No   Sexual activity: Yes  Other Topics Concern   Not on file  Social History Narrative  Not on file   Social Determinants of Health   Financial Resource Strain: Low Risk  (06/09/2022)   Overall Financial Resource Strain (CARDIA)    Difficulty of Paying Living Expenses: Not hard at all  Food Insecurity: No Food Insecurity (06/09/2022)   Hunger Vital Sign    Worried About Running Out of Food in the Last Year: Never true    Ran Out of Food in the Last Year: Never true  Transportation Needs: No Transportation Needs (06/09/2022)   PRAPARE - Hydrologist (Medical): No    Lack of Transportation (Non-Medical): No  Physical Activity: Insufficiently Active (06/09/2022)   Exercise Vital Sign    Days of Exercise per Week: 3 days    Minutes of Exercise per Session: 30 min  Stress: No Stress Concern Present (06/09/2022)   [place]    Feeling of Stress : Not at all  Social Connections: Unknown (06/09/2022)   Social Connection and Isolation Panel [NHANES]    Frequency of Communication with Friends and Family: Not on file    Frequency of Social Gatherings with Friends and Family: Not on file    Attends Religious Services: Not on file    Active Member of Clubs or Organizations: Not on file    Attends Archivist Meetings: Not on file    Marital Status: Married     Review of Systems     Objective:     BP (!) 124/58 (BP Location: Left Arm, Patient Position: Sitting, Cuff Size: Normal)   Pulse 66   Temp 98.2 F (36.8 C) (Oral)   Ht 5' (1.524 m)   Wt 142 lb 3.2 oz (64.5 kg)   SpO2 96%   BMI 27.77 kg/m  Wt Readings from Last 3 Encounters:  08/27/22 142 lb 3.2 oz (64.5 kg)  06/09/22 146 lb (66.2 kg)  04/09/22 146 lb (66.2 kg)    Physical Exam   Outpatient Encounter Medications as of 08/27/2022  Medication Sig   aspirin 81 MG tablet Take 81 mg by mouth daily.   Azelastine HCl 137 MCG/SPRAY SOLN SPRAY TWO SPRAYS IN EACH NOSTRIL TWICE DAILY AS DIRECTED   calcium citrate-vitamin D 500-400 MG-UNIT chewable tablet Chew 1 tablet by mouth 2 (two) times daily.   cholecalciferol (VITAMIN D) 1000 UNITS tablet Take 2,000 Units by mouth in the morning and at bedtime.    fluticasone (FLONASE) 50 MCG/ACT nasal spray USE 2 SPRAYS IN EACH NOSTRIL EVERY DAY   gabapentin (NEURONTIN) 100 MG capsule TAKE 2 CAPSULES AT BEDTIME   irbesartan (AVAPRO) 75 MG tablet TAKE 1 TABLET EVERY DAY   levocetirizine (XYZAL) 5 MG tablet TAKE 1 TABLET EVERY EVENING   methocarbamol (ROBAXIN) 500 MG tablet Take 1 tablet (500 mg total) by mouth at bedtime as needed for muscle spasms.   Misc Natural Products (TART CHERRY ADVANCED) CAPS Take 1 capsule by mouth 2 (two) times daily. Reported on 03/04/2016   MULTIPLE MINERALS-VITAMINS PO    nystatin cream (MYCOSTATIN) Apply 1 application topically 2  (two) times daily.   PEPPERMINT OIL PO Take by mouth.   RABEprazole (ACIPHEX) 20 MG tablet Take 1 tablet (20 mg total) by mouth 2 (two) times daily.   rosuvastatin (CRESTOR) 5 MG tablet TAKE 1 TABLET (5 MG TOTAL) BY MOUTH 4 (FOUR) TIMES A WEEK.   sucralfate (CARAFATE) 1 g tablet Take 1 tablet (1 g total) by mouth 2 (two) times daily. (Patient  taking differently: Take 1 g by mouth in the morning, at noon, and at bedtime.)   triamterene-hydrochlorothiazide (MAXZIDE-25) 37.5-25 MG tablet TAKE 1/2 TABLET EVERY DAY   [DISCONTINUED] Multiple Vitamin (MULTIVITAMIN) tablet Take 1 tablet by mouth daily.   No facility-administered encounter medications on file as of 08/27/2022.     Lab Results  Component Value Date   WBC 5.2 12/07/2021   HGB 12.8 12/07/2021   HCT 37.9 12/07/2021   PLT 224.0 12/07/2021   GLUCOSE 89 08/24/2022   CHOL 165 08/24/2022   TRIG 98.0 08/24/2022   HDL 64.10 08/24/2022   LDLDIRECT 146.9 01/31/2014   LDLCALC 81 08/24/2022   ALT 16 08/24/2022   AST 18 08/24/2022   NA 138 08/24/2022   K 4.0 08/24/2022   CL 102 08/24/2022   CREATININE 0.94 08/24/2022   BUN 16 08/24/2022   CO2 26 08/24/2022   TSH 1.77 04/07/2022   HGBA1C 6.3 08/24/2022    MR LUMBAR SPINE WO CONTRAST  Result Date: 06/24/2021 CLINICAL DATA:  Low back pain radiating to the left hip and lower extremity EXAM: MRI LUMBAR SPINE WITHOUT CONTRAST TECHNIQUE: Multiplanar, multisequence MR imaging of the lumbar spine was performed. No intravenous contrast was administered. COMPARISON:  None. FINDINGS: Segmentation:  Standard. Alignment:  Levoscoliosis with apex at L1. Vertebrae:  No fracture, evidence of discitis, or bone lesion. Conus medullaris and cauda equina: Conus extends to the L2 level. Conus and cauda equina appear normal. Paraspinal and other soft tissues: Negative Disc levels: T12-L1: Small disc bulge.  Mild right foraminal stenosis. L1-L2: Right asymmetric disc space narrowing with endplate spurring. No  spinal canal stenosis. Mild right neural foraminal stenosis. L2-L3: Moderate facet hypertrophy. Small disc bulge. No spinal canal stenosis. Moderate left neural foraminal stenosis. L3-L4: Left asymmetric disc bulge. Mild facet hypertrophy. No spinal canal stenosis. Mild left neural foraminal stenosis. L4-L5: Moderate facet hypertrophy. Left asymmetric disc bulge. Left lateral recess narrowing without central spinal canal stenosis. No neural foraminal stenosis. L5-S1: Normal disc space and facet joints. No spinal canal stenosis. No neural foraminal stenosis. Visualized sacrum: Normal. IMPRESSION: 1. Levoscoliosis with apex at L1. 2. Moderate left L2-3 neural foraminal stenosis. 3. Mild right T12-L1, right L1-L2, left L3-L4 and left L4-L5 neural foraminal stenosis. 4. No central spinal canal stenosis. Electronically Signed   By: Ulyses Jarred [name]   On: 06/24/2021 01:48       Assessment & Plan:   Problem List Items Addressed This Visit     History of breast cancer     Einar Pheasant, MD

## 2022-08-28 ENCOUNTER — Encounter: Payer: Self-pay | Admitting: Internal Medicine

## 2022-08-28 DIAGNOSIS — R1010 Upper abdominal pain, unspecified: Secondary | ICD-10-CM | POA: Insufficient documentation

## 2022-08-28 NOTE — Assessment & Plan Note (Signed)
Overall appears to be handling things relatively well.  Follow.   

## 2022-08-28 NOTE — Assessment & Plan Note (Signed)
Using cpap.  Addendum to note :  Pt is using cpap regularly and is benefiting from continued use.  ([facility])

## 2022-08-28 NOTE — Assessment & Plan Note (Signed)
Some nausea with associated with upper abdominal pain.  Continues on PPI and carafate.  Check abdominal ultrasound.

## 2022-08-28 NOTE — Assessment & Plan Note (Signed)
Has a large paraesophageal hernia.  On aciphex and carafate.  Has seen GI.  Recently evaluated by Dr Tim Farrell.  Elected watchful waiting.   

## 2022-08-28 NOTE — Assessment & Plan Note (Signed)
Mammogram 02/22/22 - Birads II.  

## 2022-08-28 NOTE — Assessment & Plan Note (Signed)
Low carb diet and exercise.  Follow met b and a1c.   

## 2022-08-28 NOTE — Assessment & Plan Note (Signed)
Continue crestor.  Low cholesterol diet and exercise. Follow lipid panel and liver function tests.   

## 2022-08-28 NOTE — Assessment & Plan Note (Signed)
Continue avapro and triam/hctz.  Blood pressures as outlined.  Follow pressures.  Follow metabolic panel.  

## 2022-08-28 NOTE — Assessment & Plan Note (Signed)
Saw Carla Ryan.  Elected watchful waiting.

## 2022-09-03 DIAGNOSIS — M4802 Spinal stenosis, cervical region: Secondary | ICD-10-CM | POA: Diagnosis not present

## 2022-09-03 DIAGNOSIS — M5412 Radiculopathy, cervical region: Secondary | ICD-10-CM | POA: Diagnosis not present

## 2022-09-06 ENCOUNTER — Ambulatory Visit
Admission: RE | Admit: 2022-09-06 | Discharge: 2022-09-06 | Disposition: A | Discharge status: Home / Self Care | Payer: Medicare HMO | Source: Ambulatory Visit | Attending: Internal Medicine | Admitting: Internal Medicine

## 2022-09-06 DIAGNOSIS — R110 Nausea: Secondary | ICD-10-CM | POA: Diagnosis not present

## 2022-09-06 DIAGNOSIS — R1010 Upper abdominal pain, unspecified: Secondary | ICD-10-CM | POA: Diagnosis not present

## 2022-09-08 DIAGNOSIS — D235 Other benign neoplasm of skin of trunk: Secondary | ICD-10-CM | POA: Diagnosis not present

## 2022-09-08 DIAGNOSIS — D225 Melanocytic nevi of trunk: Secondary | ICD-10-CM | POA: Diagnosis not present

## 2022-09-13 ENCOUNTER — Other Ambulatory Visit: Payer: Self-pay | Admitting: Internal Medicine

## 2022-09-15 ENCOUNTER — Other Ambulatory Visit: Payer: Self-pay | Admitting: Pulmonary Disease

## 2022-09-15 ENCOUNTER — Other Ambulatory Visit: Payer: Self-pay | Admitting: Internal Medicine

## 2022-09-20 ENCOUNTER — Encounter: Payer: Self-pay | Admitting: Internal Medicine

## 2022-09-21 NOTE — Telephone Encounter (Signed)
I called patient & she will test herself for Covid with at home test. Shew said was feeing better & thought she did not have fever. She does have cough & may need something for cough. Scheduled for VV 11:15 with Mable Paris, FNP.

## 2022-09-22 ENCOUNTER — Encounter: Payer: Self-pay | Admitting: Family

## 2022-09-22 ENCOUNTER — Telehealth (INDEPENDENT_AMBULATORY_CARE_PROVIDER_SITE_OTHER): Payer: Medicare HMO | Admitting: Family

## 2022-09-22 VITALS — Ht 60.0 in | Wt 142.0 lb

## 2022-09-22 DIAGNOSIS — J40 Bronchitis, not specified as acute or chronic: Secondary | ICD-10-CM | POA: Diagnosis not present

## 2022-09-22 MED ORDER — BENZONATATE 100 MG PO CAPS: 100.0000 mg | ORAL_CAPSULE | Freq: Three times a day (TID) | ORAL | 1 refills | Status: DC | PRN

## 2022-09-22 NOTE — Assessment & Plan Note (Signed)
Question duration whether it has been 4 weeks or 4 days.  Patient does have a long history of seasonal allergies and reports  this time a year tends she has more severe allergies.  She is currently on antihistamine regimen and I did advise that this if allergies are not well-controlled, we could consider ENT referral.  She politely declines at this time.  As cough has improved in the past 2 days, patient declines chest x-ray or empiric antibiotic and will call me and let me know how she is doing.  I have provided her with Ladona Ridgel.

## 2022-09-22 NOTE — Progress Notes (Signed)
Virtual Visit via Video Note  I connected with@  on 09/22/22 at 11:15 AM EDT by a video enabled telemedicine application and verified that I am speaking with the correct person using two identifiers.  Location patient: home Location provider:work  Persons participating in the virtual visit: patient, provider  I discussed the limitations of evaluation and management by telemedicine and the availability of in person appointments. The patient expressed understanding and agreed to proceed.   HPI: Complains of cough  x 4 weeks and then 4 days ago changed to heavy productive drainage and sore throat. Cough has gotten better the past 2 days. Sore throat has improved.   Cough is worse at home and when laying down. Tmax 3 days 100F.  Negative covid at home  She had been taking mucinex  DM for 4 weeks. She has taken tylenol.   No sob, cp, wheezing, leg swelling, chills  She has h/o seasonal allergies.  She is compliant with Xyzal, azelastine and Flonase  History of OSA , hypertension, breast cancer, gerd  GFR 58  ROS: See pertinent positives and negatives per HPI.    EXAM:  VITALS per patient if applicable: Ht 5' (4.580 m)   Wt 142 lb (64.4 kg)   BMI 27.73 kg/m  BP Readings from Last 3 Encounters:  08/27/22 (!) 124/58  04/09/22 112/62  12/09/21 138/72   Wt Readings from Last 3 Encounters:  09/22/22 142 lb (64.4 kg)  08/27/22 142 lb 3.2 oz (64.5 kg)  06/09/22 146 lb (66.2 kg)    GENERAL: alert, oriented, appears well and in no acute distress  HEENT: atraumatic, conjunttiva clear, no obvious abnormalities on inspection of external nose and ears  NECK: normal movements of the head and neck  LUNGS: on inspection no signs of respiratory distress, breathing rate appears normal, no obvious gross SOB, gasping or wheezing  CV: no obvious cyanosis  MS: moves all visible extremities without noticeable abnormality  PSYCH/NEURO: pleasant and cooperative, no obvious depression or  anxiety, speech and thought processing grossly intact  ASSESSMENT AND PLAN:  Discussed the following assessment and plan:  Problem List Items Addressed This Visit       Respiratory   Bronchitis - Primary    Question duration whether it has been 4 weeks or 4 days.  Patient does have a long history of seasonal allergies and reports  this time a year tends she has more severe allergies.  She is currently on antihistamine regimen and I did advise that this if allergies are not well-controlled, we could consider ENT referral.  She politely declines at this time.  As cough has improved in the past 2 days, patient declines chest x-ray or empiric antibiotic and will call me and let me know how she is doing.  I have provided her with Ladona Ridgel.      Relevant Medications   benzonatate (TESSALON) 100 MG capsule    -we discussed possible serious and likely etiologies, options for evaluation and workup, limitations of telemedicine visit vs in person visit, treatment, treatment risks and precautions. Pt prefers to treat via telemedicine empirically rather then risking or undertaking an in person visit at this moment.  .   I discussed the assessment and treatment plan with the patient. The patient was provided an opportunity to ask questions and all were answered. The patient agreed with the plan and demonstrated an understanding of the instructions.   The patient was advised to call back or seek an in-person evaluation if  the symptoms worsen or if the condition fails to improve as anticipated.   Mable Paris, FNP

## 2022-10-01 DIAGNOSIS — M48062 Spinal stenosis, lumbar region with neurogenic claudication: Secondary | ICD-10-CM | POA: Diagnosis not present

## 2022-10-01 DIAGNOSIS — M419 Scoliosis, unspecified: Secondary | ICD-10-CM | POA: Diagnosis not present

## 2022-10-01 DIAGNOSIS — M5030 Other cervical disc degeneration, unspecified cervical region: Secondary | ICD-10-CM | POA: Diagnosis not present

## 2022-10-01 DIAGNOSIS — M5416 Radiculopathy, lumbar region: Secondary | ICD-10-CM | POA: Diagnosis not present

## 2022-10-04 ENCOUNTER — Encounter: Payer: Self-pay | Admitting: Internal Medicine

## 2022-10-04 ENCOUNTER — Ambulatory Visit (INDEPENDENT_AMBULATORY_CARE_PROVIDER_SITE_OTHER): Payer: Medicare HMO

## 2022-10-04 ENCOUNTER — Ambulatory Visit (INDEPENDENT_AMBULATORY_CARE_PROVIDER_SITE_OTHER): Payer: Medicare HMO | Admitting: Internal Medicine

## 2022-10-04 DIAGNOSIS — I10 Essential (primary) hypertension: Secondary | ICD-10-CM | POA: Diagnosis not present

## 2022-10-04 DIAGNOSIS — E7800 Pure hypercholesterolemia, unspecified: Secondary | ICD-10-CM | POA: Diagnosis not present

## 2022-10-04 DIAGNOSIS — R053 Chronic cough: Secondary | ICD-10-CM

## 2022-10-04 DIAGNOSIS — K219 Gastro-esophageal reflux disease without esophagitis: Secondary | ICD-10-CM

## 2022-10-04 DIAGNOSIS — K449 Diaphragmatic hernia without obstruction or gangrene: Secondary | ICD-10-CM | POA: Diagnosis not present

## 2022-10-04 DIAGNOSIS — G4733 Obstructive sleep apnea (adult) (pediatric): Secondary | ICD-10-CM | POA: Diagnosis not present

## 2022-10-04 DIAGNOSIS — R739 Hyperglycemia, unspecified: Secondary | ICD-10-CM

## 2022-10-04 MED ORDER — ALBUTEROL SULFATE HFA 108 (90 BASE) MCG/ACT IN AERS: 2.0000 | INHALATION_SPRAY | Freq: Four times a day (QID) | RESPIRATORY_TRACT | 0 refills | Status: DC | PRN

## 2022-10-04 MED ORDER — PREDNISONE 10 MG PO TABS: ORAL_TABLET | ORAL | 0 refills | Status: DC

## 2022-10-04 MED ORDER — AZITHROMYCIN 250 MG PO TABS: ORAL_TABLET | ORAL | 0 refills | Status: AC

## 2022-10-04 NOTE — Progress Notes (Signed)
Patient ID: Carla Ryan, female   DOB: 05-24-1946, 76 y.o.   MRN: [id number]   Subjective:    Patient ID: Carla Ryan, female    DOB: [birth year], 76 y.o.   MRN: [id number]   Patient here for  Chief Complaint  Patient presents with   Cough    Pt would like to discuss CPAP and also about her cough she still has since the 1st of september   .   HPI Here to follow up regarding sleep apnea and to discuss cpap use.  She is using cpap regularly - reports averaging approximately 7 hours per night.  Does benefit from using.  Feels better using.  She is also seeing Dr Sherlynn Carbon - f/u neck and back pain.  Evaluated by Loree Fee 10/01/22 - continue tylenol prn, gabapentin and tramadol prn.  Seen 09/22/22 - cough - prescribed tessalon perles.  Reports persistent increased cough.  Having coughing fits.  Increased cough/congestion. No chest pain.  No significant increased sob reported - just persistent increased cough.  Taking aciphex bid and carafate to try to control acid reflux.  No abdominal pain reported.  Some drainage at night.     Past Medical History:  Diagnosis Date   Abdominal hernia    Anxiety    situational stress and anxiety   Cancer ([place]) 2015   breast    Cataract cortical, senile, unspecified laterality    Chest pain    Exertional dyspnea    GERD (gastroesophageal reflux disease)    Hiatal hernia    Hiatal hernia    Hypercholesterolemia    Hyperlipidemia    Hypertension    Osteoarthritis    scoliosis, degenerative disc dz, knee, carpal tunnel   Sleep apnea    Past Surgical History:  Procedure Laterality Date   ABDOMINAL HYSTERECTOMY  07/2000   APPENDECTOMY  2000   APPENDECTOMY     BREAST BIOPSY Bilateral 2014   BREAST SURGERY Right 2015   lumpectomy   CARPAL TUNNEL RELEASE     COLONOSCOPY  2011   [name], [name]: Normal exam   COLONOSCOPY WITH PROPOFOL N/A 04/03/2020   Procedure: COLONOSCOPY WITH PROPOFOL;  Surgeon: Toledo, Benay Pike, MD;  Location:  [facility];  Service: Gastroenterology;  Laterality: N/A;   ESOPHAGOGASTRODUODENOSCOPY (EGD) WITH PROPOFOL N/A 04/03/2020   Procedure: ESOPHAGOGASTRODUODENOSCOPY (EGD) WITH PROPOFOL;  Surgeon: Toledo, Benay Pike, MD;  Location: [facility] END[facility]COPY;  Service: Gastroenterology;  Laterality: N/A;   HERNIA REPAIR  02/10/15   Laparoscopic Repair of Lateral Abdominal Wall Hernia  02/10/15   MASTECTOMY     UPPER GI ENDOSCOPY  05/10/2014   [name], [name] Bile gastritis, medium hiatal hernia. Incomplete visualization of the cardia and fundus.   Family History  Problem Relation Age of Onset   Diabetes Mother    Multiple myeloma Mother    Multiple myeloma Father    Diabetes Sister        x2   Diabetes Brother    Hypertension Brother    Dementia Brother    Social History   Socioeconomic History   Marital status: Married    Spouse name: Not on file   Number of children: 2   Years of education: Not on file   Highest education level: Not on file  Occupational History   Not on file  Tobacco Use   Smoking status: Never   Smokeless tobacco: Never  Vaping Use   Vaping Use: Never used  Substance and Sexual Activity   Alcohol  use: Yes    Alcohol/week: 0.0 standard drinks of alcohol    Comment: wine occasionally   Drug use: No   Sexual activity: Yes  Other Topics Concern   Not on file  Social History Narrative   Not on file   Social Determinants of Health   Financial Resource Strain: Low Risk  (06/09/2022)   Overall Financial Resource Strain (CARDIA)    Difficulty of Paying Living Expenses: Not hard at all  Food Insecurity: No Food Insecurity (06/09/2022)   Hunger Vital Sign    Worried About Running Out of Food in the Last Year: Never true    Ran Out of Food in the Last Year: Never true  Transportation Needs: No Transportation Needs (06/09/2022)   PRAPARE - Hydrologist (Medical): No    Lack of Transportation (Non-Medical): No  Physical Activity:  Insufficiently Active (06/09/2022)   Exercise Vital Sign    Days of Exercise per Week: 3 days    Minutes of Exercise per Session: 30 min  Stress: No Stress Concern Present (06/09/2022)   [place]    Feeling of Stress : Not at all  Social Connections: Unknown (06/09/2022)   Social Connection and Isolation Panel [NHANES]    Frequency of Communication with Friends and Family: Not on file    Frequency of Social Gatherings with Friends and Family: Not on file    Attends Religious Services: Not on file    Active Member of Clubs or Organizations: Not on file    Attends Archivist Meetings: Not on file    Marital Status: Married     Review of Systems  Constitutional:  Negative for fever.       Decreased appetite.   HENT:  Negative for congestion and sinus pressure.   Respiratory:  Positive for cough. Negative for chest tightness.        Coughing fits.   Cardiovascular:  Negative for chest pain, palpitations and leg swelling.  Gastrointestinal:  Negative for abdominal pain, diarrhea, nausea and vomiting.  Genitourinary:  Negative for difficulty urinating and dysuria.  Musculoskeletal:  Negative for joint swelling and myalgias.  Skin:  Negative for color change and rash.  Neurological:  Negative for dizziness, light-headedness and headaches.  Psychiatric/Behavioral:  Negative for agitation and dysphoric mood.        Objective:     BP (!) 128/58 (BP Location: Left Arm, Patient Position: Sitting, Cuff Size: Normal)   Pulse (!) 51   Temp 97.8 F (36.6 C) (Oral)   Resp 16   Ht 5' (1.524 m)   Wt 142 lb 3.2 oz (64.5 kg)   SpO2 97%   BMI 27.77 kg/m  Wt Readings from Last 3 Encounters:  10/04/22 142 lb 3.2 oz (64.5 kg)  09/22/22 142 lb (64.4 kg)  08/27/22 142 lb 3.2 oz (64.5 kg)    Physical Exam Vitals reviewed.  Constitutional:      General: She is not in acute distress.    Appearance: Normal  appearance.  HENT:     Head: Normocephalic and atraumatic.     Right Ear: External ear normal.     Left Ear: External ear normal.  Eyes:     General: No scleral icterus.       Right eye: No discharge.        Left eye: No discharge.     Conjunctiva/sclera: Conjunctivae normal.  Neck:     Thyroid:  No thyromegaly.  Cardiovascular:     Rate and Rhythm: Normal rate and regular rhythm.  Pulmonary:     Breath sounds: Normal breath sounds. No wheezing.     Comments: Increased cough with deep expiration.  Abdominal:     General: Bowel sounds are normal.     Palpations: Abdomen is soft.     Tenderness: There is no abdominal tenderness.  Musculoskeletal:        General: No swelling or tenderness.     Cervical back: Neck supple. No tenderness.  Lymphadenopathy:     Cervical: No cervical adenopathy.  Skin:    Findings: No erythema or rash.  Neurological:     Mental Status: She is alert.  Psychiatric:        Mood and Affect: Mood normal.        Behavior: Behavior normal.      Outpatient Encounter Medications as of 10/04/2022  Medication Sig   albuterol (VENTOLIN HFA) 108 (90 Base) MCG/ACT inhaler Inhale 2 puffs into the lungs every 6 (six) hours as needed for wheezing or shortness of breath.   aspirin 81 MG tablet Take 81 mg by mouth daily.   Azelastine HCl 137 MCG/SPRAY SOLN INSTILL TWO SPRAYS IN EACH NOSTRIL TWICE DAILY AS DIRECTED   [EXPIRED] azithromycin (ZITHROMAX) 250 MG tablet Take 2 tablets on day 1, then 1 tablet daily on days 2 through 5   benzonatate (TESSALON) 100 MG capsule Take 1 capsule (100 mg total) by mouth 3 (three) times daily as needed for cough.   calcium citrate-vitamin D 500-400 MG-UNIT chewable tablet Chew 1 tablet by mouth 2 (two) times daily.   cholecalciferol (VITAMIN D) 1000 UNITS tablet Take 2,000 Units by mouth in the morning and at bedtime.    fluticasone (FLONASE) 50 MCG/ACT nasal spray USE 2 SPRAYS IN EACH NOSTRIL EVERY DAY   gabapentin (NEURONTIN)  100 MG capsule TAKE 2 CAPSULES AT BEDTIME   irbesartan (AVAPRO) 75 MG tablet TAKE 1 TABLET EVERY DAY   levocetirizine (XYZAL) 5 MG tablet TAKE 1 TABLET EVERY EVENING   methocarbamol (ROBAXIN) 500 MG tablet Take 1 tablet (500 mg total) by mouth at bedtime as needed for muscle spasms. (Patient taking differently: Take 500 mg by mouth at bedtime as needed for muscle spasms. Pt taking prn)   Misc Natural Products (TART CHERRY ADVANCED) CAPS Take 1 capsule by mouth 2 (two) times daily. Reported on 03/04/2016   MULTIPLE MINERALS-VITAMINS PO    PEPPERMINT OIL PO Take by mouth.   predniSONE (DELTASONE) 10 MG tablet Take 6 tablets x 1 day and then decrease by 1/2 tablet per day until down to zero mg.   RABEprazole (ACIPHEX) 20 MG tablet Take 1 tablet (20 mg total) by mouth 2 (two) times daily.   rosuvastatin (CRESTOR) 5 MG tablet TAKE 1 TABLET (5 MG TOTAL) BY MOUTH 4 (FOUR) TIMES A WEEK.   sucralfate (CARAFATE) 1 g tablet Take 1 tablet (1 g total) by mouth 2 (two) times daily.   triamterene-hydrochlorothiazide (MAXZIDE-25) 37.5-25 MG tablet TAKE 1/2 TABLET EVERY DAY   [DISCONTINUED] nystatin cream (MYCOSTATIN) Apply 1 application topically 2 (two) times daily. (Patient not taking: Reported on 10/04/2022)   No facility-administered encounter medications on file as of 10/04/2022.     Lab Results  Component Value Date   WBC 5.2 12/07/2021   HGB 12.8 12/07/2021   HCT 37.9 12/07/2021   PLT 224.0 12/07/2021   GLUCOSE 89 08/24/2022   CHOL 165 08/24/2022   TRIG  98.0 08/24/2022   HDL 64.10 08/24/2022   LDLDIRECT 146.9 01/31/2014   LDLCALC 81 08/24/2022   ALT 16 08/24/2022   AST 18 08/24/2022   NA 138 08/24/2022   K 4.0 08/24/2022   CL 102 08/24/2022   CREATININE 0.94 08/24/2022   BUN 16 08/24/2022   CO2 26 08/24/2022   TSH 1.77 04/07/2022   HGBA1C 6.3 08/24/2022    US Abdomen Complete  Result Date: 09/06/2022 CLINICAL DATA:  Nausea.  Upper abdominal pain for a month. EXAM: ABDOMEN ULTRASOUND  COMPLETE COMPARISON:  None Available. FINDINGS: Gallbladder: No gallstones or wall thickening visualized. No sonographic Murphy sign noted by sonographer. Common bile duct: Diameter: 2.7 mm Liver: No focal lesion identified. Within normal limits in parenchymal echogenicity. Portal vein is patent on color Doppler imaging with normal direction of blood flow towards the liver. IVC: No abnormality visualized. Pancreas: Not visualized due to shadowing bowel gas. Spleen: Size and appearance within normal limits. Right Kidney: Length: 9.4 cm. Echogenicity within normal limits. No mass or hydronephrosis visualized. Left Kidney: Length: 8.9 cm. Echogenicity within normal limits. No mass or hydronephrosis visualized. Abdominal aorta: Mid abdominal aorta is not visualized due to shadowing bowel gas. Visualized abdominal aorta is nonaneurysmal. Other findings: None. IMPRESSION: 1. The pancreas and mid abdominal aorta were not visualized due to shadowing bowel gas. 2. No abnormalities are identified. No cause for nausea or upper abdominal pain are identified. Electronically Signed   By: Dorise Bullion III [name]   On: 09/06/2022 11:15       Assessment & Plan:   Problem List Items Addressed This Visit     GERD (gastroesophageal reflux disease)    Has a large paraesophageal hernia.  On aciphex and carafate.  Has seen GI.  Recently evaluated by Dr Dianah Field.  Elected watchful waiting.        Hypercholesterolemia    Continue crestor.  Low cholesterol diet and exercise.  Follow lipid panel and liver function tests.        Hyperglycemia    Low carb diet and exercise.  Follow met b and a1c.        Hypertension    Continue avapro and triam/hctz.  Blood pressures as outlined.  Follow pressures.  Follow metabolic panel.       Obstructive sleep apnea    Pt is using cpap regularly and is benefiting from continued use.  ([facility])      Paraesophageal hiatal hernia    Saw Dianah Field.  Elected watchful  waiting.        Persistent cough    Persistent increased cough as outlined.  Coughing fits. Will obtain nasal swab to check for covid/rsv/flu.  Check cxr.  Treat with zpak and prednisone taper as directed.  Albuterol inhaler if needed.  Follow closely.  Call with update.       Relevant Orders   COVID-19, Flu A+B and RSV (Completed)   DG Chest 2 View (Completed)     Einar Pheasant, MD

## 2022-10-05 ENCOUNTER — Telehealth: Payer: Self-pay

## 2022-10-05 NOTE — Telephone Encounter (Signed)
LMTCB for xray results.  °

## 2022-10-06 LAB — COVID-19, FLU A+B AND RSV
Influenza A, NAA: NOT DETECTED
Influenza B, NAA: NOT DETECTED
RSV, NAA: NOT DETECTED
SARS-CoV-2, NAA: NOT DETECTED

## 2022-10-10 ENCOUNTER — Encounter: Payer: Self-pay | Admitting: Internal Medicine

## 2022-10-10 NOTE — Assessment & Plan Note (Signed)
Continue avapro and triam/hctz.  Blood pressures as outlined.  Follow pressures.  Follow metabolic panel.  

## 2022-10-10 NOTE — Assessment & Plan Note (Signed)
Saw Carla Ryan.  Elected watchful waiting.

## 2022-10-10 NOTE — Assessment & Plan Note (Signed)
Pt is using cpap regularly and is benefiting from continued use.  ([facility]) 

## 2022-10-10 NOTE — Assessment & Plan Note (Signed)
Persistent increased cough as outlined.  Coughing fits. Will obtain nasal swab to check for covid/rsv/flu.  Check cxr.  Treat with zpak and prednisone taper as directed.  Albuterol inhaler if needed.  Follow closely.  Call with update.

## 2022-10-10 NOTE — Assessment & Plan Note (Signed)
Has a large paraesophageal hernia.  On aciphex and carafate.  Has seen GI.  Recently evaluated by Dr Tim Farrell.  Elected watchful waiting.   

## 2022-10-10 NOTE — Assessment & Plan Note (Signed)
Low carb diet and exercise.  Follow met b and a1c.   

## 2022-10-10 NOTE — Assessment & Plan Note (Signed)
Continue crestor.  Low cholesterol diet and exercise. Follow lipid panel and liver function tests.   

## 2022-10-26 ENCOUNTER — Ambulatory Visit (INDEPENDENT_AMBULATORY_CARE_PROVIDER_SITE_OTHER): Payer: Medicare HMO

## 2022-10-26 DIAGNOSIS — Z23 Encounter for immunization: Secondary | ICD-10-CM | POA: Diagnosis not present

## 2022-10-26 NOTE — Progress Notes (Signed)
Patient came in today for high dose flu shot given in left deltoid IM. Pt tolerated well with no signs of distress.

## 2022-11-02 DIAGNOSIS — M48062 Spinal stenosis, lumbar region with neurogenic claudication: Secondary | ICD-10-CM | POA: Diagnosis not present

## 2022-11-02 DIAGNOSIS — M5416 Radiculopathy, lumbar region: Secondary | ICD-10-CM | POA: Diagnosis not present

## 2022-11-02 DIAGNOSIS — M419 Scoliosis, unspecified: Secondary | ICD-10-CM | POA: Diagnosis not present

## 2022-11-02 DIAGNOSIS — M5030 Other cervical disc degeneration, unspecified cervical region: Secondary | ICD-10-CM | POA: Diagnosis not present

## 2022-11-02 DIAGNOSIS — M5136 Other intervertebral disc degeneration, lumbar region: Secondary | ICD-10-CM | POA: Diagnosis not present

## 2022-11-06 ENCOUNTER — Other Ambulatory Visit: Payer: Self-pay | Admitting: Internal Medicine

## 2022-11-25 ENCOUNTER — Other Ambulatory Visit: Payer: Self-pay | Admitting: Internal Medicine

## 2022-11-30 DIAGNOSIS — M5126 Other intervertebral disc displacement, lumbar region: Secondary | ICD-10-CM | POA: Diagnosis not present

## 2022-11-30 DIAGNOSIS — M5416 Radiculopathy, lumbar region: Secondary | ICD-10-CM | POA: Diagnosis not present

## 2022-11-30 DIAGNOSIS — M48062 Spinal stenosis, lumbar region with neurogenic claudication: Secondary | ICD-10-CM | POA: Diagnosis not present

## 2022-12-06 ENCOUNTER — Other Ambulatory Visit: Payer: Self-pay | Admitting: Internal Medicine

## 2022-12-16 DIAGNOSIS — M5412 Radiculopathy, cervical region: Secondary | ICD-10-CM | POA: Diagnosis not present

## 2022-12-16 DIAGNOSIS — M4802 Spinal stenosis, cervical region: Secondary | ICD-10-CM | POA: Diagnosis not present

## 2022-12-29 ENCOUNTER — Other Ambulatory Visit (INDEPENDENT_AMBULATORY_CARE_PROVIDER_SITE_OTHER): Payer: Medicare HMO

## 2022-12-29 DIAGNOSIS — E7800 Pure hypercholesterolemia, unspecified: Secondary | ICD-10-CM

## 2022-12-29 DIAGNOSIS — I10 Essential (primary) hypertension: Secondary | ICD-10-CM

## 2022-12-29 DIAGNOSIS — R739 Hyperglycemia, unspecified: Secondary | ICD-10-CM

## 2022-12-29 LAB — CBC WITH DIFFERENTIAL/PLATELET
Basophils Absolute: 0 10*3/uL (ref 0.0–0.1)
Basophils Relative: 0.5 % (ref 0.0–3.0)
Eosinophils Absolute: 0.3 10*3/uL (ref 0.0–0.7)
Eosinophils Relative: 4.4 % (ref 0.0–5.0)
HCT: 40 % (ref 36.0–46.0)
Hemoglobin: 13.4 g/dL (ref 12.0–15.0)
Lymphocytes Relative: 28.1 % (ref 12.0–46.0)
Lymphs Abs: 1.6 10*3/uL (ref 0.7–4.0)
MCHC: 33.4 g/dL (ref 30.0–36.0)
MCV: 93.2 fl (ref 78.0–100.0)
Monocytes Absolute: 0.5 10*3/uL (ref 0.1–1.0)
Monocytes Relative: 8.2 % (ref 3.0–12.0)
Neutro Abs: 3.4 10*3/uL (ref 1.4–7.7)
Neutrophils Relative %: 58.8 % (ref 43.0–77.0)
Platelets: 275 10*3/uL (ref 150.0–400.0)
RBC: 4.29 Mil/uL (ref 3.87–5.11)
RDW: 14.9 % (ref 11.5–15.5)
WBC: 5.8 10*3/uL (ref 4.0–10.5)

## 2022-12-29 LAB — LIPID PANEL
Cholesterol: 204 mg/dL — ABNORMAL HIGH (ref 0–200)
HDL: 77.1 mg/dL (ref >39.00)
LDL Cholesterol: 105 mg/dL — ABNORMAL HIGH (ref 0–99)
NonHDL: 126.48
Total CHOL/HDL Ratio: 3
Triglycerides: 106 mg/dL (ref 0.0–149.0)
VLDL: 21.2 mg/dL (ref 0.0–40.0)

## 2022-12-29 LAB — BASIC METABOLIC PANEL
BUN: 23 mg/dL (ref 6–23)
CO2: 29 mEq/L (ref 19–32)
Calcium: 9.6 mg/dL (ref 8.4–10.5)
Chloride: 103 mEq/L (ref 96–112)
Creatinine, Ser: 0.87 mg/dL (ref 0.40–1.20)
GFR: 63.6 mL/min (ref >60.00)
Glucose, Bld: 92 mg/dL (ref 70–99)
Potassium: 4.2 mEq/L (ref 3.5–5.1)
Sodium: 140 mEq/L (ref 135–145)

## 2022-12-29 LAB — HEPATIC FUNCTION PANEL
ALT: 17 U/L (ref 0–35)
AST: 16 U/L (ref 0–37)
Albumin: 4.1 g/dL (ref 3.5–5.2)
Alkaline Phosphatase: 52 U/L (ref 39–117)
Bilirubin, Direct: 0.1 mg/dL (ref 0.0–0.3)
Total Bilirubin: 0.6 mg/dL (ref 0.2–1.2)
Total Protein: 6.1 g/dL (ref 6.0–8.3)

## 2022-12-29 LAB — HEMOGLOBIN A1C: Hgb A1c MFr Bld: 6.4 % (ref 4.6–6.5)

## 2023-01-03 ENCOUNTER — Encounter: Payer: Medicare HMO | Admitting: Internal Medicine

## 2023-01-06 DIAGNOSIS — M5412 Radiculopathy, cervical region: Secondary | ICD-10-CM | POA: Diagnosis not present

## 2023-01-06 DIAGNOSIS — M48062 Spinal stenosis, lumbar region with neurogenic claudication: Secondary | ICD-10-CM | POA: Diagnosis not present

## 2023-01-06 DIAGNOSIS — M5136 Other intervertebral disc degeneration, lumbar region: Secondary | ICD-10-CM | POA: Diagnosis not present

## 2023-01-06 DIAGNOSIS — M5126 Other intervertebral disc displacement, lumbar region: Secondary | ICD-10-CM | POA: Diagnosis not present

## 2023-01-06 DIAGNOSIS — M5416 Radiculopathy, lumbar region: Secondary | ICD-10-CM | POA: Diagnosis not present

## 2023-01-06 DIAGNOSIS — M4802 Spinal stenosis, cervical region: Secondary | ICD-10-CM | POA: Diagnosis not present

## 2023-01-06 DIAGNOSIS — M5030 Other cervical disc degeneration, unspecified cervical region: Secondary | ICD-10-CM | POA: Diagnosis not present

## 2023-01-18 DIAGNOSIS — H2513 Age-related nuclear cataract, bilateral: Secondary | ICD-10-CM | POA: Diagnosis not present

## 2023-01-18 DIAGNOSIS — Z0100 Encounter for examination of eyes and vision without abnormal findings: Secondary | ICD-10-CM | POA: Diagnosis not present

## 2023-01-18 DIAGNOSIS — H353131 Nonexudative age-related macular degeneration, bilateral, early dry stage: Secondary | ICD-10-CM | POA: Diagnosis not present

## 2023-01-18 DIAGNOSIS — H43813 Vitreous degeneration, bilateral: Secondary | ICD-10-CM | POA: Diagnosis not present

## 2023-01-27 ENCOUNTER — Ambulatory Visit (INDEPENDENT_AMBULATORY_CARE_PROVIDER_SITE_OTHER): Payer: Medicare HMO | Admitting: Internal Medicine

## 2023-01-27 ENCOUNTER — Encounter: Payer: Self-pay | Admitting: Internal Medicine

## 2023-01-27 VITALS — BP 132/70 | HR 66 | Temp 97.9°F | Resp 16 | Ht 60.0 in | Wt 138.6 lb

## 2023-01-27 DIAGNOSIS — R079 Chest pain, unspecified: Secondary | ICD-10-CM | POA: Diagnosis not present

## 2023-01-27 DIAGNOSIS — I351 Nonrheumatic aortic (valve) insufficiency: Secondary | ICD-10-CM | POA: Diagnosis not present

## 2023-01-27 DIAGNOSIS — F439 Reaction to severe stress, unspecified: Secondary | ICD-10-CM

## 2023-01-27 DIAGNOSIS — G4733 Obstructive sleep apnea (adult) (pediatric): Secondary | ICD-10-CM

## 2023-01-27 DIAGNOSIS — Z853 Personal history of malignant neoplasm of breast: Secondary | ICD-10-CM

## 2023-01-27 DIAGNOSIS — R739 Hyperglycemia, unspecified: Secondary | ICD-10-CM | POA: Diagnosis not present

## 2023-01-27 DIAGNOSIS — K219 Gastro-esophageal reflux disease without esophagitis: Secondary | ICD-10-CM

## 2023-01-27 DIAGNOSIS — R0602 Shortness of breath: Secondary | ICD-10-CM | POA: Diagnosis not present

## 2023-01-27 DIAGNOSIS — E7800 Pure hypercholesterolemia, unspecified: Secondary | ICD-10-CM | POA: Diagnosis not present

## 2023-01-27 DIAGNOSIS — I10 Essential (primary) hypertension: Secondary | ICD-10-CM | POA: Diagnosis not present

## 2023-01-27 DIAGNOSIS — Z0000 Encounter for general adult medical examination without abnormal findings: Secondary | ICD-10-CM

## 2023-01-27 DIAGNOSIS — M545 Low back pain, unspecified: Secondary | ICD-10-CM | POA: Diagnosis not present

## 2023-01-27 MED ORDER — ROSUVASTATIN CALCIUM 5 MG PO TABS: 5.0000 mg | ORAL_TABLET | Freq: Every day | ORAL | 1 refills | Status: DC

## 2023-01-27 NOTE — Assessment & Plan Note (Signed)
Physical today 01/27/23.  Colonoscopy 09/29/18 -  Negative.   Mammogram - [facility] - 02/22/22 - Birads II. Bone density scheduled at [facility].

## 2023-01-27 NOTE — Progress Notes (Signed)
Subjective:    Patient ID: Carla Ryan, female    DOB: 1946-12-15, 77 y.o.   MRN: [id number]  Patient here for  Chief Complaint  Patient presents with   Annual Exam    HPI Here for physical exam. Planning cataract surgery. Scheduled for pre op 02/07/23.  During visit, she reports noticing intermittent right side chest pain - over the last few months.  No specific trigger.  No sob.  No increased cough or congestion.  Tries to stay active.  No increased acid reflux reported.  No abdominal pain.  Noticed felt a little queezy last night.  No vomiting.  Feels ok today.  Fiber helps bowels. Using cpap.  Seeing Dr Sherlynn Carbon - f/u neck and back pain.  S/p ESI.  Last evaluated 01/06/23 - improved. Recommended continuing tylenol, gabapentin.  Trial of tramadol.  Scheduled for ESI in March.   Past Medical History:  Diagnosis Date   Abdominal hernia    Anxiety    situational stress and anxiety   Cancer ([place]) 2015   breast    Cataract cortical, senile, unspecified laterality    Chest pain    Exertional dyspnea    GERD (gastroesophageal reflux disease)    Hiatal hernia    Hiatal hernia    Hypercholesterolemia    Hyperlipidemia    Hypertension    Osteoarthritis    scoliosis, degenerative disc dz, knee, carpal tunnel   Sleep apnea    Past Surgical History:  Procedure Laterality Date   ABDOMINAL HYSTERECTOMY  07/2000   APPENDECTOMY  2000   APPENDECTOMY     BREAST BIOPSY Bilateral 2014   BREAST SURGERY Right 2015   lumpectomy   CARPAL TUNNEL RELEASE     COLONOSCOPY  2011   [name], [name]: Normal exam   COLONOSCOPY WITH PROPOFOL N/A 04/03/2020   Procedure: COLONOSCOPY WITH PROPOFOL;  Surgeon: Toledo, Benay Pike, MD;  Location: [facility];  Service: Gastroenterology;  Laterality: N/A;   ESOPHAGOGASTRODUODENOSCOPY (EGD) WITH PROPOFOL N/A 04/03/2020   Procedure: ESOPHAGOGASTRODUODENOSCOPY (EGD) WITH PROPOFOL;  Surgeon: Toledo, Benay Pike, MD;  Location: [facility];   Service: Gastroenterology;  Laterality: N/A;   HERNIA REPAIR  02/10/15   Laparoscopic Repair of Lateral Abdominal Wall Hernia  02/10/15   MASTECTOMY     UPPER GI ENDOSCOPY  05/10/2014   [name], [name] Bile gastritis, medium hiatal hernia. Incomplete visualization of the cardia and fundus.   Family History  Problem Relation Age of Onset   Diabetes Mother    Multiple myeloma Mother    Multiple myeloma Father    Diabetes Sister        x2   Diabetes Brother    Hypertension Brother    Dementia Brother    Social History   Socioeconomic History   Marital status: Married    Spouse name: Not on file   Number of children: 2   Years of education: Not on file   Highest education level: Not on file  Occupational History   Not on file  Tobacco Use   Smoking status: Never   Smokeless tobacco: Never  Vaping Use   Vaping Use: Never used  Substance and Sexual Activity   Alcohol use: Yes    Alcohol/week: 0.0 standard drinks of alcohol    Comment: wine occasionally   Drug use: No   Sexual activity: Yes  Other Topics Concern   Not on file  Social History Narrative   Not on file   Social Determinants of  Health   Financial Resource Strain: Low Risk  (06/09/2022)   Overall Financial Resource Strain (CARDIA)    Difficulty of Paying Living Expenses: Not hard at all  Food Insecurity: No Food Insecurity (06/09/2022)   Hunger Vital Sign    Worried About Running Out of Food in the Last Year: Never true    Ran Out of Food in the Last Year: Never true  Transportation Needs: No Transportation Needs (06/09/2022)   PRAPARE - Hydrologist (Medical): No    Lack of Transportation (Non-Medical): No  Physical Activity: Insufficiently Active (06/09/2022)   Exercise Vital Sign    Days of Exercise per Week: 3 days    Minutes of Exercise per Session: 30 min  Stress: No Stress Concern Present (06/09/2022)   [place]    Feeling of Stress : Not at all  Social Connections: Unknown (06/09/2022)   Social Connection and Isolation Panel [NHANES]    Frequency of Communication with Friends and Family: Not on file    Frequency of Social Gatherings with Friends and Family: Not on file    Attends Religious Services: Not on file    Active Member of Clubs or Organizations: Not on file    Attends Archivist Meetings: Not on file    Marital Status: Married     Review of Systems  Constitutional:  Negative for appetite change and unexpected weight change.  HENT:  Negative for congestion and sinus pressure.   Respiratory:  Negative for cough, chest tightness and shortness of breath.   Cardiovascular:  Positive for chest pain. Negative for palpitations and leg swelling.  Gastrointestinal:  Negative for abdominal pain, diarrhea, nausea and vomiting.  Genitourinary:  Negative for difficulty urinating and dysuria.  Musculoskeletal:  Positive for back pain. Negative for joint swelling and myalgias.  Skin:  Negative for color change and rash.  Neurological:  Negative for dizziness, light-headedness and headaches.  Psychiatric/Behavioral:  Negative for agitation and dysphoric mood.        Objective:     BP 132/70   Pulse 66   Temp 97.9 F (36.6 C)   Resp 16   Ht 5' (1.524 m)   Wt 138 lb 9.6 oz (62.9 kg)   SpO2 97%   BMI 27.07 kg/m  Wt Readings from Last 3 Encounters:  01/27/23 138 lb 9.6 oz (62.9 kg)  10/04/22 142 lb 3.2 oz (64.5 kg)  09/22/22 142 lb (64.4 kg)    Physical Exam Vitals reviewed.  Constitutional:      General: She is not in acute distress.    Appearance: Normal appearance. She is well-developed.  HENT:     Head: Normocephalic and atraumatic.     Right Ear: External ear normal.     Left Ear: External ear normal.  Eyes:     General: No scleral icterus.       Right eye: No discharge.        Left eye: No discharge.     Conjunctiva/sclera: Conjunctivae normal.   Neck:     Thyroid: No thyromegaly.  Cardiovascular:     Rate and Rhythm: Normal rate and regular rhythm.  Pulmonary:     Effort: No tachypnea, accessory muscle usage or respiratory distress.     Breath sounds: Normal breath sounds. No decreased breath sounds or wheezing.  Chest:  Breasts:    Right: No inverted nipple, mass, nipple discharge or tenderness (no axillary adenopathy).  Left: No inverted nipple, mass, nipple discharge or tenderness (no axilarry adenopathy).  Abdominal:     General: Bowel sounds are normal.     Palpations: Abdomen is soft.     Tenderness: There is no abdominal tenderness.  Musculoskeletal:        General: No swelling or tenderness.     Cervical back: Neck supple.  Lymphadenopathy:     Cervical: No cervical adenopathy.  Skin:    Findings: No erythema or rash.  Neurological:     Mental Status: She is alert and oriented to person, place, and time.  Psychiatric:        Mood and Affect: Mood normal.        Behavior: Behavior normal.      Outpatient Encounter Medications as of 01/27/2023  Medication Sig   Ascorbic Acid (VITAMIN C) 1000 MG tablet Take 1,000 mg by mouth daily.   Magnesium Glycinate 100 MG CAPS Take 200 mg by mouth daily.   aspirin 81 MG tablet Take 81 mg by mouth daily.   Azelastine HCl 137 MCG/SPRAY SOLN INSTILL TWO SPRAYS IN EACH NOSTRIL TWICE DAILY AS DIRECTED   calcium citrate-vitamin D 500-400 MG-UNIT chewable tablet Chew 1 tablet by mouth 2 (two) times daily.   cholecalciferol (VITAMIN D) 1000 UNITS tablet Take 2,000 Units by mouth in the morning and at bedtime.    fluticasone (FLONASE) 50 MCG/ACT nasal spray USE 2 SPRAYS IN EACH NOSTRIL EVERY DAY   gabapentin (NEURONTIN) 100 MG capsule TAKE 2 CAPSULES AT BEDTIME   irbesartan (AVAPRO) 75 MG tablet TAKE 1 TABLET EVERY DAY   levocetirizine (XYZAL) 5 MG tablet TAKE 1 TABLET EVERY EVENING   methocarbamol (ROBAXIN) 500 MG tablet Take 1 tablet (500 mg total) by mouth at bedtime as  needed for muscle spasms. (Patient taking differently: Take 500 mg by mouth at bedtime as needed for muscle spasms. Pt taking prn)   Misc Natural Products (TART CHERRY ADVANCED) CAPS Take 1 capsule by mouth 2 (two) times daily. Reported on 03/04/2016   MULTIPLE MINERALS-VITAMINS PO    PEPPERMINT OIL PO Take by mouth.   RABEprazole (ACIPHEX) 20 MG tablet Take 1 tablet (20 mg total) by mouth 2 (two) times daily.   rosuvastatin (CRESTOR) 5 MG tablet Take 1 tablet (5 mg total) by mouth daily.   sucralfate (CARAFATE) 1 g tablet Take 1 tablet (1 g total) by mouth 2 (two) times daily.   triamterene-hydrochlorothiazide (MAXZIDE-25) 37.5-25 MG tablet TAKE 1/2 TABLET EVERY DAY   [DISCONTINUED] albuterol (VENTOLIN HFA) 108 (90 Base) MCG/ACT inhaler Inhale 2 puffs into the lungs every 6 (six) hours as needed for wheezing or shortness of breath.   [DISCONTINUED] benzonatate (TESSALON) 100 MG capsule Take 1 capsule (100 mg total) by mouth 3 (three) times daily as needed for cough.   [DISCONTINUED] predniSONE (DELTASONE) 10 MG tablet Take 6 tablets x 1 day and then decrease by 1/2 tablet per day until down to zero mg.   [DISCONTINUED] rosuvastatin (CRESTOR) 5 MG tablet TAKE 1 TABLET FOUR TIMES A WEEK.   [DISCONTINUED] rosuvastatin (CRESTOR) 5 MG tablet Take 1 tablet (5 mg total) by mouth daily.   No facility-administered encounter medications on file as of 01/27/2023.     Lab Results  Component Value Date   WBC 5.8 12/29/2022   HGB 13.4 12/29/2022   HCT 40.0 12/29/2022   PLT 275.0 12/29/2022   GLUCOSE 92 12/29/2022   CHOL 204 (H) 12/29/2022   TRIG 106.0 12/29/2022   HDL  77.10 12/29/2022   LDLDIRECT 146.9 01/31/2014   LDLCALC 105 (H) 12/29/2022   ALT 17 12/29/2022   AST 16 12/29/2022   NA 140 12/29/2022   K 4.2 12/29/2022   CL 103 12/29/2022   CREATININE 0.87 12/29/2022   BUN 23 12/29/2022   CO2 29 12/29/2022   TSH 1.77 04/07/2022   HGBA1C 6.4 12/29/2022    US Abdomen Complete  Result Date:  09/06/2022 CLINICAL DATA:  Nausea.  Upper abdominal pain for a month. EXAM: ABDOMEN ULTRASOUND COMPLETE COMPARISON:  None Available. FINDINGS: Gallbladder: No gallstones or wall thickening visualized. No sonographic Murphy sign noted by sonographer. Common bile duct: Diameter: 2.7 mm Liver: No focal lesion identified. Within normal limits in parenchymal echogenicity. Portal vein is patent on color Doppler imaging with normal direction of blood flow towards the liver. IVC: No abnormality visualized. Pancreas: Not visualized due to shadowing bowel gas. Spleen: Size and appearance within normal limits. Right Kidney: Length: 9.4 cm. Echogenicity within normal limits. No mass or hydronephrosis visualized. Left Kidney: Length: 8.9 cm. Echogenicity within normal limits. No mass or hydronephrosis visualized. Abdominal aorta: Mid abdominal aorta is not visualized due to shadowing bowel gas. Visualized abdominal aorta is nonaneurysmal. Other findings: None. IMPRESSION: 1. The pancreas and mid abdominal aorta were not visualized due to shadowing bowel gas. 2. No abnormalities are identified. No cause for nausea or upper abdominal pain are identified. Electronically Signed   By: Dorise Bullion III [name]   On: 09/06/2022 11:15       Assessment & Plan:  Routine general medical examination at a health care facility  Health care maintenance Assessment & Plan: Physical today 01/27/23.  Colonoscopy 09/29/18 -  Negative.   Mammogram - [facility] - 02/22/22 - Birads II. Bone density scheduled at [facility].    Hyperglycemia Assessment & Plan: Low carb diet and exercise.  Follow met b and a1c.    Orders: -     Hemoglobin A1c; Future  Hypercholesterolemia Assessment & Plan: Continue crestor.  Low cholesterol diet and exercise.  Follow lipid panel and liver function tests.    Orders: -     Lipid panel; Future -     Hepatic function panel; Future  Primary hypertension Assessment & Plan: Continue avapro and triam/hctz.  Blood  pressures as outlined.  Follow pressures.  Follow metabolic panel.   Orders: -     Basic metabolic panel; Future -     TSH; Future  Chest pain, unspecified type Assessment & Plan: Previously saw cardiology. Previous stress test negative for ischemia.  With intermittent chest pain.  Discussed somewhat atypical, but given persistent intermittent pain - EKG.  EKG - SR with non specific ST/T changes.  Given upcoming surgery will have cardiology review and confirm if any further w/up warranted.    Orders: -     EKG 12-Lead  Low back pain, unspecified back pain laterality, unspecified chronicity, unspecified whether sciatica present Assessment & Plan: Seeing Dr Sherlynn Carbon - f/u neck and back pain.  S/p ESI.  Last evaluated 01/06/23 - improved. Recommended continuing tylenol, gabapentin.  Trial of tramadol.  Scheduled for ESI in March.    Gastroesophageal reflux disease without esophagitis Assessment & Plan: Has a large paraesophageal hernia.  On aciphex and carafate.  Has seen GI.  Recently evaluated by Dr Dianah Field.  Elected watchful waiting.     History of breast cancer Assessment & Plan: Mammogram 02/22/22 - Birads II.    Obstructive sleep apnea Assessment & Plan: Pt is  using cpap regularly and is benefiting from continued use.  ([employer])   Stress Assessment & Plan: Overall appears to be handling things relatively well.  Follow.    Other orders -     Rosuvastatin Calcium; Take 1 tablet (5 mg total) by mouth daily.  Dispense: 90 tablet; Refill: 1     Einar Pheasant, MD

## 2023-01-30 ENCOUNTER — Encounter: Payer: Self-pay | Admitting: Internal Medicine

## 2023-01-30 NOTE — Assessment & Plan Note (Signed)
Pt is using cpap regularly and is benefiting from continued use.  ([employer])

## 2023-01-30 NOTE — Assessment & Plan Note (Signed)
Has a large paraesophageal hernia.  On aciphex and carafate.  Has seen GI.  Recently evaluated by Dr Dianah Field.  Elected watchful waiting.

## 2023-01-30 NOTE — Assessment & Plan Note (Signed)
Overall appears to be handling things relatively well.  Follow.   

## 2023-01-30 NOTE — Assessment & Plan Note (Signed)
Low carb diet and exercise.  Follow met b and a1c.   

## 2023-01-30 NOTE — Assessment & Plan Note (Signed)
Continue avapro and triam/hctz.  Blood pressures as outlined.  Follow pressures.  Follow metabolic panel.

## 2023-01-30 NOTE — Assessment & Plan Note (Signed)
Mammogram 02/22/22 - Birads II.

## 2023-01-30 NOTE — Assessment & Plan Note (Signed)
Continue crestor.  Low cholesterol diet and exercise. Follow lipid panel and liver function tests.   

## 2023-01-30 NOTE — Assessment & Plan Note (Signed)
Seeing Dr Sherlynn Carbon - f/u neck and back pain.  S/p ESI.  Last evaluated 01/06/23 - improved. Recommended continuing tylenol, gabapentin.  Trial of tramadol.  Scheduled for ESI in March.

## 2023-01-30 NOTE — Assessment & Plan Note (Signed)
Previously saw cardiology. Previous stress test negative for ischemia.  With intermittent chest pain.  Discussed somewhat atypical, but given persistent intermittent pain - EKG.  EKG - SR with non specific ST/T changes.  Given upcoming surgery will have cardiology review and confirm if any further w/up warranted.

## 2023-02-07 DIAGNOSIS — H2512 Age-related nuclear cataract, left eye: Secondary | ICD-10-CM | POA: Diagnosis not present

## 2023-02-07 DIAGNOSIS — H2511 Age-related nuclear cataract, right eye: Secondary | ICD-10-CM | POA: Diagnosis not present

## 2023-02-09 DIAGNOSIS — I351 Nonrheumatic aortic (valve) insufficiency: Secondary | ICD-10-CM | POA: Diagnosis not present

## 2023-02-09 DIAGNOSIS — R0602 Shortness of breath: Secondary | ICD-10-CM | POA: Diagnosis not present

## 2023-02-09 DIAGNOSIS — R079 Chest pain, unspecified: Secondary | ICD-10-CM | POA: Diagnosis not present

## 2023-02-10 ENCOUNTER — Encounter: Payer: Self-pay | Admitting: Ophthalmology

## 2023-02-14 NOTE — Discharge Instructions (Signed)

## 2023-02-16 ENCOUNTER — Ambulatory Visit: Payer: Medicare HMO | Admitting: General Practice

## 2023-02-16 ENCOUNTER — Encounter
Admission: RE | Disposition: A | Discharge status: Home / Self Care | Payer: Self-pay | Source: Home / Self Care | Attending: Ophthalmology

## 2023-02-16 ENCOUNTER — Encounter: Payer: Self-pay | Admitting: Ophthalmology

## 2023-02-16 ENCOUNTER — Other Ambulatory Visit: Payer: Self-pay

## 2023-02-16 ENCOUNTER — Ambulatory Visit
Admission: RE | Admit: 2023-02-16 | Discharge: 2023-02-16 | Disposition: A | Discharge status: Home / Self Care | Payer: Medicare HMO | Attending: Ophthalmology | Admitting: Ophthalmology

## 2023-02-16 DIAGNOSIS — G4730 Sleep apnea, unspecified: Secondary | ICD-10-CM | POA: Insufficient documentation

## 2023-02-16 DIAGNOSIS — G4733 Obstructive sleep apnea (adult) (pediatric): Secondary | ICD-10-CM | POA: Diagnosis not present

## 2023-02-16 DIAGNOSIS — I10 Essential (primary) hypertension: Secondary | ICD-10-CM | POA: Diagnosis not present

## 2023-02-16 DIAGNOSIS — E7800 Pure hypercholesterolemia, unspecified: Secondary | ICD-10-CM | POA: Diagnosis not present

## 2023-02-16 DIAGNOSIS — K219 Gastro-esophageal reflux disease without esophagitis: Secondary | ICD-10-CM | POA: Insufficient documentation

## 2023-02-16 DIAGNOSIS — H2511 Age-related nuclear cataract, right eye: Secondary | ICD-10-CM | POA: Insufficient documentation

## 2023-02-16 DIAGNOSIS — H2512 Age-related nuclear cataract, left eye: Secondary | ICD-10-CM | POA: Diagnosis not present

## 2023-02-16 HISTORY — PX: CATARACT EXTRACTION W/PHACO: SHX586

## 2023-02-16 HISTORY — DX: Nausea with vomiting, unspecified: R11.2

## 2023-02-16 HISTORY — DX: Other specified postprocedural states: Z98.890

## 2023-02-16 SURGERY — PHACOEMULSIFICATION, CATARACT, WITH IOL INSERTION
Anesthesia: Monitor Anesthesia Care | Site: Eye | Laterality: Right

## 2023-02-16 MED ORDER — CEFUROXIME OPHTHALMIC INJECTION 1 MG/0.1 ML
INJECTION | OPHTHALMIC | Status: DC | PRN
  Administered 2023-02-16: .1 mL via INTRACAMERAL

## 2023-02-16 MED ORDER — TETRACAINE HCL 0.5 % OP SOLN
1.0000 [drp] | OPHTHALMIC | Status: DC | PRN
  Administered 2023-02-16 (×3): 1 [drp] via OPHTHALMIC

## 2023-02-16 MED ORDER — LACTATED RINGERS IV SOLN: INTRAVENOUS | Status: DC

## 2023-02-16 MED ORDER — SIGHTPATH DOSE#1 BSS IO SOLN
INTRAOCULAR | Status: DC | PRN
  Administered 2023-02-16: 1 mL via INTRAMUSCULAR

## 2023-02-16 MED ORDER — SIGHTPATH DOSE#1 NA HYALUR & NA CHOND-NA HYALUR IO KIT
PACK | INTRAOCULAR | Status: DC | PRN
  Administered 2023-02-16: 1 via OPHTHALMIC

## 2023-02-16 MED ORDER — MIDAZOLAM HCL 2 MG/2ML IJ SOLN
INTRAMUSCULAR | Status: DC | PRN
  Administered 2023-02-16: 1 mg via INTRAVENOUS

## 2023-02-16 MED ORDER — SIGHTPATH DOSE#1 BSS IO SOLN
INTRAOCULAR | Status: DC | PRN
  Administered 2023-02-16: 59 mL via OPHTHALMIC

## 2023-02-16 MED ORDER — SIGHTPATH DOSE#1 BSS IO SOLN
INTRAOCULAR | Status: DC | PRN
  Administered 2023-02-16: 15 mL

## 2023-02-16 MED ORDER — BRIMONIDINE TARTRATE-TIMOLOL 0.2-0.5 % OP SOLN
OPHTHALMIC | Status: DC | PRN
  Administered 2023-02-16: 1 [drp] via OPHTHALMIC

## 2023-02-16 MED ORDER — ARMC OPHTHALMIC DILATING DROPS
1.0000 | OPHTHALMIC | Status: DC | PRN
  Administered 2023-02-16 (×3): 1 via OPHTHALMIC

## 2023-02-16 MED ORDER — FENTANYL CITRATE (PF) 100 MCG/2ML IJ SOLN
INTRAMUSCULAR | Status: DC | PRN
  Administered 2023-02-16: 50 ug via INTRAVENOUS

## 2023-02-16 SURGICAL SUPPLY — 10 items
CATARACT SUITE SIGHTPATH (MISCELLANEOUS) ×1 IMPLANT
FEE CATARACT SUITE SIGHTPATH (MISCELLANEOUS) ×1 IMPLANT
GLOVE SRG 8 PF TXTR STRL LF DI (GLOVE) ×1 IMPLANT
GLOVE SURG ENC TEXT LTX SZ7.5 (GLOVE) ×1 IMPLANT
GLOVE SURG UNDER POLY LF SZ8 (GLOVE) ×1
LENS IOL TECNIS EYHANCE 21.5 (Intraocular Lens) IMPLANT
NDL FILTER BLUNT 18X1 1/2 (NEEDLE) ×1 IMPLANT
NEEDLE FILTER BLUNT 18X1 1/2 (NEEDLE) ×1 IMPLANT
SYR 3ML LL SCALE MARK (SYRINGE) ×1 IMPLANT
WATER STERILE IRR 250ML POUR (IV SOLUTION) ×1 IMPLANT

## 2023-02-16 NOTE — Anesthesia Postprocedure Evaluation (Signed)
Anesthesia Post Note  Patient: Carla Ryan  Procedure(s) Performed: CATARACT EXTRACTION PHACO AND INTRAOCULAR LENS PLACEMENT (IOC) RIGHT  7.20  00:39.2 (Right: Eye)  Patient location during evaluation: PACU Anesthesia Type: MAC Level of consciousness: awake and alert Pain management: pain level controlled Vital Signs Assessment: post-procedure vital signs reviewed and stable Respiratory status: spontaneous breathing, nonlabored ventilation, respiratory function stable and patient connected to nasal cannula oxygen Cardiovascular status: blood pressure returned to baseline and stable Postop Assessment: no apparent nausea or vomiting Anesthetic complications: no   No notable events documented.   Last Vitals:  Vitals:   02/16/23 0930 02/16/23 0935  BP: (!) 115/44 (!) 116/54  Pulse: 66 69  Resp: 12 16  Temp: (!) 36.1 C (!) 36.1 C  SpO2: 97% 100%    Last Pain:  Vitals:   02/16/23 0935  TempSrc:   PainSc: 0-No pain                 Precious Haws Zadie Deemer

## 2023-02-16 NOTE — Op Note (Signed)
LOCATION:  [facility]   PREOPERATIVE DIAGNOSIS:    Nuclear sclerotic cataract right eye. H25.11   POSTOPERATIVE DIAGNOSIS:  Nuclear sclerotic cataract right eye.     PROCEDURE:  Phacoemusification with posterior chamber intraocular lens placement of the right eye   ULTRASOUND TIME: Procedure(s): CATARACT EXTRACTION PHACO AND INTRAOCULAR LENS PLACEMENT (IOC) RIGHT  7.20  00:39.2 (Right)  LENS:   Implant Name Type Inv. Item Serial No. Manufacturer Lot No. LRB No. Used Action  LENS IOL TECNIS EYHANCE 21.5 - [id number] Intraocular Lens LENS IOL TECNIS EYHANCE 21.5 [id number] [employer]  Right 1 Implanted         SURGEON:  Wyonia Hough, MD   ANESTHESIA:  Topical with tetracaine drops and 2% Xylocaine jelly, augmented with 1% preservative-free intracameral lidocaine.    COMPLICATIONS:  None.   DESCRIPTION OF PROCEDURE:  The patient was identified in the holding room and transported to the operating room and placed in the supine position under the operating microscope.  The right eye was identified as the operative eye and it was prepped and draped in the usual sterile ophthalmic fashion.   A 1 millimeter clear-corneal paracentesis was made at the 12:00 position.  0.5 ml of preservative-free 1% lidocaine was injected into the anterior chamber. The anterior chamber was filled with Viscoat viscoelastic.  A 2.4 millimeter keratome was used to make a near-clear corneal incision at the 9:00 position.  A curvilinear capsulorrhexis was made with a cystotome and capsulorrhexis forceps.  Balanced salt solution was used to hydrodissect and hydrodelineate the nucleus.   Phacoemulsification was then used in stop and chop fashion to remove the lens nucleus and epinucleus.  The remaining cortex was then removed using the irrigation and aspiration handpiece. Provisc was then placed into the capsular bag to distend it for lens placement.  A lens was then injected into the capsular bag.   The remaining viscoelastic was aspirated.   Wounds were hydrated with balanced salt solution.  The anterior chamber was inflated to a physiologic pressure with balanced salt solution.  No wound leaks were noted. Cefuroxime 0.1 ml of a '10mg'$ /ml solution was injected into the anterior chamber for a dose of 1 mg of intracameral antibiotic at the completion of the case.   Timolol and Brimonidine drops were applied to the eye.  The patient was taken to the recovery room in stable condition without complications of anesthesia or surgery.   Tyjae Shvartsman 02/16/2023, 9:29 AM

## 2023-02-16 NOTE — Anesthesia Preprocedure Evaluation (Signed)
Anesthesia Evaluation  Patient identified by MRN, date of birth, ID band Patient awake    Reviewed: Allergy & Precautions, NPO status , Patient's Chart, lab work & pertinent test results  History of Anesthesia Complications (+) PONV and history of anesthetic complications  Airway Mallampati: III  TM Distance: <3 FB Neck ROM: full    Dental  (+) Chipped, Missing, Partial Upper   Pulmonary neg shortness of breath, sleep apnea    Pulmonary exam normal        Cardiovascular Exercise Tolerance: Good hypertension, (-) angina Normal cardiovascular exam     Neuro/Psych  Headaches  Anxiety      Neuromuscular disease  negative psych ROS   GI/Hepatic Neg liver ROS, hiatal hernia,GERD  Controlled,,  Endo/Other  negative endocrine ROS    Renal/GU      Musculoskeletal   Abdominal   Peds  Hematology negative hematology ROS (+)   Anesthesia Other Findings Past Medical History: No date: Abdominal hernia No date: Anxiety     Comment:  situational stress and anxiety 2015: Cancer ([place])     Comment:  breast  No date: Cataract cortical, senile, unspecified laterality No date: Chest pain No date: Exertional dyspnea No date: GERD (gastroesophageal reflux disease) No date: Hiatal hernia No date: Hiatal hernia No date: Hypercholesterolemia No date: Hyperlipidemia No date: Hypertension No date: Osteoarthritis     Comment:  scoliosis, degenerative disc dz, knee, carpal tunnel No date: PONV (postoperative nausea and vomiting) No date: Sleep apnea  Past Surgical History: 07/2000: ABDOMINAL HYSTERECTOMY 2000: APPENDECTOMY No date: APPENDECTOMY 2014: BREAST BIOPSY; Bilateral 2015: BREAST SURGERY; Right     Comment:  lumpectomy No date: CARPAL TUNNEL RELEASE 2011: COLONOSCOPY     Comment:  [name], [name]: Normal exam 04/03/2020: COLONOSCOPY WITH PROPOFOL; N/A     Comment:  Procedure: COLONOSCOPY WITH PROPOFOL;  Surgeon:  Toledo,               Benay Pike, MD;  Location: [facility] END[facility];  Service:               Gastroenterology;  Laterality: N/A; 04/03/2020: ESOPHAGOGASTRODUODENOSCOPY (EGD) WITH PROPOFOL; N/A     Comment:  Procedure: ESOPHAGOGASTRODUODENOSCOPY (EGD) WITH               PROPOFOL;  Surgeon: Toledo, Benay Pike, MD;  Location:               [facility];  Service: Gastroenterology;  Laterality:               N/A; 02/10/15: HERNIA REPAIR 02/10/15: Laparoscopic Repair of Lateral Abdominal Wall Hernia No date: MASTECTOMY 05/10/2014: UPPER GI ENDOSCOPY     Comment:  [name], [name] Bile gastritis, medium hiatal               hernia. Incomplete visualization of the cardia and               fundus.  BMI    Body Mass Index: 27.52 kg/m      Reproductive/Obstetrics negative OB ROS                             Anesthesia Physical Anesthesia Plan  ASA: 3  Anesthesia Plan: MAC   Post-op Pain Management:    Induction: Intravenous  PONV Risk Score and Plan:   Airway Management Planned: Natural Airway and Nasal Cannula  Additional Equipment:   Intra-op Plan:   Post-operative Plan:  Informed Consent: I have reviewed the patients History and Physical, chart, labs and discussed the procedure including the risks, benefits and alternatives for the proposed anesthesia with the patient or authorized representative who has indicated his/her understanding and acceptance.     Dental Advisory Given  Plan Discussed with: Anesthesiologist, CRNA and Surgeon  Anesthesia Plan Comments: (Patient consented for risks of anesthesia including but not limited to:  - adverse reactions to medications - damage to eyes, teeth, lips or other oral mucosa - nerve damage due to positioning  - sore throat or hoarseness - Damage to heart, brain, nerves, lungs, other parts of body or loss of life  Patient voiced understanding.)       Anesthesia Quick Evaluation

## 2023-02-16 NOTE — H&P (Signed)
[facility]   Primary Care Physician:  Einar Pheasant, MD Ophthalmologist: Dr. Leandrew Koyanagi  Pre-Procedure History & Physical: HPI:  Carla Ryan is a 77 y.o. female here for ophthalmic surgery.   Past Medical History:  Diagnosis Date   Abdominal hernia    Anxiety    situational stress and anxiety   Cancer ([place]) 2015   breast    Cataract cortical, senile, unspecified laterality    Chest pain    Exertional dyspnea    GERD (gastroesophageal reflux disease)    Hiatal hernia    Hiatal hernia    Hypercholesterolemia    Hyperlipidemia    Hypertension    Osteoarthritis    scoliosis, degenerative disc dz, knee, carpal tunnel   PONV (postoperative nausea and vomiting)    Sleep apnea     Past Surgical History:  Procedure Laterality Date   ABDOMINAL HYSTERECTOMY  07/2000   APPENDECTOMY  2000   APPENDECTOMY     BREAST BIOPSY Bilateral 2014   BREAST SURGERY Right 2015   lumpectomy   CARPAL TUNNEL RELEASE     COLONOSCOPY  2011   [name], [name]: Normal exam   COLONOSCOPY WITH PROPOFOL N/A 04/03/2020   Procedure: COLONOSCOPY WITH PROPOFOL;  Surgeon: Toledo, Benay Pike, MD;  Location: [facility];  Service: Gastroenterology;  Laterality: N/A;   ESOPHAGOGASTRODUODENOSCOPY (EGD) WITH PROPOFOL N/A 04/03/2020   Procedure: ESOPHAGOGASTRODUODENOSCOPY (EGD) WITH PROPOFOL;  Surgeon: Toledo, Benay Pike, MD;  Location: [facility];  Service: Gastroenterology;  Laterality: N/A;   HERNIA REPAIR  02/10/15   Laparoscopic Repair of Lateral Abdominal Wall Hernia  02/10/15   MASTECTOMY     UPPER GI ENDOSCOPY  05/10/2014   [name], [name] Bile gastritis, medium hiatal hernia. Incomplete visualization of the cardia and fundus.    Prior to Admission medications   Medication Sig Start Date End Date Taking? Authorizing Provider  Ascorbic Acid (VITAMIN C) 1000 MG tablet Take 1,000 mg by mouth daily. 10/20/22  Yes [provider]  aspirin 81 MG tablet Take 81  mg by mouth daily.   Yes [provider]  Azelastine HCl 137 MCG/SPRAY SOLN INSTILL TWO SPRAYS IN EACH NOSTRIL TWICE DAILY AS DIRECTED 12/07/22  Yes Einar Pheasant, MD  Biotin 5 MG CAPS Take by mouth daily.   Yes [provider]  calcium citrate-vitamin D 500-400 MG-UNIT chewable tablet Chew 1 tablet by mouth 2 (two) times daily.   Yes [provider]  cholecalciferol (VITAMIN D) 1000 UNITS tablet Take 2,000 Units by mouth in the morning and at bedtime.    Yes [provider]  fluticasone (FLONASE) 50 MCG/ACT nasal spray USE 2 SPRAYS IN EACH NOSTRIL EVERY DAY 07/29/22  Yes Dutch Quint B, FNP  gabapentin (NEURONTIN) 100 MG capsule TAKE 2 CAPSULES AT BEDTIME 09/15/22  Yes Scott, Randell Patient, MD  irbesartan (AVAPRO) 75 MG tablet TAKE 1 TABLET EVERY DAY 06/21/22  Yes Dutch Quint B, FNP  levocetirizine (XYZAL) 5 MG tablet TAKE 1 TABLET EVERY EVENING 09/15/22  Yes Chesley Mires, MD  lidocaine (LIDODERM) 5 % Place 1 patch onto the skin as needed. Remove & Discard patch within 12 hours or as directed by MD   Yes [provider]  Magnesium Glycinate 100 MG CAPS Take 200 mg by mouth daily. 10/20/22  Yes [provider]  Misc Natural Products (TART CHERRY ADVANCED) CAPS Take 1 capsule by mouth 2 (two) times daily. Reported on 03/04/2016   Yes [provider]  MULTIPLE MINERALS-VITAMINS PO  Yes [provider]  PEPPERMINT OIL PO Take by mouth.   Yes [provider]  RABEprazole (ACIPHEX) 20 MG tablet Take 1 tablet (20 mg total) by mouth 2 (two) times daily. 10/14/17  Yes Einar Pheasant, MD  rosuvastatin (CRESTOR) 5 MG tablet Take 1 tablet (5 mg total) by mouth daily. 01/27/23  Yes Einar Pheasant, MD  sucralfate (CARAFATE) 1 g tablet Take 1 tablet (1 g total) by mouth 2 (two) times daily. 10/14/17  Yes Einar Pheasant, MD  triamterene-hydrochlorothiazide (MAXZIDE-25) 37.5-25 MG tablet TAKE 1/2 TABLET EVERY DAY 11/08/22  Yes Einar Pheasant, MD  methocarbamol (ROBAXIN) 500 MG tablet Take 1 tablet (500 mg total) by mouth at bedtime as needed for muscle spasms. Patient taking differently: Take 500 mg by mouth at bedtime as needed for muscle spasms. Pt taking prn 11/01/19   Einar Pheasant, MD    Allergies as of 01/20/2023   (No Known Allergies)    Family History  Problem Relation Age of Onset   Diabetes Mother    Multiple myeloma Mother    Multiple myeloma Father    Diabetes Sister        x2   Diabetes Brother    Hypertension Brother    Dementia Brother     Social History   Socioeconomic History   Marital status: Married    Spouse name: Not on file   Number of children: 2   Years of education: Not on file   Highest education level: Not on file  Occupational History   Not on file  Tobacco Use   Smoking status: Never   Smokeless tobacco: Never  Vaping Use   Vaping Use: Never used  Substance and Sexual Activity   Alcohol use: Yes    Alcohol/week: 0.0 standard drinks of alcohol    Comment: wine occasionally   Drug use: No   Sexual activity: Yes  Other Topics Concern   Not on file  Social History Narrative   Not on file   Social Determinants of Health   Financial Resource Strain: Low Risk  (06/09/2022)   Overall Financial Resource Strain (CARDIA)    Difficulty of Paying Living Expenses: Not hard at all  Food Insecurity: No Food Insecurity (06/09/2022)   Hunger Vital Sign    Worried About Running Out of Food in the Last Year: Never true    Ran Out of Food in the Last Year: Never true  Transportation Needs: No Transportation Needs (06/09/2022)   PRAPARE - Hydrologist (Medical): No    Lack of Transportation (Non-Medical): No  Physical Activity: Insufficiently Active (06/09/2022)   Exercise Vital Sign    Days of Exercise per Week: 3 days    Minutes of Exercise per Session: 30 min  Stress: No Stress Concern Present (06/09/2022)   [place]    Feeling of Stress : Not at all  Social Connections: Unknown (06/09/2022)   Social Connection and Isolation Panel [NHANES]    Frequency of Communication with Friends and Family: Not on file    Frequency of Social Gatherings with Friends and Family: Not on file    Attends Religious Services: Not on file    Active Member of Clubs or Organizations: Not on file    Attends Archivist Meetings: Not on file    Marital Status: Married  Intimate Partner Violence: Not At Risk (06/09/2022)   Humiliation, Afraid, Rape, and Kick questionnaire  Fear of Current or Ex-Partner: No    Emotionally Abused: No    Physically Abused: No    Sexually Abused: No    Review of Systems: See HPI, otherwise negative ROS  Physical Exam: BP 113/79   Pulse 70   Temp (!) 97.5 F (36.4 C) (Temporal)   Resp 13   Ht 5' (1.524 m)   Wt 63.9 kg   SpO2 100%   BMI 27.52 kg/m  General:   Alert,  pleasant and cooperative in NAD Head:  Normocephalic and atraumatic. Lungs:  Clear to auscultation.    Heart:  Regular rate and rhythm.   Impression/Plan: Carla Ryan is here for ophthalmic surgery.  Risks, benefits, limitations, and alternatives regarding ophthalmic surgery have been reviewed with the patient.  Questions have been answered.  All parties agreeable.   Leandrew Koyanagi, MD  02/16/2023, 8:53 AM

## 2023-02-16 NOTE — Transfer of Care (Signed)
Immediate Anesthesia Transfer of Care Note  Patient: Carla Ryan  Procedure(s) Performed: CATARACT EXTRACTION PHACO AND INTRAOCULAR LENS PLACEMENT (IOC) RIGHT  7.20  00:39.2 (Right: Eye)  Patient Location: PACU  Anesthesia Type: MAC  Level of Consciousness: awake, alert  and patient cooperative  Airway and Oxygen Therapy: Patient Spontanous Breathing and Patient connected to supplemental oxygen  Post-op Assessment: Post-op Vital signs reviewed, Patient's Cardiovascular Status Stable, Respiratory Function Stable, Patent Airway and No signs of Nausea or vomiting  Post-op Vital Signs: Reviewed and stable  Complications: No notable events documented.

## 2023-02-17 ENCOUNTER — Other Ambulatory Visit: Payer: Self-pay | Admitting: Internal Medicine

## 2023-02-17 ENCOUNTER — Encounter: Payer: Self-pay | Admitting: Ophthalmology

## 2023-02-17 DIAGNOSIS — H353131 Nonexudative age-related macular degeneration, bilateral, early dry stage: Secondary | ICD-10-CM | POA: Diagnosis not present

## 2023-02-17 DIAGNOSIS — H2512 Age-related nuclear cataract, left eye: Secondary | ICD-10-CM | POA: Diagnosis not present

## 2023-02-23 DIAGNOSIS — I10 Essential (primary) hypertension: Secondary | ICD-10-CM | POA: Diagnosis not present

## 2023-02-23 DIAGNOSIS — I351 Nonrheumatic aortic (valve) insufficiency: Secondary | ICD-10-CM | POA: Diagnosis not present

## 2023-02-24 DIAGNOSIS — C50211 Malignant neoplasm of upper-inner quadrant of right female breast: Secondary | ICD-10-CM | POA: Diagnosis not present

## 2023-02-24 DIAGNOSIS — Z1231 Encounter for screening mammogram for malignant neoplasm of breast: Secondary | ICD-10-CM | POA: Diagnosis not present

## 2023-02-24 DIAGNOSIS — Z170 Estrogen receptor positive status [ER+]: Secondary | ICD-10-CM | POA: Diagnosis not present

## 2023-02-24 DIAGNOSIS — R92323 Mammographic fibroglandular density, bilateral breasts: Secondary | ICD-10-CM | POA: Diagnosis not present

## 2023-02-24 LAB — HM MAMMOGRAPHY

## 2023-02-28 NOTE — Discharge Instructions (Signed)

## 2023-03-02 ENCOUNTER — Ambulatory Visit: Payer: Medicare HMO | Admitting: General Practice

## 2023-03-02 ENCOUNTER — Ambulatory Visit
Admission: RE | Admit: 2023-03-02 | Discharge: 2023-03-02 | Disposition: A | Discharge status: Home / Self Care | Payer: Medicare HMO | Source: Ambulatory Visit | Attending: Ophthalmology | Admitting: Ophthalmology

## 2023-03-02 ENCOUNTER — Encounter
Admission: RE | Disposition: A | Discharge status: Home / Self Care | Payer: Self-pay | Source: Ambulatory Visit | Attending: Ophthalmology

## 2023-03-02 ENCOUNTER — Encounter: Payer: Self-pay | Admitting: Ophthalmology

## 2023-03-02 ENCOUNTER — Other Ambulatory Visit: Payer: Self-pay

## 2023-03-02 DIAGNOSIS — G4730 Sleep apnea, unspecified: Secondary | ICD-10-CM | POA: Insufficient documentation

## 2023-03-02 DIAGNOSIS — I10 Essential (primary) hypertension: Secondary | ICD-10-CM | POA: Diagnosis not present

## 2023-03-02 DIAGNOSIS — K449 Diaphragmatic hernia without obstruction or gangrene: Secondary | ICD-10-CM | POA: Diagnosis not present

## 2023-03-02 DIAGNOSIS — H2511 Age-related nuclear cataract, right eye: Secondary | ICD-10-CM | POA: Diagnosis not present

## 2023-03-02 DIAGNOSIS — H2512 Age-related nuclear cataract, left eye: Secondary | ICD-10-CM | POA: Diagnosis present

## 2023-03-02 DIAGNOSIS — K219 Gastro-esophageal reflux disease without esophagitis: Secondary | ICD-10-CM | POA: Insufficient documentation

## 2023-03-02 HISTORY — PX: CATARACT EXTRACTION W/PHACO: SHX586

## 2023-03-02 SURGERY — PHACOEMULSIFICATION, CATARACT, WITH IOL INSERTION
Anesthesia: Monitor Anesthesia Care | Site: Eye | Laterality: Left

## 2023-03-02 MED ORDER — ACETAMINOPHEN 325 MG PO TABS: 650.0000 mg | ORAL_TABLET | Freq: Once | ORAL | Status: DC | PRN

## 2023-03-02 MED ORDER — MIDAZOLAM HCL 2 MG/2ML IJ SOLN
INTRAMUSCULAR | Status: DC | PRN
  Administered 2023-03-02: 1 mg via INTRAVENOUS

## 2023-03-02 MED ORDER — FENTANYL CITRATE (PF) 100 MCG/2ML IJ SOLN
INTRAMUSCULAR | Status: DC | PRN
  Administered 2023-03-02: 50 ug via INTRAVENOUS

## 2023-03-02 MED ORDER — ACETAMINOPHEN 160 MG/5ML PO SOLN: 325.0000 mg | ORAL | Status: DC | PRN

## 2023-03-02 MED ORDER — SIGHTPATH DOSE#1 BSS IO SOLN
INTRAOCULAR | Status: DC | PRN
  Administered 2023-03-02: 51 mL via OPHTHALMIC

## 2023-03-02 MED ORDER — TETRACAINE HCL 0.5 % OP SOLN
1.0000 [drp] | OPHTHALMIC | Status: DC | PRN
  Administered 2023-03-02 (×3): 1 [drp] via OPHTHALMIC

## 2023-03-02 MED ORDER — SIGHTPATH DOSE#1 BSS IO SOLN
INTRAOCULAR | Status: DC | PRN
  Administered 2023-03-02: 1 mL via INTRAMUSCULAR

## 2023-03-02 MED ORDER — LACTATED RINGERS IV SOLN: INTRAVENOUS | Status: DC

## 2023-03-02 MED ORDER — SIGHTPATH DOSE#1 BSS IO SOLN
INTRAOCULAR | Status: DC | PRN
  Administered 2023-03-02: 15 mL

## 2023-03-02 MED ORDER — ONDANSETRON HCL 4 MG/2ML IJ SOLN: 4.0000 mg | Freq: Once | INTRAMUSCULAR | Status: DC | PRN

## 2023-03-02 MED ORDER — SODIUM CHLORIDE 0.9% FLUSH
INTRAVENOUS | Status: DC | PRN
  Administered 2023-03-02: 10 mL via INTRAVENOUS

## 2023-03-02 MED ORDER — CEFUROXIME OPHTHALMIC INJECTION 1 MG/0.1 ML
INJECTION | OPHTHALMIC | Status: DC | PRN
  Administered 2023-03-02: .1 mL via INTRACAMERAL

## 2023-03-02 MED ORDER — ARMC OPHTHALMIC DILATING DROPS
1.0000 | OPHTHALMIC | Status: DC | PRN
  Administered 2023-03-02 (×3): 1 via OPHTHALMIC

## 2023-03-02 MED ORDER — SIGHTPATH DOSE#1 NA HYALUR & NA CHOND-NA HYALUR IO KIT
PACK | INTRAOCULAR | Status: DC | PRN
  Administered 2023-03-02: 1 via OPHTHALMIC

## 2023-03-02 MED ORDER — BRIMONIDINE TARTRATE-TIMOLOL 0.2-0.5 % OP SOLN
OPHTHALMIC | Status: DC | PRN
  Administered 2023-03-02: 1 [drp] via OPHTHALMIC

## 2023-03-02 SURGICAL SUPPLY — 10 items
CATARACT SUITE SIGHTPATH (MISCELLANEOUS) ×1 IMPLANT
FEE CATARACT SUITE SIGHTPATH (MISCELLANEOUS) ×1 IMPLANT
GLOVE SRG 8 PF TXTR STRL LF DI (GLOVE) ×1 IMPLANT
GLOVE SURG ENC TEXT LTX SZ7.5 (GLOVE) ×1 IMPLANT
GLOVE SURG UNDER POLY LF SZ8 (GLOVE) ×1
LENS IOL TECNIS EYHANCE 22.0 (Intraocular Lens) IMPLANT
NDL FILTER BLUNT 18X1 1/2 (NEEDLE) ×1 IMPLANT
NEEDLE FILTER BLUNT 18X1 1/2 (NEEDLE) ×1 IMPLANT
SYR 3ML LL SCALE MARK (SYRINGE) ×1 IMPLANT
WATER STERILE IRR 250ML POUR (IV SOLUTION) ×1 IMPLANT

## 2023-03-02 NOTE — Anesthesia Procedure Notes (Signed)
Procedure Name: MAC Date/Time: 03/02/2023 11:58 AM  Performed by: Hilbert Odor, CRNAPre-anesthesia Checklist: Patient identified, Emergency Drugs available, Suction available, Patient being monitored and Timeout performed Oxygen Delivery Method: Nasal cannula Preoxygenation: Pre-oxygenation with 100% oxygen Induction Type: IV induction

## 2023-03-02 NOTE — H&P (Signed)
[facility]   Primary Care Physician:  Einar Pheasant, MD Ophthalmologist: Dr. Leandrew Koyanagi  Pre-Procedure History & Physical: HPI:  Carla Ryan is a 77 y.o. female here for ophthalmic surgery.   Past Medical History:  Diagnosis Date   Abdominal hernia    Anxiety    situational stress and anxiety   Cancer ([place]) 2015   breast    Cataract cortical, senile, unspecified laterality    Chest pain    Exertional dyspnea    GERD (gastroesophageal reflux disease)    Hiatal hernia    Hiatal hernia    Hypercholesterolemia    Hyperlipidemia    Hypertension    Osteoarthritis    scoliosis, degenerative disc dz, knee, carpal tunnel   PONV (postoperative nausea and vomiting)    Sleep apnea     Past Surgical History:  Procedure Laterality Date   ABDOMINAL HYSTERECTOMY  07/2000   APPENDECTOMY  2000   APPENDECTOMY     BREAST BIOPSY Bilateral 2014   BREAST SURGERY Right 2015   lumpectomy   CARPAL TUNNEL RELEASE     CATARACT EXTRACTION W/PHACO Right 02/16/2023   Procedure: CATARACT EXTRACTION PHACO AND INTRAOCULAR LENS PLACEMENT ([place]) RIGHT  7.20  00:39.2;  Surgeon: Leandrew Koyanagi, MD;  Location: [place];  Service: Ophthalmology;  Laterality: Right;   COLONOSCOPY  2011   [name], [name]: Normal exam   COLONOSCOPY WITH PROPOFOL N/A 04/03/2020   Procedure: COLONOSCOPY WITH PROPOFOL;  Surgeon: Toledo, Benay Pike, MD;  Location: [facility];  Service: Gastroenterology;  Laterality: N/A;   ESOPHAGOGASTRODUODENOSCOPY (EGD) WITH PROPOFOL N/A 04/03/2020   Procedure: ESOPHAGOGASTRODUODENOSCOPY (EGD) WITH PROPOFOL;  Surgeon: Toledo, Benay Pike, MD;  Location: [facility];  Service: Gastroenterology;  Laterality: N/A;   HERNIA REPAIR  02/10/15   Laparoscopic Repair of Lateral Abdominal Wall Hernia  02/10/15   MASTECTOMY     UPPER GI ENDOSCOPY  05/10/2014   [name], [name] Bile gastritis, medium hiatal hernia. Incomplete visualization of the cardia  and fundus.    Prior to Admission medications   Medication Sig Start Date End Date Taking? Authorizing Provider  Ascorbic Acid (VITAMIN C) 1000 MG tablet Take 1,000 mg by mouth daily. 10/20/22  Yes [provider]  aspirin 81 MG tablet Take 81 mg by mouth daily.   Yes [provider]  Azelastine HCl 137 MCG/SPRAY SOLN INSTILL TWO SPRAYS INTO EACH NOSTIL TWICE A DAY AS DIRECTED. 02/17/23  Yes Einar Pheasant, MD  Biotin 5 MG CAPS Take by mouth daily.   Yes [provider]  calcium citrate-vitamin D 500-400 MG-UNIT chewable tablet Chew 1 tablet by mouth 2 (two) times daily.   Yes [provider]  cholecalciferol (VITAMIN D) 1000 UNITS tablet Take 2,000 Units by mouth in the morning and at bedtime.    Yes [provider]  fluticasone (FLONASE) 50 MCG/ACT nasal spray USE 2 SPRAYS IN EACH NOSTRIL EVERY DAY 07/29/22  Yes Dutch Quint B, FNP  gabapentin (NEURONTIN) 100 MG capsule TAKE 2 CAPSULES AT BEDTIME 09/15/22  Yes Scott, Randell Patient, MD  irbesartan (AVAPRO) 75 MG tablet TAKE 1 TABLET EVERY DAY 06/21/22  Yes Dutch Quint B, FNP  levocetirizine (XYZAL) 5 MG tablet TAKE 1 TABLET EVERY EVENING 09/15/22  Yes Chesley Mires, MD  Magnesium Glycinate 100 MG CAPS Take 200 mg by mouth daily. 10/20/22  Yes [provider]  Misc Natural Products (TART CHERRY ADVANCED) CAPS Take 1 capsule by mouth 2 (two) times daily. Reported on 03/04/2016  Yes [provider]  MULTIPLE MINERALS-VITAMINS PO    Yes [provider]  PEPPERMINT OIL PO Take by mouth.   Yes [provider]  RABEprazole (ACIPHEX) 20 MG tablet Take 1 tablet (20 mg total) by mouth 2 (two) times daily. 10/14/17  Yes Einar Pheasant, MD  rosuvastatin (CRESTOR) 5 MG tablet Take 1 tablet (5 mg total) by mouth daily. 01/27/23  Yes Einar Pheasant, MD  sucralfate (CARAFATE) 1 g tablet Take 1 tablet (1 g total) by mouth 2 (two) times daily. 10/14/17  Yes Einar Pheasant, MD   triamterene-hydrochlorothiazide (MAXZIDE-25) 37.5-25 MG tablet TAKE 1/2 TABLET EVERY DAY 11/08/22  Yes Einar Pheasant, MD  lidocaine (LIDODERM) 5 % Place 1 patch onto the skin as needed. Remove & Discard patch within 12 hours or as directed by MD    [provider]  methocarbamol (ROBAXIN) 500 MG tablet Take 1 tablet (500 mg total) by mouth at bedtime as needed for muscle spasms. Patient taking differently: Take 500 mg by mouth at bedtime as needed for muscle spasms. Pt taking prn 11/01/19   Einar Pheasant, MD    Allergies as of 01/20/2023   (No Known Allergies)    Family History  Problem Relation Age of Onset   Diabetes Mother    Multiple myeloma Mother    Multiple myeloma Father    Diabetes Sister        x2   Diabetes Brother    Hypertension Brother    Dementia Brother     Social History   Socioeconomic History   Marital status: Married    Spouse name: Not on file   Number of children: 2   Years of education: Not on file   Highest education level: Not on file  Occupational History   Not on file  Tobacco Use   Smoking status: Never   Smokeless tobacco: Never  Vaping Use   Vaping Use: Never used  Substance and Sexual Activity   Alcohol use: Yes    Alcohol/week: 0.0 standard drinks of alcohol    Comment: wine occasionally   Drug use: No   Sexual activity: Yes  Other Topics Concern   Not on file  Social History Narrative   Not on file   Social Determinants of Health   Financial Resource Strain: Low Risk  (06/09/2022)   Overall Financial Resource Strain (CARDIA)    Difficulty of Paying Living Expenses: Not hard at all  Food Insecurity: No Food Insecurity (06/09/2022)   Hunger Vital Sign    Worried About Running Out of Food in the Last Year: Never true    Ran Out of Food in the Last Year: Never true  Transportation Needs: No Transportation Needs (06/09/2022)   PRAPARE - Hydrologist (Medical): No    Lack of Transportation  (Non-Medical): No  Physical Activity: Insufficiently Active (06/09/2022)   Exercise Vital Sign    Days of Exercise per Week: 3 days    Minutes of Exercise per Session: 30 min  Stress: No Stress Concern Present (06/09/2022)   [place]    Feeling of Stress : Not at all  Social Connections: Unknown (06/09/2022)   Social Connection and Isolation Panel [NHANES]    Frequency of Communication with Friends and Family: Not on file    Frequency of Social Gatherings with Friends and Family: Not on file    Attends Religious Services: Not on file    Active  Member of Clubs or Organizations: Not on file    Attends Club or Organization Meetings: Not on file    Marital Status: Married  Intimate Partner Violence: Not At Risk (06/09/2022)   Humiliation, Afraid, Rape, and Kick questionnaire    Fear of Current or Ex-Partner: No    Emotionally Abused: No    Physically Abused: No    Sexually Abused: No    Review of Systems: See HPI, otherwise negative ROS  Physical Exam: BP (!) 142/49   Pulse 66   Temp 98.1 F (36.7 C)   Wt 64 kg   SpO2 100%   BMI 27.54 kg/m  General:   Alert,  pleasant and cooperative in NAD Head:  Normocephalic and atraumatic. Lungs:  Clear to auscultation.    Heart:  Regular rate and rhythm.   Impression/Plan: [name] is here for ophthalmic surgery.  Risks, benefits, limitations, and alternatives regarding ophthalmic surgery have been reviewed with the patient.  Questions have been answered.  All parties agreeable.   Leandrew Koyanagi, MD  03/02/2023, 10:33 AM

## 2023-03-02 NOTE — Anesthesia Preprocedure Evaluation (Signed)
Anesthesia Evaluation  Patient identified by MRN, date of birth, ID band Patient awake    Reviewed: Allergy & Precautions, NPO status , Patient's Chart, lab work & pertinent test results  History of Anesthesia Complications (+) PONV and history of anesthetic complications  Airway Mallampati: II   Neck ROM: Full    Dental  (+) Missing, Upper Dentures   Pulmonary sleep apnea    Pulmonary exam normal breath sounds clear to auscultation       Cardiovascular hypertension, Normal cardiovascular exam Rhythm:Regular Rate:Normal     Neuro/Psych  Headaches PSYCHIATRIC DISORDERS Anxiety        GI/Hepatic hiatal hernia,GERD  ,,  Endo/Other  negative endocrine ROS    Renal/GU negative Renal ROS     Musculoskeletal   Abdominal   Peds  Hematology Breast CA   Anesthesia Other Findings   Reproductive/Obstetrics                             Anesthesia Physical Anesthesia Plan  ASA: 2  Anesthesia Plan: MAC   Post-op Pain Management:    Induction: Intravenous  PONV Risk Score and Plan: 3 and Treatment may vary due to age or medical condition, Midazolam and TIVA  Airway Management Planned: Natural Airway and Nasal Cannula  Additional Equipment:   Intra-op Plan:   Post-operative Plan:   Informed Consent: I have reviewed the patients History and Physical, chart, labs and discussed the procedure including the risks, benefits and alternatives for the proposed anesthesia with the patient or authorized representative who has indicated his/her understanding and acceptance.     Dental advisory given  Plan Discussed with: CRNA  Anesthesia Plan Comments: (LMA/GETA backup discussed.  Patient consented for risks of anesthesia including but not limited to:  - adverse reactions to medications - damage to eyes, teeth, lips or other oral mucosa - nerve damage due to positioning  - sore throat or  hoarseness - damage to heart, brain, nerves, lungs, other parts of body or loss of life  Informed patient about role of CRNA in peri- and intra-operative care.  Patient voiced understanding.)       Anesthesia Quick Evaluation

## 2023-03-02 NOTE — Transfer of Care (Signed)
Immediate Anesthesia Transfer of Care Note  Patient: Carla Ryan  Procedure(s) Performed: CATARACT EXTRACTION PHACO AND INTRAOCULAR LENS PLACEMENT (IOC) LEFT  6.41  00:42.0 (Left: Eye)  Patient Location: PACU  Anesthesia Type: MAC  Level of Consciousness: awake, alert  and patient cooperative  Airway and Oxygen Therapy: Patient Spontanous Breathing and Patient connected to supplemental oxygen  Post-op Assessment: Post-op Vital signs reviewed, Patient's Cardiovascular Status Stable, Respiratory Function Stable, Patent Airway and No signs of Nausea or vomiting  Post-op Vital Signs: Reviewed and stable  Complications: No notable events documented.

## 2023-03-02 NOTE — Anesthesia Postprocedure Evaluation (Signed)
Anesthesia Post Note  Patient: Carla Ryan  Procedure(s) Performed: CATARACT EXTRACTION PHACO AND INTRAOCULAR LENS PLACEMENT (IOC) LEFT  6.41  00:42.0 (Left: Eye)  Patient location during evaluation: PACU Anesthesia Type: MAC Level of consciousness: awake and alert, oriented and patient cooperative Pain management: pain level controlled Vital Signs Assessment: post-procedure vital signs reviewed and stable Respiratory status: spontaneous breathing, nonlabored ventilation and respiratory function stable Cardiovascular status: blood pressure returned to baseline and stable Postop Assessment: adequate PO intake Anesthetic complications: no   No notable events documented.   Last Vitals:  Vitals:   03/02/23 1214 03/02/23 1218  BP: 128/66 (!) 147/51  Pulse: 71 63  Resp: 16 18  Temp: (!) 36.2 C (!) 36.2 C  SpO2: 98% 98%    Last Pain:  Vitals:   03/02/23 1218  PainSc: 0-No pain                 Darrin Nipper

## 2023-03-02 NOTE — Op Note (Signed)
  OPERATIVE NOTE  Carla Ryan [id number] 03/02/2023   PREOPERATIVE DIAGNOSIS:  Nuclear sclerotic cataract left eye. H25.12   POSTOPERATIVE DIAGNOSIS:    Nuclear sclerotic cataract left eye.     PROCEDURE:  Phacoemusification with posterior chamber intraocular lens placement of the left eye  Ultrasound time: Procedure(s): CATARACT EXTRACTION PHACO AND INTRAOCULAR LENS PLACEMENT (IOC) LEFT  6.41  00:42.0 (Left)  LENS:   Implant Name Type Inv. Item Serial No. Manufacturer Lot No. LRB No. Used Action  LENS IOL TECNIS EYHANCE 22.0 - [id number] Intraocular Lens LENS IOL TECNIS EYHANCE 22.0 [id number] [employer]  Left 1 Implanted      SURGEON:  Wyonia Hough, MD   ANESTHESIA:  Topical with tetracaine drops and 2% Xylocaine jelly, augmented with 1% preservative-free intracameral lidocaine.    COMPLICATIONS:  None.   DESCRIPTION OF PROCEDURE:  The patient was identified in the holding room and transported to the operating room and placed in the supine position under the operating microscope.  The left eye was identified as the operative eye and it was prepped and draped in the usual sterile ophthalmic fashion.   A 1 millimeter clear-corneal paracentesis was made at the 1:30 position.  0.5 ml of preservative-free 1% lidocaine was injected into the anterior chamber.  The anterior chamber was filled with Viscoat viscoelastic.  A 2.4 millimeter keratome was used to make a near-clear corneal incision at the 10:30 position.  .  A curvilinear capsulorrhexis was made with a cystotome and capsulorrhexis forceps.  Balanced salt solution was used to hydrodissect and hydrodelineate the nucleus.   Phacoemulsification was then used in stop and chop fashion to remove the lens nucleus and epinucleus.  The remaining cortex was then removed using the irrigation and aspiration handpiece. Provisc was then placed into the capsular bag to distend it for lens placement.  A lens was then injected into the  capsular bag.  The remaining viscoelastic was aspirated.   Wounds were hydrated with balanced salt solution.  The anterior chamber was inflated to a physiologic pressure with balanced salt solution.  No wound leaks were noted. Cefuroxime 0.1 ml of a 10mg /ml solution was injected into the anterior chamber for a dose of 1 mg of intracameral antibiotic at the completion of the case.   Timolol and Brimonidine drops were applied to the eye.  The patient was taken to the recovery room in stable condition without complications of anesthesia or surgery.  [name] 03/02/2023, 12:12 PM

## 2023-03-03 ENCOUNTER — Encounter: Payer: Self-pay | Admitting: Ophthalmology

## 2023-03-07 DIAGNOSIS — M5416 Radiculopathy, lumbar region: Secondary | ICD-10-CM | POA: Diagnosis not present

## 2023-03-07 DIAGNOSIS — M5126 Other intervertebral disc displacement, lumbar region: Secondary | ICD-10-CM | POA: Diagnosis not present

## 2023-03-07 DIAGNOSIS — M48062 Spinal stenosis, lumbar region with neurogenic claudication: Secondary | ICD-10-CM | POA: Diagnosis not present

## 2023-03-09 DIAGNOSIS — L603 Nail dystrophy: Secondary | ICD-10-CM | POA: Diagnosis not present

## 2023-03-09 DIAGNOSIS — L578 Other skin changes due to chronic exposure to nonionizing radiation: Secondary | ICD-10-CM | POA: Diagnosis not present

## 2023-03-09 DIAGNOSIS — D2330 Other benign neoplasm of skin of unspecified part of face: Secondary | ICD-10-CM | POA: Diagnosis not present

## 2023-03-09 DIAGNOSIS — Z86018 Personal history of other benign neoplasm: Secondary | ICD-10-CM | POA: Diagnosis not present

## 2023-03-11 DIAGNOSIS — H353131 Nonexudative age-related macular degeneration, bilateral, early dry stage: Secondary | ICD-10-CM | POA: Diagnosis not present

## 2023-03-11 DIAGNOSIS — H2512 Age-related nuclear cataract, left eye: Secondary | ICD-10-CM | POA: Diagnosis not present

## 2023-03-28 DIAGNOSIS — Z0100 Encounter for examination of eyes and vision without abnormal findings: Secondary | ICD-10-CM | POA: Diagnosis not present

## 2023-03-29 DIAGNOSIS — M4802 Spinal stenosis, cervical region: Secondary | ICD-10-CM | POA: Diagnosis not present

## 2023-03-29 DIAGNOSIS — M5412 Radiculopathy, cervical region: Secondary | ICD-10-CM | POA: Diagnosis not present

## 2023-04-06 DIAGNOSIS — Z0100 Encounter for examination of eyes and vision without abnormal findings: Secondary | ICD-10-CM | POA: Diagnosis not present

## 2023-04-21 DIAGNOSIS — M5126 Other intervertebral disc displacement, lumbar region: Secondary | ICD-10-CM | POA: Diagnosis not present

## 2023-04-21 DIAGNOSIS — M5412 Radiculopathy, cervical region: Secondary | ICD-10-CM | POA: Diagnosis not present

## 2023-04-21 DIAGNOSIS — M48062 Spinal stenosis, lumbar region with neurogenic claudication: Secondary | ICD-10-CM | POA: Diagnosis not present

## 2023-04-21 DIAGNOSIS — M4802 Spinal stenosis, cervical region: Secondary | ICD-10-CM | POA: Diagnosis not present

## 2023-04-21 DIAGNOSIS — M5136 Other intervertebral disc degeneration, lumbar region: Secondary | ICD-10-CM | POA: Diagnosis not present

## 2023-04-21 DIAGNOSIS — M5416 Radiculopathy, lumbar region: Secondary | ICD-10-CM | POA: Diagnosis not present

## 2023-04-21 DIAGNOSIS — M5030 Other cervical disc degeneration, unspecified cervical region: Secondary | ICD-10-CM | POA: Diagnosis not present

## 2023-04-26 ENCOUNTER — Other Ambulatory Visit (INDEPENDENT_AMBULATORY_CARE_PROVIDER_SITE_OTHER): Payer: Medicare HMO

## 2023-04-26 DIAGNOSIS — E7800 Pure hypercholesterolemia, unspecified: Secondary | ICD-10-CM | POA: Diagnosis not present

## 2023-04-26 DIAGNOSIS — I10 Essential (primary) hypertension: Secondary | ICD-10-CM

## 2023-04-26 DIAGNOSIS — R739 Hyperglycemia, unspecified: Secondary | ICD-10-CM

## 2023-04-26 LAB — HEPATIC FUNCTION PANEL
ALT: 18 U/L (ref 0–35)
AST: 17 U/L (ref 0–37)
Albumin: 4 g/dL (ref 3.5–5.2)
Alkaline Phosphatase: 45 U/L (ref 39–117)
Bilirubin, Direct: 0.1 mg/dL (ref 0.0–0.3)
Total Bilirubin: 0.5 mg/dL (ref 0.2–1.2)
Total Protein: 6.1 g/dL (ref 6.0–8.3)

## 2023-04-26 LAB — HEMOGLOBIN A1C: Hgb A1c MFr Bld: 6.2 % (ref 4.6–6.5)

## 2023-04-26 LAB — BASIC METABOLIC PANEL
BUN: 22 mg/dL (ref 6–23)
CO2: 28 mEq/L (ref 19–32)
Calcium: 9.7 mg/dL (ref 8.4–10.5)
Chloride: 103 mEq/L (ref 96–112)
Creatinine, Ser: 0.91 mg/dL (ref 0.40–1.20)
GFR: 60.12 mL/min (ref >60.00)
Glucose, Bld: 91 mg/dL (ref 70–99)
Potassium: 3.9 mEq/L (ref 3.5–5.1)
Sodium: 139 mEq/L (ref 135–145)

## 2023-04-26 LAB — LIPID PANEL
Cholesterol: 165 mg/dL (ref 0–200)
HDL: 73.2 mg/dL (ref >39.00)
LDL Cholesterol: 72 mg/dL (ref 0–99)
NonHDL: 91.9
Total CHOL/HDL Ratio: 2
Triglycerides: 101 mg/dL (ref 0.0–149.0)
VLDL: 20.2 mg/dL (ref 0.0–40.0)

## 2023-04-26 LAB — TSH: TSH: 3.03 u[IU]/mL (ref 0.35–5.50)

## 2023-04-28 ENCOUNTER — Ambulatory Visit (INDEPENDENT_AMBULATORY_CARE_PROVIDER_SITE_OTHER): Payer: Medicare HMO | Admitting: Internal Medicine

## 2023-04-28 ENCOUNTER — Encounter: Payer: Self-pay | Admitting: Internal Medicine

## 2023-04-28 VITALS — BP 122/70 | HR 66 | Temp 98.2°F | Resp 16 | Ht 60.0 in | Wt 143.6 lb

## 2023-04-28 DIAGNOSIS — G4733 Obstructive sleep apnea (adult) (pediatric): Secondary | ICD-10-CM | POA: Diagnosis not present

## 2023-04-28 DIAGNOSIS — M859 Disorder of bone density and structure, unspecified: Secondary | ICD-10-CM | POA: Diagnosis not present

## 2023-04-28 DIAGNOSIS — R739 Hyperglycemia, unspecified: Secondary | ICD-10-CM | POA: Diagnosis not present

## 2023-04-28 DIAGNOSIS — M545 Low back pain, unspecified: Secondary | ICD-10-CM | POA: Diagnosis not present

## 2023-04-28 DIAGNOSIS — K219 Gastro-esophageal reflux disease without esophagitis: Secondary | ICD-10-CM | POA: Diagnosis not present

## 2023-04-28 DIAGNOSIS — Z853 Personal history of malignant neoplasm of breast: Secondary | ICD-10-CM

## 2023-04-28 DIAGNOSIS — F439 Reaction to severe stress, unspecified: Secondary | ICD-10-CM

## 2023-04-28 DIAGNOSIS — I10 Essential (primary) hypertension: Secondary | ICD-10-CM

## 2023-04-28 DIAGNOSIS — E7800 Pure hypercholesterolemia, unspecified: Secondary | ICD-10-CM | POA: Diagnosis not present

## 2023-04-28 MED ORDER — FLUTICASONE PROPIONATE 50 MCG/ACT NA SUSP: 2.0000 | Freq: Every day | NASAL | 1 refills | Status: DC

## 2023-04-28 MED ORDER — ROSUVASTATIN CALCIUM 5 MG PO TABS: 5.0000 mg | ORAL_TABLET | Freq: Every day | ORAL | 3 refills | Status: DC

## 2023-04-28 MED ORDER — GABAPENTIN 100 MG PO CAPS: 200.0000 mg | ORAL_CAPSULE | Freq: Every day | ORAL | 1 refills | Status: DC

## 2023-04-28 MED ORDER — IRBESARTAN 75 MG PO TABS: 75.0000 mg | ORAL_TABLET | Freq: Every day | ORAL | 3 refills | Status: DC

## 2023-04-28 NOTE — Progress Notes (Signed)
Subjective:    Patient ID: Carla Ryan, female    DOB: 06/03/1946, 77 y.o.   MRN: [id number]  Patient here for  Chief Complaint  Patient presents with   Medical Management of Chronic Issues    HPI Here for follow up appt - f/u regarding hypertension and hypercholesterolemia.  Using cpap. Seeing Dr Mickeal Needy - f/u neck and back pain. S/p ESI previously.  Helped some.  Reevaluated 04/21/23.  Scheduled for [facility] 06/07/23. Having discomfort - right upper poster shoulder/back.  Discussed. Massage.  She had discussed this with Whitney as well.  Recommended to continue tylenol, gabapentin and tramadol if needed.  Saw cardiology 01/27/23 - pre op evaluation - right side chest pain. Stress echocardiogram revealed normal left ventricular function without evidence of ischemia. Was evaluated 02/24/23 - oncology - [facility].  9 years out - from breast cancer diagnosis.  Off anti hormone therapy for one year.  Graduated from the [facility] to survivorship program.  Dexa 12/2021 - osteopenia - stable.  Sleep has been an issue the last few weeks.  Feels is related to increased stress - husband's health issues.  Trouble falling asleep.  Using cpap.  No chest pain reported.  Breathing stable. No abdominal pain or bowel change.    Past Medical History:  Diagnosis Date   Abdominal hernia    Anxiety    situational stress and anxiety   Cancer (HCC) 2015   breast    Cataract cortical, senile, unspecified laterality    Chest pain    Exertional dyspnea    GERD (gastroesophageal reflux disease)    Hiatal hernia    Hiatal hernia    Hypercholesterolemia    Hyperlipidemia    Hypertension    Osteoarthritis    scoliosis, degenerative disc dz, knee, carpal tunnel   PONV (postoperative nausea and vomiting)    Sleep apnea    Past Surgical History:  Procedure Laterality Date   ABDOMINAL HYSTERECTOMY  07/2000   APPENDECTOMY  2000   APPENDECTOMY     BREAST BIOPSY Bilateral 2014   BREAST SURGERY  Right 2015   lumpectomy   CARPAL TUNNEL RELEASE     CATARACT EXTRACTION W/PHACO Right 02/16/2023   Procedure: CATARACT EXTRACTION PHACO AND INTRAOCULAR LENS PLACEMENT (IOC) RIGHT  7.20  00:39.2;  Surgeon: Lockie Mola, MD;  Location: [facility];  Service: Ophthalmology;  Laterality: Right;   CATARACT EXTRACTION W/PHACO Left 03/02/2023   Procedure: CATARACT EXTRACTION PHACO AND INTRAOCULAR LENS PLACEMENT (IOC) LEFT  6.41  00:42.0;  Surgeon: Lockie Mola, MD;  Location: [facility];  Service: Ophthalmology;  Laterality: Left;   COLONOSCOPY  2011   Barnetta Chapel, [name]: Normal exam   COLONOSCOPY WITH PROPOFOL N/A 04/03/2020   Procedure: COLONOSCOPY WITH PROPOFOL;  Surgeon: Toledo, Boykin Nearing, MD;  Location: [facility];  Service: Gastroenterology;  Laterality: N/A;   ESOPHAGOGASTRODUODENOSCOPY (EGD) WITH PROPOFOL N/A 04/03/2020   Procedure: ESOPHAGOGASTRODUODENOSCOPY (EGD) WITH PROPOFOL;  Surgeon: Toledo, Boykin Nearing, MD;  Location: [facility];  Service: Gastroenterology;  Laterality: N/A;   HERNIA REPAIR  02/10/15   Laparoscopic Repair of Lateral Abdominal Wall Hernia  02/10/15   MASTECTOMY     UPPER GI ENDOSCOPY  05/10/2014   Barnetta Chapel, [name] Bile gastritis, medium hiatal hernia. Incomplete visualization of the cardia and fundus.   Family History  Problem Relation Age of Onset   Diabetes Mother    Multiple myeloma Mother    Multiple myeloma Father    Diabetes Sister  x2   Diabetes Brother    Hypertension Brother    Dementia Brother    Social History   Socioeconomic History   Marital status: Married    Spouse name: Not on file   Number of children: 2   Years of education: Not on file   Highest education level: 12th grade  Occupational History   Not on file  Tobacco Use   Smoking status: Never   Smokeless tobacco: Never  Vaping Use   Vaping Use: Never used  Substance and Sexual Activity   Alcohol use: Yes    Alcohol/week: 0.0  standard drinks of alcohol    Comment: wine occasionally   Drug use: No   Sexual activity: Yes  Other Topics Concern   Not on file  Social History Narrative   Not on file   Social Determinants of Health   Financial Resource Strain: Low Risk  (04/25/2023)   Overall Financial Resource Strain (CARDIA)    Difficulty of Paying Living Expenses: Not very hard  Food Insecurity: No Food Insecurity (04/25/2023)   Hunger Vital Sign    Worried About Running Out of Food in the Last Year: Never true    Ran Out of Food in the Last Year: Never true  Transportation Needs: No Transportation Needs (04/25/2023)   PRAPARE - Administrator, Civil Service (Medical): No    Lack of Transportation (Non-Medical): No  Physical Activity: Insufficiently Active (06/09/2022)   Exercise Vital Sign    Days of Exercise per Week: 3 days    Minutes of Exercise per Session: 30 min  Stress: Stress Concern Present (04/25/2023)   [employer] of Occupational Health - Occupational Stress Questionnaire    Feeling of Stress : To some extent  Social Connections: Socially Integrated (04/25/2023)   Social Connection and Isolation Panel [NHANES]    Frequency of Communication with Friends and Family: More than three times a week    Frequency of Social Gatherings with Friends and Family: Once a week    Attends Religious Services: More than 4 times per year    Active Member of [employer] or Organizations: Yes    Attends Engineer, structural: More than 4 times per year    Marital Status: Married     Review of Systems  Constitutional:  Negative for appetite change and unexpected weight change.  HENT:  Negative for congestion and sinus pressure.   Respiratory:  Negative for cough, chest tightness and shortness of breath.   Cardiovascular:  Negative for chest pain and palpitations.  Gastrointestinal:  Negative for abdominal pain, diarrhea, nausea and vomiting.  Genitourinary:  Negative for difficulty urinating and  dysuria.  Musculoskeletal:  Positive for back pain. Negative for myalgias.  Skin:  Negative for color change and rash.  Neurological:  Negative for dizziness and headaches.  Psychiatric/Behavioral:  Negative for agitation and dysphoric mood.        Objective:     BP 122/70   Pulse 66   Temp 98.2 F (36.8 C)   Resp 16   Ht 5' (1.524 m)   Wt 143 lb 9.6 oz (65.1 kg)   SpO2 99%   BMI 28.04 kg/m  Wt Readings from Last 3 Encounters:  04/28/23 143 lb 9.6 oz (65.1 kg)  03/02/23 141 lb (64 kg)  02/16/23 140 lb 14.4 oz (63.9 kg)    Physical Exam Vitals reviewed.  Constitutional:      General: She is not in acute distress.  Appearance: Normal appearance.  HENT:     Head: Normocephalic and atraumatic.     Right Ear: External ear normal.     Left Ear: External ear normal.  Eyes:     General: No scleral icterus.       Right eye: No discharge.        Left eye: No discharge.     Conjunctiva/sclera: Conjunctivae normal.  Neck:     Thyroid: No thyromegaly.  Cardiovascular:     Rate and Rhythm: Normal rate and regular rhythm.  Pulmonary:     Effort: No respiratory distress.     Breath sounds: Normal breath sounds. No wheezing.  Abdominal:     General: Bowel sounds are normal.     Palpations: Abdomen is soft.     Tenderness: There is no abdominal tenderness.  Musculoskeletal:        General: No swelling or tenderness.     Cervical back: Neck supple. No tenderness.  Lymphadenopathy:     Cervical: No cervical adenopathy.  Skin:    Findings: No erythema or rash.  Neurological:     Mental Status: She is alert.  Psychiatric:        Mood and Affect: Mood normal.        Behavior: Behavior normal.      Outpatient Encounter Medications as of 04/28/2023  Medication Sig   Ascorbic Acid (VITAMIN C) 1000 MG tablet Take 1,000 mg by mouth daily.   aspirin 81 MG tablet Take 81 mg by mouth daily.   Azelastine HCl 137 MCG/SPRAY SOLN INSTILL TWO SPRAYS INTO EACH NOSTIL TWICE A DAY AS  DIRECTED.   Biotin 5 MG CAPS Take by mouth daily.   calcium citrate-vitamin D 500-400 MG-UNIT chewable tablet Chew 1 tablet by mouth 2 (two) times daily.   cholecalciferol (VITAMIN D) 1000 UNITS tablet Take 2,000 Units by mouth in the morning and at bedtime.    fluticasone (FLONASE) 50 MCG/ACT nasal spray Place 2 sprays into both nostrils daily.   gabapentin (NEURONTIN) 100 MG capsule Take 2 capsules (200 mg total) by mouth at bedtime.   irbesartan (AVAPRO) 75 MG tablet Take 1 tablet (75 mg total) by mouth daily.   levocetirizine (XYZAL) 5 MG tablet TAKE 1 TABLET EVERY EVENING   lidocaine (LIDODERM) 5 % Place 1 patch onto the skin as needed. Remove & Discard patch within 12 hours or as directed by MD   Magnesium Glycinate 100 MG CAPS Take 200 mg by mouth daily.   methocarbamol (ROBAXIN) 500 MG tablet Take 1 tablet (500 mg total) by mouth at bedtime as needed for muscle spasms. (Patient taking differently: Take 500 mg by mouth at bedtime as needed for muscle spasms. Pt taking prn)   Misc Natural Products (TART CHERRY ADVANCED) CAPS Take 1 capsule by mouth 2 (two) times daily. Reported on 03/04/2016   MULTIPLE MINERALS-VITAMINS PO    PEPPERMINT OIL PO Take by mouth.   RABEprazole (ACIPHEX) 20 MG tablet Take 1 tablet (20 mg total) by mouth 2 (two) times daily.   rosuvastatin (CRESTOR) 5 MG tablet Take 1 tablet (5 mg total) by mouth daily.   sucralfate (CARAFATE) 1 g tablet Take 1 tablet (1 g total) by mouth 2 (two) times daily.   triamterene-hydrochlorothiazide (MAXZIDE-25) 37.5-25 MG tablet TAKE 1/2 TABLET EVERY DAY   [DISCONTINUED] fluticasone (FLONASE) 50 MCG/ACT nasal spray USE 2 SPRAYS IN EACH NOSTRIL EVERY DAY   [DISCONTINUED] gabapentin (NEURONTIN) 100 MG capsule TAKE 2 CAPSULES AT BEDTIME   [  DISCONTINUED] irbesartan (AVAPRO) 75 MG tablet TAKE 1 TABLET EVERY DAY   [DISCONTINUED] rosuvastatin (CRESTOR) 5 MG tablet Take 1 tablet (5 mg total) by mouth daily.   No facility-administered  encounter medications on file as of 04/28/2023.     Lab Results  Component Value Date   WBC 5.8 12/29/2022   HGB 13.4 12/29/2022   HCT 40.0 12/29/2022   PLT 275.0 12/29/2022   GLUCOSE 91 04/26/2023   CHOL 165 04/26/2023   TRIG 101.0 04/26/2023   HDL 73.20 04/26/2023   LDLDIRECT 146.9 01/31/2014   LDLCALC 72 04/26/2023   ALT 18 04/26/2023   AST 17 04/26/2023   NA 139 04/26/2023   K 3.9 04/26/2023   CL 103 04/26/2023   CREATININE 0.91 04/26/2023   BUN 22 04/26/2023   CO2 28 04/26/2023   TSH 3.03 04/26/2023   HGBA1C 6.2 04/26/2023    No results found.     Assessment & Plan:  Hyperglycemia Assessment & Plan: Low carb diet and exercise.  Follow met b and a1c.    Orders: -     Hemoglobin A1c; Future  Hypercholesterolemia Assessment & Plan: Continue crestor.  Low cholesterol diet and exercise.  Follow lipid panel and liver function tests.    Orders: -     Lipid panel; Future -     Hepatic function panel; Future -     Basic metabolic panel; Future  Disorder of bone density and structure, unspecified -     VITAMIN D 25 Hydroxy (Vit-D Deficiency, Fractures); Future  Low back pain, unspecified back pain laterality, unspecified chronicity, unspecified whether sciatica present Assessment & Plan: Seeing Dr Mickeal Needy - f/u neck and back pain.  S/p ESI. Recommended continuing tylenol, gabapentin and tramadol prn.    Gastroesophageal reflux disease without esophagitis Assessment & Plan: Has a large paraesophageal hernia.  On aciphex and carafate.  Has seen GI.  Recently evaluated by Dr Sedonia Small.  Elected watchful waiting.     History of breast cancer Assessment & Plan: Mammogram 02/24/23- Birads II.    Primary hypertension Assessment & Plan: Continue avapro and triam/hctz.  Blood pressures as outlined.  Follow pressures.  Follow metabolic panel.    Obstructive sleep apnea Assessment & Plan: Pt is using cpap regularly and is benefiting from continued use.   ([facility])   Stress Assessment & Plan: Overall appears to be handling things relatively well.  Follow.    Other orders -     Gabapentin; Take 2 capsules (200 mg total) by mouth at bedtime.  Dispense: 180 capsule; Refill: 1 -     Irbesartan; Take 1 tablet (75 mg total) by mouth daily.  Dispense: 90 tablet; Refill: 3 -     Rosuvastatin Calcium; Take 1 tablet (5 mg total) by mouth daily.  Dispense: 90 tablet; Refill: 3 -     Fluticasone Propionate; Place 2 sprays into both nostrils daily.  Dispense: 48 g; Refill: 1     Dale [place], MD

## 2023-05-01 ENCOUNTER — Encounter: Payer: Self-pay | Admitting: Internal Medicine

## 2023-05-01 NOTE — Assessment & Plan Note (Signed)
Low carb diet and exercise.  Follow met b and a1c.   

## 2023-05-01 NOTE — Assessment & Plan Note (Signed)
Mammogram 02/24/23- Birads II.

## 2023-05-01 NOTE — Assessment & Plan Note (Signed)
Overall appears to be handling things relatively well.  Follow.   

## 2023-05-01 NOTE — Assessment & Plan Note (Signed)
Seeing Dr Mickeal Needy - f/u neck and back pain.  S/p ESI. Recommended continuing tylenol, gabapentin and tramadol prn.

## 2023-05-01 NOTE — Assessment & Plan Note (Signed)
Has a large paraesophageal hernia.  On aciphex and carafate.  Has seen GI.  Recently evaluated by Dr Tim Farrell.  Elected watchful waiting.   ?

## 2023-05-01 NOTE — Assessment & Plan Note (Signed)
Continue avapro and triam/hctz.  Blood pressures as outlined.  Follow pressures.  Follow metabolic panel.  °

## 2023-05-01 NOTE — Assessment & Plan Note (Signed)
Continue crestor.  Low cholesterol diet and exercise.  Follow lipid panel and liver function tests.  

## 2023-05-01 NOTE — Assessment & Plan Note (Signed)
Pt is using cpap regularly and is benefiting from continued use.  ([facility]) 

## 2023-05-17 ENCOUNTER — Other Ambulatory Visit: Payer: Self-pay

## 2023-05-17 MED ORDER — AZELASTINE HCL 137 MCG/SPRAY NA SOLN: NASAL | 3 refills | Status: DC

## 2023-06-17 DIAGNOSIS — M48062 Spinal stenosis, lumbar region with neurogenic claudication: Secondary | ICD-10-CM | POA: Diagnosis not present

## 2023-06-17 DIAGNOSIS — M5416 Radiculopathy, lumbar region: Secondary | ICD-10-CM | POA: Diagnosis not present

## 2023-07-12 DIAGNOSIS — M4802 Spinal stenosis, cervical region: Secondary | ICD-10-CM | POA: Diagnosis not present

## 2023-07-12 DIAGNOSIS — M5412 Radiculopathy, cervical region: Secondary | ICD-10-CM | POA: Diagnosis not present

## 2023-08-04 ENCOUNTER — Encounter (INDEPENDENT_AMBULATORY_CARE_PROVIDER_SITE_OTHER): Payer: Self-pay

## 2023-08-09 DIAGNOSIS — M48062 Spinal stenosis, lumbar region with neurogenic claudication: Secondary | ICD-10-CM | POA: Diagnosis not present

## 2023-08-09 DIAGNOSIS — M5416 Radiculopathy, lumbar region: Secondary | ICD-10-CM | POA: Diagnosis not present

## 2023-08-09 DIAGNOSIS — M4802 Spinal stenosis, cervical region: Secondary | ICD-10-CM | POA: Diagnosis not present

## 2023-08-09 DIAGNOSIS — M5030 Other cervical disc degeneration, unspecified cervical region: Secondary | ICD-10-CM | POA: Diagnosis not present

## 2023-08-09 DIAGNOSIS — M5412 Radiculopathy, cervical region: Secondary | ICD-10-CM | POA: Diagnosis not present

## 2023-08-09 DIAGNOSIS — M5136 Other intervertebral disc degeneration, lumbar region: Secondary | ICD-10-CM | POA: Diagnosis not present

## 2023-08-15 ENCOUNTER — Telehealth: Payer: Self-pay | Admitting: Internal Medicine

## 2023-08-15 NOTE — Telephone Encounter (Signed)
Copied from CRM [id number]. Topic: Medicare AWV >> Aug 15, 2023  1:47 PM Payton Doughty wrote: Reason for CRM: Called 08/15/2023 to sched AWV - NO VOICEMAIL  Verlee Rossetti; Care Guide Ambulatory Clinical Support [place] [facility] Direct Dial: [phone]

## 2023-08-23 DIAGNOSIS — M542 Cervicalgia: Secondary | ICD-10-CM | POA: Diagnosis not present

## 2023-08-23 DIAGNOSIS — M25519 Pain in unspecified shoulder: Secondary | ICD-10-CM | POA: Diagnosis not present

## 2023-08-23 DIAGNOSIS — M545 Low back pain, unspecified: Secondary | ICD-10-CM | POA: Diagnosis not present

## 2023-08-24 ENCOUNTER — Telehealth: Payer: Self-pay | Admitting: Internal Medicine

## 2023-08-24 NOTE — Telephone Encounter (Signed)
[facility] called in to check status of c-pap supplies paperwork that were fax to [place] last week. Any questions or concern, they are available @ [phone].

## 2023-08-26 ENCOUNTER — Other Ambulatory Visit (INDEPENDENT_AMBULATORY_CARE_PROVIDER_SITE_OTHER): Payer: Medicare HMO

## 2023-08-26 DIAGNOSIS — R739 Hyperglycemia, unspecified: Secondary | ICD-10-CM | POA: Diagnosis not present

## 2023-08-26 DIAGNOSIS — M859 Disorder of bone density and structure, unspecified: Secondary | ICD-10-CM | POA: Diagnosis not present

## 2023-08-26 DIAGNOSIS — E7800 Pure hypercholesterolemia, unspecified: Secondary | ICD-10-CM | POA: Diagnosis not present

## 2023-08-26 LAB — BASIC METABOLIC PANEL
BUN: 24 mg/dL — ABNORMAL HIGH (ref 6–23)
CO2: 27 meq/L (ref 19–32)
Calcium: 9.6 mg/dL (ref 8.4–10.5)
Chloride: 101 meq/L (ref 96–112)
Creatinine, Ser: 1.1 mg/dL (ref 0.40–1.20)
GFR: 47.78 mL/min — ABNORMAL LOW (ref >60.00)
Glucose, Bld: 94 mg/dL (ref 70–99)
Potassium: 4 meq/L (ref 3.5–5.1)
Sodium: 136 meq/L (ref 135–145)

## 2023-08-26 LAB — LIPID PANEL
Cholesterol: 151 mg/dL (ref 0–200)
HDL: 69.5 mg/dL (ref >39.00)
LDL Cholesterol: 61 mg/dL (ref 0–99)
NonHDL: 81.34
Total CHOL/HDL Ratio: 2
Triglycerides: 100 mg/dL (ref 0.0–149.0)
VLDL: 20 mg/dL (ref 0.0–40.0)

## 2023-08-26 LAB — HEPATIC FUNCTION PANEL
ALT: 17 U/L (ref 0–35)
AST: 19 U/L (ref 0–37)
Albumin: 4 g/dL (ref 3.5–5.2)
Alkaline Phosphatase: 47 U/L (ref 39–117)
Bilirubin, Direct: 0.1 mg/dL (ref 0.0–0.3)
Total Bilirubin: 0.5 mg/dL (ref 0.2–1.2)
Total Protein: 6.4 g/dL (ref 6.0–8.3)

## 2023-08-26 LAB — VITAMIN D 25 HYDROXY (VIT D DEFICIENCY, FRACTURES): VITD: 98.83 ng/mL (ref 30.00–100.00)

## 2023-08-26 LAB — HEMOGLOBIN A1C: Hgb A1c MFr Bld: 6 % (ref 4.6–6.5)

## 2023-08-26 NOTE — Telephone Encounter (Signed)
Called to confirm patient requested supplies. Filled out form and placed out for signature

## 2023-08-26 NOTE — Telephone Encounter (Signed)
Faxed

## 2023-08-26 NOTE — Telephone Encounter (Signed)
Signed and placed in box.   

## 2023-08-30 DIAGNOSIS — K5900 Constipation, unspecified: Secondary | ICD-10-CM | POA: Diagnosis not present

## 2023-08-30 DIAGNOSIS — R1013 Epigastric pain: Secondary | ICD-10-CM | POA: Diagnosis not present

## 2023-08-30 DIAGNOSIS — G8929 Other chronic pain: Secondary | ICD-10-CM | POA: Diagnosis not present

## 2023-08-30 DIAGNOSIS — M549 Dorsalgia, unspecified: Secondary | ICD-10-CM | POA: Diagnosis not present

## 2023-08-31 ENCOUNTER — Ambulatory Visit (INDEPENDENT_AMBULATORY_CARE_PROVIDER_SITE_OTHER): Payer: Medicare HMO | Admitting: Internal Medicine

## 2023-08-31 ENCOUNTER — Encounter: Payer: Self-pay | Admitting: Internal Medicine

## 2023-08-31 VITALS — BP 120/58 | HR 65 | Temp 98.0°F | Resp 17 | Ht 60.0 in | Wt 144.4 lb

## 2023-08-31 DIAGNOSIS — M545 Low back pain, unspecified: Secondary | ICD-10-CM | POA: Diagnosis not present

## 2023-08-31 DIAGNOSIS — R739 Hyperglycemia, unspecified: Secondary | ICD-10-CM

## 2023-08-31 DIAGNOSIS — M542 Cervicalgia: Secondary | ICD-10-CM | POA: Diagnosis not present

## 2023-08-31 DIAGNOSIS — G4733 Obstructive sleep apnea (adult) (pediatric): Secondary | ICD-10-CM | POA: Diagnosis not present

## 2023-08-31 DIAGNOSIS — K219 Gastro-esophageal reflux disease without esophagitis: Secondary | ICD-10-CM

## 2023-08-31 DIAGNOSIS — R944 Abnormal results of kidney function studies: Secondary | ICD-10-CM | POA: Diagnosis not present

## 2023-08-31 DIAGNOSIS — E7800 Pure hypercholesterolemia, unspecified: Secondary | ICD-10-CM

## 2023-08-31 DIAGNOSIS — M25519 Pain in unspecified shoulder: Secondary | ICD-10-CM | POA: Diagnosis not present

## 2023-08-31 DIAGNOSIS — Z853 Personal history of malignant neoplasm of breast: Secondary | ICD-10-CM

## 2023-08-31 DIAGNOSIS — I10 Essential (primary) hypertension: Secondary | ICD-10-CM

## 2023-08-31 DIAGNOSIS — M79606 Pain in leg, unspecified: Secondary | ICD-10-CM

## 2023-08-31 DIAGNOSIS — F439 Reaction to severe stress, unspecified: Secondary | ICD-10-CM

## 2023-08-31 MED ORDER — TRIAMTERENE-HCTZ 37.5-25 MG PO TABS: 0.5000 | ORAL_TABLET | Freq: Every day | ORAL | 1 refills | Status: DC

## 2023-08-31 NOTE — Progress Notes (Signed)
Subjective:    Patient ID: Carla Ryan, female    DOB: [birth year], 77 y.o.   MRN: [id number]  Patient here for  Chief Complaint  Patient presents with   Medical Management of Chronic Issues    Follow-up    HPI Here for follow up appt - f/u regarding hypertension and hypercholesterolemia.  Using cpap. Seeing Dr Mickeal Needy - f/u neck and back pain.  S/p left L3-4 and L4-5 - ESI 06/17/23. S/p right C4-5 ESI 07/12/23. Had f/u with [facility] 08/09/23. Reported good relief following ESI to the lumbar spine, but increased pain to c-spine. Recommended PT and prednisone taper. Also recommended continuing tylenol, gabapentin and tramadol prn. Order placed for L3-4 and L4-5 ESI 09/17/23. Saw cardiology 01/27/23 - pre op evaluation - right side chest pain. Stress echocardiogram revealed normal left ventricular function without evidence of ischemia. Was evaluated 02/24/23 - oncology - [facility].  9 years out - from breast cancer diagnosis.  Off anti hormone therapy for one year.  Graduated from the [facility] to survivorship [facility].  Dexa 12/2021 - osteopenia - stable. Was evaluated yesterday - GI - epigastric pain. Reports cpap - more dry now. Chamber not as large  - discuss with company that provides her supplies.  No chest pain.  Breathing stable.     Past Medical History:  Diagnosis Date   Abdominal hernia    Anxiety    situational stress and anxiety   Cancer (HCC) 2015   breast    Cataract cortical, senile, unspecified laterality    Chest pain    Exertional dyspnea    GERD (gastroesophageal reflux disease)    Hiatal hernia    Hiatal hernia    Hypercholesterolemia    Hyperlipidemia    Hypertension    Osteoarthritis    scoliosis, degenerative disc dz, knee, carpal tunnel   PONV (postoperative nausea and vomiting)    Sleep apnea    Past Surgical History:  Procedure Laterality Date   ABDOMINAL HYSTERECTOMY  07/2000   APPENDECTOMY  2000   APPENDECTOMY     BREAST  BIOPSY Bilateral 2014   BREAST SURGERY Right 2015   lumpectomy   CARPAL TUNNEL RELEASE     CATARACT EXTRACTION W/PHACO Right 02/16/2023   Procedure: CATARACT EXTRACTION PHACO AND INTRAOCULAR LENS PLACEMENT (IOC) RIGHT  7.20  00:39.2;  Surgeon: Lockie Mola, MD;  Location: [facility];  Service: Ophthalmology;  Laterality: Right;   CATARACT EXTRACTION W/PHACO Left 03/02/2023   Procedure: CATARACT EXTRACTION PHACO AND INTRAOCULAR LENS PLACEMENT (IOC) LEFT  6.41  00:42.0;  Surgeon: Lockie Mola, MD;  Location: [facility];  Service: Ophthalmology;  Laterality: Left;   COLONOSCOPY  2011   Barnetta Chapel, [name]: Normal exam   COLONOSCOPY WITH PROPOFOL N/A 04/03/2020   Procedure: COLONOSCOPY WITH PROPOFOL;  Surgeon: Toledo, Boykin Nearing, MD;  Location: [facility];  Service: Gastroenterology;  Laterality: N/A;   ESOPHAGOGASTRODUODENOSCOPY (EGD) WITH PROPOFOL N/A 04/03/2020   Procedure: ESOPHAGOGASTRODUODENOSCOPY (EGD) WITH PROPOFOL;  Surgeon: Toledo, Boykin Nearing, MD;  Location: [facility];  Service: Gastroenterology;  Laterality: N/A;   HERNIA REPAIR  02/10/15   Laparoscopic Repair of Lateral Abdominal Wall Hernia  02/10/15   MASTECTOMY     UPPER GI ENDOSCOPY  05/10/2014   Barnetta Chapel, [name] Bile gastritis, medium hiatal hernia. Incomplete visualization of the cardia and fundus.   Family History  Problem Relation Age of Onset   Diabetes Mother    Multiple myeloma Mother    Multiple  myeloma Father    Diabetes Sister        x2   Diabetes Brother    Hypertension Brother    Dementia Brother    Social History   Socioeconomic History   Marital status: Married    Spouse name: Not on file   Number of children: 2   Years of education: Not on file   Highest education level: 12th grade  Occupational History   Not on file  Tobacco Use   Smoking status: Never   Smokeless tobacco: Never  Vaping Use   Vaping status: Never Used  Substance and Sexual Activity    Alcohol use: Yes    Alcohol/week: 0.0 standard drinks of alcohol    Comment: wine occasionally   Drug use: No   Sexual activity: Yes  Other Topics Concern   Not on file  Social History Narrative   Not on file   Social Determinants of Health   Financial Resource Strain: Low Risk  (04/25/2023)   Overall Financial Resource Strain (CARDIA)    Difficulty of Paying Living Expenses: Not very hard  Food Insecurity: No Food Insecurity (04/25/2023)   Hunger Vital Sign    Worried About Running Out of Food in the Last Year: Never true    Ran Out of Food in the Last Year: Never true  Transportation Needs: No Transportation Needs (04/25/2023)   PRAPARE - Administrator, Civil Service (Medical): No    Lack of Transportation (Non-Medical): No  Physical Activity: Insufficiently Active (06/09/2022)   Exercise Vital Sign    Days of Exercise per Week: 3 days    Minutes of Exercise per Session: 30 min  Stress: Stress Concern Present (04/25/2023)   [employer] of Occupational Health - Occupational Stress Questionnaire    Feeling of Stress : To some extent  Social Connections: Socially Integrated (04/25/2023)   Social Connection and Isolation Panel [NHANES]    Frequency of Communication with Friends and Family: More than three times a week    Frequency of Social Gatherings with Friends and Family: Once a week    Attends Religious Services: More than 4 times per year    Active Member of [employer] or Organizations: Yes    Attends Engineer, structural: More than 4 times per year    Marital Status: Married     Review of Systems  Constitutional:  Negative for appetite change and unexpected weight change.  HENT:  Negative for congestion and sinus pressure.   Respiratory:  Negative for cough, chest tightness and shortness of breath.   Cardiovascular:  Negative for chest pain and palpitations.  Gastrointestinal:  Negative for abdominal pain, diarrhea, nausea and vomiting.  Genitourinary:   Negative for difficulty urinating and dysuria.  Musculoskeletal:  Negative for joint swelling and myalgias.  Skin:  Negative for color change and rash.  Neurological:  Negative for dizziness and headaches.  Psychiatric/Behavioral:  Negative for agitation and dysphoric mood.        Objective:     BP (!) 120/58   Pulse 65   Temp 98 F (36.7 C) (Oral)   Resp 17   Ht 5' (1.524 m)   Wt 144 lb 6 oz (65.5 kg)   SpO2 95%   BMI 28.20 kg/m  Wt Readings from Last 3 Encounters:  08/31/23 144 lb 6 oz (65.5 kg)  04/28/23 143 lb 9.6 oz (65.1 kg)  03/02/23 141 lb (64 kg)    Physical Exam Vitals reviewed.  Constitutional:  General: She is not in acute distress.    Appearance: Normal appearance.  HENT:     Head: Normocephalic and atraumatic.     Right Ear: External ear normal.     Left Ear: External ear normal.  Eyes:     General: No scleral icterus.       Right eye: No discharge.        Left eye: No discharge.     Conjunctiva/sclera: Conjunctivae normal.  Neck:     Thyroid: No thyromegaly.  Cardiovascular:     Rate and Rhythm: Normal rate and regular rhythm.  Pulmonary:     Effort: No respiratory distress.     Breath sounds: Normal breath sounds. No wheezing.  Abdominal:     General: Bowel sounds are normal.     Palpations: Abdomen is soft.     Tenderness: There is no abdominal tenderness.  Musculoskeletal:        General: No swelling or tenderness.     Cervical back: Neck supple. No tenderness.  Lymphadenopathy:     Cervical: No cervical adenopathy.  Skin:    Findings: No erythema or rash.  Neurological:     Mental Status: She is alert.  Psychiatric:        Mood and Affect: Mood normal.        Behavior: Behavior normal.      Outpatient Encounter Medications as of 08/31/2023  Medication Sig   Ascorbic Acid (VITAMIN C) 1000 MG tablet Take 1,000 mg by mouth daily.   aspirin 81 MG tablet Take 81 mg by mouth daily.   Azelastine HCl 137 MCG/SPRAY SOLN INSTILL TWO  SPRAYS INTO EACH NOSTIL TWICE A DAY AS DIRECTED.   Biotin 5 MG CAPS Take by mouth daily.   calcium citrate-vitamin D 500-400 MG-UNIT chewable tablet Chew 1 tablet by mouth 2 (two) times daily.   cholecalciferol (VITAMIN D) 1000 UNITS tablet Take 2,000 Units by mouth in the morning and at bedtime.    fluticasone (FLONASE) 50 MCG/ACT nasal spray Place 2 sprays into both nostrils daily.   gabapentin (NEURONTIN) 100 MG capsule Take 2 capsules (200 mg total) by mouth at bedtime.   irbesartan (AVAPRO) 75 MG tablet Take 1 tablet (75 mg total) by mouth daily.   levocetirizine (XYZAL) 5 MG tablet TAKE 1 TABLET EVERY EVENING   lidocaine (LIDODERM) 5 % Place 1 patch onto the skin as needed. Remove & Discard patch within 12 hours or as directed by MD   Magnesium Glycinate 100 MG CAPS Take 200 mg by mouth daily.   methocarbamol (ROBAXIN) 500 MG tablet Take 1 tablet (500 mg total) by mouth at bedtime as needed for muscle spasms. (Patient taking differently: Take 500 mg by mouth at bedtime as needed for muscle spasms. Pt taking prn)   Misc Natural Products (TART CHERRY ADVANCED) CAPS Take 1 capsule by mouth 2 (two) times daily. Reported on 03/04/2016   MULTIPLE MINERALS-VITAMINS PO    PEPPERMINT OIL PO Take by mouth.   predniSONE (DELTASONE) 10 MG tablet 6 day taper - Take as directed   RABEprazole (ACIPHEX) 20 MG tablet Take 1 tablet (20 mg total) by mouth 2 (two) times daily.   rosuvastatin (CRESTOR) 5 MG tablet Take 1 tablet (5 mg total) by mouth daily.   sucralfate (CARAFATE) 1 g tablet Take 1 tablet (1 g total) by mouth 2 (two) times daily.   triamterene-hydrochlorothiazide (MAXZIDE-25) 37.5-25 MG tablet Take 0.5 tablets by mouth daily.   [DISCONTINUED] triamterene-hydrochlorothiazide (MAXZIDE-25) 37.5-25  MG tablet TAKE 1/2 TABLET EVERY DAY   No facility-administered encounter medications on file as of 08/31/2023.     Lab Results  Component Value Date   WBC 5.8 12/29/2022   HGB 13.4 12/29/2022   HCT  40.0 12/29/2022   PLT 275.0 12/29/2022   GLUCOSE 94 08/26/2023   CHOL 151 08/26/2023   TRIG 100.0 08/26/2023   HDL 69.50 08/26/2023   LDLDIRECT 146.9 01/31/2014   LDLCALC 61 08/26/2023   ALT 17 08/26/2023   AST 19 08/26/2023   NA 136 08/26/2023   K 4.0 08/26/2023   CL 101 08/26/2023   CREATININE 1.10 08/26/2023   BUN 24 (H) 08/26/2023   CO2 27 08/26/2023   TSH 3.03 04/26/2023   HGBA1C 6.0 08/26/2023       Assessment & Plan:  Hyperglycemia Assessment & Plan: Low carb diet and exercise.  Follow met b and a1c.    Orders: -     Hemoglobin A1c; Future  Hypercholesterolemia Assessment & Plan: Continue crestor.  Low cholesterol diet and exercise.  Follow lipid panel and liver function tests.    Orders: -     Lipid panel; Future -     Hepatic function panel; Future  Primary hypertension Assessment & Plan: Continue avapro and triam/hctz.  Blood pressures as outlined.  Follow pressures.  Follow metabolic panel.   Orders: -     CBC with Differential/Platelet; Future -     Basic metabolic panel; Future  Decreased GFR -     Basic metabolic panel; Future  Low back pain, unspecified back pain laterality, unspecified chronicity, unspecified whether sciatica present Assessment & Plan:  Seeing Dr Mickeal Needy - f/u neck and back pain.  S/p left L3-4 and L4-5 - ESI 06/17/23. S/p right C4-5 ESI 07/12/23. Had f/u with [facility] 08/09/23. Reported good relief following ESI to the lumbar spine, but increased pain to c-spine. Recommended PT and prednisone taper. Also recommended continuing tylenol, gabapentin and tramadol prn. Order placed for L3-4 and L4-5 ESI 09/17/23.    Neck pain Assessment & Plan:  Seeing Dr Mickeal Needy - f/u neck and back pain.  S/p left L3-4 and L4-5 - ESI 06/17/23. S/p right C4-5 ESI 07/12/23. Had f/u with [facility] 08/09/23. Reported good relief following ESI to the lumbar spine, but increased pain to c-spine. Recommended PT and prednisone taper.  Also recommended continuing tylenol, gabapentin and tramadol prn. Order placed for L3-4 and L4-5 ESI 09/17/23.    Stress Assessment & Plan: Overall appears to be handling things relatively well.  Follow.    Obstructive sleep apnea Assessment & Plan: Pt is using cpap regularly. ([employer]).  Dry as outlined.  Discuss with supply company.    Pain of lower extremity, unspecified laterality Assessment & Plan:  Seeing Dr Mickeal Needy - f/u neck and back pain.  S/p left L3-4 and L4-5 - ESI 06/17/23. S/p right C4-5 ESI 07/12/23. Had f/u with [facility] 08/09/23. Reported good relief following ESI to the lumbar spine, but increased pain to c-spine. Recommended PT and prednisone taper. Also recommended continuing tylenol, gabapentin and tramadol prn. Order placed for L3-4 and L4-5 ESI 09/17/23.    History of breast cancer Assessment & Plan: Mammogram 02/24/23- [facility].    Gastroesophageal reflux disease without esophagitis Assessment & Plan: Has a large paraesophageal hernia.  On aciphex and carafate.  Has seen GI.  Evaluated by Dr Sedonia Small.  Elected watchful waiting.     Other orders -     Triamterene-HCTZ;  Take 0.5 tablets by mouth daily.  Dispense: 45 tablet; Refill: 1     [place], MD

## 2023-09-04 ENCOUNTER — Encounter: Payer: Self-pay | Admitting: Internal Medicine

## 2023-09-04 DIAGNOSIS — M542 Cervicalgia: Secondary | ICD-10-CM | POA: Insufficient documentation

## 2023-09-04 NOTE — Assessment & Plan Note (Signed)
Continue avapro and triam/hctz.  Blood pressures as outlined.  Follow pressures.  Follow metabolic panel.

## 2023-09-04 NOTE — Assessment & Plan Note (Signed)
Overall appears to be handling things relatively well.  Follow.   

## 2023-09-04 NOTE — Assessment & Plan Note (Signed)
Seeing Dr Mickeal Needy - f/u neck and back pain.  S/p left L3-4 and L4-5 - ESI 06/17/23. S/p right C4-5 ESI 07/12/23. Had f/u with [facility] 08/09/23. Reported good relief following ESI to the lumbar spine, but increased pain to c-spine. Recommended PT and prednisone taper. Also recommended continuing tylenol, gabapentin and tramadol prn. Order placed for L3-4 and L4-5 ESI 09/17/23.

## 2023-09-04 NOTE — Assessment & Plan Note (Signed)
Low carb diet and exercise.  Follow met b and a1c.

## 2023-09-04 NOTE — Assessment & Plan Note (Signed)
Pt is using cpap regularly. ([employer]).  Dry as outlined.  Discuss with supply company.

## 2023-09-04 NOTE — Assessment & Plan Note (Signed)
Mammogram 02/24/23- Birads II.

## 2023-09-04 NOTE — Assessment & Plan Note (Signed)
Continue crestor.  Low cholesterol diet and exercise.  Follow lipid panel and liver function tests.  

## 2023-09-04 NOTE — Assessment & Plan Note (Signed)
Has a large paraesophageal hernia.  On aciphex and carafate.  Has seen GI.  Evaluated by Dr Sedonia Small.  Elected watchful waiting.

## 2023-09-07 DIAGNOSIS — M25519 Pain in unspecified shoulder: Secondary | ICD-10-CM | POA: Diagnosis not present

## 2023-09-07 DIAGNOSIS — M545 Low back pain, unspecified: Secondary | ICD-10-CM | POA: Diagnosis not present

## 2023-09-07 DIAGNOSIS — M542 Cervicalgia: Secondary | ICD-10-CM | POA: Diagnosis not present

## 2023-09-13 DIAGNOSIS — E7800 Pure hypercholesterolemia, unspecified: Secondary | ICD-10-CM | POA: Diagnosis not present

## 2023-09-13 DIAGNOSIS — I351 Nonrheumatic aortic (valve) insufficiency: Secondary | ICD-10-CM | POA: Diagnosis not present

## 2023-09-13 DIAGNOSIS — I10 Essential (primary) hypertension: Secondary | ICD-10-CM | POA: Diagnosis not present

## 2023-09-13 DIAGNOSIS — Z23 Encounter for immunization: Secondary | ICD-10-CM | POA: Diagnosis not present

## 2023-09-13 DIAGNOSIS — R0609 Other forms of dyspnea: Secondary | ICD-10-CM | POA: Diagnosis not present

## 2023-09-14 ENCOUNTER — Other Ambulatory Visit (INDEPENDENT_AMBULATORY_CARE_PROVIDER_SITE_OTHER): Payer: Medicare HMO

## 2023-09-14 DIAGNOSIS — E7800 Pure hypercholesterolemia, unspecified: Secondary | ICD-10-CM | POA: Diagnosis not present

## 2023-09-14 DIAGNOSIS — M25519 Pain in unspecified shoulder: Secondary | ICD-10-CM | POA: Diagnosis not present

## 2023-09-14 DIAGNOSIS — R739 Hyperglycemia, unspecified: Secondary | ICD-10-CM | POA: Diagnosis not present

## 2023-09-14 DIAGNOSIS — I10 Essential (primary) hypertension: Secondary | ICD-10-CM

## 2023-09-14 DIAGNOSIS — M545 Low back pain, unspecified: Secondary | ICD-10-CM | POA: Diagnosis not present

## 2023-09-14 DIAGNOSIS — M542 Cervicalgia: Secondary | ICD-10-CM | POA: Diagnosis not present

## 2023-09-14 LAB — BASIC METABOLIC PANEL
BUN: 20 mg/dL (ref 6–23)
CO2: 28 mEq/L (ref 19–32)
Calcium: 9.1 mg/dL (ref 8.4–10.5)
Chloride: 102 mEq/L (ref 96–112)
Creatinine, Ser: 0.91 mg/dL (ref 0.40–1.20)
GFR: 59.96 mL/min — ABNORMAL LOW (ref >60.00)
Glucose, Bld: 105 mg/dL — ABNORMAL HIGH (ref 70–99)
Potassium: 3.8 mEq/L (ref 3.5–5.1)
Sodium: 138 mEq/L (ref 135–145)

## 2023-09-14 LAB — CBC WITH DIFFERENTIAL/PLATELET
Basophils Absolute: 0 10*3/uL (ref 0.0–0.1)
Basophils Relative: 0.6 % (ref 0.0–3.0)
Eosinophils Absolute: 0.2 10*3/uL (ref 0.0–0.7)
Eosinophils Relative: 3.4 % (ref 0.0–5.0)
HCT: 40.1 % (ref 36.0–46.0)
Hemoglobin: 13.1 g/dL (ref 12.0–15.0)
Lymphocytes Relative: 23.4 % (ref 12.0–46.0)
Lymphs Abs: 1.4 10*3/uL (ref 0.7–4.0)
MCHC: 32.7 g/dL (ref 30.0–36.0)
MCV: 94.5 fl (ref 78.0–100.0)
Monocytes Absolute: 0.5 10*3/uL (ref 0.1–1.0)
Monocytes Relative: 8.8 % (ref 3.0–12.0)
Neutro Abs: 3.9 10*3/uL (ref 1.4–7.7)
Neutrophils Relative %: 63.8 % (ref 43.0–77.0)
Platelets: 217 10*3/uL (ref 150.0–400.0)
RBC: 4.25 Mil/uL (ref 3.87–5.11)
RDW: 14.8 % (ref 11.5–15.5)
WBC: 6.1 10*3/uL (ref 4.0–10.5)

## 2023-09-14 LAB — HEPATIC FUNCTION PANEL
ALT: 15 U/L (ref 0–35)
AST: 17 U/L (ref 0–37)
Albumin: 4 g/dL (ref 3.5–5.2)
Alkaline Phosphatase: 44 U/L (ref 39–117)
Bilirubin, Direct: 0.1 mg/dL (ref 0.0–0.3)
Total Bilirubin: 0.6 mg/dL (ref 0.2–1.2)
Total Protein: 6.3 g/dL (ref 6.0–8.3)

## 2023-09-14 LAB — LIPID PANEL
Cholesterol: 171 mg/dL (ref 0–200)
HDL: 74.1 mg/dL (ref >39.00)
LDL Cholesterol: 77 mg/dL (ref 0–99)
NonHDL: 96.44
Total CHOL/HDL Ratio: 2
Triglycerides: 95 mg/dL (ref 0.0–149.0)
VLDL: 19 mg/dL (ref 0.0–40.0)

## 2023-09-14 LAB — HEMOGLOBIN A1C: Hgb A1c MFr Bld: 6.2 % (ref 4.6–6.5)

## 2023-09-20 DIAGNOSIS — M5412 Radiculopathy, cervical region: Secondary | ICD-10-CM | POA: Diagnosis not present

## 2023-09-20 DIAGNOSIS — M5416 Radiculopathy, lumbar region: Secondary | ICD-10-CM | POA: Diagnosis not present

## 2023-09-20 DIAGNOSIS — M5030 Other cervical disc degeneration, unspecified cervical region: Secondary | ICD-10-CM | POA: Diagnosis not present

## 2023-09-20 DIAGNOSIS — M48062 Spinal stenosis, lumbar region with neurogenic claudication: Secondary | ICD-10-CM | POA: Diagnosis not present

## 2023-09-20 DIAGNOSIS — M5126 Other intervertebral disc displacement, lumbar region: Secondary | ICD-10-CM | POA: Diagnosis not present

## 2023-09-20 DIAGNOSIS — M4802 Spinal stenosis, cervical region: Secondary | ICD-10-CM | POA: Diagnosis not present

## 2023-09-20 DIAGNOSIS — M419 Scoliosis, unspecified: Secondary | ICD-10-CM | POA: Diagnosis not present

## 2023-09-24 DIAGNOSIS — M25519 Pain in unspecified shoulder: Secondary | ICD-10-CM | POA: Diagnosis not present

## 2023-09-24 DIAGNOSIS — M545 Low back pain, unspecified: Secondary | ICD-10-CM | POA: Diagnosis not present

## 2023-09-24 DIAGNOSIS — M542 Cervicalgia: Secondary | ICD-10-CM | POA: Diagnosis not present

## 2023-09-30 DIAGNOSIS — M5416 Radiculopathy, lumbar region: Secondary | ICD-10-CM | POA: Diagnosis not present

## 2023-09-30 DIAGNOSIS — M48062 Spinal stenosis, lumbar region with neurogenic claudication: Secondary | ICD-10-CM | POA: Diagnosis not present

## 2023-10-03 ENCOUNTER — Ambulatory Visit: Payer: Medicare HMO

## 2023-10-03 DIAGNOSIS — H353131 Nonexudative age-related macular degeneration, bilateral, early dry stage: Secondary | ICD-10-CM | POA: Diagnosis not present

## 2023-10-03 DIAGNOSIS — H43813 Vitreous degeneration, bilateral: Secondary | ICD-10-CM | POA: Diagnosis not present

## 2023-10-03 DIAGNOSIS — H04123 Dry eye syndrome of bilateral lacrimal glands: Secondary | ICD-10-CM | POA: Diagnosis not present

## 2023-10-03 DIAGNOSIS — Z961 Presence of intraocular lens: Secondary | ICD-10-CM | POA: Diagnosis not present

## 2023-10-04 ENCOUNTER — Ambulatory Visit: Payer: Medicare HMO

## 2023-10-04 DIAGNOSIS — Z23 Encounter for immunization: Secondary | ICD-10-CM

## 2023-10-04 NOTE — Progress Notes (Signed)
Patient presented for a high dose flu vaccine and it was administered into her left deltoid. Patient tolerated the vaccine well.

## 2023-10-11 ENCOUNTER — Encounter: Payer: Self-pay | Admitting: Emergency Medicine

## 2023-10-11 DIAGNOSIS — J302 Other seasonal allergic rhinitis: Secondary | ICD-10-CM | POA: Insufficient documentation

## 2023-10-12 ENCOUNTER — Ambulatory Visit (INDEPENDENT_AMBULATORY_CARE_PROVIDER_SITE_OTHER): Payer: Medicare HMO | Admitting: Emergency Medicine

## 2023-10-12 VITALS — Ht 60.0 in | Wt 141.0 lb

## 2023-10-12 DIAGNOSIS — Z780 Asymptomatic menopausal state: Secondary | ICD-10-CM

## 2023-10-12 DIAGNOSIS — Z0000 Encounter for general adult medical examination without abnormal findings: Secondary | ICD-10-CM | POA: Diagnosis not present

## 2023-10-12 NOTE — Progress Notes (Signed)
Subjective:   Carla Ryan is a 77 y.o. female who presents for Medicare Annual (Subsequent) preventive examination.  Visit Complete: Virtual I connected with  Carla Ryan on 10/12/23 by a audio enabled telemedicine application and verified that I am speaking with the correct person using two identifiers.  Patient Location: Home  Provider Location: Home Office  I discussed the limitations of evaluation and management by telemedicine. The patient expressed understanding and agreed to proceed.  Vital Signs: Because this visit was a virtual/telehealth visit, some criteria may be missing or patient reported. Any vitals not documented were not able to be obtained and vitals that have been documented are patient reported.   Cardiac Risk Factors include: advanced age (>53men, >7 women);hypertension;dyslipidemia;Other (see comment), Risk factor comments: OSA uses cpap     Objective:    Today's Vitals   10/12/23 1406 10/12/23 1407  Weight: 141 lb (64 kg)   Height: 5' (1.524 m)   PainSc:  2    Body mass index is 27.54 kg/m.     10/12/2023    2:24 PM 03/02/2023   10:24 AM 02/16/2023    8:32 AM 06/09/2022   12:41 PM 01/13/2021   10:09 AM 08/17/2020   12:34 PM 08/11/2020    7:03 PM  Advanced Directives  Does Patient Have a Medical Advance Directive? Yes Yes Yes Yes Yes No No  Type of Sales promotion account executive of [employer] Power of [employer] Power of Attorney     Does patient want to make changes to medical advance directive? No - Patient declined No - Patient declined  No - Patient declined No - Patient declined    Copy of Healthcare Power of Attorney in Chart? No - copy requested Yes - validated most recent copy scanned in chart (See row information) No - copy requested No - copy requested     Would patient like information on creating a medical advance directive?       No - Patient declined    Current Medications  (verified) Outpatient Encounter Medications as of 10/12/2023  Medication Sig   Ascorbic Acid (VITAMIN C) 1000 MG tablet Take 1,000 mg by mouth daily.   aspirin 81 MG tablet Take 81 mg by mouth daily.   Azelastine HCl 137 MCG/SPRAY SOLN INSTILL TWO SPRAYS INTO EACH NOSTIL TWICE A DAY AS DIRECTED.   Biotin 5 MG CAPS Take by mouth daily.   calcium citrate-vitamin D 500-400 MG-UNIT chewable tablet Chew 1 tablet by mouth 2 (two) times daily.   cholecalciferol (VITAMIN D) 1000 UNITS tablet Take 2,000 Units by mouth in the morning and at bedtime.    fluticasone (FLONASE) 50 MCG/ACT nasal spray Place 2 sprays into both nostrils daily.   gabapentin (NEURONTIN) 100 MG capsule Take 2 capsules (200 mg total) by mouth at bedtime.   irbesartan (AVAPRO) 75 MG tablet Take 1 tablet (75 mg total) by mouth daily.   levocetirizine (XYZAL) 5 MG tablet TAKE 1 TABLET EVERY EVENING   lidocaine (LIDODERM) 5 % Place 1 patch onto the skin as needed. Remove & Discard patch within 12 hours or as directed by MD   Magnesium Glycinate 100 MG CAPS Take 200 mg by mouth daily.   methocarbamol (ROBAXIN) 500 MG tablet Take 1 tablet (500 mg total) by mouth at bedtime as needed for muscle spasms. (Patient taking differently: Take 500 mg by mouth at bedtime as needed for muscle spasms. Pt taking prn)   Misc  Natural Products (TART CHERRY ADVANCED) CAPS Take 1 capsule by mouth 2 (two) times daily. Reported on 03/04/2016   MULTIPLE MINERALS-VITAMINS PO    PEPPERMINT OIL PO Take by mouth.   RABEprazole (ACIPHEX) 20 MG tablet Take 1 tablet (20 mg total) by mouth 2 (two) times daily.   rosuvastatin (CRESTOR) 5 MG tablet Take 1 tablet (5 mg total) by mouth daily.   sucralfate (CARAFATE) 1 g tablet Take 1 tablet (1 g total) by mouth 2 (two) times daily.   triamterene-hydrochlorothiazide (MAXZIDE-25) 37.5-25 MG tablet Take 0.5 tablets by mouth daily.   RESTASIS 0.05 % ophthalmic emulsion Place 1 drop into both eyes 2 (two) times daily.  (Patient not taking: Reported on 10/12/2023)   [DISCONTINUED] predniSONE (DELTASONE) 10 MG tablet 6 day taper - Take as directed   No facility-administered encounter medications on file as of 10/12/2023.    Allergies (verified) Patient has no known allergies.   History: Past Medical History:  Diagnosis Date   Abdominal hernia    Anxiety    situational stress and anxiety   Cancer (HCC) 2015   breast    Cataract cortical, senile, unspecified laterality    Chest pain    Exertional dyspnea    GERD (gastroesophageal reflux disease)    Hiatal hernia    Hiatal hernia    Hypercholesterolemia    Hyperlipidemia    Hypertension    Osteoarthritis    scoliosis, degenerative disc dz, knee, carpal tunnel   PONV (postoperative nausea and vomiting)    Sleep apnea    Past Surgical History:  Procedure Laterality Date   ABDOMINAL HYSTERECTOMY  07/2000   APPENDECTOMY  2000   APPENDECTOMY     BREAST BIOPSY Bilateral 2014   BREAST SURGERY Right 2015   lumpectomy   CARPAL TUNNEL RELEASE     CATARACT EXTRACTION W/PHACO Right 02/16/2023   Procedure: CATARACT EXTRACTION PHACO AND INTRAOCULAR LENS PLACEMENT (IOC) RIGHT  7.20  00:39.2;  Surgeon: Lockie Mola, MD;  Location: [facility];  Service: Ophthalmology;  Laterality: Right;   CATARACT EXTRACTION W/PHACO Left 03/02/2023   Procedure: CATARACT EXTRACTION PHACO AND INTRAOCULAR LENS PLACEMENT (IOC) LEFT  6.41  00:42.0;  Surgeon: Lockie Mola, MD;  Location: [facility];  Service: Ophthalmology;  Laterality: Left;   COLONOSCOPY  2011   Barnetta Chapel, [name]: Normal exam   COLONOSCOPY WITH PROPOFOL N/A 04/03/2020   Procedure: COLONOSCOPY WITH PROPOFOL;  Surgeon: Toledo, Boykin Nearing, MD;  Location: [facility];  Service: Gastroenterology;  Laterality: N/A;   ESOPHAGOGASTRODUODENOSCOPY (EGD) WITH PROPOFOL N/A 04/03/2020   Procedure: ESOPHAGOGASTRODUODENOSCOPY (EGD) WITH PROPOFOL;  Surgeon: Toledo, Boykin Nearing, MD;   Location: [facility];  Service: Gastroenterology;  Laterality: N/A;   HERNIA REPAIR  02/10/15   Laparoscopic Repair of Lateral Abdominal Wall Hernia  02/10/15   MASTECTOMY     UPPER GI ENDOSCOPY  05/10/2014   Barnetta Chapel, [name] Bile gastritis, medium hiatal hernia. Incomplete visualization of the cardia and fundus.   Family History  Problem Relation Age of Onset   Diabetes Mother    Multiple myeloma Mother    Multiple myeloma Father    Diabetes Sister        x2   Diabetes Brother    Hypertension Brother    Dementia Brother    Social History   Socioeconomic History   Marital status: Married    Spouse name: Angelene Giovanni   Number of children: 2   Years of education: Not on file   Highest education  level: 12th grade  Occupational History   Occupation: retired  Tobacco Use   Smoking status: Never   Smokeless tobacco: Never  Vaping Use   Vaping status: Never Used  Substance and Sexual Activity   Alcohol use: Not Currently    Comment: wine occasionally   Drug use: No   Sexual activity: Yes  Other Topics Concern   Not on file  Social History Narrative   Not on file   Social Determinants of Health   Financial Resource Strain: Low Risk  (10/12/2023)   Overall Financial Resource Strain (CARDIA)    Difficulty of Paying Living Expenses: Not hard at all  Food Insecurity: No Food Insecurity (10/12/2023)   Hunger Vital Sign    Worried About Running Out of Food in the Last Year: Never true    Ran Out of Food in the Last Year: Never true  Transportation Needs: No Transportation Needs (10/12/2023)   PRAPARE - Administrator, Civil Service (Medical): No    Lack of Transportation (Non-Medical): No  Physical Activity: Insufficiently Active (10/12/2023)   Exercise Vital Sign    Days of Exercise per Week: 3 days    Minutes of Exercise per Session: 10 min  Stress: No Stress Concern Present (10/12/2023)   [employer] of Occupational Health - Occupational Stress  Questionnaire    Feeling of Stress : Only a little  Social Connections: Moderately Integrated (10/12/2023)   Social Connection and Isolation Panel [NHANES]    Frequency of Communication with Friends and Family: More than three times a week    Frequency of Social Gatherings with Friends and Family: Once a week    Attends Religious Services: More than 4 times per year    Active Member of [employer] or Organizations: No    Attends Engineer, structural: Never    Marital Status: Married    Tobacco Counseling Counseling given: Not Answered   Clinical Intake:  Pre-visit preparation completed: Yes  Pain : 0-10 Pain Score: 2  Pain Type: Chronic pain Pain Location: Back Pain Descriptors / Indicators: Aching     BMI - recorded: 27.54 Nutritional Status: BMI 25 -29 Overweight Nutritional Risks: None Diabetes: No  How often do you need to have someone help you when you read instructions, pamphlets, or other written materials from your doctor or pharmacy?: 1 - Never  Interpreter Needed?: No  Information entered by :: Tora Kindred, CMA   Activities of Daily Living    10/12/2023    2:09 PM 03/02/2023   10:18 AM  In your present state of health, do you have any difficulty performing the following activities:  Hearing? 0 0  Vision? 1 0  Comment dry eyes   Difficulty concentrating or making decisions? 0 0  Walking or climbing stairs? 1 0  Comment chronic back pain   Dressing or bathing? 0 0  Doing errands, shopping? 0   Preparing Food and eating ? N   Using the Toilet? N   In the past six months, have you accidently leaked urine? N   Do you have problems with loss of bowel control? N   Managing your Medications? N   Managing your Finances? N   Housekeeping or managing your Housekeeping? N     Patient Care Team: Dale Hunters[place], MD as PCP - General (Internal Medicine) Dale Ingham, MD (Internal Medicine) Lemar Livings Merrily Pew, MD (General Surgery)  Indicate any  recent Medical Services you may have received from other than [facility] providers  in the past year (date may be approximate).     Assessment:   This is a routine wellness examination for Madyn.  Hearing/Vision screen Hearing Screening - Comments:: Denies hearing loss Vision Screening - Comments:: Gets eye exams   Goals Addressed               This Visit's Progress     Patient Stated (pt-stated)        Continue to do stretching exercises that PT gave me      Depression Screen    10/12/2023    2:22 PM 08/31/2023   11:31 AM 01/27/2023   11:16 AM 10/04/2022    8:17 AM 08/27/2022   10:05 AM 06/09/2022   12:44 PM 04/09/2022    8:56 AM  PHQ 2/9 Scores  PHQ - 2 Score 0 0 0 1 0 0 0  PHQ- 9 Score 0 2         Fall Risk    10/12/2023    2:25 PM 08/31/2023   11:31 AM 01/27/2023   11:15 AM 10/04/2022    8:17 AM 08/27/2022   10:05 AM  Fall Risk   Falls in the past year? 0 0 0 0 0  Number falls in past yr: 0 0 0 0 0  Injury with Fall? 0 0 0 0   Risk for fall due to : No Fall Risks No Fall Risks No Fall Risks No Fall Risks No Fall Risks  Follow up Falls prevention discussed  Falls evaluation completed Falls evaluation completed Falls evaluation completed    MEDICARE RISK AT HOME: Medicare Risk at Home Any stairs in or around the home?: Yes If so, are there any without handrails?: No Home free of loose throw rugs in walkways, pet beds, electrical cords, etc?: Yes Adequate lighting in your home to reduce risk of falls?: Yes Life alert?: No Use of a cane, walker or w/c?: No Grab bars in the bathroom?: No Shower chair or bench in shower?: No Elevated toilet seat or a handicapped toilet?: Yes  TIMED UP AND GO:  Was the test performed?  No    Cognitive Function:    05/03/2018    9:40 AM 05/02/2017    9:35 AM 11/07/2015    9:33 AM  MMSE - Mini Mental State Exam  Orientation to time 5 5 5   Orientation to Place 5 5 5   Registration 3 3 3   Attention/ Calculation 5 5 5   Recall 3 3 3    Language- name 2 objects 2 2 2   Language- repeat 1 1 1   Language- follow 3 step command 3 3 3   Language- read & follow direction 1 1 1   Write a sentence 1 1 1   Copy design 1 1 1   Total score 30 30 30         10/12/2023    2:27 PM  6CIT Screen  What Year? 0 points  What month? 0 points  What time? 0 points  Count back from 20 0 points  Months in reverse 0 points  Repeat phrase 0 points  Total Score 0 points    Immunizations Immunization History  Administered Date(s) Administered   Fluad Quad(high Dose 65+) 10/17/2019, 09/22/2020, 10/12/2021, 10/26/2022   Fluad Trivalent(High Dose 65+) 10/04/2023   Influenza Split 10/25/2012   Influenza, High Dose Seasonal PF 11/07/2015, 09/03/2016, 10/05/2017, 10/12/2018   Influenza,inj,Quad PF,6+ Mos 09/28/2013, 10/08/2014   PFIZER(Purple Top)SARS-COV-2 Vaccination 01/07/2020, 01/28/2020   Pneumococcal Conjugate-13 11/27/2013   Pneumococcal Polysaccharide-23 11/05/2015  Td 12/04/2015   Tdap 12/20/2004   Zoster Recombinant(Shingrix) 10/02/2019, 03/25/2020   Zoster, Live 12/20/2008    TDAP status: Up to date  Flu Vaccine status: Up to date  Pneumococcal vaccine status: Up to date  Covid-19 vaccine status: Declined, Education has been provided regarding the importance of this vaccine but patient still declined. Advised may receive this vaccine at local pharmacy or Health Dept.or vaccine clinic. Aware to provide a copy of the vaccination record if obtained from local pharmacy or Health Dept. Verbalized acceptance and understanding.  Qualifies for Shingles Vaccine? Yes   Zostavax completed No   Shingrix Completed?: Yes  Screening Tests Health Maintenance  Topic Date Due   COVID-19 Vaccine (3 - Pfizer risk series) 02/25/2020   MAMMOGRAM  02/24/2024   Medicare Annual Wellness (AWV)  10/11/2024   DTaP/Tdap/Td (3 - Td or Tdap) 12/03/2025   Pneumonia Vaccine 48+ Years old  Completed   INFLUENZA VACCINE  Completed   DEXA SCAN   Completed   Hepatitis C Screening  Completed   Zoster Vaccines- Shingrix  Completed   HPV VACCINES  Aged Out   Colonoscopy  Discontinued    Health Maintenance  Health Maintenance Due  Topic Date Due   COVID-19 Vaccine (3 - Pfizer risk series) 02/25/2020    Colorectal cancer screening: No longer required.   Mammogram status: Completed 02/24/23. Repeat every year  Bone Density status: Completed 01/05/22. Results reflect: Bone density results: OSTEOPENIA. Repeat every 2 years.  Lung Cancer Screening: (Low Dose CT Chest recommended if Age 46-80 years, 20 pack-year currently smoking OR have quit w/in 15years.) does not qualify.   Lung Cancer Screening Referral: n/a  Additional Screening:  Hepatitis C Screening: does not qualify; Completed 04/16/16  Vision Screening: Recommended annual ophthalmology exams for early detection of glaucoma and other disorders of the eye.  Dental Screening: Recommended annual dental exams for proper oral hygiene   Community Resource Referral / Chronic Care Management: CRR required this visit?  No   CCM required this visit?  No     Plan:     I have personally reviewed and noted the following in the patient's chart:   Medical and social history Use of alcohol, tobacco or illicit drugs  Current medications and supplements including opioid prescriptions. Patient is not currently taking opioid prescriptions. Functional ability and status Nutritional status Physical activity Advanced directives List of other physicians Hospitalizations, surgeries, and ER visits in previous 12 months Vitals Screenings to include cognitive, depression, and falls Referrals and appointments  In addition, I have reviewed and discussed with patient certain preventive protocols, quality metrics, and best practice recommendations. A written personalized care plan for preventive services as well as general preventive health recommendations were provided to patient.      Tora Kindred, CMA   10/12/2023   After Visit Summary: (MyChart) Due to this being a telephonic visit, the after visit summary with patients personalized plan was offered to patient via MyChart   Nurse Notes:  Placed order for DEXA scan due ~ 01/06/24 Declined covid and shingles vaccines.

## 2023-10-12 NOTE — Patient Instructions (Addendum)
Carla Ryan , Thank you for taking time to come for your Medicare Wellness Visit. I appreciate your ongoing commitment to your health goals. Please review the following plan we discussed and let me know if I can assist you in the future.   Referrals/Orders/Follow-Ups/Clinician Recommendations: I have placed an order for a bone density test to be done after 01/06/24. Call [facility] @ [phone] to schedule at your earliest convenience.  This is a list of the screening recommended for you and due dates:  Health Maintenance  Topic Date Due   COVID-19 Vaccine (3 - Pfizer risk series) 02/25/2020   DEXA scan (bone density measurement)  01/06/2024   Mammogram  02/24/2024   Medicare Annual Wellness Visit  10/11/2024   DTaP/Tdap/Td vaccine (3 - Td or Tdap) 12/03/2025   Pneumonia Vaccine  Completed   Flu Shot  Completed   Hepatitis C Screening  Completed   Zoster (Shingles) Vaccine  Completed   HPV Vaccine  Aged Out   Colon Cancer Screening  Discontinued    Advanced directives: (Copy Requested) Please bring a copy of your health care power of attorney and living will to the office to be added to your chart at your convenience.  Next Medicare Annual Wellness Visit scheduled for next year: Yes, 10/15/24 @ 3:45pm

## 2023-11-02 ENCOUNTER — Encounter: Payer: Self-pay | Admitting: Nurse Practitioner

## 2023-11-02 ENCOUNTER — Telehealth: Payer: Medicare HMO | Admitting: Nurse Practitioner

## 2023-11-02 ENCOUNTER — Encounter: Payer: Self-pay | Admitting: Internal Medicine

## 2023-11-02 VITALS — Ht 60.0 in

## 2023-11-02 DIAGNOSIS — R051 Acute cough: Secondary | ICD-10-CM | POA: Diagnosis not present

## 2023-11-02 MED ORDER — AZITHROMYCIN 250 MG PO TABS
ORAL_TABLET | ORAL | 0 refills | Status: AC
Start: 2023-11-02 — End: 2023-11-07

## 2023-11-02 MED ORDER — BENZONATATE 200 MG PO CAPS: 200.0000 mg | ORAL_CAPSULE | Freq: Three times a day (TID) | ORAL | 0 refills | Status: DC | PRN

## 2023-11-02 NOTE — Progress Notes (Signed)
MyChart Video Visit    Virtual Visit via Video Note   This visit type was conducted because this format is felt to be most appropriate for this patient at this time. Physical exam was limited by quality of the video and audio technology used for the visit. CMA was able to get the patient set up on a video visit.  Patient location: Home. Patient and provider in visit Provider location: Office  I discussed the limitations of evaluation and management by telemedicine and the availability of in person appointments. The patient expressed understanding and agreed to proceed.  Visit Date: 11/02/2023  Today's healthcare provider: Bethanie Dicker, NP     Subjective:    Patient ID: Carla Ryan, female    DOB: [birth year], 77 y.o.   MRN: [id number]  Chief Complaint  Patient presents with   Covid Positive    Tested positive on 31st  had fevers husband tested positive on Monday 10-17-23. Has a cough now feels congested, sometimes she coughs up something sometimes she does not.     HPI  Discussed the use of AI scribe software for clinical note transcription with the patient, who gave verbal consent to proceed.  History of Present Illness   The patient, with a recent history of COVID-19, presents with a persistent cough that has worsened over the past two days, particularly at night. The cough, initially improving, has never completely resolved over the past two weeks. The patient reports increased coughing and shortness of breath after exertion, such as vacuuming. She describes a sensation of chest congestion, which is a new symptom for her. She has been producing yellowish sputum and has had significant nasal congestion with bloody nasal discharge.  The patient denies any current fever, but reports having a fever around the time of her COVID-19 diagnosis on October 31st. She also reports significant fatigue, which she attributes to her recent illness and the stress of her husband's  hospitalization for COVID-19.  The patient has been taking Mucinex, which she feels has been helpful in managing her symptoms. She has previously tried [employer] for a severe cough last year, but found it only provided temporary relief. She expresses concern about potential drowsiness with certain cough medications.  The patient tested positive for COVID-19 on Halloween and has been managing her symptoms at home. She has not had any imaging studies to evaluate her chest symptoms. She expresses concern about the possibility of developing pneumonia.      Past Medical History:  Diagnosis Date   Abdominal hernia    Anxiety    situational stress and anxiety   Cancer (HCC) 2015   breast    Cataract cortical, senile, unspecified laterality    Chest pain    Exertional dyspnea    GERD (gastroesophageal reflux disease)    Hiatal hernia    Hiatal hernia    Hypercholesterolemia    Hyperlipidemia    Hypertension    Osteoarthritis    scoliosis, degenerative disc dz, knee, carpal tunnel   PONV (postoperative nausea and vomiting)    Sleep apnea     Past Surgical History:  Procedure Laterality Date   ABDOMINAL HYSTERECTOMY  07/2000   APPENDECTOMY  2000   APPENDECTOMY     BREAST BIOPSY Bilateral 2014   BREAST SURGERY Right 2015   lumpectomy   CARPAL TUNNEL RELEASE     CATARACT EXTRACTION W/PHACO Right 02/16/2023   Procedure: CATARACT EXTRACTION PHACO AND INTRAOCULAR LENS PLACEMENT (IOC) RIGHT  7.20  00:39.2;  Surgeon: Lockie Mola, MD;  Location: [facility];  Service: Ophthalmology;  Laterality: Right;   CATARACT EXTRACTION W/PHACO Left 03/02/2023   Procedure: CATARACT EXTRACTION PHACO AND INTRAOCULAR LENS PLACEMENT (IOC) LEFT  6.41  00:42.0;  Surgeon: Lockie Mola, MD;  Location: [facility];  Service: Ophthalmology;  Laterality: Left;   COLONOSCOPY  2011   Barnetta Chapel, [name]: Normal exam   COLONOSCOPY WITH PROPOFOL N/A 04/03/2020   Procedure:  COLONOSCOPY WITH PROPOFOL;  Surgeon: Toledo, Boykin Nearing, MD;  Location: [facility];  Service: Gastroenterology;  Laterality: N/A;   ESOPHAGOGASTRODUODENOSCOPY (EGD) WITH PROPOFOL N/A 04/03/2020   Procedure: ESOPHAGOGASTRODUODENOSCOPY (EGD) WITH PROPOFOL;  Surgeon: Toledo, Boykin Nearing, MD;  Location: [facility];  Service: Gastroenterology;  Laterality: N/A;   HERNIA REPAIR  02/10/15   Laparoscopic Repair of Lateral Abdominal Wall Hernia  02/10/15   MASTECTOMY     UPPER GI ENDOSCOPY  05/10/2014   Barnetta Chapel, [name] Bile gastritis, medium hiatal hernia. Incomplete visualization of the cardia and fundus.    Family History  Problem Relation Age of Onset   Diabetes Mother    Multiple myeloma Mother    Multiple myeloma Father    Diabetes Sister        x2   Diabetes Brother    Hypertension Brother    Dementia Brother     Social History   Socioeconomic History   Marital status: Married    Spouse name: Angelene Giovanni   Number of children: 2   Years of education: Not on file   Highest education level: 12th grade  Occupational History   Occupation: retired  Tobacco Use   Smoking status: Never   Smokeless tobacco: Never  Vaping Use   Vaping status: Never Used  Substance and Sexual Activity   Alcohol use: Not Currently    Comment: wine occasionally   Drug use: No   Sexual activity: Yes  Other Topics Concern   Not on file  Social History Narrative   Not on file   Social Determinants of Health   Financial Resource Strain: Low Risk  (10/12/2023)   Overall Financial Resource Strain (CARDIA)    Difficulty of Paying Living Expenses: Not hard at all  Food Insecurity: No Food Insecurity (10/12/2023)   Hunger Vital Sign    Worried About Running Out of Food in the Last Year: Never true    Ran Out of Food in the Last Year: Never true  Transportation Needs: No Transportation Needs (10/12/2023)   PRAPARE - Administrator, Civil Service (Medical): No    Lack of Transportation  (Non-Medical): No  Physical Activity: Insufficiently Active (10/12/2023)   Exercise Vital Sign    Days of Exercise per Week: 3 days    Minutes of Exercise per Session: 10 min  Stress: No Stress Concern Present (10/12/2023)   [employer] of Occupational Health - Occupational Stress Questionnaire    Feeling of Stress : Only a little  Social Connections: Moderately Integrated (10/12/2023)   Social Connection and Isolation Panel [NHANES]    Frequency of Communication with Friends and Family: More than three times a week    Frequency of Social Gatherings with Friends and Family: Once a week    Attends Religious Services: More than 4 times per year    Active Member of [employer] or Organizations: No    Attends Banker Meetings: Never    Marital Status: Married  Catering manager Violence: Not At Risk (10/12/2023)   Humiliation, Afraid,  Rape, and Kick questionnaire    Fear of Current or Ex-Partner: No    Emotionally Abused: No    Physically Abused: No    Sexually Abused: No    Outpatient Medications Prior to Visit  Medication Sig Dispense Refill   Ascorbic Acid (VITAMIN C) 1000 MG tablet Take 1,000 mg by mouth daily.     aspirin 81 MG tablet Take 81 mg by mouth daily.     Azelastine HCl 137 MCG/SPRAY SOLN INSTILL TWO SPRAYS INTO EACH NOSTIL TWICE A DAY AS DIRECTED. 120 mL 3   Biotin 5 MG CAPS Take by mouth daily.     calcium citrate-vitamin D 500-400 MG-UNIT chewable tablet Chew 1 tablet by mouth 2 (two) times daily.     cholecalciferol (VITAMIN D) 1000 UNITS tablet Take 2,000 Units by mouth in the morning and at bedtime.      fluticasone (FLONASE) 50 MCG/ACT nasal spray Place 2 sprays into both nostrils daily. 48 g 1   gabapentin (NEURONTIN) 100 MG capsule Take 2 capsules (200 mg total) by mouth at bedtime. 180 capsule 1   irbesartan (AVAPRO) 75 MG tablet Take 1 tablet (75 mg total) by mouth daily. 90 tablet 3   levocetirizine (XYZAL) 5 MG tablet TAKE 1 TABLET EVERY EVENING  90 tablet 3   lidocaine (LIDODERM) 5 % Place 1 patch onto the skin as needed. Remove & Discard patch within 12 hours or as directed by MD     Magnesium Glycinate 100 MG CAPS Take 200 mg by mouth daily.     methocarbamol (ROBAXIN) 500 MG tablet Take 1 tablet (500 mg total) by mouth at bedtime as needed for muscle spasms. (Patient taking differently: Take 500 mg by mouth at bedtime as needed for muscle spasms. Pt taking prn) 30 tablet 0   Misc Natural Products (TART CHERRY ADVANCED) CAPS Take 1 capsule by mouth 2 (two) times daily. Reported on 03/04/2016     MULTIPLE MINERALS-VITAMINS PO      PEPPERMINT OIL PO Take by mouth.     RABEprazole (ACIPHEX) 20 MG tablet Take 1 tablet (20 mg total) by mouth 2 (two) times daily. 180 tablet 1   RESTASIS 0.05 % ophthalmic emulsion Place 1 drop into both eyes 2 (two) times daily. (Patient not taking: Reported on 10/12/2023)     rosuvastatin (CRESTOR) 5 MG tablet Take 1 tablet (5 mg total) by mouth daily. 90 tablet 3   sucralfate (CARAFATE) 1 g tablet Take 1 tablet (1 g total) by mouth 2 (two) times daily. 180 tablet 1   triamterene-hydrochlorothiazide (MAXZIDE-25) 37.5-25 MG tablet Take 0.5 tablets by mouth daily. 45 tablet 1   No facility-administered medications prior to visit.    No Known Allergies  ROS See HPI    Objective:    Physical Exam  Ht 5' (1.524 m)   BMI 27.54 kg/m  Wt Readings from Last 3 Encounters:  10/12/23 141 lb (64 kg)  08/31/23 144 lb 6 oz (65.5 kg)  04/28/23 143 lb 9.6 oz (65.1 kg)   GENERAL: alert, oriented, appears well and in no acute distress   HEENT: atraumatic, conjunttiva clear, no obvious abnormalities on inspection of external nose and ears   NECK: normal movements of the head and neck   LUNGS: on inspection no signs of respiratory distress, breathing rate appears normal, no obvious gross SOB, gasping or wheezing   CV: no obvious cyanosis   MS: moves all visible extremities without noticeable abnormality    PSYCH/NEURO: pleasant  and cooperative, no obvious depression or anxiety, speech and thought processing grossly intact     Assessment & Plan:   Problem List Items Addressed This Visit       Other   Acute cough - Primary    She exhibits a persistent cough for two weeks, which has worsened in the last two days, particularly at night, and is productive of yellowish sputum. She also reports chest congestion and exertional dyspnea but no fever or sore throat, alongside fatigue. She is experiencing persistent post-nasal drip causing gagging and bloody nasal discharge. We will continue Mucinex for symptomatic relief and encourage increased fluid intake to thin mucus. We will start Azithromycin (Z-Pak) to cover potential bacterial superinfection and increase the dose of Tessalon Perles (benzonatate) to 200mg  for cough control. She has an albuterol inhaler at home to use if needed. A check-in is scheduled for 11/04/2023 to assess her response to treatment. Should symptoms worsen, including increased shortness of breath, weakness, hemoptysis, or chest pain, she is advised to go to the hospital.       Relevant Medications   azithromycin (ZITHROMAX) 250 MG tablet   benzonatate (TESSALON) 200 MG capsule    I am having Karoline D. Sochacki start on azithromycin and benzonatate. I am also having her maintain her aspirin, cholecalciferol, Tart [place] Advanced, RABEprazole, sucralfate, PEPPERMINT OIL PO, methocarbamol, MULTIPLE MINERALS-VITAMINS PO, calcium citrate-vitamin D, levocetirizine, vitamin C, Magnesium Glycinate, Biotin, lidocaine, gabapentin, irbesartan, rosuvastatin, fluticasone, Azelastine HCl, triamterene-hydrochlorothiazide, and Restasis.  Meds ordered this encounter  Medications   azithromycin (ZITHROMAX) 250 MG tablet    Sig: Take 2 tablets on day 1, then 1 tablet daily on days 2 through 5    Dispense:  6 tablet    Refill:  0    Order Specific Question:   Supervising Provider    Answer:    Birdie Sons, ERIC G [[id number]]   benzonatate (TESSALON) 200 MG capsule    Sig: Take 1 capsule (200 mg total) by mouth 3 (three) times daily as needed for cough.    Dispense:  30 capsule    Refill:  0    Order Specific Question:   Supervising Provider    Answer:   Birdie Sons, ERIC G [[id number]]   I discussed the assessment and treatment plan with the patient. The patient was provided an opportunity to ask questions and all were answered. The patient agreed with the plan and demonstrated an understanding of the instructions.   The patient was advised to call back or seek an in-person evaluation if the symptoms worsen or if the condition fails to improve as anticipated.   Bethanie Dicker, NP [facility] at [facility] [phone] (phone) [phone] (fax)  [facility]

## 2023-11-02 NOTE — Telephone Encounter (Signed)
Patient scheduled with Arville Lime at [facility]

## 2023-11-02 NOTE — Assessment & Plan Note (Signed)
She exhibits a persistent cough for two weeks, which has worsened in the last two days, particularly at night, and is productive of yellowish sputum. She also reports chest congestion and exertional dyspnea but no fever or sore throat, alongside fatigue. She is experiencing persistent post-nasal drip causing gagging and bloody nasal discharge. We will continue Mucinex for symptomatic relief and encourage increased fluid intake to thin mucus. We will start Azithromycin (Z-Pak) to cover potential bacterial superinfection and increase the dose of Tessalon Perles (benzonatate) to 200mg  for cough control. She has an albuterol inhaler at home to use if needed. A check-in is scheduled for 11/04/2023 to assess her response to treatment. Should symptoms worsen, including increased shortness of breath, weakness, hemoptysis, or chest pain, she is advised to go to the hospital.

## 2023-11-07 ENCOUNTER — Telehealth: Payer: Self-pay

## 2023-11-07 NOTE — Telephone Encounter (Signed)
Detailed VM left informing pt to call and book an appt if she is continuing not to feel well per Malva Cogan advice

## 2023-11-07 NOTE — Telephone Encounter (Signed)
Patient called back. She said she finished the antibiotics last night. She said she still coughing. She said she just feel tired and not feeling great. Her number is [phone]

## 2023-11-07 NOTE — Telephone Encounter (Signed)
LVM asking pt to CB as Bethanie Dicker, NP would like to know how she is feeling since starting the antibiotics :    Bethanie Dicker, NP  Bethanie Dicker, NP; Donavan Foil, CMA Please check on how patient is feeling after starting on antibiotics.

## 2023-11-08 ENCOUNTER — Encounter: Payer: Self-pay | Admitting: Internal Medicine

## 2023-11-08 ENCOUNTER — Ambulatory Visit (INDEPENDENT_AMBULATORY_CARE_PROVIDER_SITE_OTHER): Payer: Medicare HMO

## 2023-11-08 ENCOUNTER — Ambulatory Visit (INDEPENDENT_AMBULATORY_CARE_PROVIDER_SITE_OTHER): Payer: Medicare HMO | Admitting: Internal Medicine

## 2023-11-08 VITALS — BP 136/68 | HR 67 | Temp 98.8°F | Ht 60.0 in | Wt 141.8 lb

## 2023-11-08 DIAGNOSIS — R053 Chronic cough: Secondary | ICD-10-CM

## 2023-11-08 DIAGNOSIS — K449 Diaphragmatic hernia without obstruction or gangrene: Secondary | ICD-10-CM | POA: Diagnosis not present

## 2023-11-08 DIAGNOSIS — J069 Acute upper respiratory infection, unspecified: Secondary | ICD-10-CM

## 2023-11-08 DIAGNOSIS — R0989 Other specified symptoms and signs involving the circulatory and respiratory systems: Secondary | ICD-10-CM | POA: Diagnosis not present

## 2023-11-08 DIAGNOSIS — I10 Essential (primary) hypertension: Secondary | ICD-10-CM

## 2023-11-08 MED ORDER — DOXYCYCLINE HYCLATE 100 MG PO TABS: 100.0000 mg | ORAL_TABLET | Freq: Two times a day (BID) | ORAL | 0 refills | Status: DC

## 2023-11-08 MED ORDER — PREDNISONE 10 MG PO TABS: ORAL_TABLET | ORAL | 0 refills | Status: DC

## 2023-11-08 NOTE — Progress Notes (Signed)
Subjective:    Patient ID: Carla Ryan, female    DOB: 08/07/1946, 77 y.o.   MRN: [id number]  Patient here for  Chief Complaint  Patient presents with   Cough    HPI Work in appt - work in with persistent cough and congestion. Tested positive for covid 10/20/23. Evaluated 11/02/23 - persistent cough. Was encouraged to continue mucinex.  Treated with zpak and tessalon perles - increased to 200mg  dose. Came in with her husband today. Reported persistent symptoms. Worked in for evaluation.  Feeling better.  Still with cough- colored mucus.  Increased head fullness.  Bloody nasal mucus. Increased post nasal drainage. Some coughing fits. Increased chest congestion. Some diarrhea three days ago.  Took imodium.  Bowels better now. No sob.    Past Medical History:  Diagnosis Date   Abdominal hernia    Anxiety    situational stress and anxiety   Cancer (HCC) 2015   breast    Cataract cortical, senile, unspecified laterality    Chest pain    Exertional dyspnea    GERD (gastroesophageal reflux disease)    Hiatal hernia    Hiatal hernia    Hypercholesterolemia    Hyperlipidemia    Hypertension    Osteoarthritis    scoliosis, degenerative disc dz, knee, carpal tunnel   PONV (postoperative nausea and vomiting)    Sleep apnea    Past Surgical History:  Procedure Laterality Date   ABDOMINAL HYSTERECTOMY  07/2000   APPENDECTOMY  2000   APPENDECTOMY     BREAST BIOPSY Bilateral 2014   BREAST SURGERY Right 2015   lumpectomy   CARPAL TUNNEL RELEASE     CATARACT EXTRACTION W/PHACO Right 02/16/2023   Procedure: CATARACT EXTRACTION PHACO AND INTRAOCULAR LENS PLACEMENT (IOC) RIGHT  7.20  00:39.2;  Surgeon: Lockie Mola, MD;  Location: [facility];  Service: Ophthalmology;  Laterality: Right;   CATARACT EXTRACTION W/PHACO Left 03/02/2023   Procedure: CATARACT EXTRACTION PHACO AND INTRAOCULAR LENS PLACEMENT (IOC) LEFT  6.41  00:42.0;  Surgeon: Lockie Mola, MD;   Location: [facility];  Service: Ophthalmology;  Laterality: Left;   COLONOSCOPY  2011   Barnetta Chapel, [name]: Normal exam   COLONOSCOPY WITH PROPOFOL N/A 04/03/2020   Procedure: COLONOSCOPY WITH PROPOFOL;  Surgeon: Toledo, Boykin Nearing, MD;  Location: [facility];  Service: Gastroenterology;  Laterality: N/A;   ESOPHAGOGASTRODUODENOSCOPY (EGD) WITH PROPOFOL N/A 04/03/2020   Procedure: ESOPHAGOGASTRODUODENOSCOPY (EGD) WITH PROPOFOL;  Surgeon: Toledo, Boykin Nearing, MD;  Location: [facility];  Service: Gastroenterology;  Laterality: N/A;   HERNIA REPAIR  02/10/15   Laparoscopic Repair of Lateral Abdominal Wall Hernia  02/10/15   MASTECTOMY     UPPER GI ENDOSCOPY  05/10/2014   Barnetta Chapel, [name] Bile gastritis, medium hiatal hernia. Incomplete visualization of the cardia and fundus.   Family History  Problem Relation Age of Onset   Diabetes Mother    Multiple myeloma Mother    Multiple myeloma Father    Diabetes Sister        x2   Diabetes Brother    Hypertension Brother    Dementia Brother    Social History   Socioeconomic History   Marital status: Married    Spouse name: Angelene Giovanni   Number of children: 2   Years of education: Not on file   Highest education level: 12th grade  Occupational History   Occupation: retired  Tobacco Use   Smoking status: Never   Smokeless tobacco: Never  Advertising account planner  Vaping status: Never Used  Substance and Sexual Activity   Alcohol use: Not Currently    Comment: wine occasionally   Drug use: No   Sexual activity: Yes  Other Topics Concern   Not on file  Social History Narrative   Not on file   Social Determinants of Health   Financial Resource Strain: Low Risk  (10/12/2023)   Overall Financial Resource Strain (CARDIA)    Difficulty of Paying Living Expenses: Not hard at all  Food Insecurity: No Food Insecurity (10/12/2023)   Hunger Vital Sign    Worried About Running Out of Food in the Last Year: Never true    Ran Out of Food  in the Last Year: Never true  Transportation Needs: No Transportation Needs (10/12/2023)   PRAPARE - Administrator, Civil Service (Medical): No    Lack of Transportation (Non-Medical): No  Physical Activity: Insufficiently Active (10/12/2023)   Exercise Vital Sign    Days of Exercise per Week: 3 days    Minutes of Exercise per Session: 10 min  Stress: No Stress Concern Present (10/12/2023)   [employer] of Occupational Health - Occupational Stress Questionnaire    Feeling of Stress : Only a little  Social Connections: Moderately Integrated (10/12/2023)   Social Connection and Isolation Panel [NHANES]    Frequency of Communication with Friends and Family: More than three times a week    Frequency of Social Gatherings with Friends and Family: Once a week    Attends Religious Services: More than 4 times per year    Active Member of [employer] or Organizations: No    Attends Banker Meetings: Never    Marital Status: Married     Review of Systems  Constitutional:  Positive for fatigue. Negative for fever.  HENT:  Positive for congestion and sinus pressure.   Respiratory:  Positive for cough. Negative for chest tightness and shortness of breath.   Cardiovascular:  Negative for chest pain and palpitations.  Gastrointestinal:  Negative for abdominal pain, nausea and vomiting.       Previous diarrhea as outlined.   Genitourinary:  Negative for difficulty urinating and dysuria.  Musculoskeletal:  Negative for joint swelling and myalgias.  Skin:  Negative for color change and rash.  Neurological:  Negative for dizziness and headaches.  Psychiatric/Behavioral:  Negative for agitation and dysphoric mood.        Objective:     BP 136/68   Pulse 67   Temp 98.8 F (37.1 C) (Oral)   Ht 5' (1.524 m)   Wt 141 lb 12.8 oz (64.3 kg)   SpO2 99%   BMI 27.69 kg/m  Wt Readings from Last 3 Encounters:  11/08/23 141 lb 12.8 oz (64.3 kg)  10/12/23 141 lb (64 kg)   08/31/23 144 lb 6 oz (65.5 kg)    Physical Exam Vitals reviewed.  Constitutional:      General: She is not in acute distress.    Appearance: Normal appearance.  HENT:     Head: Normocephalic and atraumatic.     Right Ear: External ear normal.     Left Ear: External ear normal.  Eyes:     General: No scleral icterus.       Right eye: No discharge.        Left eye: No discharge.     Conjunctiva/sclera: Conjunctivae normal.  Neck:     Thyroid: No thyromegaly.  Cardiovascular:     Rate and Rhythm: Normal rate  and regular rhythm.  Pulmonary:     Effort: No respiratory distress.     Breath sounds: Normal breath sounds. No wheezing.     Comments: Increased cough with forced expiration.  Abdominal:     General: Bowel sounds are normal.     Palpations: Abdomen is soft.     Tenderness: There is no abdominal tenderness.  Musculoskeletal:        General: No swelling or tenderness.     Cervical back: Neck supple. No tenderness.  Lymphadenopathy:     Cervical: No cervical adenopathy.  Skin:    Findings: No erythema or rash.  Neurological:     Mental Status: She is alert.  Psychiatric:        Mood and Affect: Mood normal.        Behavior: Behavior normal.      Outpatient Encounter Medications as of 11/08/2023  Medication Sig   Ascorbic Acid (VITAMIN C) 1000 MG tablet Take 1,000 mg by mouth daily.   aspirin 81 MG tablet Take 81 mg by mouth daily.   Azelastine HCl 137 MCG/SPRAY SOLN INSTILL TWO SPRAYS INTO EACH NOSTIL TWICE A DAY AS DIRECTED.   benzonatate (TESSALON) 200 MG capsule Take 1 capsule (200 mg total) by mouth 3 (three) times daily as needed for cough.   Biotin 5 MG CAPS Take by mouth daily.   calcium citrate-vitamin D 500-400 MG-UNIT chewable tablet Chew 1 tablet by mouth 2 (two) times daily.   cholecalciferol (VITAMIN D) 1000 UNITS tablet Take 2,000 Units by mouth in the morning and at bedtime.    doxycycline (VIBRA-TABS) 100 MG tablet Take 1 tablet (100 mg total)  by mouth 2 (two) times daily.   fluticasone (FLONASE) 50 MCG/ACT nasal spray Place 2 sprays into both nostrils daily.   gabapentin (NEURONTIN) 100 MG capsule Take 2 capsules (200 mg total) by mouth at bedtime.   irbesartan (AVAPRO) 75 MG tablet Take 1 tablet (75 mg total) by mouth daily.   levocetirizine (XYZAL) 5 MG tablet TAKE 1 TABLET EVERY EVENING   lidocaine (LIDODERM) 5 % Place 1 patch onto the skin as needed. Remove & Discard patch within 12 hours or as directed by MD   Magnesium Glycinate 100 MG CAPS Take 200 mg by mouth daily.   methocarbamol (ROBAXIN) 500 MG tablet Take 1 tablet (500 mg total) by mouth at bedtime as needed for muscle spasms. (Patient taking differently: Take 500 mg by mouth at bedtime as needed for muscle spasms. Pt taking prn)   Misc Natural Products (TART CHERRY ADVANCED) CAPS Take 1 capsule by mouth 2 (two) times daily. Reported on 03/04/2016   MULTIPLE MINERALS-VITAMINS PO    PEPPERMINT OIL PO Take by mouth.   predniSONE (DELTASONE) 10 MG tablet Take 4 tablets x 1 day and then decrease 1/2 tablet per day until down to zer mg.   RABEprazole (ACIPHEX) 20 MG tablet Take 1 tablet (20 mg total) by mouth 2 (two) times daily.   RESTASIS 0.05 % ophthalmic emulsion Place 1 drop into both eyes 2 (two) times daily.   rosuvastatin (CRESTOR) 5 MG tablet Take 1 tablet (5 mg total) by mouth daily.   sucralfate (CARAFATE) 1 g tablet Take 1 tablet (1 g total) by mouth 2 (two) times daily.   triamterene-hydrochlorothiazide (MAXZIDE-25) 37.5-25 MG tablet Take 0.5 tablets by mouth daily.   No facility-administered encounter medications on file as of 11/08/2023.     Lab Results  Component Value Date   WBC 6.1  09/14/2023   HGB 13.1 09/14/2023   HCT 40.1 09/14/2023   PLT 217.0 09/14/2023   GLUCOSE 105 (H) 09/14/2023   CHOL 171 09/14/2023   TRIG 95.0 09/14/2023   HDL 74.10 09/14/2023   LDLDIRECT 146.9 01/31/2014   LDLCALC 77 09/14/2023   ALT 15 09/14/2023   AST 17 09/14/2023    NA 138 09/14/2023   K 3.8 09/14/2023   CL 102 09/14/2023   CREATININE 0.91 09/14/2023   BUN 20 09/14/2023   CO2 28 09/14/2023   TSH 3.03 04/26/2023   HGBA1C 6.2 09/14/2023       Assessment & Plan:  Persistent cough -     DG Chest 2 View; Future  Upper respiratory tract infection, unspecified type Assessment & Plan: Symptoms appear to be c/w sinus/URI.  Recent covid infection as outlined.  Now with increased sinus pressure/head fullness, nasal colored mucus production and persistent increased cough with coughing fits.  Treat with doxycycline as directed.  Saline nasal spray and steroid nasal spray.  Prednisone taper.  Can continue mucinex.  Follow.  Call with update.  Given persistent increased cough, check cxr.    Primary hypertension Assessment & Plan: Continue avapro and triam/hctz.  Blood pressures as outlined.  Follow pressures.   Other orders -     Doxycycline Hyclate; Take 1 tablet (100 mg total) by mouth 2 (two) times daily.  Dispense: 20 tablet; Refill: 0 -     predniSONE; Take 4 tablets x 1 day and then decrease 1/2 tablet per day until down to zer mg.  Dispense: 18 tablet; Refill: 0     Dale Everetts, MD

## 2023-11-13 ENCOUNTER — Encounter: Payer: Self-pay | Admitting: Internal Medicine

## 2023-11-13 DIAGNOSIS — J069 Acute upper respiratory infection, unspecified: Secondary | ICD-10-CM | POA: Insufficient documentation

## 2023-11-13 NOTE — Assessment & Plan Note (Signed)
Continue avapro and triam/hctz.  Blood pressures as outlined.  Follow pressures.

## 2023-11-13 NOTE — Assessment & Plan Note (Signed)
Symptoms appear to be c/w sinus/URI.  Recent covid infection as outlined.  Now with increased sinus pressure/head fullness, nasal colored mucus production and persistent increased cough with coughing fits.  Treat with doxycycline as directed.  Saline nasal spray and steroid nasal spray.  Prednisone taper.  Can continue mucinex.  Follow.  Call with update.  Given persistent increased cough, check cxr.

## 2023-11-25 ENCOUNTER — Encounter: Payer: Self-pay | Admitting: Internal Medicine

## 2023-11-25 DIAGNOSIS — H6123 Impacted cerumen, bilateral: Secondary | ICD-10-CM | POA: Diagnosis not present

## 2023-11-25 DIAGNOSIS — H903 Sensorineural hearing loss, bilateral: Secondary | ICD-10-CM | POA: Diagnosis not present

## 2023-11-25 MED ORDER — NYSTATIN 100000 UNIT/GM EX CREA: 1.0000 | TOPICAL_CREAM | Freq: Two times a day (BID) | CUTANEOUS | 0 refills | Status: DC

## 2023-11-25 NOTE — Telephone Encounter (Signed)
Nkda. Recommended monistat but pt stated that irritation is more on the outside of the vaginal area. Discussed with Dr Lorin Picket. Sent in nystatin. Pt is aware.

## 2023-11-25 NOTE — Telephone Encounter (Signed)
Confirm nkda.  If no, then ok to try monistat 3 - one applicator q hs x 3 nights.  If persistent problems, let us know.

## 2023-11-28 ENCOUNTER — Other Ambulatory Visit: Payer: Self-pay | Admitting: Internal Medicine

## 2023-12-29 ENCOUNTER — Other Ambulatory Visit (INDEPENDENT_AMBULATORY_CARE_PROVIDER_SITE_OTHER): Payer: Medicare HMO

## 2023-12-29 DIAGNOSIS — R944 Abnormal results of kidney function studies: Secondary | ICD-10-CM

## 2023-12-29 LAB — BASIC METABOLIC PANEL
BUN: 23 mg/dL (ref 6–23)
CO2: 28 meq/L (ref 19–32)
Calcium: 9.5 mg/dL (ref 8.4–10.5)
Chloride: 102 meq/L (ref 96–112)
Creatinine, Ser: 0.88 mg/dL (ref 0.40–1.20)
GFR: 62.3 mL/min (ref >60.00)
Glucose, Bld: 99 mg/dL (ref 70–99)
Potassium: 3.9 meq/L (ref 3.5–5.1)
Sodium: 137 meq/L (ref 135–145)

## 2024-01-04 ENCOUNTER — Ambulatory Visit (INDEPENDENT_AMBULATORY_CARE_PROVIDER_SITE_OTHER): Payer: Medicare HMO | Admitting: Internal Medicine

## 2024-01-04 ENCOUNTER — Encounter: Payer: Self-pay | Admitting: Internal Medicine

## 2024-01-04 ENCOUNTER — Telehealth: Payer: Self-pay | Admitting: Internal Medicine

## 2024-01-04 VITALS — BP 116/70 | HR 75 | Temp 97.9°F | Resp 16 | Ht 60.0 in | Wt 137.0 lb

## 2024-01-04 DIAGNOSIS — E7800 Pure hypercholesterolemia, unspecified: Secondary | ICD-10-CM

## 2024-01-04 DIAGNOSIS — R739 Hyperglycemia, unspecified: Secondary | ICD-10-CM

## 2024-01-04 DIAGNOSIS — Z853 Personal history of malignant neoplasm of breast: Secondary | ICD-10-CM | POA: Diagnosis not present

## 2024-01-04 DIAGNOSIS — G4733 Obstructive sleep apnea (adult) (pediatric): Secondary | ICD-10-CM | POA: Diagnosis not present

## 2024-01-04 DIAGNOSIS — K449 Diaphragmatic hernia without obstruction or gangrene: Secondary | ICD-10-CM | POA: Diagnosis not present

## 2024-01-04 DIAGNOSIS — F439 Reaction to severe stress, unspecified: Secondary | ICD-10-CM | POA: Diagnosis not present

## 2024-01-04 DIAGNOSIS — R1010 Upper abdominal pain, unspecified: Secondary | ICD-10-CM

## 2024-01-04 DIAGNOSIS — I10 Essential (primary) hypertension: Secondary | ICD-10-CM

## 2024-01-04 DIAGNOSIS — J302 Other seasonal allergic rhinitis: Secondary | ICD-10-CM

## 2024-01-04 DIAGNOSIS — K219 Gastro-esophageal reflux disease without esophagitis: Secondary | ICD-10-CM

## 2024-01-04 MED ORDER — LEVOCETIRIZINE DIHYDROCHLORIDE 5 MG PO TABS: 5.0000 mg | ORAL_TABLET | Freq: Every evening | ORAL | 3 refills | Status: DC

## 2024-01-04 NOTE — Telephone Encounter (Signed)
 LM for pt with husband. She needs fasting lab appointment in 1-2 weeks. Please schedule

## 2024-01-04 NOTE — Progress Notes (Signed)
Subjective:    Patient ID: Carla Ryan, female    DOB: 1946/07/25, 78 y.o.   MRN: [id number]  Patient here for  Chief Complaint  Patient presents with   Medical Management of Chronic Issues    HPI Here for a scheduled follow up - f/u regarding hypertension and hypercholesterolemia. Using cpap. Seeing Dr Mickeal Needy - f/u neck and back pain. S/p ESI - L3-L4 09/2023. S/p injection in her hip - good results. Was evaluated 02/24/23 - oncology - [facility]. 9 years out - from breast cancer diagnosis. Off anti hormone therapy for one year. Graduated from the [facility] to survivorship program. Had f/u with cardiology 09/13/23 - stable.  F/u 6 months. Request refill xyzal - control allergies. Hearing aids. Taking psyllium capsules to help keep bowels moving. Discussed cpap.    Past Medical History:  Diagnosis Date   Abdominal hernia    Anxiety    situational stress and anxiety   Cancer (HCC) 2015   breast    Cataract cortical, senile, unspecified laterality    Chest pain    Exertional dyspnea    GERD (gastroesophageal reflux disease)    Hiatal hernia    Hiatal hernia    Hypercholesterolemia    Hyperlipidemia    Hypertension    Osteoarthritis    scoliosis, degenerative disc dz, knee, carpal tunnel   PONV (postoperative nausea and vomiting)    Sleep apnea    Past Surgical History:  Procedure Laterality Date   ABDOMINAL HYSTERECTOMY  07/2000   APPENDECTOMY  2000   APPENDECTOMY     BREAST BIOPSY Bilateral 2014   BREAST SURGERY Right 2015   lumpectomy   CARPAL TUNNEL RELEASE     CATARACT EXTRACTION W/PHACO Right 02/16/2023   Procedure: CATARACT EXTRACTION PHACO AND INTRAOCULAR LENS PLACEMENT (IOC) RIGHT  7.20  00:39.2;  Surgeon: Lockie Mola, MD;  Location: [facility];  Service: Ophthalmology;  Laterality: Right;   CATARACT EXTRACTION W/PHACO Left 03/02/2023   Procedure: CATARACT EXTRACTION PHACO AND INTRAOCULAR LENS PLACEMENT (IOC) LEFT  6.41   00:42.0;  Surgeon: Lockie Mola, MD;  Location: [facility];  Service: Ophthalmology;  Laterality: Left;   COLONOSCOPY  2011   Barnetta Chapel, [name]: Normal exam   COLONOSCOPY WITH PROPOFOL N/A 04/03/2020   Procedure: COLONOSCOPY WITH PROPOFOL;  Surgeon: Toledo, Boykin Nearing, MD;  Location: [facility];  Service: Gastroenterology;  Laterality: N/A;   ESOPHAGOGASTRODUODENOSCOPY (EGD) WITH PROPOFOL N/A 04/03/2020   Procedure: ESOPHAGOGASTRODUODENOSCOPY (EGD) WITH PROPOFOL;  Surgeon: Toledo, Boykin Nearing, MD;  Location: [facility];  Service: Gastroenterology;  Laterality: N/A;   HERNIA REPAIR  02/10/15   Laparoscopic Repair of Lateral Abdominal Wall Hernia  02/10/15   MASTECTOMY     UPPER GI ENDOSCOPY  05/10/2014   Barnetta Chapel, [name] Bile gastritis, medium hiatal hernia. Incomplete visualization of the cardia and fundus.   Family History  Problem Relation Age of Onset   Diabetes Mother    Multiple myeloma Mother    Multiple myeloma Father    Diabetes Sister        x2   Diabetes Brother    Hypertension Brother    Dementia Brother    Social History   Socioeconomic History   Marital status: Married    Spouse name: Angelene Giovanni   Number of children: 2   Years of education: Not on file   Highest education level: 12th grade  Occupational History   Occupation: retired  Tobacco Use   Smoking status: Never  Smokeless tobacco: Never  Vaping Use   Vaping status: Never Used  Substance and Sexual Activity   Alcohol use: Not Currently    Comment: wine occasionally   Drug use: No   Sexual activity: Yes  Other Topics Concern   Not on file  Social History Narrative   Not on file   Social Drivers of Health   Financial Resource Strain: Low Risk  (10/12/2023)   Overall Financial Resource Strain (CARDIA)    Difficulty of Paying Living Expenses: Not hard at all  Food Insecurity: No Food Insecurity (10/12/2023)   Hunger Vital Sign    Worried About Running Out of Food in the  Last Year: Never true    Ran Out of Food in the Last Year: Never true  Transportation Needs: No Transportation Needs (10/12/2023)   PRAPARE - Administrator, Civil Service (Medical): No    Lack of Transportation (Non-Medical): No  Physical Activity: Insufficiently Active (10/12/2023)   Exercise Vital Sign    Days of Exercise per Week: 3 days    Minutes of Exercise per Session: 10 min  Stress: No Stress Concern Present (10/12/2023)   [employer] of Occupational Health - Occupational Stress Questionnaire    Feeling of Stress : Only a little  Social Connections: Moderately Integrated (10/12/2023)   Social Connection and Isolation Panel [NHANES]    Frequency of Communication with Friends and Family: More than three times a week    Frequency of Social Gatherings with Friends and Family: Once a week    Attends Religious Services: More than 4 times per year    Active Member of [employer] or Organizations: No    Attends Banker Meetings: Never    Marital Status: Married     Review of Systems  Constitutional:  Negative for appetite change and unexpected weight change.  HENT:  Negative for congestion and sinus pressure.   Respiratory:  Negative for cough, chest tightness and shortness of breath.   Cardiovascular:  Negative for chest pain and palpitations.  Gastrointestinal:  Negative for diarrhea, nausea and vomiting.  Genitourinary:  Negative for difficulty urinating and dysuria.  Musculoskeletal:        Seeing Dr Mickeal Needy - back /hip pain.   Skin:  Negative for color change and rash.  Neurological:  Negative for dizziness.       Some headache at night - discussed cpap.   Psychiatric/Behavioral:  Negative for agitation and dysphoric mood.        Objective:     BP 116/70   Pulse 75   Temp 97.9 F (36.6 C)   Resp 16   Ht 5' (1.524 m)   Wt 137 lb (62.1 kg)   SpO2 98%   BMI 26.76 kg/m  Wt Readings from Last 3 Encounters:  01/04/24 137 lb (62.1  kg)  11/08/23 141 lb 12.8 oz (64.3 kg)  10/12/23 141 lb (64 kg)    Physical Exam Vitals reviewed.  Constitutional:      General: She is not in acute distress.    Appearance: Normal appearance.  HENT:     Head: Normocephalic and atraumatic.     Right Ear: External ear normal.     Left Ear: External ear normal.     Mouth/Throat:     Pharynx: No oropharyngeal exudate or posterior oropharyngeal erythema.  Eyes:     General: No scleral icterus.       Right eye: No discharge.  Left eye: No discharge.     Conjunctiva/sclera: Conjunctivae normal.  Neck:     Thyroid: No thyromegaly.  Cardiovascular:     Rate and Rhythm: Normal rate and regular rhythm.  Pulmonary:     Effort: No respiratory distress.     Breath sounds: Normal breath sounds. No wheezing.  Abdominal:     General: Bowel sounds are normal.     Palpations: Abdomen is soft.     Tenderness: There is no abdominal tenderness.  Musculoskeletal:        General: No swelling or tenderness.     Cervical back: Neck supple. No tenderness.  Lymphadenopathy:     Cervical: No cervical adenopathy.  Skin:    Findings: No erythema or rash.  Neurological:     Mental Status: She is alert.  Psychiatric:        Mood and Affect: Mood normal.        Behavior: Behavior normal.         Outpatient Encounter Medications as of 01/04/2024  Medication Sig   Ascorbic Acid (VITAMIN C) 1000 MG tablet Take 1,000 mg by mouth daily.   aspirin 81 MG tablet Take 81 mg by mouth daily.   Azelastine HCl 137 MCG/SPRAY SOLN INSTILL TWO SPRAYS INTO EACH NOSTIL TWICE A DAY AS DIRECTED.   Biotin 5 MG CAPS Take by mouth daily.   calcium citrate-vitamin D 500-400 MG-UNIT chewable tablet Chew 1 tablet by mouth 2 (two) times daily.   cholecalciferol (VITAMIN D) 1000 UNITS tablet Take 2,000 Units by mouth in the morning and at bedtime.    fluticasone (FLONASE) 50 MCG/ACT nasal spray USE 2 SPRAYS IN EACH NOSTRIL EVERY DAY   gabapentin (NEURONTIN) 100  MG capsule Take 2 capsules (200 mg total) by mouth at bedtime.   irbesartan (AVAPRO) 75 MG tablet Take 1 tablet (75 mg total) by mouth daily.   levocetirizine (XYZAL) 5 MG tablet Take 1 tablet (5 mg total) by mouth every evening.   lidocaine (LIDODERM) 5 % Place 1 patch onto the skin as needed. Remove & Discard patch within 12 hours or as directed by MD   Magnesium Glycinate 100 MG CAPS Take 200 mg by mouth daily.   methocarbamol (ROBAXIN) 500 MG tablet Take 1 tablet (500 mg total) by mouth at bedtime as needed for muscle spasms. (Patient taking differently: Take 500 mg by mouth at bedtime as needed for muscle spasms. Pt taking prn)   Misc Natural Products (TART CHERRY ADVANCED) CAPS Take 1 capsule by mouth 2 (two) times daily. Reported on 03/04/2016   MULTIPLE MINERALS-VITAMINS PO    PEPPERMINT OIL PO Take by mouth.   RABEprazole (ACIPHEX) 20 MG tablet Take 1 tablet (20 mg total) by mouth 2 (two) times daily.   RESTASIS 0.05 % ophthalmic emulsion Place 1 drop into both eyes 2 (two) times daily.   rosuvastatin (CRESTOR) 5 MG tablet Take 1 tablet (5 mg total) by mouth daily.   sucralfate (CARAFATE) 1 g tablet Take 1 tablet (1 g total) by mouth 2 (two) times daily.   triamterene-hydrochlorothiazide (MAXZIDE-25) 37.5-25 MG tablet Take 0.5 tablets by mouth daily.   [DISCONTINUED] benzonatate (TESSALON) 200 MG capsule Take 1 capsule (200 mg total) by mouth 3 (three) times daily as needed for cough.   [DISCONTINUED] doxycycline (VIBRA-TABS) 100 MG tablet Take 1 tablet (100 mg total) by mouth 2 (two) times daily.   [DISCONTINUED] levocetirizine (XYZAL) 5 MG tablet TAKE 1 TABLET EVERY EVENING   [DISCONTINUED] nystatin cream (  MYCOSTATIN) Apply 1 Application topically 2 (two) times daily.   [DISCONTINUED] predniSONE (DELTASONE) 10 MG tablet Take 4 tablets x 1 day and then decrease 1/2 tablet per day until down to zer mg.   No facility-administered encounter medications on file as of 01/04/2024.     Lab  Results  Component Value Date   WBC 6.1 09/14/2023   HGB 13.1 09/14/2023   HCT 40.1 09/14/2023   PLT 217.0 09/14/2023   GLUCOSE 99 12/29/2023   CHOL 171 09/14/2023   TRIG 95.0 09/14/2023   HDL 74.10 09/14/2023   LDLDIRECT 146.9 01/31/2014   LDLCALC 77 09/14/2023   ALT 15 09/14/2023   AST 17 09/14/2023   NA 137 12/29/2023   K 3.9 12/29/2023   CL 102 12/29/2023   CREATININE 0.88 12/29/2023   BUN 23 12/29/2023   CO2 28 12/29/2023   TSH 3.03 04/26/2023   HGBA1C 6.2 09/14/2023    No results found.     Assessment & Plan:  Upper abdominal pain Assessment & Plan: Mentioned some intermittent epigastric pain. Previously had abdominal ultrasound - no acute abnormality noted. Continues on aciphex. Discussed further w/up.  Notify me if desires further testing/evaluation.    Stress Assessment & Plan: Overall appears to be handling things relatively well.  Follow.    Seasonal allergies Assessment & Plan: Continue xyzal.    Paraesophageal hiatal hernia Assessment & Plan: Saw Sedonia Small.  Elected watchful waiting.     Obstructive sleep apnea Assessment & Plan: Continue cpap.    Primary hypertension Assessment & Plan: Continue avapro and triam/hctz.  Blood pressures as outlined.  Follow pressures.   Hyperglycemia Assessment & Plan: Low carb diet and exercise.  Follow met b and a1c.     Hypercholesterolemia Assessment & Plan: Continue crestor.  Low cholesterol diet and exercise.  Follow lipid panel and liver function tests.     History of breast cancer Assessment & Plan: Mammogram 02/24/23- [facility] II.  Was evaluated 02/24/23 - oncology - [facility]. 9 years out - from breast cancer diagnosis. Off anti hormone therapy for one year. Graduated from the [facility] to survivorship program.   Gastroesophageal reflux disease without esophagitis Assessment & Plan: Has a large paraesophageal hernia.  On aciphex and carafate.  Has seen GI.  Evaluated by Dr Sedonia Small.  Elected watchful waiting.     Other orders -     Levocetirizine Dihydrochloride; Take 1 tablet (5 mg total) by mouth every evening.  Dispense: 90 tablet; Refill: 3     Dale Foster, MD

## 2024-01-04 NOTE — Telephone Encounter (Signed)
 Pt was in during OV inquiring about the Doctor stating about doing further labs. Please advise

## 2024-01-05 NOTE — Telephone Encounter (Signed)
Fasting labs ordered

## 2024-01-08 ENCOUNTER — Encounter: Payer: Self-pay | Admitting: Internal Medicine

## 2024-01-08 NOTE — Assessment & Plan Note (Signed)
Continue xyzal ?

## 2024-01-08 NOTE — Assessment & Plan Note (Signed)
Low carb diet and exercise.  Follow met b and a1c.   

## 2024-01-08 NOTE — Assessment & Plan Note (Signed)
Overall appears to be handling things relatively well.  Follow.   

## 2024-01-08 NOTE — Assessment & Plan Note (Signed)
Has a large paraesophageal hernia.  On aciphex and carafate.  Has seen GI.  Evaluated by Dr Sedonia Small.  Elected watchful waiting.

## 2024-01-08 NOTE — Assessment & Plan Note (Signed)
Mentioned some intermittent epigastric pain. Previously had abdominal ultrasound - no acute abnormality noted. Continues on aciphex. Discussed further w/up.  Notify me if desires further testing/evaluation.

## 2024-01-08 NOTE — Assessment & Plan Note (Signed)
Continue cpap.  

## 2024-01-08 NOTE — Assessment & Plan Note (Signed)
 Continue avapro and triam/hctz.  Blood pressures as outlined.  Follow pressures.

## 2024-01-08 NOTE — Assessment & Plan Note (Signed)
Saw Carla Ryan.  Elected watchful waiting.   

## 2024-01-08 NOTE — Assessment & Plan Note (Signed)
Continue crestor.  Low cholesterol diet and exercise.  Follow lipid panel and liver function tests.  

## 2024-01-08 NOTE — Assessment & Plan Note (Addendum)
Mammogram 02/24/23- Birads II.  Was evaluated 02/24/23 - oncology - [facility]. 9 years out - from breast cancer diagnosis. Off anti hormone therapy for one year. Graduated from the [facility] to survivorship program.

## 2024-01-09 DIAGNOSIS — M5416 Radiculopathy, lumbar region: Secondary | ICD-10-CM | POA: Diagnosis not present

## 2024-01-09 DIAGNOSIS — M48062 Spinal stenosis, lumbar region with neurogenic claudication: Secondary | ICD-10-CM | POA: Diagnosis not present

## 2024-01-13 ENCOUNTER — Other Ambulatory Visit (INDEPENDENT_AMBULATORY_CARE_PROVIDER_SITE_OTHER): Payer: Medicare HMO

## 2024-01-13 DIAGNOSIS — E7800 Pure hypercholesterolemia, unspecified: Secondary | ICD-10-CM | POA: Diagnosis not present

## 2024-01-13 DIAGNOSIS — R739 Hyperglycemia, unspecified: Secondary | ICD-10-CM

## 2024-01-13 LAB — HEPATIC FUNCTION PANEL
ALT: 19 U/L (ref 0–35)
AST: 19 U/L (ref 0–37)
Albumin: 4 g/dL (ref 3.5–5.2)
Alkaline Phosphatase: 44 U/L (ref 39–117)
Bilirubin, Direct: 0.1 mg/dL (ref 0.0–0.3)
Total Bilirubin: 0.4 mg/dL (ref 0.2–1.2)
Total Protein: 6.1 g/dL (ref 6.0–8.3)

## 2024-01-13 LAB — HEMOGLOBIN A1C: Hgb A1c MFr Bld: 6.4 % (ref 4.6–6.5)

## 2024-01-13 LAB — LIPID PANEL
Cholesterol: 154 mg/dL (ref 0–200)
HDL: 64.7 mg/dL (ref >39.00)
LDL Cholesterol: 63 mg/dL (ref 0–99)
NonHDL: 89.19
Total CHOL/HDL Ratio: 2
Triglycerides: 129 mg/dL (ref 0.0–149.0)
VLDL: 25.8 mg/dL (ref 0.0–40.0)

## 2024-01-13 LAB — BASIC METABOLIC PANEL
BUN: 18 mg/dL (ref 6–23)
CO2: 30 meq/L (ref 19–32)
Calcium: 9.4 mg/dL (ref 8.4–10.5)
Chloride: 101 meq/L (ref 96–112)
Creatinine, Ser: 0.87 mg/dL (ref 0.40–1.20)
GFR: 63.14 mL/min (ref >60.00)
Glucose, Bld: 90 mg/dL (ref 70–99)
Potassium: 4 meq/L (ref 3.5–5.1)
Sodium: 136 meq/L (ref 135–145)

## 2024-01-20 ENCOUNTER — Ambulatory Visit
Admission: RE | Admit: 2024-01-20 | Discharge: 2024-01-20 | Disposition: A | Discharge status: Home / Self Care | Payer: Medicare HMO | Source: Ambulatory Visit | Attending: Internal Medicine | Admitting: Internal Medicine

## 2024-01-20 DIAGNOSIS — M85832 Other specified disorders of bone density and structure, left forearm: Secondary | ICD-10-CM | POA: Diagnosis not present

## 2024-01-20 DIAGNOSIS — Z780 Asymptomatic menopausal state: Secondary | ICD-10-CM | POA: Insufficient documentation

## 2024-01-30 DIAGNOSIS — M5416 Radiculopathy, lumbar region: Secondary | ICD-10-CM | POA: Diagnosis not present

## 2024-01-30 DIAGNOSIS — M5126 Other intervertebral disc displacement, lumbar region: Secondary | ICD-10-CM | POA: Diagnosis not present

## 2024-01-30 DIAGNOSIS — M48062 Spinal stenosis, lumbar region with neurogenic claudication: Secondary | ICD-10-CM | POA: Diagnosis not present

## 2024-03-01 ENCOUNTER — Other Ambulatory Visit: Payer: Self-pay | Admitting: Internal Medicine

## 2024-03-01 DIAGNOSIS — R92323 Mammographic fibroglandular density, bilateral breasts: Secondary | ICD-10-CM | POA: Diagnosis not present

## 2024-03-01 DIAGNOSIS — Z9221 Personal history of antineoplastic chemotherapy: Secondary | ICD-10-CM | POA: Diagnosis not present

## 2024-03-01 DIAGNOSIS — Z923 Personal history of irradiation: Secondary | ICD-10-CM | POA: Diagnosis not present

## 2024-03-01 DIAGNOSIS — Z9889 Other specified postprocedural states: Secondary | ICD-10-CM | POA: Diagnosis not present

## 2024-03-01 DIAGNOSIS — Z853 Personal history of malignant neoplasm of breast: Secondary | ICD-10-CM | POA: Diagnosis not present

## 2024-03-01 DIAGNOSIS — Z1231 Encounter for screening mammogram for malignant neoplasm of breast: Secondary | ICD-10-CM | POA: Diagnosis not present

## 2024-03-01 DIAGNOSIS — Z170 Estrogen receptor positive status [ER+]: Secondary | ICD-10-CM | POA: Diagnosis not present

## 2024-03-01 DIAGNOSIS — I10 Essential (primary) hypertension: Secondary | ICD-10-CM | POA: Diagnosis not present

## 2024-03-01 DIAGNOSIS — C50211 Malignant neoplasm of upper-inner quadrant of right female breast: Secondary | ICD-10-CM | POA: Diagnosis not present

## 2024-03-01 DIAGNOSIS — Z08 Encounter for follow-up examination after completed treatment for malignant neoplasm: Secondary | ICD-10-CM | POA: Diagnosis not present

## 2024-03-08 DIAGNOSIS — L0390 Cellulitis, unspecified: Secondary | ICD-10-CM | POA: Diagnosis not present

## 2024-03-09 NOTE — Progress Notes (Unsigned)
 Sleep Medicine   Office Visit  Patient Name: Carla Ryan DOB: [birth year] MRN [id number]    Chief Complaint: sleep apnea  Brief History:  Carla Ryan presents for an initial consult for sleep evaluation and to establish care. The patient has a 11 year history of sleep apnea and is currently on a CPAP. Prior to using a PAP, sleep quality was poor. This was noted every nights. The patient reported the following symptoms:  snoring, fatigue, witnessed apneic pauses, and headaches. The patient goes to sleep at 1130 pm and wakes up at 0800 am. The patient no history of psychiatric problems. The Epworth Sleepiness Score is 10 out of 24 . The patient relates  Cardiovascular risk factors include: hypertension. The patient is currently on a APAP@ 8-11 cmH2O. However, the patient's machine is currently 80 years old, reached end of useful life and obsolete for repair. In order to assure reliable treatment for OSA, patient's machine should be replaced.  The patient reports using her PAP and feels rested after sleeping with PAP.  The patient reports benefiting from PAP use and would like for her to continue using PAP. Reported sleepiness is  improved. The compliance download shows  97% compliance with an average use time of 7 hours 20  minutes. The AHI is 2.3. Leakage is 34.9. 95th % pressure is 10.9.  The patient continues to require PAP therapy as a medical necessity in order to eliminate her sleep apnea.    ROS  General: (-) fever, (-) chills, (-) night sweat Nose and Sinuses: (-) nasal stuffiness or itchiness, (-) postnasal drip, (-) nosebleeds, (-) sinus trouble. Mouth and Throat: (-) sore throat, (-) hoarseness. Neck: (-) swollen glands, (-) enlarged thyroid, (-) neck pain. Respiratory: - cough, + shortness of breath, - wheezing. Neurologic: + numbness, - tingling occasional episodes in leg Psychiatric: - anxiety, - depression Sleep behavior: -sleep paralysis -hypnogogic hallucinations -dream enactment       -vivid dreams -cataplexy -night terrors -sleep walking   Current Medication: Outpatient Encounter Medications as of 03/12/2024  Medication Sig Note   Ascorbic Acid (VITAMIN C) 1000 MG tablet Take 1,000 mg by mouth daily.    aspirin 81 MG tablet Take 81 mg by mouth daily.    Azelastine HCl 137 MCG/SPRAY SOLN INSTILL TWO SPRAYS INTO EACH NOSTIL TWICE A DAY AS DIRECTED.    Biotin 5 MG CAPS Take by mouth daily.    calcium citrate-vitamin D 500-400 MG-UNIT chewable tablet Chew 1 tablet by mouth 2 (two) times daily.    cholecalciferol (VITAMIN D) 1000 UNITS tablet Take 2,000 Units by mouth in the morning and at bedtime.     fluticasone (FLONASE) 50 MCG/ACT nasal spray USE 2 SPRAYS IN EACH NOSTRIL EVERY DAY    gabapentin (NEURONTIN) 100 MG capsule Take 2 capsules (200 mg total) by mouth at bedtime. 10/12/2023: Takes 1-2 qhs   irbesartan (AVAPRO) 75 MG tablet TAKE 1 TABLET EVERY DAY    levocetirizine (XYZAL) 5 MG tablet Take 1 tablet (5 mg total) by mouth every evening.    lidocaine (LIDODERM) 5 % Place 1 patch onto the skin as needed. Remove & Discard patch within 12 hours or as directed by MD    Magnesium Glycinate 100 MG CAPS Take 200 mg by mouth daily. 10/12/2023: 240mg  daily   methocarbamol (ROBAXIN) 500 MG tablet Take 1 tablet (500 mg total) by mouth at bedtime as needed for muscle spasms. (Patient taking differently: Take 500 mg by mouth at bedtime as needed for  muscle spasms. Pt taking prn)    Misc Natural Products (TART CHERRY ADVANCED) CAPS Take 1 capsule by mouth 2 (two) times daily. Reported on 03/04/2016    MULTIPLE MINERALS-VITAMINS PO     PEPPERMINT OIL PO Take by mouth.    RABEprazole (ACIPHEX) 20 MG tablet Take 1 tablet (20 mg total) by mouth 2 (two) times daily.    RESTASIS 0.05 % ophthalmic emulsion Place 1 drop into both eyes 2 (two) times daily. 10/12/2023: Hasn't started yet   rosuvastatin (CRESTOR) 5 MG tablet Take 1 tablet (5 mg total) by mouth daily.    sucralfate  (CARAFATE) 1 g tablet Take 1 tablet (1 g total) by mouth 2 (two) times daily.    triamterene-hydrochlorothiazide (MAXZIDE-25) 37.5-25 MG tablet TAKE 1/2 TABLET EVERY DAY    No facility-administered encounter medications on file as of 03/12/2024.    Surgical History: Past Surgical History:  Procedure Laterality Date   ABDOMINAL HYSTERECTOMY  07/2000   APPENDECTOMY  2000   APPENDECTOMY     BREAST BIOPSY Bilateral 2014   BREAST SURGERY Right 2015   lumpectomy   CARPAL TUNNEL RELEASE     CATARACT EXTRACTION W/PHACO Right 02/16/2023   Procedure: CATARACT EXTRACTION PHACO AND INTRAOCULAR LENS PLACEMENT (IOC) RIGHT  7.20  00:39.2;  Surgeon: Lockie Mola, MD;  Location: [facility];  Service: Ophthalmology;  Laterality: Right;   CATARACT EXTRACTION W/PHACO Left 03/02/2023   Procedure: CATARACT EXTRACTION PHACO AND INTRAOCULAR LENS PLACEMENT (IOC) LEFT  6.41  00:42.0;  Surgeon: Lockie Mola, MD;  Location: [facility];  Service: Ophthalmology;  Laterality: Left;   COLONOSCOPY  2011   Barnetta Chapel, [name]: Normal exam   COLONOSCOPY WITH PROPOFOL N/A 04/03/2020   Procedure: COLONOSCOPY WITH PROPOFOL;  Surgeon: Toledo, Boykin Nearing, MD;  Location: [facility];  Service: Gastroenterology;  Laterality: N/A;   ESOPHAGOGASTRODUODENOSCOPY (EGD) WITH PROPOFOL N/A 04/03/2020   Procedure: ESOPHAGOGASTRODUODENOSCOPY (EGD) WITH PROPOFOL;  Surgeon: Toledo, Boykin Nearing, MD;  Location: [facility];  Service: Gastroenterology;  Laterality: N/A;   HERNIA REPAIR  02/10/15   Laparoscopic Repair of Lateral Abdominal Wall Hernia  02/10/15   MASTECTOMY     UPPER GI ENDOSCOPY  05/10/2014   Barnetta Chapel, [name] Bile gastritis, medium hiatal hernia. Incomplete visualization of the cardia and fundus.    Medical History: Past Medical History:  Diagnosis Date   Abdominal hernia    Anxiety    situational stress and anxiety   Cancer (HCC) 2015   breast    Cataract cortical, senile,  unspecified laterality    Chest pain    Exertional dyspnea    GERD (gastroesophageal reflux disease)    Hiatal hernia    Hiatal hernia    Hypercholesterolemia    Hyperlipidemia    Hypertension    Osteoarthritis    scoliosis, degenerative disc dz, knee, carpal tunnel   PONV (postoperative nausea and vomiting)    Sleep apnea     Family History: Non contributory to the present illness  Social History: Social History   Socioeconomic History   Marital status: Married    Spouse name: Corporate treasurer   Number of children: 2   Years of education: Not on file   Highest education level: 12th grade  Occupational History   Occupation: retired  Tobacco Use   Smoking status: Never   Smokeless tobacco: Never  Vaping Use   Vaping status: Never Used  Substance and Sexual Activity   Alcohol use: Not Currently    Comment: wine occasionally  Drug use: No   Sexual activity: Yes  Other Topics Concern   Not on file  Social History Narrative   Not on file   Social Drivers of Health   Financial Resource Strain: Low Risk  (10/12/2023)   Overall Financial Resource Strain (CARDIA)    Difficulty of Paying Living Expenses: Not hard at all  Food Insecurity: No Food Insecurity (10/12/2023)   Hunger Vital Sign    Worried About Running Out of Food in the Last Year: Never true    Ran Out of Food in the Last Year: Never true  Transportation Needs: No Transportation Needs (10/12/2023)   PRAPARE - Administrator, Civil Service (Medical): No    Lack of Transportation (Non-Medical): No  Physical Activity: Insufficiently Active (10/12/2023)   Exercise Vital Sign    Days of Exercise per Week: 3 days    Minutes of Exercise per Session: 10 min  Stress: No Stress Concern Present (10/12/2023)   [employer] of Occupational Health - Occupational Stress Questionnaire    Feeling of Stress : Only a little  Social Connections: Moderately Integrated (10/12/2023)   Social Connection and Isolation  Panel [NHANES]    Frequency of Communication with Friends and Family: More than three times a week    Frequency of Social Gatherings with Friends and Family: Once a week    Attends Religious Services: More than 4 times per year    Active Member of [employer] or Organizations: No    Attends Banker Meetings: Never    Marital Status: Married  Catering manager Violence: Not At Risk (10/12/2023)   Humiliation, Afraid, Rape, and Kick questionnaire    Fear of Current or Ex-Partner: No    Emotionally Abused: No    Physically Abused: No    Sexually Abused: No    Vital Signs: There were no vitals taken for this visit. There is no height or weight on file to calculate BMI.   Examination: General Appearance: The patient is well-developed, well-nourished, and in no distress. Neck Circumference: 39 cm Skin: Gross inspection of skin unremarkable. Head: normocephalic, no gross deformities. Eyes: no gross deformities noted. ENT: ears appear grossly normal Neurologic: Alert and oriented. No involuntary movements.    STOP BANG RISK ASSESSMENT S (snore) Have you been told that you snore?     NO   T (tired) Are you often tired, fatigued, or sleepy during the day?   NO  O (obstruction) Do you stop breathing, choke, or gasp during sleep? NO   P (pressure) Do you have or are you being treated for high blood pressure? YES   B (BMI) Is your body index greater than 35 kg/m? NO   A (age) Are you 57 years old or older? YES   N (neck) Do you have a neck circumference greater than 16 inches?   NO   G (gender) Are you a female? NO   TOTAL STOP/BANG "YES" ANSWERS 2                                                               A STOP-Bang score of 2 or less is considered low risk, and a score of 5 or more is high risk for having either moderate or severe OSA. For people  who score 3 or 4, doctors may need to perform further assessment to determine how likely they are to have OSA.          EPWORTH SLEEPINESS SCALE:  Scale:  (0)= no chance of dozing; (1)= slight chance of dozing; (2)= moderate chance of dozing; (3)= high chance of dozing  Chance  Situtation    Sitting and reading: 1    Watching TV: 2    Sitting Inactive in public: 0    As a passenger in car: 2      Lying down to rest: 3    Sitting and talking: 0    Sitting quielty after lunch: 2    In a car, stopped in traffic: 0   TOTAL SCORE:   10 out of 24    SLEEP STUDIES:  Not available   LABS: Recent Results (from the past 2160 hours)  Basic metabolic panel     Status: None   Collection Time: 12/29/23  9:24 AM  Result Value Ref Range   Sodium 137 135 - 145 mEq/L   Potassium 3.9 3.5 - 5.1 mEq/L   Chloride 102 96 - 112 mEq/L   CO2 28 19 - 32 mEq/L   Glucose, Bld 99 70 - 99 mg/dL   BUN 23 6 - 23 mg/dL   Creatinine, Ser 1.61 0.40 - 1.20 mg/dL   GFR 09.60 >45.40 mL/min    Comment: Calculated using the CKD-EPI Creatinine Equation (2021)   Calcium 9.5 8.4 - 10.5 mg/dL  Hemoglobin J8J     Status: None   Collection Time: 01/13/24  9:27 AM  Result Value Ref Range   Hgb A1c MFr Bld 6.4 4.6 - 6.5 %    Comment: Glycemic Control Guidelines for People with Diabetes:Non Diabetic:  <6%Goal of Therapy: <7%Additional Action Suggested:  >8%   Basic metabolic panel     Status: None   Collection Time: 01/13/24  9:27 AM  Result Value Ref Range   Sodium 136 135 - 145 mEq/L   Potassium 4.0 3.5 - 5.1 mEq/L   Chloride 101 96 - 112 mEq/L   CO2 30 19 - 32 mEq/L   Glucose, Bld 90 70 - 99 mg/dL   BUN 18 6 - 23 mg/dL   Creatinine, Ser 1.91 0.40 - 1.20 mg/dL   GFR 47.82 >95.62 mL/min    Comment: Calculated using the CKD-EPI Creatinine Equation (2021)   Calcium 9.4 8.4 - 10.5 mg/dL  Hepatic function panel     Status: None   Collection Time: 01/13/24  9:27 AM  Result Value Ref Range   Total Bilirubin 0.4 0.2 - 1.2 mg/dL   Bilirubin, Direct 0.1 0.0 - 0.3 mg/dL   Alkaline Phosphatase 44 39 - 117 U/L   AST  19 0 - 37 U/L   ALT 19 0 - 35 U/L   Total Protein 6.1 6.0 - 8.3 g/dL   Albumin 4.0 3.5 - 5.2 g/dL  Lipid panel     Status: None   Collection Time: 01/13/24  9:27 AM  Result Value Ref Range   Cholesterol 154 0 - 200 mg/dL    Comment: ATP III Classification       Desirable:  < 200 mg/dL               Borderline High:  200 - 239 mg/dL          High:  > = 130 mg/dL   Triglycerides 865.7 0.0 - 149.0 mg/dL    Comment:  Normal:  <150 mg/dLBorderline High:  150 - 199 mg/dL   HDL 16.10 >96.04 mg/dL   VLDL 54.0 0.0 - 98.1 mg/dL   LDL Cholesterol 63 0 - 99 mg/dL   Total CHOL/HDL Ratio 2     Comment:                Men          Women1/2 Average Risk     3.4          3.3Average Risk          5.0          4.42X Average Risk          9.6          7.13X Average Risk          15.0          11.0                       NonHDL 89.19     Comment: NOTE:  Non-HDL goal should be 30 mg/dL higher than patient's LDL goal (i.e. LDL goal of < 70 mg/dL, would have non-HDL goal of < 100 mg/dL)    Radiology: DG Bone Density Result Date: 01/20/2024 EXAM: DUAL X-RAY ABSORPTIOMETRY (DXA) FOR BONE MINERAL DENSITY IMPRESSION: Your patient Aubrionna Istre completed a BMD test on 01/20/2024 using the [employer] iDXA DXA System (software version: 14.10) manufactured by [employer]. The following summarizes the results of our evaluation. Technologist: SCE PATIENT BIOGRAPHICAL: Name: Avon, Molock Patient ID: [id number] Birth Date: 11-Apr-1946 Height: 59.0 in. Gender: Female Exam Date: 01/20/2024 Weight: 139.8 lbs. Indications: Advanced Age, Caucasian, Height Loss, History of Breast Cancer, History of Radiation, Hysterectomy, Oophorectomy Bilateral, Osteoarthritis, Postmenopausal Fractures: Treatments: Calcium, Vitamin D DENSITOMETRY RESULTS: Site         Region     Measured Date Measured Age WHO Classification Young Adult T-score BMD         %Change vs. Previous Significant Change (*) DualFemur Neck Left 01/20/20[age]  Osteopenia -1.2 0.869 g/cm2 - - Left Forearm Radius 33% 01/20/2024 [age] Osteopenia -1.6 0.739 g/cm2 - - ASSESSMENT: The BMD measured at Forearm Radius 33% is 0.739 g/cm2 with a T-score of -1.6. This patient is considered osteopenic according to World Health Organization [facility]) criteria. The scan quality is good. Lumbar spine was not utilized due to advanced degenerative changes. World Science writer [facility]) criteria for post-menopausal, Caucasian Women: Normal:                   T-score at or above -1 SD Osteopenia/low bone mass: T-score between -1 and -2.5 SD Osteoporosis:             T-score at or below -2.5 SD RECOMMENDATIONS: 1. All patients should optimize calcium and vitamin D intake. 2. Consider FDA-approved medical therapies in postmenopausal women and men aged 38 years and older, based on the following: a. A hip or vertebral(clinical or morphometric) fracture b. T-score < -2.5 at the femoral neck or spine after appropriate evaluation to exclude secondary causes c. Low bone mass (T-score between -1.0 and -2.5 at the femoral neck or spine) and a 10-year probability of a hip fracture > 3% or a 10-year probability of a major osteoporosis-related fracture > 20% based on the [place]-adapted WHO algorithm 3. Clinician judgment and/or patient preferences may indicate treatment for people with 10-year fracture probabilities above or below these levels FOLLOW-UP: People with diagnosed  cases of osteoporosis or at high risk for fracture should have regular bone mineral density tests. For patients eligible for Medicare, routine testing is allowed once every 2 years. The testing frequency can be increased to one year for patients who have rapidly progressing disease, those who are receiving or discontinuing medical therapy to restore bone mass, or have additional risk factors. I have reviewed this report, and agree with the above findings. [facility] Dear Dale Prospect, Your patient KATIANNA MCCLENNEY  completed a FRAX assessment on 01/20/2024 using the [facility] iDXA DXA System (analysis version: 14.10) manufactured by [employer]. The following summarizes the results of our evaluation. PATIENT BIOGRAPHICAL: Name: Anabela, Crayton Patient ID: [id number] Birth Date: July 08, 1946 Height:    59.0 in. Gender:     Female    [age]       Weight:    139.8 lbs. Ethnicity:  White                            Exam Date: 01/20/2024 FRAX* RESULTS:  (version: 3.5) 10-year Probability of Fracture1 Major Osteoporotic Fracture2 Hip Fracture 12.3% 2.6% Population: [place] (Caucasian) Risk Factors: None Based on Femur (Left) Neck BMD 1 -The 10-year probability of fracture may be lower than reported if the patient has received treatment. 2 -Major Osteoporotic Fracture: Clinical Spine, Forearm, Hip or Shoulder *FRAX is a Armed forces logistics/support/administrative officer of the [employer] of [employer] for Metabolic Bone Disease, a World Science writer (WHO) [employer]. ASSESSMENT: The probability of a major osteoporotic fracture is 12.3% within the next ten years. The probability of a hip fracture is 2.6% within the next ten years. . Electronically Signed   By: Romona Curls [name]   On: 01/20/2024 11:00    No results found.  No results found.    Assessment and Plan: Patient Active Problem List   Diagnosis Date Noted   URI (upper respiratory infection) 11/13/2023   Acute cough 11/02/2023   Seasonal allergies 10/11/2023   Neck pain 09/04/2023   Persistent cough 10/04/2022   Bronchitis 09/22/2022   Upper abdominal pain 08/28/2022   History of breast cancer 08/27/2022   Nausea 08/27/2022   Back skin lesion 04/18/2022   Has immunity to COVID-19 virus 09/18/2020   Headache 08/22/2020   Dizziness 08/22/2020   Stress 04/20/2020   Decreased GFR 03/11/2020   Leg pain 11/04/2019   Osteopenia 11/04/2018   Rectal bleeding 06/27/2018   Encounter for monitoring aromatase inhibitor therapy 05/31/2017   ANA positive  10/06/2016   De Quervain's tenosynovitis 10/06/2016   Drug toxicity 10/06/2016   Joint pain 06/08/2016   Sleeping difficulty 04/06/2015   SOB (shortness of breath) 02/02/2015   Health care maintenance 02/02/2015   Paraesophageal hiatal hernia 01/21/2015   Esophageal reflux 01/21/2015   Hernia of abdominal cavity 10/22/2014   Back pain 10/13/2014   Hyperglycemia 10/08/2014   Abdominal hernia 10/08/2014   Scoliosis 08/16/2014   Knee pain, right 07/09/2014   Obstructive sleep apnea 12/23/2013   Chest pain 10/01/2013   Hypertension 10/25/2012   GERD (gastroesophageal reflux disease) 10/25/2012   Hypercholesterolemia 10/25/2012   Allergic rhinitis 10/25/2012   1. OSA (obstructive sleep apnea) (Primary) PLAN OSA:   Patient evaluation reveals severe OSA controlled on cpap with an AHI of 2.3.  Patient having headaches. Will get overnight oximetry. If low, patient will require titration.  Patient is agreeable.   -  Pulse oximetry, overnight; Future  2. CPAP use counseling CPAP Counseling: had a lengthy discussion with the patient regarding the importance of PAP therapy in management of the sleep apnea. Patient appears to understand the risk factor reduction and also understands the risks associated with untreated sleep apnea. Patient will try to make a good faith effort to remain compliant with therapy. Also instructed the patient on proper cleaning of the device including the water must be changed daily if possible and use of distilled water is preferred. Patient understands that the machine should be regularly cleaned with appropriate recommended cleaning solutions that do not damage the PAP machine for example given white vinegar and water rinses. Other methods such as ozone treatment may not be as good as these simple methods to achieve cleaning.      General Counseling: I have discussed the findings of the evaluation and examination with Carla Ryan.  I have also discussed any further  diagnostic evaluation thatmay be needed or ordered today. Carla Ryan verbalizes understanding of the findings of todays visit. We also reviewed her medications today and discussed drug interactions and side effects including but not limited excessive drowsiness and altered mental states. We also discussed that there is always a risk not just to her but also people around her. she has been encouraged to call the office with any questions or concerns that should arise related to todays visit.  No orders of the defined types were placed in this encounter.       I have personally obtained a history, evaluated the patient, evaluated pertinent data, formulated the assessment and plan and placed orders.   This patient was seen today by Emmaline Kluver, PA-C in collaboration with Dr. Freda Munro.   Yevonne Pax, MD [facility] Diplomate ABMS Pulmonary and Critical Care Medicine Sleep medicine

## 2024-03-12 ENCOUNTER — Ambulatory Visit (INDEPENDENT_AMBULATORY_CARE_PROVIDER_SITE_OTHER): Payer: Self-pay | Admitting: Internal Medicine

## 2024-03-12 VITALS — BP 141/71 | HR 74 | Resp 16 | Ht 59.0 in | Wt 144.0 lb

## 2024-03-12 DIAGNOSIS — G4733 Obstructive sleep apnea (adult) (pediatric): Secondary | ICD-10-CM | POA: Insufficient documentation

## 2024-03-12 DIAGNOSIS — Z7189 Other specified counseling: Secondary | ICD-10-CM | POA: Insufficient documentation

## 2024-03-12 NOTE — Patient Instructions (Signed)

## 2024-03-19 DIAGNOSIS — R600 Localized edema: Secondary | ICD-10-CM | POA: Diagnosis not present

## 2024-03-19 DIAGNOSIS — E7800 Pure hypercholesterolemia, unspecified: Secondary | ICD-10-CM | POA: Diagnosis not present

## 2024-03-19 DIAGNOSIS — I351 Nonrheumatic aortic (valve) insufficiency: Secondary | ICD-10-CM | POA: Diagnosis not present

## 2024-03-19 DIAGNOSIS — I872 Venous insufficiency (chronic) (peripheral): Secondary | ICD-10-CM | POA: Diagnosis not present

## 2024-03-19 DIAGNOSIS — I10 Essential (primary) hypertension: Secondary | ICD-10-CM | POA: Diagnosis not present

## 2024-03-27 DIAGNOSIS — Z86018 Personal history of other benign neoplasm: Secondary | ICD-10-CM | POA: Diagnosis not present

## 2024-03-27 DIAGNOSIS — L570 Actinic keratosis: Secondary | ICD-10-CM | POA: Diagnosis not present

## 2024-03-27 DIAGNOSIS — L0390 Cellulitis, unspecified: Secondary | ICD-10-CM | POA: Diagnosis not present

## 2024-03-27 DIAGNOSIS — L578 Other skin changes due to chronic exposure to nonionizing radiation: Secondary | ICD-10-CM | POA: Diagnosis not present

## 2024-03-27 DIAGNOSIS — L821 Other seborrheic keratosis: Secondary | ICD-10-CM | POA: Diagnosis not present

## 2024-04-13 DIAGNOSIS — M5416 Radiculopathy, lumbar region: Secondary | ICD-10-CM | POA: Diagnosis not present

## 2024-04-13 DIAGNOSIS — M5126 Other intervertebral disc displacement, lumbar region: Secondary | ICD-10-CM | POA: Diagnosis not present

## 2024-04-13 DIAGNOSIS — M48062 Spinal stenosis, lumbar region with neurogenic claudication: Secondary | ICD-10-CM | POA: Diagnosis not present

## 2024-04-17 ENCOUNTER — Encounter (INDEPENDENT_AMBULATORY_CARE_PROVIDER_SITE_OTHER): Payer: Self-pay | Admitting: Vascular Surgery

## 2024-04-17 ENCOUNTER — Ambulatory Visit (INDEPENDENT_AMBULATORY_CARE_PROVIDER_SITE_OTHER): Admitting: Vascular Surgery

## 2024-04-17 VITALS — BP 134/58 | HR 68 | Resp 17 | Ht 59.0 in | Wt 146.6 lb

## 2024-04-17 DIAGNOSIS — E7800 Pure hypercholesterolemia, unspecified: Secondary | ICD-10-CM | POA: Diagnosis not present

## 2024-04-17 DIAGNOSIS — I10 Essential (primary) hypertension: Secondary | ICD-10-CM

## 2024-04-17 DIAGNOSIS — M7989 Other specified soft tissue disorders: Secondary | ICD-10-CM

## 2024-04-17 NOTE — Assessment & Plan Note (Signed)
 lipid control important in reducing the progression of atherosclerotic disease. Continue statin therapy

## 2024-04-17 NOTE — Assessment & Plan Note (Signed)

## 2024-04-17 NOTE — Assessment & Plan Note (Signed)
 blood pressure control important in reducing the progression of atherosclerotic disease. On appropriate oral medications.

## 2024-04-17 NOTE — Progress Notes (Signed)
 Patient ID: Carla Ryan, female   DOB: 1946/04/05, 78 y.o.   MRN: [id number]  Chief Complaint  Patient presents with   Establish Care    HPI Carla Ryan is a 78 y.o. female.  I am asked to see the patient by Barton Like for evaluation of leg swelling.  The patient reports noticeable leg swelling in the left leg even as a teenager and young adult.  This has been something that has been a persistent nuisance over the years although it really did not get that bothersome to her until a few years ago.  She has began to notice swelling in the right leg as well.  No chest pain or severe shortness of breath.  No previous history of DVT or superficial thrombophlebitis to her knowledge.  Denies any clear cause or inciting event.  Compression socks have not helped much in the past.  She does try to elevate her legs.   Past Medical History:  Diagnosis Date   Abdominal hernia    Anxiety    situational stress and anxiety   Cancer (HCC) 2015   breast    Cataract cortical, senile, unspecified laterality    Chest pain    Exertional dyspnea    GERD (gastroesophageal reflux disease)    Hiatal hernia    Hiatal hernia    Hypercholesterolemia    Hyperlipidemia    Hypertension    Osteoarthritis    scoliosis, degenerative disc dz, knee, carpal tunnel   PONV (postoperative nausea and vomiting)    Sleep apnea     Past Surgical History:  Procedure Laterality Date   ABDOMINAL HYSTERECTOMY  07/2000   APPENDECTOMY  2000   APPENDECTOMY     BREAST BIOPSY Bilateral 2014   BREAST SURGERY Right 2015   lumpectomy   CARPAL TUNNEL RELEASE     CATARACT EXTRACTION W/PHACO Right 02/16/2023   Procedure: CATARACT EXTRACTION PHACO AND INTRAOCULAR LENS PLACEMENT (IOC) RIGHT  7.20  00:39.2;  Surgeon: Annell Kidney, MD;  Location: [facility];  Service: Ophthalmology;  Laterality: Right;   CATARACT EXTRACTION W/PHACO Left 03/02/2023   Procedure: CATARACT EXTRACTION PHACO AND INTRAOCULAR LENS  PLACEMENT (IOC) LEFT  6.41  00:42.0;  Surgeon: Annell Kidney, MD;  Location: [facility];  Service: Ophthalmology;  Laterality: Left;   COLONOSCOPY  2011   [name], [name]: Normal exam   COLONOSCOPY WITH PROPOFOL  N/A 04/03/2020   Procedure: COLONOSCOPY WITH PROPOFOL ;  Surgeon: Toledo, Alphonsus Jeans, MD;  Location: [facility];  Service: Gastroenterology;  Laterality: N/A;   ESOPHAGOGASTRODUODENOSCOPY (EGD) WITH PROPOFOL  N/A 04/03/2020   Procedure: ESOPHAGOGASTRODUODENOSCOPY (EGD) WITH PROPOFOL ;  Surgeon: Toledo, Alphonsus Jeans, MD;  Location: [facility];  Service: Gastroenterology;  Laterality: N/A;   HERNIA REPAIR  02/10/15   Laparoscopic Repair of Lateral Abdominal Wall Hernia  02/10/15   MASTECTOMY     UPPER GI ENDOSCOPY  05/10/2014   [name], [name] Bile gastritis, medium hiatal hernia. Incomplete visualization of the cardia and fundus.     Family History  Problem Relation Age of Onset   Hypertension Mother    Diabetes Mother    Multiple myeloma Mother    Hypertension Father    Multiple myeloma Father    Diabetes Sister        x2   Diabetes Brother    Hypertension Brother    Dementia Brother      Social History   Tobacco Use   Smoking status: Never   Smokeless tobacco: Never  Vaping Use   Vaping status: Never Used  Substance Use Topics   Alcohol use: Not Currently    Comment: wine occasionally   Drug use: No     No Known Allergies  Current Outpatient Medications  Medication Sig Dispense Refill   Ascorbic Acid (VITAMIN C) 1000 MG tablet Take 1,000 mg by mouth daily.     aspirin 81 MG tablet Take 81 mg by mouth daily.     Azelastine  HCl 137 MCG/SPRAY SOLN INSTILL TWO SPRAYS INTO EACH NOSTIL TWICE A DAY AS DIRECTED. 120 mL 3   Biotin 5 MG CAPS Take by mouth daily.     calcium  citrate-vitamin D  500-400 MG-UNIT chewable tablet Chew 1 tablet by mouth 2 (two) times daily.     cholecalciferol (VITAMIN D ) 1000 UNITS tablet Take 2,000 Units by mouth  in the morning and at bedtime.      clindamycin (CLEOCIN T) 1 % external solution Apply topically.     Clobetasol Propionate (IMPOYZ) 0.025 % CREA      fluticasone  (FLONASE ) 50 MCG/ACT nasal spray USE 2 SPRAYS IN EACH NOSTRIL EVERY DAY 48 g 3   gabapentin  (NEURONTIN ) 100 MG capsule Take 2 capsules (200 mg total) by mouth at bedtime. 180 capsule 1   irbesartan  (AVAPRO ) 75 MG tablet TAKE 1 TABLET EVERY DAY 90 tablet 3   levocetirizine (XYZAL ) 5 MG tablet Take 1 tablet (5 mg total) by mouth every evening. 90 tablet 3   lidocaine  (LIDODERM ) 5 % Place 1 patch onto the skin as needed. Remove & Discard patch within 12 hours or as directed by MD     Magnesium Glycinate 100 MG CAPS Take 200 mg by mouth daily.     methocarbamol  (ROBAXIN ) 500 MG tablet Take 1 tablet (500 mg total) by mouth at bedtime as needed for muscle spasms. (Patient taking differently: Take 500 mg by mouth at bedtime as needed for muscle spasms. Pt taking prn) 30 tablet 0   Misc Natural Products (TART CHERRY ADVANCED) CAPS Take 1 capsule by mouth 2 (two) times daily. Reported on 03/04/2016     MULTIPLE MINERALS-VITAMINS PO      PEPPERMINT OIL PO Take by mouth.     PSYLLIUM FIBER PO      RABEprazole  (ACIPHEX ) 20 MG tablet Take 1 tablet (20 mg total) by mouth 2 (two) times daily. 180 tablet 1   RESTASIS 0.05 % ophthalmic emulsion Place 1 drop into both eyes 2 (two) times daily.     rosuvastatin  (CRESTOR ) 5 MG tablet Take 1 tablet (5 mg total) by mouth daily. 90 tablet 3   sucralfate  (CARAFATE ) 1 g tablet Take 1 tablet (1 g total) by mouth 2 (two) times daily. 180 tablet 1   triamterene -hydrochlorothiazide  (MAXZIDE -25) 37.5-25 MG tablet TAKE 1/2 TABLET EVERY DAY 45 tablet 3   No current facility-administered medications for this visit.      REVIEW OF SYSTEMS (Negative unless checked)  Constitutional: [] Weight loss  [] Fever  [] Chills Cardiac: [] Chest pain   [] Chest pressure   [] Palpitations   [] Shortness of breath when laying flat    [] Shortness of breath at rest   [x] Shortness of breath with exertion. Vascular:  [] Pain in legs with walking   [] Pain in legs at rest   [] Pain in legs when laying flat   [] Claudication   [] Pain in feet when walking  [] Pain in feet at rest  [] Pain in feet when laying flat   [] History of DVT   [] Phlebitis   [x] Swelling in  legs   [] Varicose veins   [] Non-healing ulcers Pulmonary:   [] Uses home oxygen   [] Productive cough   [] Hemoptysis   [] Wheeze  [] COPD   [] Asthma Neurologic:  [x] Dizziness  [] Blackouts   [] Seizures   [] History of stroke   [] History of TIA  [] Aphasia   [] Temporary blindness   [] Dysphagia   [] Weakness or numbness in arms   [] Weakness or numbness in legs Musculoskeletal:  [x] Arthritis   [] Joint swelling   [x] Joint pain   [] Low back pain Hematologic:  [] Easy bruising  [] Easy bleeding   [] Hypercoagulable state   [] Anemic  [] Hepatitis Gastrointestinal:  [] Blood in stool   [] Vomiting blood  [x] Gastroesophageal reflux/heartburn   [] Abdominal pain Genitourinary:  [x] Chronic kidney disease   [] Difficult urination  [] Frequent urination  [] Burning with urination   [] Hematuria Skin:  [] Rashes   [] Ulcers   [] Wounds Psychological:  [] History of anxiety   []  History of major depression.    Physical Exam BP (!) 134/58 (BP Location: Left Arm, Patient Position: Sitting, Cuff Size: Normal)   Pulse 68   Resp 17   Ht 4\' 11"  (1.499 m)   Wt 146 lb 9.6 oz (66.5 kg)   BMI 29.61 kg/m  Gen:  WD/WN, NAD. Appears younger than stated age. Head: [place]/AT, No temporalis wasting.  Ear/Nose/Throat: Hearing grossly intact, nares w/o erythema or drainage, oropharynx w/o Erythema/Exudate Eyes: Conjunctiva clear, sclera non-icteric  Neck: trachea midline.  No JVD.  Pulmonary:  Good air movement, respirations not labored, no use of accessory muscles  Cardiac: RRR, no JVD Vascular:  Vessel Right Left  Radial Palpable Palpable                                   Gastrointestinal:. No masses, surgical  incisions, or scars. Musculoskeletal: M/S 5/5 throughout.  Extremities without ischemic changes.  No deformity or atrophy. Trace RLE edema, 1+ LLE edema. Neurologic: Sensation grossly intact in extremities.  Symmetrical.  Speech is fluent. Motor exam as listed above. Psychiatric: Judgment intact, Mood & affect appropriate for pt's clinical situation. Dermatologic: No rashes or ulcers noted.  No cellulitis or open wounds.    Radiology No results found.  Labs No results found for this or any previous visit (from the past 2160 hours).  Assessment/Plan:  Swelling of limb Recommend:  I have had a long discussion with the patient regarding swelling and why it  causes symptoms.  Patient will begin wearing graduated compression on a daily basis a prescription was given. The patient will  wear the stockings first thing in the morning and removing them in the evening. The patient is instructed specifically not to sleep in the stockings.   In addition, behavioral modification will be initiated.  This will include frequent elevation, use of over the counter pain medications and exercise such as walking.  Consideration for a lymph pump will also be made based upon the effectiveness of conservative therapy.  This would help to improve the edema control and prevent sequela such as ulcers and infections   Patient should undergo duplex ultrasound of the venous system to ensure that DVT or reflux is not present.  The patient will follow-up with me after the ultrasound.   Hypercholesterolemia lipid control important in reducing the progression of atherosclerotic disease. Continue statin therapy   Hypertension blood pressure control important in reducing the progression of atherosclerotic disease. On appropriate oral medications.      Carla Ryan  04/17/2024, 10:18 AM   This note was created with Dragon medical transcription system.  Any errors from dictation are unintentional.

## 2024-04-17 NOTE — Patient Instructions (Signed)
Edema  Edema is an abnormal buildup of fluids in the body tissues and under the skin. Swelling of the legs, feet, and ankles is a common symptom that becomes more likely as you get older. Swelling is also common in looser tissues, such as around the eyes. Pressing on the area may make a temporary dent in your skin (pitting edema). This fluid may also accumulate in your lungs (pulmonary edema). There are many possible causes of edema. Eating too much salt (sodium) and being on your feet or sitting for a long time can cause edema in your legs, feet, and ankles. Common causes of edema include: Certain medical conditions, such as heart failure, liver or kidney disease, and cancer. Weak leg blood vessels. An injury. Pregnancy. Medicines. Being obese. Low protein levels in the blood. Hot weather may make edema worse. Edema is usually painless. Your skin may look swollen or shiny. Follow these instructions at home: Medicines Take over-the-counter and prescription medicines only as told by your health care provider. Your health care provider may prescribe a medicine to help your body get rid of extra water (diuretic). Take this medicine if you are told to take it. Eating and drinking Eat a low-salt (low-sodium) diet to reduce fluid as told by your health care provider. Sometimes, eating less salt may reduce swelling. Depending on the cause of your swelling, you may need to limit how much fluid you drink (fluid restriction). General instructions Raise (elevate) the injured area above the level of your heart while you are sitting or lying down. Do not sit still or stand for long periods of time. Do not wear tight clothing. Do not wear garters on your upper legs. Exercise your legs to get your circulation going. This helps to move the fluid back into your blood vessels, and it may help the swelling go down. Wear compression stockings as told by your health care provider. These stockings help to prevent  blood clots and reduce swelling in your legs. It is important that these are the correct size. These stockings should be prescribed by your health care provider to prevent possible injuries. If elastic bandages or wraps are recommended, use them as told by your health care provider. Contact a health care provider if: Your edema does not get better with treatment. You have heart, liver, or kidney disease and have symptoms of edema. You have sudden and unexplained weight gain. Get help right away if: You develop shortness of breath or chest pain. You cannot breathe when you lie down. You develop pain, redness, or warmth in the swollen areas. You have heart, liver, or kidney disease and suddenly get edema. You have a fever and your symptoms suddenly get worse. These symptoms may be an emergency. Get help right away. Call 911. Do not wait to see if the symptoms will go away. Do not drive yourself to the hospital. Summary Edema is an abnormal buildup of fluids in the body tissues and under the skin. Eating too much salt (sodium)and being on your feet or sitting for a long time can cause edema in your legs, feet, and ankles. Raise (elevate) the injured area above the level of your heart while you are sitting or lying down. Follow your health care provider's instructions about diet and how much fluid you can drink. This information is not intended to replace advice given to you by your health care provider. Make sure you discuss any questions you have with your health care provider. Document Revised: 08/10/2021 Document  Reviewed: 08/10/2021 Elsevier Patient Education  2024 [employer].

## 2024-04-18 ENCOUNTER — Other Ambulatory Visit: Payer: Self-pay | Admitting: Internal Medicine

## 2024-04-21 DIAGNOSIS — R0902 Hypoxemia: Secondary | ICD-10-CM | POA: Diagnosis not present

## 2024-05-01 ENCOUNTER — Other Ambulatory Visit: Payer: Medicare HMO

## 2024-05-03 ENCOUNTER — Other Ambulatory Visit: Payer: Self-pay

## 2024-05-03 DIAGNOSIS — I10 Essential (primary) hypertension: Secondary | ICD-10-CM

## 2024-05-03 DIAGNOSIS — E7800 Pure hypercholesterolemia, unspecified: Secondary | ICD-10-CM

## 2024-05-03 DIAGNOSIS — R944 Abnormal results of kidney function studies: Secondary | ICD-10-CM

## 2024-05-03 DIAGNOSIS — R739 Hyperglycemia, unspecified: Secondary | ICD-10-CM

## 2024-05-04 ENCOUNTER — Other Ambulatory Visit (INDEPENDENT_AMBULATORY_CARE_PROVIDER_SITE_OTHER)

## 2024-05-04 DIAGNOSIS — R739 Hyperglycemia, unspecified: Secondary | ICD-10-CM | POA: Diagnosis not present

## 2024-05-04 DIAGNOSIS — I10 Essential (primary) hypertension: Secondary | ICD-10-CM

## 2024-05-04 DIAGNOSIS — E7800 Pure hypercholesterolemia, unspecified: Secondary | ICD-10-CM

## 2024-05-04 DIAGNOSIS — R944 Abnormal results of kidney function studies: Secondary | ICD-10-CM

## 2024-05-04 LAB — LIPID PANEL
Cholesterol: 167 mg/dL (ref 0–200)
HDL: 74.2 mg/dL (ref >39.00)
LDL Cholesterol: 75 mg/dL (ref 0–99)
NonHDL: 92.94
Total CHOL/HDL Ratio: 2
Triglycerides: 90 mg/dL (ref 0.0–149.0)
VLDL: 18 mg/dL (ref 0.0–40.0)

## 2024-05-04 LAB — BASIC METABOLIC PANEL WITH GFR
BUN: 19 mg/dL (ref 6–23)
CO2: 30 meq/L (ref 19–32)
Calcium: 9.5 mg/dL (ref 8.4–10.5)
Chloride: 102 meq/L (ref 96–112)
Creatinine, Ser: 1.01 mg/dL (ref 0.40–1.20)
GFR: 52.67 mL/min — ABNORMAL LOW (ref >60.00)
Glucose, Bld: 96 mg/dL (ref 70–99)
Potassium: 4 meq/L (ref 3.5–5.1)
Sodium: 138 meq/L (ref 135–145)

## 2024-05-04 LAB — HEPATIC FUNCTION PANEL
ALT: 17 U/L (ref 0–35)
AST: 21 U/L (ref 0–37)
Albumin: 4.1 g/dL (ref 3.5–5.2)
Alkaline Phosphatase: 46 U/L (ref 39–117)
Bilirubin, Direct: 0.1 mg/dL (ref 0.0–0.3)
Total Bilirubin: 0.5 mg/dL (ref 0.2–1.2)
Total Protein: 6.5 g/dL (ref 6.0–8.3)

## 2024-05-04 LAB — HEMOGLOBIN A1C: Hgb A1c MFr Bld: 6.2 % (ref 4.6–6.5)

## 2024-05-04 NOTE — Addendum Note (Signed)
 Addended by: Thressa Flora D on: 05/04/2024 10:18 AM   Modules accepted: Orders

## 2024-05-08 ENCOUNTER — Ambulatory Visit: Payer: Medicare HMO | Admitting: Internal Medicine

## 2024-05-08 ENCOUNTER — Encounter (INDEPENDENT_AMBULATORY_CARE_PROVIDER_SITE_OTHER): Payer: Self-pay

## 2024-05-08 LAB — TSH: TSH: 2.12 u[IU]/mL (ref 0.35–5.50)

## 2024-05-09 ENCOUNTER — Ambulatory Visit: Payer: Self-pay | Admitting: Internal Medicine

## 2024-05-10 DIAGNOSIS — M5126 Other intervertebral disc displacement, lumbar region: Secondary | ICD-10-CM | POA: Diagnosis not present

## 2024-05-10 DIAGNOSIS — M5412 Radiculopathy, cervical region: Secondary | ICD-10-CM | POA: Diagnosis not present

## 2024-05-10 DIAGNOSIS — M5416 Radiculopathy, lumbar region: Secondary | ICD-10-CM | POA: Diagnosis not present

## 2024-05-10 DIAGNOSIS — M48062 Spinal stenosis, lumbar region with neurogenic claudication: Secondary | ICD-10-CM | POA: Diagnosis not present

## 2024-05-10 DIAGNOSIS — M4802 Spinal stenosis, cervical region: Secondary | ICD-10-CM | POA: Diagnosis not present

## 2024-05-15 ENCOUNTER — Ambulatory Visit (INDEPENDENT_AMBULATORY_CARE_PROVIDER_SITE_OTHER): Admitting: Internal Medicine

## 2024-05-15 ENCOUNTER — Ambulatory Visit: Admitting: Internal Medicine

## 2024-05-15 VITALS — BP 130/72 | HR 67 | Temp 98.0°F | Resp 16 | Ht 59.0 in | Wt 144.0 lb

## 2024-05-15 DIAGNOSIS — I10 Essential (primary) hypertension: Secondary | ICD-10-CM

## 2024-05-15 DIAGNOSIS — K449 Diaphragmatic hernia without obstruction or gangrene: Secondary | ICD-10-CM

## 2024-05-15 DIAGNOSIS — E7800 Pure hypercholesterolemia, unspecified: Secondary | ICD-10-CM | POA: Diagnosis not present

## 2024-05-15 DIAGNOSIS — Z853 Personal history of malignant neoplasm of breast: Secondary | ICD-10-CM | POA: Diagnosis not present

## 2024-05-15 DIAGNOSIS — L989 Disorder of the skin and subcutaneous tissue, unspecified: Secondary | ICD-10-CM | POA: Diagnosis not present

## 2024-05-15 DIAGNOSIS — R739 Hyperglycemia, unspecified: Secondary | ICD-10-CM

## 2024-05-15 DIAGNOSIS — G4733 Obstructive sleep apnea (adult) (pediatric): Secondary | ICD-10-CM

## 2024-05-15 DIAGNOSIS — K219 Gastro-esophageal reflux disease without esophagitis: Secondary | ICD-10-CM

## 2024-05-15 NOTE — Progress Notes (Signed)
 Subjective:    Patient ID: Carla Ryan, female    DOB: 1946-02-03, 78 y.o.   MRN: [id number]  Patient here for  Chief Complaint  Patient presents with   Medical Management of Chronic Issues    HPI Here for a scheduled follow up - f/u regarding hypertension and hypercholesterolemia. Using cpap. Seeing Dr Shane Darling - f/u neck and back pain. S/p ESI - L3-L4 09/2023. S/p injection in her hip - good results. Had f/u 2/2-25 - continue tylenol , gabapentin , PT, and ESI. S/p ESI 04/13/24. Was evaluated 02/24/23 - oncology - [facility]. 9 years out - from breast cancer diagnosis. Off anti hormone therapy for one year. Graduated from the [facility] to survivorship program. Had f/u with pulmonary 03/12/24 - f/u OSA. Continue cpap. Discussed chin strap. Recommended overnight oximetry. Follow up with cardiology 03/19/24 - stable. Recommended referral to [facility] for venous insufficiency. Saw [facility] 04/17/24 - recommended graduated compression stockings and consideration for lymphedema pump. Has f/u planned next week. Left ringer finger - surrounding nail - saw dermatology - using cleocin solution and took oral  clindamycin. Has improved.   Past Medical History:  Diagnosis Date   Abdominal hernia    Anxiety    situational stress and anxiety   Cancer (HCC) 2015   breast    Cataract cortical, senile, unspecified laterality    Chest pain    Exertional dyspnea    GERD (gastroesophageal reflux disease)    Hiatal hernia    Hiatal hernia    Hypercholesterolemia    Hyperlipidemia    Hypertension    Osteoarthritis    scoliosis, degenerative disc dz, knee, carpal tunnel   PONV (postoperative nausea and vomiting)    Sleep apnea    Past Surgical History:  Procedure Laterality Date   ABDOMINAL HYSTERECTOMY  07/2000   APPENDECTOMY  2000   APPENDECTOMY     BREAST BIOPSY Bilateral 2014   BREAST SURGERY Right 2015   lumpectomy   CARPAL TUNNEL RELEASE     CATARACT EXTRACTION W/PHACO Right  02/16/2023   Procedure: CATARACT EXTRACTION PHACO AND INTRAOCULAR LENS PLACEMENT (IOC) RIGHT  7.20  00:39.2;  Surgeon: Annell Kidney, MD;  Location: [facility];  Service: Ophthalmology;  Laterality: Right;   CATARACT EXTRACTION W/PHACO Left 03/02/2023   Procedure: CATARACT EXTRACTION PHACO AND INTRAOCULAR LENS PLACEMENT (IOC) LEFT  6.41  00:42.0;  Surgeon: Annell Kidney, MD;  Location: [facility];  Service: Ophthalmology;  Laterality: Left;   COLONOSCOPY  2011   [name], [name]: Normal exam   COLONOSCOPY WITH PROPOFOL  N/A 04/03/2020   Procedure: COLONOSCOPY WITH PROPOFOL ;  Surgeon: Toledo, Alphonsus Jeans, MD;  Location: [facility];  Service: Gastroenterology;  Laterality: N/A;   ESOPHAGOGASTRODUODENOSCOPY (EGD) WITH PROPOFOL  N/A 04/03/2020   Procedure: ESOPHAGOGASTRODUODENOSCOPY (EGD) WITH PROPOFOL ;  Surgeon: Toledo, Alphonsus Jeans, MD;  Location: [facility];  Service: Gastroenterology;  Laterality: N/A;   HERNIA REPAIR  02/10/15   Laparoscopic Repair of Lateral Abdominal Wall Hernia  02/10/15   MASTECTOMY     UPPER GI ENDOSCOPY  05/10/2014   [name], [name] Bile gastritis, medium hiatal hernia. Incomplete visualization of the cardia and fundus.   Family History  Problem Relation Age of Onset   Hypertension Mother    Diabetes Mother    Multiple myeloma Mother    Hypertension Father    Multiple myeloma Father    Diabetes Sister        x2   Diabetes Brother    Hypertension Brother  Dementia Brother    Social History   Socioeconomic History   Marital status: Married    Spouse name: Sinclair Due   Number of children: 2   Years of education: Not on file   Highest education level: 12th grade  Occupational History   Occupation: retired  Tobacco Use   Smoking status: Never   Smokeless tobacco: Never  Vaping Use   Vaping status: Never Used  Substance and Sexual Activity   Alcohol use: Not Currently    Comment: wine occasionally   Drug use: No    Sexual activity: Yes  Other Topics Concern   Not on file  Social History Narrative   Not on file   Social Drivers of Health   Financial Resource Strain: Low Risk  (05/15/2024)   Overall Financial Resource Strain (CARDIA)    Difficulty of Paying Living Expenses: Not hard at all  Food Insecurity: No Food Insecurity (05/15/2024)   Hunger Vital Sign    Worried About Running Out of Food in the Last Year: Never true    Ran Out of Food in the Last Year: Never true  Transportation Needs: No Transportation Needs (05/15/2024)   PRAPARE - Administrator, Civil Service (Medical): No    Lack of Transportation (Non-Medical): No  Physical Activity: Insufficiently Active (10/12/2023)   Exercise Vital Sign    Days of Exercise per Week: 3 days    Minutes of Exercise per Session: 10 min  Stress: No Stress Concern Present (05/15/2024)   [employer] of Occupational Health - Occupational Stress Questionnaire    Feeling of Stress : Only a little  Social Connections: Socially Integrated (05/15/2024)   Social Connection and Isolation Panel [NHANES]    Frequency of Communication with Friends and Family: Three times a week    Frequency of Social Gatherings with Friends and Family: Three times a week    Attends Religious Services: More than 4 times per year    Active Member of Clubs or Organizations: Yes    Attends Banker Meetings: More than 4 times per year    Marital Status: Married     Review of Systems  Constitutional:  Negative for appetite change, fever and unexpected weight change.  HENT:  Negative for congestion and sinus pressure.   Respiratory:  Negative for cough, chest tightness and shortness of breath.   Cardiovascular:  Negative for chest pain, palpitations and leg swelling.  Gastrointestinal:  Negative for abdominal pain, diarrhea, nausea and vomiting.  Genitourinary:  Negative for difficulty urinating and dysuria.  Musculoskeletal:  Negative for joint  swelling and myalgias.  Skin:  Negative for rash.  Neurological:  Negative for dizziness and headaches.  Psychiatric/Behavioral:  Negative for agitation and dysphoric mood.        Objective:     BP 130/72   Pulse 67   Temp 98 F (36.7 C)   Resp 16   Ht 4\' 11"  (1.499 m)   Wt 144 lb (65.3 kg)   SpO2 98%   BMI 29.08 kg/m  Wt Readings from Last 3 Encounters:  05/15/24 144 lb (65.3 kg)  04/17/24 146 lb 9.6 oz (66.5 kg)  03/12/24 144 lb (65.3 kg)    Physical Exam Vitals reviewed.  Constitutional:      General: She is not in acute distress.    Appearance: Normal appearance.  HENT:     Head: Normocephalic and atraumatic.     Right Ear: External ear normal.     Left  Ear: External ear normal.     Mouth/Throat:     Pharynx: No oropharyngeal exudate or posterior oropharyngeal erythema.  Eyes:     General: No scleral icterus.       Right eye: No discharge.        Left eye: No discharge.     Conjunctiva/sclera: Conjunctivae normal.  Neck:     Thyroid : No thyromegaly.  Cardiovascular:     Rate and Rhythm: Normal rate and regular rhythm.  Pulmonary:     Effort: No respiratory distress.     Breath sounds: Normal breath sounds. No wheezing.  Abdominal:     General: Bowel sounds are normal.     Palpations: Abdomen is soft.     Tenderness: There is no abdominal tenderness.  Musculoskeletal:        General: No swelling or tenderness.     Cervical back: Neck supple. No tenderness.  Lymphadenopathy:     Cervical: No cervical adenopathy.  Skin:    Findings: No rash.  Neurological:     Mental Status: She is alert.  Psychiatric:        Mood and Affect: Mood normal.        Behavior: Behavior normal.         Outpatient Encounter Medications as of 05/15/2024  Medication Sig   Ascorbic Acid (VITAMIN C) 1000 MG tablet Take 1,000 mg by mouth daily.   aspirin 81 MG tablet Take 81 mg by mouth daily.   Azelastine  HCl 137 MCG/SPRAY SOLN INSTILL TWO SPRAYS INTO EACH NOSTIL TWICE  A DAY AS DIRECTED.   Biotin 5 MG CAPS Take by mouth daily.   calcium  citrate-vitamin D  500-400 MG-UNIT chewable tablet Chew 1 tablet by mouth 2 (two) times daily.   cholecalciferol (VITAMIN D ) 1000 UNITS tablet Take 2,000 Units by mouth in the morning and at bedtime.    clindamycin (CLEOCIN T) 1 % external solution Apply topically.   Clobetasol Propionate (IMPOYZ) 0.025 % CREA    fluticasone  (FLONASE ) 50 MCG/ACT nasal spray USE 2 SPRAYS IN EACH NOSTRIL EVERY DAY   gabapentin  (NEURONTIN ) 100 MG capsule Take 2 capsules (200 mg total) by mouth at bedtime.   irbesartan  (AVAPRO ) 75 MG tablet TAKE 1 TABLET EVERY DAY   levocetirizine (XYZAL ) 5 MG tablet Take 1 tablet (5 mg total) by mouth every evening.   lidocaine  (LIDODERM ) 5 % Place 1 patch onto the skin as needed. Remove & Discard patch within 12 hours or as directed by MD   Magnesium Glycinate 100 MG CAPS Take 200 mg by mouth daily.   methocarbamol  (ROBAXIN ) 500 MG tablet Take 1 tablet (500 mg total) by mouth at bedtime as needed for muscle spasms. (Patient taking differently: Take 500 mg by mouth at bedtime as needed for muscle spasms. Pt taking prn)   Misc Natural Products (TART CHERRY ADVANCED) CAPS Take 1 capsule by mouth 2 (two) times daily. Reported on 03/04/2016   MULTIPLE MINERALS-VITAMINS PO    PEPPERMINT OIL PO Take by mouth.   PSYLLIUM FIBER PO    RABEprazole  (ACIPHEX ) 20 MG tablet Take 1 tablet (20 mg total) by mouth 2 (two) times daily.   RESTASIS 0.05 % ophthalmic emulsion Place 1 drop into both eyes 2 (two) times daily.   rosuvastatin  (CRESTOR ) 5 MG tablet TAKE 1 TABLET EVERY DAY   sucralfate  (CARAFATE ) 1 g tablet Take 1 tablet (1 g total) by mouth 2 (two) times daily.   triamterene -hydrochlorothiazide  (MAXZIDE -25) 37.5-25 MG tablet TAKE 1/2 TABLET EVERY  DAY   No facility-administered encounter medications on file as of 05/15/2024.     Lab Results  Component Value Date   WBC 6.1 09/14/2023   HGB 13.1 09/14/2023   HCT 40.1  09/14/2023   PLT 217.0 09/14/2023   GLUCOSE 96 05/04/2024   CHOL 167 05/04/2024   TRIG 90.0 05/04/2024   HDL 74.20 05/04/2024   LDLDIRECT 146.9 01/31/2014   LDLCALC 75 05/04/2024   ALT 17 05/04/2024   AST 21 05/04/2024   NA 138 05/04/2024   K 4.0 05/04/2024   CL 102 05/04/2024   CREATININE 1.01 05/04/2024   BUN 19 05/04/2024   CO2 30 05/04/2024   TSH 2.12 05/04/2024   HGBA1C 6.2 05/04/2024    DG Bone Density Result Date: 01/20/2024 EXAM: DUAL X-RAY ABSORPTIOMETRY (DXA) FOR BONE MINERAL DENSITY IMPRESSION: Your patient Analiz Tvedt completed a BMD test on 01/20/2024 using the [employer] iDXA DXA System (software version: 14.10) manufactured by [employer]. The following summarizes the results of our evaluation. Technologist: SCE PATIENT BIOGRAPHICAL: Name: Rilei, Kravitz Patient ID: [id number] Birth Date: 07-02-1946 Height: 59.0 in. Gender: Female Exam Date: 01/20/2024 Weight: 139.8 lbs. Indications: Advanced Age, Caucasian, Height Loss, History of Breast Cancer, History of Radiation, Hysterectomy, Oophorectomy Bilateral, Osteoarthritis, Postmenopausal Fractures: Treatments: Calcium , Vitamin D  DENSITOMETRY RESULTS: Site         Region     Measured Date Measured Age WHO Classification Young Adult T-score BMD         %Change vs. Previous Significant Change (*) DualFemur Neck Left 01/20/20[age] Oste[age] -1.2 0.869 g/cm2 - - Left Forearm Radius 33% 01/20/2024 79.9 Osteopenia -1.6 0.739 g/cm2 - - ASSESSMENT: The BMD measured at Forearm Radius 33% is 0.739 g/cm2 with a T-score of -1.6. This patient is considered osteopenic according to World Health Organization [place]) criteria. The scan quality is good. Lumbar spine was not utilized due to advanced degenerative changes. World Science writer [facility]) criteria for post-menopausal, Caucasian Women: Normal:                   T-score at or above -1 SD Osteopenia/low bone mass: T-score between -1 and -2.5 SD Osteoporosis:             T-score  at or below -2.5 SD RECOMMENDATIONS: 1. All patients should optimize calcium  and vitamin D  intake. 2. Consider FDA-approved medical therapies in postmenopausal women and men aged 26 years and older, based on the following: a. A hip or vertebral(clinical or morphometric) fracture b. T-score < -2.5 at the femoral neck or spine after appropriate evaluation to exclude secondary causes c. Low bone mass (T-score between -1.0 and -2.5 at the femoral neck or spine) and a 10-year probability of a hip fracture > 3% or a 10-year probability of a major osteoporosis-related fracture > 20% based on the [place] -adapted WHO algorithm 3. Clinician judgment and/or patient preferences may indicate treatment for people with 10-year fracture probabilities above or below these levels FOLLOW-UP: People with diagnosed cases of osteoporosis or at high risk for fracture should have regular bone mineral density tests. For patients eligible for Medicare, routine testing is allowed once every 2 years. The testing frequency can be increased to one year for patients who have rapidly progressing disease, those who are receiving or discontinuing medical therapy to restore bone mass, or have additional risk factors. I have reviewed this report, and agree with the above findings. [facility] Dear Dellar Fenton, Your patient ANNAMARY BUSCHMAN completed a FRAX  assessment on 01/20/2024 using the [employer] iDXA DXA System (analysis version: 14.10) manufactured by [employer]. The following summarizes the results of our evaluation. PATIENT BIOGRAPHICAL: Name: Samera, Macy Patient ID: [id number] Birth Date: Sep 30, 1946 Height:    59.0 in. Gender:     [age]    [age]       Weight:    139.8 lbs. Ethnicity:  White                            Exam Date: 01/20/2024 FRAX* RESULTS:  (version: 3.5) 10-year Probability of Fracture1 Major Osteoporotic Fracture2 Hip Fracture 12.3% 2.6% Population: [place]  (Caucasian) Risk Factors: None Based on Femur  (Left) Neck BMD 1 -The 10-year probability of fracture may be lower than reported if the patient has received treatment. 2 -Major Osteoporotic Fracture: Clinical Spine, Forearm, Hip or Shoulder *FRAX is a Armed forces logistics/support/administrative officer of the [employer] of [employer] for Metabolic Bone Disease, a World Science writer (WHO) [employer]. ASSESSMENT: The probability of a major osteoporotic fracture is 12.3% within the next ten years. The probability of a hip fracture is 2.6% within the next ten years. . Electronically Signed   By: Tita Form [name]   On: 01/20/2024 11:00       Assessment & Plan:  Gastroesophageal reflux disease without esophagitis Assessment & Plan: Has a large paraesophageal hernia.  On aciphex  and carafate .  Has seen GI.  Evaluated by Dr Mckinley Spells.  Elected watchful waiting. No increased symptoms reported.    Hyperglycemia Assessment & Plan: Low carb diet and exercise. Follow met b and A1c.   Orders: -     Hemoglobin A1c; Future  Hypercholesterolemia Assessment & Plan: Continue crestor .  Low cholesterol diet and exercise.  Follow lipid panel and liver function tests.   Lab Results  Component Value Date   CHOL 167 05/04/2024   HDL 74.20 05/04/2024   LDLCALC 75 05/04/2024   LDLDIRECT 146.9 01/31/2014   TRIG 90.0 05/04/2024   CHOLHDL 2 05/04/2024     Orders: -     Lipid panel; Future -     Basic metabolic panel with GFR; Future -     Hepatic function panel; Future  Primary hypertension Assessment & Plan: Continue avapro  and triam/hctz.  Blood pressures as outlined.  No changes in medication today. Follow pressures.   Orders: -     CBC with Differential/Platelet; Future  History of breast cancer Assessment & Plan: Mammogram 02/24/23- Birads II.  Was evaluated 02/24/23 - oncology - [facility]. 9 years out - from breast cancer diagnosis. Off anti hormone therapy for one year. Graduated from the [facility] to survivorship program.   Mammogram 03/01/24 - Birads II.    Paraesophageal hiatal hernia Assessment & Plan: Saw Mckinley Spells.  Elected watchful waiting.     Obstructive sleep apnea Assessment & Plan: Continue cpap.    Finger lesion Assessment & Plan: Saw Dr Tyrone Gallop. Using cleocin solution. Took oral clindamycin. Improved. Continue f/u with Dr Tyrone Gallop.       Dellar Fenton, MD

## 2024-05-17 ENCOUNTER — Ambulatory Visit: Admitting: Internal Medicine

## 2024-05-20 ENCOUNTER — Encounter: Payer: Self-pay | Admitting: Internal Medicine

## 2024-05-20 DIAGNOSIS — L989 Disorder of the skin and subcutaneous tissue, unspecified: Secondary | ICD-10-CM | POA: Insufficient documentation

## 2024-05-20 NOTE — Assessment & Plan Note (Signed)
Saw Tim Farrell.  Elected watchful waiting.   

## 2024-05-20 NOTE — Assessment & Plan Note (Signed)
 Mammogram 02/24/23- Birads II.  Was evaluated 02/24/23 - oncology - [facility]. 9 years out - from breast cancer diagnosis. Off anti hormone therapy for one year. Graduated from the [facility] to survivorship program.  Mammogram 03/01/24 - Birads II.

## 2024-05-20 NOTE — Assessment & Plan Note (Signed)
 Continue crestor .  Low cholesterol diet and exercise.  Follow lipid panel and liver function tests.   Lab Results  Component Value Date   CHOL 167 05/04/2024   HDL 74.20 05/04/2024   LDLCALC 75 05/04/2024   LDLDIRECT 146.9 01/31/2014   TRIG 90.0 05/04/2024   CHOLHDL 2 05/04/2024

## 2024-05-20 NOTE — Assessment & Plan Note (Signed)
 Has a large paraesophageal hernia.  On aciphex  and carafate .  Has seen GI.  Evaluated by Dr Mckinley Spells.  Elected watchful waiting. No increased symptoms reported.

## 2024-05-20 NOTE — Assessment & Plan Note (Signed)
 Low-carb diet and exercise.  Follow met b and A1c.

## 2024-05-20 NOTE — Assessment & Plan Note (Signed)
 Continue avapro  and triam/hctz.  Blood pressures as outlined.  No changes in medication today. Follow pressures.

## 2024-05-20 NOTE — Assessment & Plan Note (Signed)
 Continue cpap.

## 2024-05-20 NOTE — Assessment & Plan Note (Signed)
 Saw Dr Tyrone Gallop. Using cleocin solution. Took oral clindamycin. Improved. Continue f/u with Dr Tyrone Gallop.

## 2024-05-22 ENCOUNTER — Ambulatory Visit (INDEPENDENT_AMBULATORY_CARE_PROVIDER_SITE_OTHER): Admitting: Vascular Surgery

## 2024-05-22 ENCOUNTER — Ambulatory Visit (INDEPENDENT_AMBULATORY_CARE_PROVIDER_SITE_OTHER)

## 2024-05-22 VITALS — BP 145/74 | HR 83 | Resp 16 | Ht 59.0 in | Wt 144.0 lb

## 2024-05-22 DIAGNOSIS — I10 Essential (primary) hypertension: Secondary | ICD-10-CM

## 2024-05-22 DIAGNOSIS — I890 Lymphedema, not elsewhere classified: Secondary | ICD-10-CM | POA: Diagnosis not present

## 2024-05-22 DIAGNOSIS — E7800 Pure hypercholesterolemia, unspecified: Secondary | ICD-10-CM

## 2024-05-22 DIAGNOSIS — M7989 Other specified soft tissue disorders: Secondary | ICD-10-CM

## 2024-05-22 NOTE — Progress Notes (Signed)
 MRN : [id number]  Carla Ryan is a 78 y.o. ([birth year]) female who presents with chief complaint of  Chief Complaint  Patient presents with   Follow-up    Bilateral reflux  .  History of Present Illness: Patient returns today in follow up of her leg swelling.  Continued use of compression socks, elevation, and exercise have not resulted in significant improvement in her lower extremity swelling.  No new ulceration or infection.  No fevers or chills.  No chest pain or shortness of breath. Her venous reflux study today demonstrated no DVT, superficial thrombophlebitis, or significant superficial reflux in either lower extremity.  There is only a small segment of reflux in the left common femoral vein.  Current Outpatient Medications  Medication Sig Dispense Refill   Ascorbic Acid (VITAMIN C) 1000 MG tablet Take 1,000 mg by mouth daily.     aspirin 81 MG tablet Take 81 mg by mouth daily.     Azelastine  HCl 137 MCG/SPRAY SOLN INSTILL TWO SPRAYS INTO EACH NOSTIL TWICE A DAY AS DIRECTED. 120 mL 3   Biotin 5 MG CAPS Take by mouth daily.     calcium  citrate-vitamin D  500-400 MG-UNIT chewable tablet Chew 1 tablet by mouth 2 (two) times daily.     cholecalciferol (VITAMIN D ) 1000 UNITS tablet Take 2,000 Units by mouth in the morning and at bedtime.      clindamycin (CLEOCIN T) 1 % external solution Apply topically.     Clobetasol Propionate (IMPOYZ) 0.025 % CREA      fluticasone  (FLONASE ) 50 MCG/ACT nasal spray USE 2 SPRAYS IN EACH NOSTRIL EVERY DAY 48 g 3   gabapentin  (NEURONTIN ) 100 MG capsule Take 2 capsules (200 mg total) by mouth at bedtime. 180 capsule 1   irbesartan  (AVAPRO ) 75 MG tablet TAKE 1 TABLET EVERY DAY 90 tablet 3   levocetirizine (XYZAL ) 5 MG tablet Take 1 tablet (5 mg total) by mouth every evening. 90 tablet 3   lidocaine  (LIDODERM ) 5 % Place 1 patch onto the skin as needed. Remove & Discard patch within 12 hours or as directed by MD     Magnesium Glycinate 100 MG CAPS Take  200 mg by mouth daily.     methocarbamol  (ROBAXIN ) 500 MG tablet Take 1 tablet (500 mg total) by mouth at bedtime as needed for muscle spasms. (Patient taking differently: Take 500 mg by mouth at bedtime as needed for muscle spasms. Pt taking prn) 30 tablet 0   Misc Natural Products (TART CHERRY ADVANCED) CAPS Take 1 capsule by mouth 2 (two) times daily. Reported on 03/04/2016     MULTIPLE MINERALS-VITAMINS PO      PEPPERMINT OIL PO Take by mouth.     PSYLLIUM FIBER PO      RABEprazole  (ACIPHEX ) 20 MG tablet Take 1 tablet (20 mg total) by mouth 2 (two) times daily. 180 tablet 1   RESTASIS 0.05 % ophthalmic emulsion Place 1 drop into both eyes 2 (two) times daily.     rosuvastatin  (CRESTOR ) 5 MG tablet TAKE 1 TABLET EVERY DAY 90 tablet 3   sucralfate  (CARAFATE ) 1 g tablet Take 1 tablet (1 g total) by mouth 2 (two) times daily. 180 tablet 1   triamterene -hydrochlorothiazide  (MAXZIDE -25) 37.5-25 MG tablet TAKE 1/2 TABLET EVERY DAY 45 tablet 3   No current facility-administered medications for this visit.    Past Medical History:  Diagnosis Date   Abdominal hernia    Anxiety    situational stress and anxiety  Cancer (HCC) 2015   breast    Cataract cortical, senile, unspecified laterality    Chest pain    Exertional dyspnea    GERD (gastroesophageal reflux disease)    Hiatal hernia    Hiatal hernia    Hypercholesterolemia    Hyperlipidemia    Hypertension    Osteoarthritis    scoliosis, degenerative disc dz, knee, carpal tunnel   PONV (postoperative nausea and vomiting)    Sleep apnea     Past Surgical History:  Procedure Laterality Date   ABDOMINAL HYSTERECTOMY  07/2000   APPENDECTOMY  2000   APPENDECTOMY     BREAST BIOPSY Bilateral 2014   BREAST SURGERY Right 2015   lumpectomy   CARPAL TUNNEL RELEASE     CATARACT EXTRACTION W/PHACO Right 02/16/2023   Procedure: CATARACT EXTRACTION PHACO AND INTRAOCULAR LENS PLACEMENT (IOC) RIGHT  7.20  00:39.2;  Surgeon: Annell Kidney, MD;  Location: [facility];  Service: Ophthalmology;  Laterality: Right;   CATARACT EXTRACTION W/PHACO Left 03/02/2023   Procedure: CATARACT EXTRACTION PHACO AND INTRAOCULAR LENS PLACEMENT (IOC) LEFT  6.41  00:42.0;  Surgeon: Annell Kidney, MD;  Location: [facility];  Service: Ophthalmology;  Laterality: Left;   COLONOSCOPY  2011   [name], [name]: Normal exam   COLONOSCOPY WITH PROPOFOL  N/A 04/03/2020   Procedure: COLONOSCOPY WITH PROPOFOL ;  Surgeon: Toledo, Alphonsus Jeans, MD;  Location: [facility];  Service: Gastroenterology;  Laterality: N/A;   ESOPHAGOGASTRODUODENOSCOPY (EGD) WITH PROPOFOL  N/A 04/03/2020   Procedure: ESOPHAGOGASTRODUODENOSCOPY (EGD) WITH PROPOFOL ;  Surgeon: Toledo, Alphonsus Jeans, MD;  Location: [facility];  Service: Gastroenterology;  Laterality: N/A;   HERNIA REPAIR  02/10/15   Laparoscopic Repair of Lateral Abdominal Wall Hernia  02/10/15   MASTECTOMY     UPPER GI ENDOSCOPY  05/10/2014   [name], [name] Bile gastritis, medium hiatal hernia. Incomplete visualization of the cardia and fundus.     Social History   Tobacco Use   Smoking status: Never   Smokeless tobacco: Never  Vaping Use   Vaping status: Never Used  Substance Use Topics   Alcohol use: Not Currently    Comment: wine occasionally   Drug use: No      Family History  Problem Relation Age of Onset   Hypertension Mother    Diabetes Mother    Multiple myeloma Mother    Hypertension Father    Multiple myeloma Father    Diabetes Sister        x2   Diabetes Brother    Hypertension Brother    Dementia Brother      No Known Allergies      REVIEW OF SYSTEMS (Negative unless checked)   Constitutional: [] Weight loss  [] Fever  [] Chills Cardiac: [] Chest pain   [] Chest pressure   [] Palpitations   [] Shortness of breath when laying flat   [] Shortness of breath at rest   [x] Shortness of breath with exertion. Vascular:  [] Pain in legs with walking   [] Pain  in legs at rest   [] Pain in legs when laying flat   [] Claudication   [] Pain in feet when walking  [] Pain in feet at rest  [] Pain in feet when laying flat   [] History of DVT   [] Phlebitis   [x] Swelling in legs   [] Varicose veins   [] Non-healing ulcers Pulmonary:   [] Uses home oxygen   [] Productive cough   [] Hemoptysis   [] Wheeze  [] COPD   [] Asthma Neurologic:  [x] Dizziness  [] Blackouts   [] Seizures   [] History  of stroke   [] History of TIA  [] Aphasia   [] Temporary blindness   [] Dysphagia   [] Weakness or numbness in arms   [] Weakness or numbness in legs Musculoskeletal:  [x] Arthritis   [] Joint swelling   [x] Joint pain   [] Low back pain Hematologic:  [] Easy bruising  [] Easy bleeding   [] Hypercoagulable state   [] Anemic  [] Hepatitis Gastrointestinal:  [] Blood in stool   [] Vomiting blood  [x] Gastroesophageal reflux/heartburn   [] Abdominal pain Genitourinary:  [x] Chronic kidney disease   [] Difficult urination  [] Frequent urination  [] Burning with urination   [] Hematuria Skin:  [] Rashes   [] Ulcers   [] Wounds Psychological:  [] History of anxiety   []  History of major depression.  Physical Examination  BP (!) 145/74 (BP Location: Right Arm)   Pulse 83   Resp 16   Ht 4\' 11"  (1.499 m)   Wt 144 lb (65.3 kg)   BMI 29.08 kg/m  Gen:  WD/WN, NAD. Appears younger than stated age. Head: [place]/AT, No temporalis wasting. Ear/Nose/Throat: Hearing grossly intact, nares w/o erythema or drainage Eyes: Conjunctiva clear. Sclera non-icteric Neck: Supple.  Trachea midline Pulmonary:  Good air movement, no use of accessory muscles.  Cardiac: RRR, no JVD Vascular:  Vessel Right Left  Radial Palpable Palpable           Musculoskeletal: M/S 5/5 throughout.  No deformity or atrophy. Trace RLE edema, 1+ LLE edema.  Mild hyperpigmentation is present predominantly in the left lower extremity. Neurologic: Sensation grossly intact in extremities.  Symmetrical.  Speech is fluent.  Psychiatric: Judgment intact, Mood & affect  appropriate for pt's clinical situation. Dermatologic: No rashes or ulcers noted.  No cellulitis or open wounds.      Labs Recent Results (from the past 2160 hours)  Hepatic function panel     Status: None   Collection Time: 05/04/24 10:17 AM  Result Value Ref Range   Total Bilirubin 0.5 0.2 - 1.2 mg/dL   Bilirubin, Direct 0.1 0.0 - 0.3 mg/dL   Alkaline Phosphatase 46 39 - 117 U/L   AST 21 0 - 37 U/L   ALT 17 0 - 35 U/L   Total Protein 6.5 6.0 - 8.3 g/dL   Albumin 4.1 3.5 - 5.2 g/dL  Basic metabolic panel     Status: Abnormal   Collection Time: 05/04/24 10:17 AM  Result Value Ref Range   Sodium 138 135 - 145 mEq/L   Potassium 4.0 3.5 - 5.1 mEq/L   Chloride 102 96 - 112 mEq/L   CO2 30 19 - 32 mEq/L   Glucose, Bld 96 70 - 99 mg/dL   BUN 19 6 - 23 mg/dL   Creatinine, Ser 1.61 0.40 - 1.20 mg/dL   GFR 09.60 (L) >45.40 mL/min    Comment: Calculated using the CKD-EPI Creatinine Equation (2021)   Calcium  9.5 8.4 - 10.5 mg/dL  Hemoglobin J8J     Status: None   Collection Time: 05/04/24 10:17 AM  Result Value Ref Range   Hgb A1c MFr Bld 6.2 4.6 - 6.5 %    Comment: Glycemic Control Guidelines for People with Diabetes:Non Diabetic:  <6%Goal of Therapy: <7%Additional Action Suggested:  >8%   TSH     Status: None   Collection Time: 05/04/24 10:17 AM  Result Value Ref Range   TSH 2.12 0.35 - 5.50 uIU/mL  Lipid panel     Status: None   Collection Time: 05/04/24 10:17 AM  Result Value Ref Range   Cholesterol 167 0 - 200 mg/dL  Comment: ATP III Classification       Desirable:  < 200 mg/dL               Borderline High:  200 - 239 mg/dL          High:  > = 161 mg/dL   Triglycerides 09.6 0.0 - 149.0 mg/dL    Comment: Normal:  <045 mg/dLBorderline High:  150 - 199 mg/dL   HDL 40.98 >11.91 mg/dL   VLDL 47.8 0.0 - 29.5 mg/dL   LDL Cholesterol 75 0 - 99 mg/dL   Total CHOL/HDL Ratio 2     Comment:                Men          Women1/2 Average Risk     3.4          3.3Average Risk           5.0          4.42X Average Risk          9.6          7.13X Average Risk          15.0          11.0                       NonHDL 92.94     Comment: NOTE:  Non-HDL goal should be 30 mg/dL higher than patient's LDL goal (i.e. LDL goal of < 70 mg/dL, would have non-HDL goal of < 100 mg/dL)    Radiology No results found.  Assessment/Plan  Lymphedema The patient has stage II lymphedema with swelling refractory to compression, elevation, and exercise.  She has been doing these for many years.  She has had problems with swelling since she was young, so this is likely primary lymphedema.  She has hyperpigmentation of the skin and refractory swelling so at this point I would recommend a lymphedema pump for adjuvant therapy.  This would be in addition to compression, elevation, and exercise.  I have discussed the pathophysiology and natural history of lymphedema.  Her venous reflux study today demonstrated no DVT, superficial thrombophlebitis, or significant superficial reflux in either lower extremity.  There is only a small segment of reflux in the left common femoral vein.  Hypercholesterolemia lipid control important in reducing the progression of atherosclerotic disease. Continue statin therapy     Hypertension blood pressure control important in reducing the progression of atherosclerotic disease. On appropriate oral medications.  Mikki Alexander, MD  05/22/2024 3:50 PM    This note was created with Dragon medical transcription system.  Any errors from dictation are purely unintentional

## 2024-05-22 NOTE — Assessment & Plan Note (Signed)
 The patient has stage II lymphedema with swelling refractory to compression, elevation, and exercise.  She has been doing these for many years.  She has had problems with swelling since she was young, so this is likely primary lymphedema.  She has hyperpigmentation of the skin and refractory swelling so at this point I would recommend a lymphedema pump for adjuvant therapy.  This would be in addition to compression, elevation, and exercise.  I have discussed the pathophysiology and natural history of lymphedema.  Her venous reflux study today demonstrated no DVT, superficial thrombophlebitis, or significant superficial reflux in either lower extremity.  There is only a small segment of reflux in the left common femoral vein.

## 2024-06-01 NOTE — Progress Notes (Signed)
 [facility] [address] [place], [place] [address]  Pulmonary Sleep Medicine   Office Visit Note  Patient Name: Carla Ryan DOB: June 23, 1946 MRN [id number]    Chief Complaint: Obstructive Sleep Apnea visit  Brief History:  Carla Ryan is seen today for a 3 month follow up visit for APAP@ 8-11 cmH2O. The patient has a 11 year history of sleep apnea. Patient is using PAP nightly.  The patient feels rested after sleeping with PAP.  The patient reports benefiting from PAP use. Reported sleepiness is  improved and the Epworth Sleepiness Score is 2 out of 24. The patient will rarely take naps. The patient complains of the following: occasional headaches.  The compliance download shows 98% compliance with an average use time of 7 hours 19 minutes. The AHI is 2.4.  The patient does not complain of limb movements disrupting sleep. The patient continues to require PAP therapy in order to eliminate sleep apnea.   ROS  General: (-) fever, (-) chills, (-) night sweat Nose and Sinuses: (-) nasal stuffiness or itchiness, (-) postnasal drip, (-) nosebleeds, (-) sinus trouble. Mouth and Throat: (-) sore throat, (-) hoarseness. Neck: (-) swollen glands, (-) enlarged thyroid , (-) neck pain. Respiratory: - cough, - shortness of breath, - wheezing. Neurologic: - numbness, - tingling. Psychiatric: - anxiety, - depression   Current Medication: Outpatient Encounter Medications as of 06/04/2024  Medication Sig Note   Ascorbic Acid (VITAMIN C) 1000 MG tablet Take 1,000 mg by mouth daily.    aspirin 81 MG tablet Take 81 mg by mouth daily.    Azelastine  HCl 137 MCG/SPRAY SOLN INSTILL TWO SPRAYS INTO EACH NOSTIL TWICE A DAY AS DIRECTED.    Biotin 5 MG CAPS Take by mouth daily.    calcium  citrate-vitamin D  500-400 MG-UNIT chewable tablet Chew 1 tablet by mouth 2 (two) times daily.    cholecalciferol (VITAMIN D ) 1000 UNITS tablet Take 2,000 Units by mouth in the morning and at bedtime.      clindamycin (CLEOCIN T) 1 % external solution Apply topically.    Clobetasol Propionate (IMPOYZ) 0.025 % CREA     fluticasone  (FLONASE ) 50 MCG/ACT nasal spray USE 2 SPRAYS IN EACH NOSTRIL EVERY DAY    gabapentin  (NEURONTIN ) 100 MG capsule Take 2 capsules (200 mg total) by mouth at bedtime. 10/12/2023: Takes 1-2 qhs   irbesartan  (AVAPRO ) 75 MG tablet TAKE 1 TABLET EVERY DAY    levocetirizine (XYZAL ) 5 MG tablet Take 1 tablet (5 mg total) by mouth every evening.    lidocaine  (LIDODERM ) 5 % Place 1 patch onto the skin as needed. Remove & Discard patch within 12 hours or as directed by MD    Magnesium Glycinate 100 MG CAPS Take 200 mg by mouth daily. 10/12/2023: 240mg  daily   methocarbamol  (ROBAXIN ) 500 MG tablet Take 1 tablet (500 mg total) by mouth at bedtime as needed for muscle spasms. (Patient taking differently: Take 500 mg by mouth at bedtime as needed for muscle spasms. Pt taking prn)    Misc Natural Products (TART CHERRY ADVANCED) CAPS Take 1 capsule by mouth 2 (two) times daily. Reported on 03/04/2016    MULTIPLE MINERALS-VITAMINS PO     PEPPERMINT OIL PO Take by mouth.    PSYLLIUM FIBER PO     RABEprazole  (ACIPHEX ) 20 MG tablet Take 1 tablet (20 mg total) by mouth 2 (two) times daily.    RESTASIS 0.05 % ophthalmic emulsion Place 1 drop into both eyes 2 (two) times daily. 10/12/2023:  Hasn't started yet   rosuvastatin  (CRESTOR ) 5 MG tablet TAKE 1 TABLET EVERY DAY    sucralfate  (CARAFATE ) 1 g tablet Take 1 tablet (1 g total) by mouth 2 (two) times daily.    triamterene -hydrochlorothiazide  (MAXZIDE -25) 37.5-25 MG tablet TAKE 1/2 TABLET EVERY DAY    No facility-administered encounter medications on file as of 06/04/2024.    Surgical History: Past Surgical History:  Procedure Laterality Date   ABDOMINAL HYSTERECTOMY  07/2000   APPENDECTOMY  2000   APPENDECTOMY     BREAST BIOPSY Bilateral 2014   BREAST SURGERY Right 2015   lumpectomy   CARPAL TUNNEL RELEASE     CATARACT EXTRACTION  W/PHACO Right 02/16/2023   Procedure: CATARACT EXTRACTION PHACO AND INTRAOCULAR LENS PLACEMENT (IOC) RIGHT  7.20  00:39.2;  Surgeon: Mittie Gaskin, MD;  Location: [facility];  Service: Ophthalmology;  Laterality: Right;   CATARACT EXTRACTION W/PHACO Left 03/02/2023   Procedure: CATARACT EXTRACTION PHACO AND INTRAOCULAR LENS PLACEMENT (IOC) LEFT  6.41  00:42.0;  Surgeon: Mittie Gaskin, MD;  Location: [facility];  Service: Ophthalmology;  Laterality: Left;   COLONOSCOPY  2011   Gladis Mariner, [name]: Normal exam   COLONOSCOPY WITH PROPOFOL  N/A 04/03/2020   Procedure: COLONOSCOPY WITH PROPOFOL ;  Surgeon: Toledo, Ladell POUR, MD;  Location: [facility];  Service: Gastroenterology;  Laterality: N/A;   ESOPHAGOGASTRODUODENOSCOPY (EGD) WITH PROPOFOL  N/A 04/03/2020   Procedure: ESOPHAGOGASTRODUODENOSCOPY (EGD) WITH PROPOFOL ;  Surgeon: Toledo, Ladell POUR, MD;  Location: [facility];  Service: Gastroenterology;  Laterality: N/A;   HERNIA REPAIR  02/10/15   Laparoscopic Repair of Lateral Abdominal Wall Hernia  02/10/15   MASTECTOMY     UPPER GI ENDOSCOPY  05/10/2014   Gladis Mariner, [name] Bile gastritis, medium hiatal hernia. Incomplete visualization of the cardia and fundus.    Medical History: Past Medical History:  Diagnosis Date   Abdominal hernia    Anxiety    situational stress and anxiety   Cancer (HCC) 2015   breast    Cataract cortical, senile, unspecified laterality    Chest pain    Exertional dyspnea    GERD (gastroesophageal reflux disease)    Hiatal hernia    Hiatal hernia    Hypercholesterolemia    Hyperlipidemia    Hypertension    Osteoarthritis    scoliosis, degenerative disc dz, knee, carpal tunnel   PONV (postoperative nausea and vomiting)    Sleep apnea     Family History: Non contributory to the present illness  Social History: Social History   Socioeconomic History   Marital status: Married    Spouse name: Corporate treasurer   Number of  children: 2   Years of education: Not on file   Highest education level: 12th grade  Occupational History   Occupation: retired  Tobacco Use   Smoking status: Never   Smokeless tobacco: Never  Vaping Use   Vaping status: Never Used  Substance and Sexual Activity   Alcohol use: Not Currently    Comment: wine occasionally   Drug use: No   Sexual activity: Yes  Other Topics Concern   Not on file  Social History Narrative   Not on file   Social Drivers of Health   Financial Resource Strain: Low Risk  (05/15/2024)   Overall Financial Resource Strain (CARDIA)    Difficulty of Paying Living Expenses: Not hard at all  Food Insecurity: No Food Insecurity (05/15/2024)   Hunger Vital Sign    Worried About Running Out of Food in the Last  Year: Never true    Ran Out of Food in the Last Year: Never true  Transportation Needs: No Transportation Needs (05/15/2024)   PRAPARE - Administrator, Civil Service (Medical): No    Lack of Transportation (Non-Medical): No  Physical Activity: Insufficiently Active (10/12/2023)   Exercise Vital Sign    Days of Exercise per Week: 3 days    Minutes of Exercise per Session: 10 min  Stress: No Stress Concern Present (05/15/2024)   Carla Ryan    Feeling of Stress : Only a little  Social Connections: Socially Integrated (05/15/2024)   Social Connection and Isolation Panel    Frequency of Communication with Friends and Family: Three times a week    Frequency of Social Gatherings with Friends and Family: Three times a week    Attends Religious Services: More than 4 times per year    Active Member of Clubs or Organizations: Yes    Attends Banker Meetings: More than 4 times per year    Marital Status: Married  Catering manager Violence: Not At Risk (10/12/2023)   Humiliation, Afraid, Rape, and Kick Ryan    Fear of Current or Ex-Partner: No    Emotionally  Abused: No    Physically Abused: No    Sexually Abused: No    Vital Signs: Blood pressure 123/60, pulse 67, resp. rate 16, height 4' 11 (1.499 m), weight 145 lb (65.8 kg), SpO2 97%. Body mass index is 29.29 kg/m.    Examination: General Appearance: The patient is well-developed, well-nourished, and in no distress. Neck Circumference: 39 cm Skin: Gross inspection of skin unremarkable. Head: normocephalic, no gross deformities. Eyes: no gross deformities noted. ENT: ears appear grossly normal Neurologic: Alert and oriented. No involuntary movements.  STOP BANG RISK ASSESSMENT S (snore) Have you been told that you snore?     NO   T (tired) Are you often tired, fatigued, or sleepy during the day?   NO  O (obstruction) Do you stop breathing, choke, or gasp during sleep? NO   P (pressure) Do you have or are you being treated for high blood pressure? YES   B (BMI) Is your body index greater than 35 kg/m? NO   A (age) Are you 58 years old or older? YES   N (neck) Do you have a neck circumference greater than 16 inches?   NO   G (gender) Are you a female? NO   TOTAL STOP/BANG "YES" ANSWERS 2       A STOP-Bang score of 2 or less is considered low risk, and a score of 5 or more is high risk for having either moderate or severe OSA. For people who score 3 or 4, doctors may need to perform further assessment to determine how likely they are to have OSA.         EPWORTH SLEEPINESS SCALE:  Scale:  (0)= no chance of dozing; (1)= slight chance of dozing; (2)= moderate chance of dozing; (3)= high chance of dozing  Chance  Situtation    Sitting and reading: 0    Watching TV: 1    Sitting Inactive in public: 0    As a passenger in car: 1      Lying down to rest: 0    Sitting and talking: 0    Sitting quielty after lunch: 0    In a car, stopped in traffic: 0   TOTAL SCORE:   2  out of 24    SLEEP STUDIES:  PSG (10/2013) AHI 41/hr, min SpO2 74%   CPAP COMPLIANCE  DATA:  Date Range: 06/05/2023-06/03/2024  Average Daily Use: 7 hours 19 minutes  Median Use: 7 hours 31 minutes  Compliance for > 4 Hours: 98%  AHI: 2.4 respiratory events per hour  Days Used: 361/365 days  Mask Leak: 33.5  95th Percentile Pressure: 10.9         LABS: Recent Results (from the past 2160 hours)  Hepatic function panel     Status: None   Collection Time: 05/04/24 10:17 AM  Result Value Ref Range   Total Bilirubin 0.5 0.2 - 1.2 mg/dL   Bilirubin, Direct 0.1 0.0 - 0.3 mg/dL   Alkaline Phosphatase 46 39 - 117 U/L   AST 21 0 - 37 U/L   ALT 17 0 - 35 U/L   Total Protein 6.5 6.0 - 8.3 g/dL   Albumin 4.1 3.5 - 5.2 g/dL  Basic metabolic panel     Status: Abnormal   Collection Time: 05/04/24 10:17 AM  Result Value Ref Range   Sodium 138 135 - 145 mEq/L   Potassium 4.0 3.5 - 5.1 mEq/L   Chloride 102 96 - 112 mEq/L   CO2 30 19 - 32 mEq/L   Glucose, Bld 96 70 - 99 mg/dL   BUN 19 6 - 23 mg/dL   Creatinine, Ser 8.98 0.40 - 1.20 mg/dL   GFR 47.32 (L) >39.99 mL/min    Comment: Calculated using the CKD-EPI Creatinine Equation (2021)   Calcium  9.5 8.4 - 10.5 mg/dL  Hemoglobin J8r     Status: None   Collection Time: 05/04/24 10:17 AM  Result Value Ref Range   Hgb A1c MFr Bld 6.2 4.6 - 6.5 %    Comment: Glycemic Control Guidelines for People with Diabetes:Non Diabetic:  <6%Goal of Therapy: <7%Additional Action Suggested:  >8%   TSH     Status: None   Collection Time: 05/04/24 10:17 AM  Result Value Ref Range   TSH 2.12 0.35 - 5.50 uIU/mL  Lipid panel     Status: None   Collection Time: 05/04/24 10:17 AM  Result Value Ref Range   Cholesterol 167 0 - 200 mg/dL    Comment: ATP III Classification       Desirable:  < 200 mg/dL               Borderline High:  200 - 239 mg/dL          High:  > = 759 mg/dL   Triglycerides 09.9 0.0 - 149.0 mg/dL    Comment: Normal:  <849 mg/dLBorderline High:  150 - 199 mg/dL   HDL 25.79 >60.99 mg/dL   VLDL 81.9 0.0 - 59.9 mg/dL    LDL Cholesterol 75 0 - 99 mg/dL   Total CHOL/HDL Ratio 2     Comment:                Men          Women1/2 Average Risk     3.4          3.3Average Risk          5.0          4.42X Average Risk          9.6          7.13X Average Risk          15.0  11.0                       NonHDL 92.94     Comment: NOTE:  Non-HDL goal should be 30 mg/dL higher than patient's LDL goal (i.e. LDL goal of < 70 mg/dL, would have non-HDL goal of < 100 mg/dL)    Radiology: DG Bone Density Result Date: 01/20/2024 EXAM: DUAL X-RAY ABSORPTIOMETRY (DXA) FOR BONE MINERAL DENSITY IMPRESSION: Your patient Glorianna Gott completed a BMD test on 01/20/2024 using the [employer] iDXA DXA System (software version: 14.10) manufactured by [employer]. The following summarizes the results of our evaluation. Technologist: SCE PATIENT BIOGRAPHICAL: Name: Cola, Gane Patient ID: [id number] Birth Date: 03/17/1946 Height: 59.0 in. Gender: Female Exam Date: 01/20/2024 Weight: 139.8 lbs. Indications: Advanced Age, Caucasian, Height Loss, History of Breast Cancer, History of Radiation, Hysterectomy, Oophorectomy Bilateral, Osteoarthritis, Postmenopausal Fractures: Treatments: Calcium , Vitamin D  DENSITOMETRY RESULTS: Site         Region     Measured Date Measured Age WHO Classification Young Adult T-score BMD         %Change vs. Previous Significant Change (*) DualFemur Neck Left 01/20/20[age].9 Oste[age] -1.2 0.869 g/cm2 - - Left Forearm Radius 33% 01/20/2024 79.9 Osteopenia -1.6 0.739 g/cm2 - - ASSESSMENT: The BMD measured at Forearm Radius 33% is 0.739 g/cm2 with a T-score of -1.6. This patient is considered osteopenic according to World Health Organization [facility]) criteria. The scan quality is good. Lumbar spine was not utilized due to advanced degenerative changes. World Science writer [facility]) criteria for post-menopausal, Caucasian Women: Normal:                   T-score at or above -1 SD Osteopenia/low bone mass: T-score  between -1 and -2.5 SD Osteoporosis:             T-score at or below -2.5 SD RECOMMENDATIONS: 1. All patients should optimize calcium  and vitamin D  intake. 2. Consider FDA-approved medical therapies in postmenopausal women and men aged 88 years and older, based on the following: a. A hip or vertebral(clinical or morphometric) fracture b. T-score < -2.5 at the femoral neck or spine after appropriate evaluation to exclude secondary causes c. Low bone mass (T-score between -1.0 and -2.5 at the femoral neck or spine) and a 10-year probability of a hip fracture > 3% or a 10-year probability of a major osteoporosis-related fracture > 20% based on the [place] -adapted WHO algorithm 3. Clinician judgment and/or patient preferences may indicate treatment for people with 10-year fracture probabilities above or below these levels FOLLOW-UP: People with diagnosed cases of osteoporosis or at high risk for fracture should have regular bone mineral density tests. For patients eligible for Medicare, routine testing is allowed once every 2 years. The testing frequency can be increased to one year for patients who have rapidly progressing disease, those who are receiving or discontinuing medical therapy to restore bone mass, or have additional risk factors. I have reviewed this report, and agree with the above findings. [facility] Dear Carla Ryan, Your patient Carla Ryan completed a FRAX assessment on 01/20/2024 using the [facility] iDXA DXA System (analysis version: 14.10) manufactured by [employer]. The following summarizes the results of our evaluation. PATIENT BIOGRAPHICAL: Name: Carla Ryan, Carla Ryan Patient ID: [id number] Birth Date: 09-05-1946 Height:    59.0 in. Gender:     Female    [age]  Weight:    139.8 lbs. Ethnicity:  White                            Exam Date: 01/20/2024 FRAX* RESULTS:  (version: 3.5) 10-year Probability of Fracture1 Major Osteoporotic Fracture2 Hip Fracture 12.3% 2.6%  Population: [place]  (Caucasian) Risk Factors: None Based on Femur (Left) Neck BMD 1 -The 10-year probability of fracture may be lower than reported if the patient has received treatment. 2 -Major Osteoporotic Fracture: Clinical Spine, Forearm, Hip or Shoulder *FRAX is a Armed forces logistics/support/administrative officer of the [employer] of [employer] for Metabolic Bone Disease, a World Science writer (WHO) [employer]. ASSESSMENT: The probability of a major osteoporotic fracture is 12.3% within the next ten years. The probability of a hip fracture is 2.6% within the next ten years. . Electronically Signed   By: Norman Hopper [name]   On: 01/20/2024 11:00    No results found.  VAS US  LOWER EXTREMITY VENOUS REFLUX Result Date: 05/24/2024  Lower Venous Reflux Study Patient Name:  Carla Ryan  Date of Exam:   05/22/2024 Medical Rec #: [id number]         Accession #:    [id number] Date of Birth: Mar 28, 1946         Patient Gender: F Patient Age:   19 years Exam Location:  [facility] Procedure:      VAS US  LOWER EXTREMITY VENOUS REFLUX Referring Phys: SELINDA DEW --------------------------------------------------------------------------------  Indications: Swelling, and Lt > Rt.  Performing Technologist: Jerel Croak RVT  Examination Guidelines: A complete evaluation includes B-mode imaging, spectral Doppler, color Doppler, and power Doppler as needed of all accessible portions of each vessel. Bilateral testing is considered an integral part of a complete examination. Limited examinations for reoccurring indications may be performed as noted. The reflux portion of the exam is performed with the patient in reverse Trendelenburg. Significant venous reflux is defined as >500 ms in the superficial venous system, and >1 second in the deep venous system.  +----+---------+------+-----------+------------+--------+ LEFTReflux NoRefluxReflux TimeDiameter cmsComments               Yes                                   +----+---------+------+-----------+------------+--------+ CFV           yes   >1 second                      +----+---------+------+-----------+------------+--------+   Summary: Bilateral: - No evidence of deep vein thrombosis seen in the lower extremities, bilaterally, from the common femoral through the popliteal veins. - No evidence of superficial venous thrombosis in the lower extremities, bilaterally. - No evidence of superficial venous reflux seen in the greater saphenous veins bilaterally. - No evidence of superficial venous reflux seen in the short saphenous veins bilaterally.  Right: - There is no evidence of venous reflux seen in the right lower extremity.  Left: - Venous reflux is noted in the left common femoral vein.  *See table(s) above for measurements and observations. Electronically signed by SELINDA Gu MD on 05/24/2024 at 8:33:25 AM.    Final       Assessment and Plan: Patient Active Problem List   Diagnosis Date Noted   Lymphedema 05/22/2024   Finger lesion 05/20/2024   Swelling of limb 04/17/2024   Peripheral edema  03/19/2024   OSA (obstructive sleep apnea) 03/12/2024   CPAP use counseling 03/12/2024   URI (upper respiratory infection) 11/13/2023   Acute cough 11/02/2023   Seasonal allergies 10/11/2023   Neck pain 09/04/2023   Persistent cough 10/04/2022   Bronchitis 09/22/2022   Upper abdominal pain 08/28/2022   History of breast cancer 08/27/2022   Nausea 08/27/2022   Back skin lesion 04/18/2022   Has immunity to COVID-19 virus 09/18/2020   Headache 08/22/2020   Dizziness 08/22/2020   Stress 04/20/2020   Decreased GFR 03/11/2020   Leg pain 11/04/2019   Osteopenia 11/04/2018   Rectal bleeding 06/27/2018   Encounter for monitoring aromatase inhibitor therapy 05/31/2017   ANA positive 10/06/2016   De Quervain's tenosynovitis 10/06/2016   Drug toxicity 10/06/2016   Joint pain 06/08/2016   Sleeping difficulty 04/06/2015   SOB (shortness of breath)  02/02/2015   Health care maintenance 02/02/2015   Paraesophageal hiatal hernia 01/21/2015   Esophageal reflux 01/21/2015   Hernia of abdominal cavity 10/22/2014   Back pain 10/13/2014   Hyperglycemia 10/08/2014   Abdominal hernia 10/08/2014   Scoliosis 08/16/2014   Knee pain, right 07/09/2014   Obstructive sleep apnea 12/23/2013   Chest pain 10/01/2013   Hypertension 10/25/2012   GERD (gastroesophageal reflux disease) 10/25/2012   Hypercholesterolemia 10/25/2012   Allergic rhinitis 10/25/2012   1. OSA (obstructive sleep apnea) (Primary)  The patient does tolerate PAP and reports  benefit from PAP use. The patient was reminded how to clean equipment and advised to replace supplies routinely. The patient was also counselled on weight loss. The compliance is excellent. The AHI is 2.4.   OSA on cpap- controlled. Continue with excellent compliance with pap. CPAP continues to be medically necessary to treat this patient's OSA. F/u one year.   2. CPAP use counseling CPAP Counseling: had a lengthy discussion with the patient regarding the importance of PAP therapy in management of the sleep apnea. Patient appears to understand the risk factor reduction and also understands the risks associated with untreated sleep apnea. Patient will try to make a good faith effort to remain compliant with therapy. Also instructed the patient on proper cleaning of the device including the water must be changed daily if possible and use of distilled water is preferred. Patient understands that the machine should be regularly cleaned with appropriate recommended cleaning solutions that do not damage the PAP machine for example given white vinegar and water rinses. Other methods such as ozone treatment may not be as good as these simple methods to achieve cleaning.    General Counseling: I have discussed the findings of the evaluation and examination with Carla Ryan.  I have also discussed any further diagnostic evaluation  thatmay be needed or ordered today. Carla Ryan verbalizes understanding of the findings of todays visit. We also reviewed her medications today and discussed drug interactions and side effects including but not limited excessive drowsiness and altered mental states. We also discussed that there is always a risk not just to her but also people around her. she has been encouraged to call the office with any questions or concerns that should arise related to todays visit.  No orders of the defined types were placed in this encounter.       I have personally obtained a history, examined the patient, evaluated laboratory and imaging results, formulated the assessment and plan and placed orders. This patient was seen today by Carla Lay, PA-C in collaboration with Dr. Elfreda Bathe.   Saadat A  Fernand, MD [facility] Diplomate ABMS Pulmonary Critical Care Medicine and Sleep Medicine

## 2024-06-04 ENCOUNTER — Ambulatory Visit (INDEPENDENT_AMBULATORY_CARE_PROVIDER_SITE_OTHER): Admitting: Internal Medicine

## 2024-06-04 VITALS — BP 123/60 | HR 67 | Resp 16 | Ht 59.0 in | Wt 145.0 lb

## 2024-06-04 DIAGNOSIS — G4733 Obstructive sleep apnea (adult) (pediatric): Secondary | ICD-10-CM | POA: Diagnosis not present

## 2024-06-04 DIAGNOSIS — Z7189 Other specified counseling: Secondary | ICD-10-CM

## 2024-06-04 NOTE — Patient Instructions (Signed)

## 2024-06-05 ENCOUNTER — Other Ambulatory Visit (INDEPENDENT_AMBULATORY_CARE_PROVIDER_SITE_OTHER)

## 2024-06-05 ENCOUNTER — Other Ambulatory Visit: Payer: Self-pay

## 2024-06-05 DIAGNOSIS — R944 Abnormal results of kidney function studies: Secondary | ICD-10-CM

## 2024-06-05 LAB — BASIC METABOLIC PANEL WITH GFR
BUN: 22 mg/dL (ref 6–23)
CO2: 28 meq/L (ref 19–32)
Calcium: 9.2 mg/dL (ref 8.4–10.5)
Chloride: 102 meq/L (ref 96–112)
Creatinine, Ser: 0.98 mg/dL (ref 0.40–1.20)
GFR: 54.58 mL/min — ABNORMAL LOW (ref >60.00)
Glucose, Bld: 106 mg/dL — ABNORMAL HIGH (ref 70–99)
Potassium: 4 meq/L (ref 3.5–5.1)
Sodium: 136 meq/L (ref 135–145)

## 2024-06-06 ENCOUNTER — Ambulatory Visit: Payer: Self-pay | Admitting: Internal Medicine

## 2024-06-15 ENCOUNTER — Ambulatory Visit: Admitting: Internal Medicine

## 2024-06-15 DIAGNOSIS — L0390 Cellulitis, unspecified: Secondary | ICD-10-CM | POA: Diagnosis not present

## 2024-06-15 DIAGNOSIS — L570 Actinic keratosis: Secondary | ICD-10-CM | POA: Diagnosis not present

## 2024-07-03 ENCOUNTER — Encounter: Payer: Self-pay | Admitting: Internal Medicine

## 2024-07-03 DIAGNOSIS — R1084 Generalized abdominal pain: Secondary | ICD-10-CM

## 2024-07-03 NOTE — Telephone Encounter (Signed)
 Please call her and let her know that I am not in the ofice this week. I have placed the order for the GI referral - [facility].  Also, if she is having increased abdominal pain, agree with evaluation and see if CT scan needed.

## 2024-07-04 NOTE — Telephone Encounter (Signed)
 Called pt and she stated that she is feeling better today, but she stated that she did not eat anything yesterday.

## 2024-07-05 NOTE — Telephone Encounter (Signed)
 Called patient to check on her. She was able to eat some yesterday and today with minimal pain. She is aware to go to ED if symptoms worsen or change. Pt is going to call [facility] and see what their first available appointment is.

## 2024-07-06 ENCOUNTER — Telehealth (INDEPENDENT_AMBULATORY_CARE_PROVIDER_SITE_OTHER): Payer: Self-pay | Admitting: Vascular Surgery

## 2024-07-06 NOTE — Telephone Encounter (Signed)
 Patient is calling RE: [facility]. As of today she has not received a call from them. Please advise.

## 2024-07-09 NOTE — Telephone Encounter (Signed)
 Pump information has been faxed to [facility].

## 2024-07-18 DIAGNOSIS — M5126 Other intervertebral disc displacement, lumbar region: Secondary | ICD-10-CM | POA: Diagnosis not present

## 2024-07-18 DIAGNOSIS — M48062 Spinal stenosis, lumbar region with neurogenic claudication: Secondary | ICD-10-CM | POA: Diagnosis not present

## 2024-07-18 DIAGNOSIS — M5416 Radiculopathy, lumbar region: Secondary | ICD-10-CM | POA: Diagnosis not present

## 2024-07-25 ENCOUNTER — Other Ambulatory Visit: Payer: Self-pay | Admitting: Family Medicine

## 2024-07-25 DIAGNOSIS — M5412 Radiculopathy, cervical region: Secondary | ICD-10-CM

## 2024-07-25 DIAGNOSIS — M4802 Spinal stenosis, cervical region: Secondary | ICD-10-CM | POA: Diagnosis not present

## 2024-07-25 DIAGNOSIS — M5416 Radiculopathy, lumbar region: Secondary | ICD-10-CM | POA: Diagnosis not present

## 2024-07-25 DIAGNOSIS — M5126 Other intervertebral disc displacement, lumbar region: Secondary | ICD-10-CM | POA: Diagnosis not present

## 2024-07-25 DIAGNOSIS — M48062 Spinal stenosis, lumbar region with neurogenic claudication: Secondary | ICD-10-CM | POA: Diagnosis not present

## 2024-08-02 DIAGNOSIS — R1013 Epigastric pain: Secondary | ICD-10-CM | POA: Diagnosis not present

## 2024-08-02 DIAGNOSIS — M549 Dorsalgia, unspecified: Secondary | ICD-10-CM | POA: Diagnosis not present

## 2024-08-04 ENCOUNTER — Ambulatory Visit
Admission: RE | Admit: 2024-08-04 | Discharge: 2024-08-04 | Disposition: A | Discharge status: Home / Self Care | Source: Ambulatory Visit | Attending: Family Medicine | Admitting: Family Medicine

## 2024-08-04 DIAGNOSIS — M4722 Other spondylosis with radiculopathy, cervical region: Secondary | ICD-10-CM | POA: Diagnosis not present

## 2024-08-04 DIAGNOSIS — M5412 Radiculopathy, cervical region: Secondary | ICD-10-CM

## 2024-08-04 DIAGNOSIS — M4802 Spinal stenosis, cervical region: Secondary | ICD-10-CM | POA: Diagnosis not present

## 2024-08-04 DIAGNOSIS — M4312 Spondylolisthesis, cervical region: Secondary | ICD-10-CM | POA: Diagnosis not present

## 2024-08-16 ENCOUNTER — Other Ambulatory Visit

## 2024-08-21 DIAGNOSIS — M5414 Radiculopathy, thoracic region: Secondary | ICD-10-CM | POA: Diagnosis not present

## 2024-08-21 DIAGNOSIS — M4802 Spinal stenosis, cervical region: Secondary | ICD-10-CM | POA: Diagnosis not present

## 2024-08-21 DIAGNOSIS — M5412 Radiculopathy, cervical region: Secondary | ICD-10-CM | POA: Diagnosis not present

## 2024-08-22 ENCOUNTER — Other Ambulatory Visit: Payer: Self-pay | Admitting: Family Medicine

## 2024-08-22 ENCOUNTER — Encounter: Payer: Self-pay | Admitting: Family Medicine

## 2024-08-22 DIAGNOSIS — M5414 Radiculopathy, thoracic region: Secondary | ICD-10-CM

## 2024-08-27 DIAGNOSIS — M5412 Radiculopathy, cervical region: Secondary | ICD-10-CM | POA: Diagnosis not present

## 2024-08-27 DIAGNOSIS — M4802 Spinal stenosis, cervical region: Secondary | ICD-10-CM | POA: Diagnosis not present

## 2024-08-29 ENCOUNTER — Ambulatory Visit
Admission: RE | Admit: 2024-08-29 | Discharge: 2024-08-29 | Disposition: A | Discharge status: Home / Self Care | Source: Ambulatory Visit | Attending: Family Medicine | Admitting: Family Medicine

## 2024-08-29 DIAGNOSIS — M5124 Other intervertebral disc displacement, thoracic region: Secondary | ICD-10-CM | POA: Diagnosis not present

## 2024-08-29 DIAGNOSIS — M4804 Spinal stenosis, thoracic region: Secondary | ICD-10-CM | POA: Diagnosis not present

## 2024-08-29 DIAGNOSIS — M5414 Radiculopathy, thoracic region: Secondary | ICD-10-CM

## 2024-08-29 DIAGNOSIS — M47814 Spondylosis without myelopathy or radiculopathy, thoracic region: Secondary | ICD-10-CM | POA: Diagnosis not present

## 2024-09-07 DIAGNOSIS — K449 Diaphragmatic hernia without obstruction or gangrene: Secondary | ICD-10-CM | POA: Diagnosis not present

## 2024-09-07 DIAGNOSIS — R140 Abdominal distension (gaseous): Secondary | ICD-10-CM | POA: Diagnosis not present

## 2024-09-07 DIAGNOSIS — R1013 Epigastric pain: Secondary | ICD-10-CM | POA: Diagnosis not present

## 2024-09-14 ENCOUNTER — Other Ambulatory Visit: Payer: Self-pay | Admitting: Gastroenterology

## 2024-09-14 DIAGNOSIS — K449 Diaphragmatic hernia without obstruction or gangrene: Secondary | ICD-10-CM

## 2024-09-14 DIAGNOSIS — K439 Ventral hernia without obstruction or gangrene: Secondary | ICD-10-CM

## 2024-09-17 ENCOUNTER — Other Ambulatory Visit (INDEPENDENT_AMBULATORY_CARE_PROVIDER_SITE_OTHER)

## 2024-09-17 DIAGNOSIS — E7800 Pure hypercholesterolemia, unspecified: Secondary | ICD-10-CM | POA: Diagnosis not present

## 2024-09-17 DIAGNOSIS — R739 Hyperglycemia, unspecified: Secondary | ICD-10-CM | POA: Diagnosis not present

## 2024-09-17 DIAGNOSIS — I10 Essential (primary) hypertension: Secondary | ICD-10-CM | POA: Diagnosis not present

## 2024-09-17 LAB — CBC WITH DIFFERENTIAL/PLATELET
Basophils Absolute: 0 K/uL (ref 0.0–0.1)
Basophils Relative: 1.1 % (ref 0.0–3.0)
Eosinophils Absolute: 0.2 K/uL (ref 0.0–0.7)
Eosinophils Relative: 5.8 % — ABNORMAL HIGH (ref 0.0–5.0)
HCT: 38.1 % (ref 36.0–46.0)
Hemoglobin: 13 g/dL (ref 12.0–15.0)
Lymphocytes Relative: 30.7 % (ref 12.0–46.0)
Lymphs Abs: 1.3 K/uL (ref 0.7–4.0)
MCHC: 34.2 g/dL (ref 30.0–36.0)
MCV: 92.5 fl (ref 78.0–100.0)
Monocytes Absolute: 0.4 K/uL (ref 0.1–1.0)
Monocytes Relative: 9.6 % (ref 3.0–12.0)
Neutro Abs: 2.2 K/uL (ref 1.4–7.7)
Neutrophils Relative %: 52.8 % (ref 43.0–77.0)
Platelets: 214 K/uL (ref 150.0–400.0)
RBC: 4.12 Mil/uL (ref 3.87–5.11)
RDW: 15.1 % (ref 11.5–15.5)
WBC: 4.2 K/uL (ref 4.0–10.5)

## 2024-09-17 LAB — LIPID PANEL
Cholesterol: 160 mg/dL (ref 0–200)
HDL: 67.4 mg/dL (ref >39.00)
LDL Cholesterol: 76 mg/dL (ref 0–99)
NonHDL: 92.9
Total CHOL/HDL Ratio: 2
Triglycerides: 83 mg/dL (ref 0.0–149.0)
VLDL: 16.6 mg/dL (ref 0.0–40.0)

## 2024-09-17 LAB — BASIC METABOLIC PANEL WITH GFR
BUN: 19 mg/dL (ref 6–23)
CO2: 28 meq/L (ref 19–32)
Calcium: 9.7 mg/dL (ref 8.4–10.5)
Chloride: 102 meq/L (ref 96–112)
Creatinine, Ser: 0.9 mg/dL (ref 0.40–1.20)
GFR: 60.33 mL/min (ref >60.00)
Glucose, Bld: 92 mg/dL (ref 70–99)
Potassium: 4.1 meq/L (ref 3.5–5.1)
Sodium: 137 meq/L (ref 135–145)

## 2024-09-17 LAB — HEMOGLOBIN A1C: Hgb A1c MFr Bld: 6.5 % (ref 4.6–6.5)

## 2024-09-17 LAB — HEPATIC FUNCTION PANEL
ALT: 17 U/L (ref 0–35)
AST: 18 U/L (ref 0–37)
Albumin: 4.2 g/dL (ref 3.5–5.2)
Alkaline Phosphatase: 44 U/L (ref 39–117)
Bilirubin, Direct: 0.1 mg/dL (ref 0.0–0.3)
Total Bilirubin: 0.5 mg/dL (ref 0.2–1.2)
Total Protein: 6.1 g/dL (ref 6.0–8.3)

## 2024-09-18 ENCOUNTER — Ambulatory Visit: Payer: Self-pay | Admitting: Internal Medicine

## 2024-09-18 ENCOUNTER — Other Ambulatory Visit: Payer: Self-pay | Admitting: Internal Medicine

## 2024-09-18 DIAGNOSIS — E7800 Pure hypercholesterolemia, unspecified: Secondary | ICD-10-CM | POA: Diagnosis not present

## 2024-09-18 DIAGNOSIS — I10 Essential (primary) hypertension: Secondary | ICD-10-CM | POA: Diagnosis not present

## 2024-09-18 DIAGNOSIS — R600 Localized edema: Secondary | ICD-10-CM | POA: Diagnosis not present

## 2024-09-18 DIAGNOSIS — I351 Nonrheumatic aortic (valve) insufficiency: Secondary | ICD-10-CM | POA: Diagnosis not present

## 2024-09-18 DIAGNOSIS — R0609 Other forms of dyspnea: Secondary | ICD-10-CM | POA: Diagnosis not present

## 2024-09-19 ENCOUNTER — Ambulatory Visit: Admitting: Internal Medicine

## 2024-09-19 VITALS — BP 126/70 | HR 76 | Resp 16 | Ht 59.0 in | Wt 142.0 lb

## 2024-09-19 DIAGNOSIS — K449 Diaphragmatic hernia without obstruction or gangrene: Secondary | ICD-10-CM | POA: Diagnosis not present

## 2024-09-19 DIAGNOSIS — Z961 Presence of intraocular lens: Secondary | ICD-10-CM | POA: Diagnosis not present

## 2024-09-19 DIAGNOSIS — H353131 Nonexudative age-related macular degeneration, bilateral, early dry stage: Secondary | ICD-10-CM | POA: Diagnosis not present

## 2024-09-19 DIAGNOSIS — G4733 Obstructive sleep apnea (adult) (pediatric): Secondary | ICD-10-CM | POA: Diagnosis not present

## 2024-09-19 DIAGNOSIS — I10 Essential (primary) hypertension: Secondary | ICD-10-CM | POA: Diagnosis not present

## 2024-09-19 DIAGNOSIS — E7800 Pure hypercholesterolemia, unspecified: Secondary | ICD-10-CM

## 2024-09-19 DIAGNOSIS — R739 Hyperglycemia, unspecified: Secondary | ICD-10-CM

## 2024-09-19 DIAGNOSIS — M545 Low back pain, unspecified: Secondary | ICD-10-CM | POA: Diagnosis not present

## 2024-09-19 DIAGNOSIS — I890 Lymphedema, not elsewhere classified: Secondary | ICD-10-CM

## 2024-09-19 DIAGNOSIS — Z853 Personal history of malignant neoplasm of breast: Secondary | ICD-10-CM | POA: Diagnosis not present

## 2024-09-19 DIAGNOSIS — K219 Gastro-esophageal reflux disease without esophagitis: Secondary | ICD-10-CM

## 2024-09-19 DIAGNOSIS — H04123 Dry eye syndrome of bilateral lacrimal glands: Secondary | ICD-10-CM | POA: Diagnosis not present

## 2024-09-19 NOTE — Progress Notes (Signed)
 Subjective:    Patient ID: Carla Ryan, female    DOB: 28-Sep-1946, 78 y.o.   MRN: [id number]  Patient here for  Chief Complaint  Patient presents with   Medical Management of Chronic Issues    HPI Here for a scheduled follow up - f/u regarding hypertension and hypercholesterolemia. Using cpap. Seeing Dr Ryland - f/u neck and back pain. S/p ESI - L3-L4 09/2023. S/p injection in her hip - good results. Still has issues with her back - received  PT. Saw Whitney 08/21/24 - recommended to continue tylenol , gabapentin . Also prescribed robaxin  and thoracic spine MRI ordered.  Saw Dr Avanell 08/27/24 - s/p right C5-6 transforaminal epidural steroid injection. Saw GI 09/07/24 - f/u - epigastric pain/large hiatal hernia. Recommended to continue PPI/carafate  with prn simethicone. Schedule UGIS (is scheduled) - refer back to Dr Junie at [facility]. They have ordered gastric emptying study. Also with mild constipation, bloating and gas - recommended HBT.  Has the kit. Had f/u with pulmonary 06/04/24 - f/u OSA - on cpap. Recommended chin strap. Using regularly. Had f/u with [facility] 05/22/24 - recommended lymphedema pump. Saw cardiology 09/18/24 - f/u DOE. Recommended echo and carotid ultrasound.    Past Medical History:  Diagnosis Date   Abdominal hernia    Anxiety    situational stress and anxiety   Cancer (HCC) 2015   breast    Cataract cortical, senile, unspecified laterality    Chest pain    Exertional dyspnea    GERD (gastroesophageal reflux disease)    Hiatal hernia    Hiatal hernia    Hypercholesterolemia    Hyperlipidemia    Hypertension    Osteoarthritis    scoliosis, degenerative disc dz, knee, carpal tunnel   PONV (postoperative nausea and vomiting)    Sleep apnea    Past Surgical History:  Procedure Laterality Date   ABDOMINAL HYSTERECTOMY  07/2000   APPENDECTOMY  2000   APPENDECTOMY     BREAST BIOPSY Bilateral 2014   BREAST SURGERY Right 2015   lumpectomy   CARPAL TUNNEL  RELEASE     CATARACT EXTRACTION W/PHACO Right 02/16/2023   Procedure: CATARACT EXTRACTION PHACO AND INTRAOCULAR LENS PLACEMENT (IOC) RIGHT  7.20  00:39.2;  Surgeon: Mittie Gaskin, MD;  Location: [facility];  Service: Ophthalmology;  Laterality: Right;   CATARACT EXTRACTION W/PHACO Left 03/02/2023   Procedure: CATARACT EXTRACTION PHACO AND INTRAOCULAR LENS PLACEMENT (IOC) LEFT  6.41  00:42.0;  Surgeon: Mittie Gaskin, MD;  Location: [facility];  Service: Ophthalmology;  Laterality: Left;   COLONOSCOPY  2011   Gladis Mariner, [name]: Normal exam   COLONOSCOPY WITH PROPOFOL  N/A 04/03/2020   Procedure: COLONOSCOPY WITH PROPOFOL ;  Surgeon: Toledo, Ladell POUR, MD;  Location: [facility];  Service: Gastroenterology;  Laterality: N/A;   ESOPHAGOGASTRODUODENOSCOPY (EGD) WITH PROPOFOL  N/A 04/03/2020   Procedure: ESOPHAGOGASTRODUODENOSCOPY (EGD) WITH PROPOFOL ;  Surgeon: Toledo, Ladell POUR, MD;  Location: [facility];  Service: Gastroenterology;  Laterality: N/A;   HERNIA REPAIR  02/10/15   Laparoscopic Repair of Lateral Abdominal Wall Hernia  02/10/15   MASTECTOMY     UPPER GI ENDOSCOPY  05/10/2014   Gladis Mariner, [name] Bile gastritis, medium hiatal hernia. Incomplete visualization of the cardia and fundus.   Family History  Problem Relation Age of Onset   Hypertension Mother    Diabetes Mother    Multiple myeloma Mother    Hypertension Father    Multiple myeloma Father    Diabetes Sister  x2   Diabetes Brother    Hypertension Brother    Dementia Brother    Social History   Socioeconomic History   Marital status: Married    Spouse name: Maida   Number of children: 2   Years of education: Not on file   Highest education level: 12th grade  Occupational History   Occupation: retired  Tobacco Use   Smoking status: Never   Smokeless tobacco: Never  Vaping Use   Vaping status: Never Used  Substance and Sexual Activity   Alcohol use: Not Currently     Comment: wine occasionally   Drug use: No   Sexual activity: Yes  Other Topics Concern   Not on file  Social History Narrative   Not on file   Social Drivers of Health   Financial Resource Strain: Low Risk  (09/18/2024)   Overall Financial Resource Strain (CARDIA)    Difficulty of Paying Living Expenses: Not hard at all  Food Insecurity: No Food Insecurity (09/18/2024)   Hunger Vital Sign    Worried About Running Out of Food in the Last Year: Never true    Ran Out of Food in the Last Year: Never true  Transportation Needs: No Transportation Needs (09/18/2024)   PRAPARE - Administrator, Civil Service (Medical): No    Lack of Transportation (Non-Medical): No  Physical Activity: Inactive (09/18/2024)   Exercise Vital Sign    Days of Exercise per Week: 0 days    Minutes of Exercise per Session: Not on file  Stress: Stress Concern Present (09/18/2024)   [employer] of Occupational Health - Occupational Stress Questionnaire    Feeling of Stress: To some extent  Social Connections: Socially Integrated (09/18/2024)   Social Connection and Isolation Panel    Frequency of Communication with Friends and Family: Three times a week    Frequency of Social Gatherings with Friends and Family: Once a week    Attends Religious Services: More than 4 times per year    Active Member of [employer] or Organizations: Yes    Attends Engineer, structural: More than 4 times per year    Marital Status: Married     Review of Systems  Constitutional:  Negative for appetite change and unexpected weight change.  HENT:  Negative for congestion and sinus pressure.   Respiratory:  Negative for cough and chest tightness.        Breathing stable.   Cardiovascular:  Negative for chest pain and palpitations.       History of lymphedema  Gastrointestinal:  Negative for abdominal pain, nausea and vomiting.  Genitourinary:  Negative for difficulty urinating and dysuria.  Musculoskeletal:   Positive for back pain. Negative for myalgias.  Skin:  Negative for color change and rash.  Neurological:  Negative for dizziness and headaches.  Psychiatric/Behavioral:  Negative for agitation and dysphoric mood.        Objective:     BP 126/70   Pulse 76   Resp 16   Ht 4' 11 (1.499 m)   Wt 142 lb (64.4 kg)   SpO2 98%   BMI 28.68 kg/m  Wt Readings from Last 3 Encounters:  09/19/24 142 lb (64.4 kg)  06/04/24 145 lb (65.8 kg)  05/22/24 144 lb (65.3 kg)    Physical Exam Vitals reviewed.  Constitutional:      General: She is not in acute distress.    Appearance: Normal appearance.  HENT:     Head: Normocephalic and  atraumatic.     Right Ear: External ear normal.     Left Ear: External ear normal.     Mouth/Throat:     Pharynx: No oropharyngeal exudate or posterior oropharyngeal erythema.  Eyes:     General: No scleral icterus.       Right eye: No discharge.        Left eye: No discharge.     Conjunctiva/sclera: Conjunctivae normal.  Neck:     Thyroid : No thyromegaly.  Cardiovascular:     Rate and Rhythm: Normal rate and regular rhythm.  Pulmonary:     Effort: No respiratory distress.     Breath sounds: Normal breath sounds. No wheezing.  Abdominal:     General: Bowel sounds are normal.     Palpations: Abdomen is soft.     Tenderness: There is no abdominal tenderness.  Musculoskeletal:        General: No tenderness.     Cervical back: Neck supple. No tenderness.  Lymphadenopathy:     Cervical: No cervical adenopathy.  Skin:    Findings: No erythema or rash.  Neurological:     Mental Status: She is alert.  Psychiatric:        Mood and Affect: Mood normal.        Behavior: Behavior normal.         Outpatient Encounter Medications as of 09/19/2024  Medication Sig   aspirin 81 MG tablet Take 81 mg by mouth daily.   Azelastine  HCl 137 MCG/SPRAY SOLN USE 2 SPRAYS IN EACH       NOSTRIL TWICE DAILY AS     DIRECTED   Biotin 5 MG CAPS Take by mouth daily.    calcium  citrate-vitamin D  500-400 MG-UNIT chewable tablet Chew 1 tablet by mouth 2 (two) times daily.   cholecalciferol (VITAMIN D ) 1000 UNITS tablet Take 2,000 Units by mouth in the morning and at bedtime.    clindamycin (CLEOCIN T) 1 % external solution Apply topically.   Clobetasol Propionate (IMPOYZ) 0.025 % CREA    fluticasone  (FLONASE ) 50 MCG/ACT nasal spray USE 2 SPRAYS IN EACH NOSTRIL EVERY DAY   gabapentin  (NEURONTIN ) 100 MG capsule Take 2 capsules (200 mg total) by mouth at bedtime.   irbesartan  (AVAPRO ) 75 MG tablet TAKE 1 TABLET EVERY DAY   levocetirizine (XYZAL ) 5 MG tablet Take 1 tablet (5 mg total) by mouth every evening.   lidocaine  (LIDODERM ) 5 % Place 1 patch onto the skin as needed. Remove & Discard patch within 12 hours or as directed by MD   Magnesium Glycinate 100 MG CAPS Take 200 mg by mouth daily.   methocarbamol  (ROBAXIN ) 500 MG tablet Take 1 tablet (500 mg total) by mouth at bedtime as needed for muscle spasms. (Patient taking differently: Take 500 mg by mouth at bedtime as needed for muscle spasms. Pt taking prn)   Misc Natural Products (TART CHERRY ADVANCED) CAPS Take 1 capsule by mouth 2 (two) times daily. Reported on 03/04/2016   MULTIPLE MINERALS-VITAMINS PO    PEPPERMINT OIL PO Take by mouth.   PSYLLIUM FIBER PO    RABEprazole  (ACIPHEX ) 20 MG tablet Take 1 tablet (20 mg total) by mouth 2 (two) times daily.   RESTASIS 0.05 % ophthalmic emulsion Place 1 drop into both eyes 2 (two) times daily.   rosuvastatin  (CRESTOR ) 5 MG tablet TAKE 1 TABLET EVERY DAY   sucralfate  (CARAFATE ) 1 g tablet Take 1 tablet (1 g total) by mouth 2 (two) times daily.  triamterene -hydrochlorothiazide  (MAXZIDE -25) 37.5-25 MG tablet TAKE 1/2 TABLET EVERY DAY   [DISCONTINUED] Ascorbic Acid (VITAMIN C) 1000 MG tablet Take 1,000 mg by mouth daily.   No facility-administered encounter medications on file as of 09/19/2024.     Lab Results  Component Value Date   WBC 4.2 09/17/2024   HGB 13.0  09/17/2024   HCT 38.1 09/17/2024   PLT 214.0 09/17/2024   GLUCOSE 92 09/17/2024   CHOL 160 09/17/2024   TRIG 83.0 09/17/2024   HDL 67.40 09/17/2024   LDLDIRECT 146.9 01/31/2014   LDLCALC 76 09/17/2024   ALT 17 09/17/2024   AST 18 09/17/2024   NA 137 09/17/2024   K 4.1 09/17/2024   CL 102 09/17/2024   CREATININE 0.90 09/17/2024   BUN 19 09/17/2024   CO2 28 09/17/2024   TSH 2.12 05/04/2024   HGBA1C 6.5 09/17/2024    MR THORACIC SPINE WO CONTRAST Result Date: 08/30/2024 CLINICAL DATA:  78 year old female with right side pain in the mid back, scapula, posterior ribs. EXAM: MRI THORACIC SPINE WITHOUT CONTRAST TECHNIQUE: Multiplanar, multisequence MR imaging of the thoracic spine was performed. No intravenous contrast was administered. COMPARISON:  Cervical spine MRI 08/04/2024. CT Abdomen and Pelvis 11/16/2015. Chest radiographs 11/08/2023. FINDINGS: Limited cervical spine imaging: Stable to the cervical MRI last month. Thoracic spine segmentation: CT in 2016 suggests normal thoracic segmentation with hypoplastic ribs at T12. Alignment:  Abnormal. Chronic pronounced levoconvex scoliosis at the thoracolumbar junction, with advanced rotatory component of the spine there (see series 12, image 51 of this exam). Scoliotic curvature with apex at L1 was 36 degrees in 2016, and likely stable to progressed since that time (coronal series 116 of this exam). Associated straightening and mild reversal of the normal thoracic kyphosis. Focal kyphosis at the thoracolumbar junction. Relatively normal upper thoracic kyphosis, and comparatively mild associated dextroconvex thoracic spine curvature (series 116) Vertebrae: Normal background bone marrow signal. Maintained thoracic vertebral height. Chronic anterior wedging of L1 more so than T12 in association with the advanced chronic scoliosis there. Faint degenerative endplate marrow edema anteriorly at T12-L1. Benign T2 vertebral body hemangioma, smaller benign  hemangiomas at T9 and T12 (normal variant). No other marrow edema or evidence of acute osseous abnormality. Cord:  Normal.  Capacious thoracic spinal canal. Paraspinal and other soft tissues: Large gastric hiatal hernia eccentric to the left and was apparent on radiographs last year. Negative other visible chest and upper abdominal viscera. Negative visualized posterior paraspinal soft tissues. Disc levels: T1-T2: Mild to moderate facet hypertrophy.  No significant stenosis. T2-T3: Mild to moderate facet hypertrophy.  No stenosis. T3-T4: Negative. T4-T5: Negative. T5-T6: Negative. T6-T7: Negative. T7-T8: Negative. T8-T9: Negative. T9-T10: Mild to moderate facet hypertrophy greater on the left. No stenosis. T10-T11: Moderate to severe facet hypertrophy on the left. Moderate to severe left T10 neural foraminal stenosis (series 12, image 37). No spinal or right foraminal stenosis. T11-T12: Mild disc bulging. Moderate facet hypertrophy greater on the left. No spinal stenosis. Mild to moderate left T11 foraminal stenosis. T12-L1: Partially visible. Disc space loss. Circumferential disc osteophyte complex. Moderate facet hypertrophy. No definite stenosis. IMPRESSION: 1. Chronic moderate to severe levoconvex scoliosis at the thoracolumbar junction with an advanced rotatory component. Superimposed mild dextroconvex thoracic scoliosis, and mild reversal of the normal lower thoracic kyphosis. Mild degenerative endplate marrow edema at T12-L1. No other acute osseous abnormality. 2. Mild for age thoracic spine degeneration above T9 and no thoracic spinal stenosis. Lower thoracic disc and posterior element degeneration eccentric to the left.  Moderate to severe left T10 and up to moderate left T11 neural foraminal stenosis. 3. Large gastric hiatal hernia. Electronically Signed   By: VEAR Hurst [name]   On: 08/30/2024 06:47       Assessment & Plan:  Low back pain, unspecified back pain laterality, unspecified chronicity,  unspecified whether sciatica present Assessment & Plan: Seeing Dr Ryland - f/u neck and back pain. S/p ESI - L3-L4 09/2023. S/p injection in her hip - good results. Still has issues with her back - received  PT. Saw Whitney 08/21/24 - recommended to continue tylenol , gabapentin . Also prescribed robaxin  and thoracic spine MRI ordered.  Saw Dr Avanell 08/27/24 - s/p right C5-6 transforaminal epidural steroid injection.    Hyperglycemia Assessment & Plan: Low carb diet and exercise. Follow met b and A1c.   Orders: -     Hemoglobin A1c; Future  Hypercholesterolemia Assessment & Plan: Continue crestor .  Low cholesterol diet and exercise.  Follow lipid panel and liver function tests.   Lab Results  Component Value Date   CHOL 160 09/17/2024   HDL 67.40 09/17/2024   LDLCALC 76 09/17/2024   LDLDIRECT 146.9 01/31/2014   TRIG 83.0 09/17/2024   CHOLHDL 2 09/17/2024     Orders: -     Lipid panel; Future -     Basic metabolic panel with GFR; Future -     Hepatic function panel; Future  Gastroesophageal reflux disease without esophagitis Assessment & Plan: Has a large paraesophageal hernia.  On aciphex  and carafate .  Has seen GI.  Evaluated by Dr Velinda Greet.  Elected watchful waiting. Saw GI 09/07/24 - f/u - epigastric pain/large hiatal hernia. Recommended to continue PPI/carafate  with prn simethicone. Schedule UGIS (is scheduled) - refer back to Dr Junie at [facility]. They have ordered gastric emptying study.    History of breast cancer Assessment & Plan: Mammogram 02/24/23- Birads II.  Was evaluated 02/24/23 - oncology - [facility]. 9 years out - from breast cancer diagnosis. Off anti hormone therapy for one year. Graduated from the [facility] to survivorship program.  Mammogram 03/01/24 - Birads II.    Primary hypertension Assessment & Plan: Continue avapro  and triam/hctz.  Blood pressures as outlined.  No changes in medication today. Follow pressures.     Lymphedema Assessment & Plan: Follow [facility].    Obstructive sleep apnea Assessment & Plan: Continue cpap.    Paraesophageal hiatal hernia Assessment & Plan: Saw GI 09/07/24 - f/u - epigastric pain/large hiatal hernia. Recommended to continue PPI/carafate  with prn simethicone. Schedule UGIS (is scheduled) - refer back to Dr Junie at [facility]. They have ordered gastric emptying study.       Allena Hamilton, MD

## 2024-09-20 DIAGNOSIS — M5126 Other intervertebral disc displacement, lumbar region: Secondary | ICD-10-CM | POA: Diagnosis not present

## 2024-09-20 DIAGNOSIS — M5414 Radiculopathy, thoracic region: Secondary | ICD-10-CM | POA: Diagnosis not present

## 2024-09-20 DIAGNOSIS — M5416 Radiculopathy, lumbar region: Secondary | ICD-10-CM | POA: Diagnosis not present

## 2024-09-20 DIAGNOSIS — M48062 Spinal stenosis, lumbar region with neurogenic claudication: Secondary | ICD-10-CM | POA: Diagnosis not present

## 2024-09-20 DIAGNOSIS — M5412 Radiculopathy, cervical region: Secondary | ICD-10-CM | POA: Diagnosis not present

## 2024-09-20 DIAGNOSIS — M4802 Spinal stenosis, cervical region: Secondary | ICD-10-CM | POA: Diagnosis not present

## 2024-09-23 ENCOUNTER — Encounter: Payer: Self-pay | Admitting: Internal Medicine

## 2024-09-23 NOTE — Assessment & Plan Note (Signed)
 Continue avapro  and triam/hctz.  Blood pressures as outlined.  No changes in medication today. Follow pressures.

## 2024-09-23 NOTE — Assessment & Plan Note (Signed)
 Seeing Dr Ryland - f/u neck and back pain. S/p ESI - L3-L4 09/2023. S/p injection in her hip - good results. Still has issues with her back - received  PT. Saw Whitney 08/21/24 - recommended to continue tylenol , gabapentin . Also prescribed robaxin  and thoracic spine MRI ordered.  Saw Dr Avanell 08/27/24 - s/p right C5-6 transforaminal epidural steroid injection.

## 2024-09-23 NOTE — Assessment & Plan Note (Signed)
 Continue cpap.

## 2024-09-23 NOTE — Assessment & Plan Note (Signed)
 Saw GI 09/07/24 - f/u - epigastric pain/large hiatal hernia. Recommended to continue PPI/carafate  with prn simethicone. Schedule UGIS (is scheduled) - refer back to Dr Junie at [facility]. They have ordered gastric emptying study.

## 2024-09-23 NOTE — Assessment & Plan Note (Signed)
 Continue crestor .  Low cholesterol diet and exercise.  Follow lipid panel and liver function tests.   Lab Results  Component Value Date   CHOL 160 09/17/2024   HDL 67.40 09/17/2024   LDLCALC 76 09/17/2024   LDLDIRECT 146.9 01/31/2014   TRIG 83.0 09/17/2024   CHOLHDL 2 09/17/2024

## 2024-09-23 NOTE — Assessment & Plan Note (Signed)
 Mammogram 02/24/23- Birads II.  Was evaluated 02/24/23 - oncology - [facility]. 9 years out - from breast cancer diagnosis. Off anti hormone therapy for one year. Graduated from the [facility] to survivorship program.  Mammogram 03/01/24 - Birads II.

## 2024-09-23 NOTE — Assessment & Plan Note (Signed)
 Follow [facility].

## 2024-09-23 NOTE — Assessment & Plan Note (Signed)
 Low-carb diet and exercise.  Follow met b and A1c.

## 2024-09-23 NOTE — Assessment & Plan Note (Signed)
 Has a large paraesophageal hernia.  On aciphex  and carafate .  Has seen GI.  Evaluated by Dr Velinda Greet.  Elected watchful waiting. Saw GI 09/07/24 - f/u - epigastric pain/large hiatal hernia. Recommended to continue PPI/carafate  with prn simethicone. Schedule UGIS (is scheduled) - refer back to Dr Junie at [facility]. They have ordered gastric emptying study.

## 2024-09-25 ENCOUNTER — Ambulatory Visit
Admission: RE | Admit: 2024-09-25 | Discharge: 2024-09-25 | Disposition: A | Discharge status: Home / Self Care | Source: Ambulatory Visit | Attending: Gastroenterology | Admitting: Gastroenterology

## 2024-09-25 DIAGNOSIS — K449 Diaphragmatic hernia without obstruction or gangrene: Secondary | ICD-10-CM | POA: Insufficient documentation

## 2024-09-25 DIAGNOSIS — K439 Ventral hernia without obstruction or gangrene: Secondary | ICD-10-CM | POA: Insufficient documentation

## 2024-09-25 DIAGNOSIS — K224 Dyskinesia of esophagus: Secondary | ICD-10-CM | POA: Diagnosis not present

## 2024-09-25 DIAGNOSIS — K219 Gastro-esophageal reflux disease without esophagitis: Secondary | ICD-10-CM | POA: Diagnosis not present

## 2024-09-28 DIAGNOSIS — K449 Diaphragmatic hernia without obstruction or gangrene: Secondary | ICD-10-CM | POA: Diagnosis not present

## 2024-10-08 ENCOUNTER — Ambulatory Visit (INDEPENDENT_AMBULATORY_CARE_PROVIDER_SITE_OTHER)

## 2024-10-08 ENCOUNTER — Other Ambulatory Visit: Payer: Self-pay | Admitting: Internal Medicine

## 2024-10-08 DIAGNOSIS — Z23 Encounter for immunization: Secondary | ICD-10-CM

## 2024-10-09 DIAGNOSIS — R140 Abdominal distension (gaseous): Secondary | ICD-10-CM | POA: Diagnosis not present

## 2024-10-09 DIAGNOSIS — K5900 Constipation, unspecified: Secondary | ICD-10-CM | POA: Diagnosis not present

## 2024-10-10 DIAGNOSIS — R0609 Other forms of dyspnea: Secondary | ICD-10-CM | POA: Diagnosis not present

## 2024-10-10 DIAGNOSIS — I351 Nonrheumatic aortic (valve) insufficiency: Secondary | ICD-10-CM | POA: Diagnosis not present

## 2024-10-12 DIAGNOSIS — K449 Diaphragmatic hernia without obstruction or gangrene: Secondary | ICD-10-CM | POA: Diagnosis not present

## 2024-10-12 DIAGNOSIS — K219 Gastro-esophageal reflux disease without esophagitis: Secondary | ICD-10-CM | POA: Diagnosis not present

## 2024-10-15 ENCOUNTER — Ambulatory Visit: Payer: Medicare HMO | Admitting: *Deleted

## 2024-10-15 VITALS — Ht 59.0 in | Wt 138.0 lb

## 2024-10-15 DIAGNOSIS — Z0000 Encounter for general adult medical examination without abnormal findings: Secondary | ICD-10-CM | POA: Diagnosis not present

## 2024-10-15 NOTE — Progress Notes (Signed)
 Subjective:   Carla Ryan is a 78 y.o. who presents for a Medicare Wellness preventive visit.  As a reminder, Annual Wellness Visits don't include a physical exam, and some assessments may be limited, especially if this visit is performed virtually. We may recommend an in-person follow-up visit with your provider if needed.  Visit Complete: Virtual I connected with  Carla Ryan on 10/15/24 by a audio enabled telemedicine application and verified that I am speaking with the correct person using two identifiers.  Patient Location: Home  Provider Location: Home Office  I discussed the limitations of evaluation and management by telemedicine. The patient expressed understanding and agreed to proceed.  Vital Signs: Because this visit was a virtual/telehealth visit, some criteria may be missing or patient reported. Any vitals not documented were not able to be obtained and vitals that have been documented are patient reported.  VideoError- Librarian, academic were attempted between this provider and patient, however failed, due to patient having technical difficulties OR patient did not have access to video capability.  We continued and completed visit with audio only.   Persons Participating in Visit: Patient.  AWV Questionnaire: Yes: Patient Medicare AWV questionnaire was completed by the patient on 10/15/24; I have confirmed that all information answered by patient is correct and no changes since this date.  Cardiac Risk Factors include: advanced age (>34men, >37 women);dyslipidemia;hypertension     Objective:    Today's Vitals   10/15/24 1544  Weight: 138 lb (62.6 kg)  Height: 4' 11 (1.499 m)   Body mass index is 27.87 kg/m.     10/15/2024    4:03 PM 10/12/2023    2:24 PM 03/02/2023   10:24 AM 02/16/2023    8:32 AM 06/09/2022   12:41 PM 01/13/2021   10:09 AM 08/17/2020   12:34 PM  Advanced Directives  Does Patient Have a Medical Advance  Directive? Yes Yes Yes Yes Yes Yes No  Type of Estate Agent of [place];Living will Healthcare Power of [employer] Power of [employer] Power of [employer] Power of Attorney    Does patient want to make changes to medical advance directive?  No - Patient declined No - Patient declined  No - Patient declined No - Patient declined   Copy of Healthcare Power of Attorney in Chart? No - copy requested No - copy requested Yes - validated most recent copy scanned in chart (See row information) No - copy requested No - copy requested      Current Medications (verified) Outpatient Encounter Medications as of 10/15/2024  Medication Sig   aspirin 81 MG tablet Take 81 mg by mouth daily.   Azelastine  HCl 137 MCG/SPRAY SOLN USE 2 SPRAYS IN EACH       NOSTRIL TWICE DAILY AS     DIRECTED   Biotin 5 MG CAPS Take by mouth daily.   calcium  citrate-vitamin D  500-400 MG-UNIT chewable tablet Chew 1 tablet by mouth 2 (two) times daily.   cholecalciferol (VITAMIN D ) 1000 UNITS tablet Take 2,000 Units by mouth in the morning and at bedtime.    clindamycin (CLEOCIN T) 1 % external solution Apply topically.   Clobetasol Propionate (IMPOYZ) 0.025 % CREA    fluticasone  (FLONASE ) 50 MCG/ACT nasal spray USE 2 SPRAYS IN EACH NOSTRIL EVERY DAY   gabapentin  (NEURONTIN ) 100 MG capsule TAKE 2 CAPSULES AT BEDTIME (Patient taking differently: Take 100 mg by mouth at bedtime.)   irbesartan  (AVAPRO ) 75 MG  tablet TAKE 1 TABLET EVERY DAY   levocetirizine (XYZAL ) 5 MG tablet Take 1 tablet (5 mg total) by mouth every evening.   lidocaine  (LIDODERM ) 5 % Place 1 patch onto the skin as needed. Remove & Discard patch within 12 hours or as directed by MD   Magnesium Glycinate 100 MG CAPS Take 200 mg by mouth daily.   methocarbamol  (ROBAXIN ) 500 MG tablet Take 1 tablet (500 mg total) by mouth at bedtime as needed for muscle spasms.   Misc Natural Products (TART CHERRY ADVANCED) CAPS Take 1  capsule by mouth 2 (two) times daily. Reported on 03/04/2016   MULTIPLE MINERALS-VITAMINS PO    PEPPERMINT OIL PO Take by mouth.   PSYLLIUM FIBER PO    RABEprazole  (ACIPHEX ) 20 MG tablet Take 1 tablet (20 mg total) by mouth 2 (two) times daily.   RESTASIS 0.05 % ophthalmic emulsion Place 1 drop into both eyes 2 (two) times daily.   rosuvastatin  (CRESTOR ) 5 MG tablet TAKE 1 TABLET EVERY DAY   Simethicone (GAS-X MAXIMUM STRENGTH) 250 MG CAPS Take by mouth 4 (four) times daily.   sucralfate  (CARAFATE ) 1 g tablet Take 1 tablet (1 g total) by mouth 2 (two) times daily.   triamterene -hydrochlorothiazide  (MAXZIDE -25) 37.5-25 MG tablet TAKE 1/2 TABLET EVERY DAY   No facility-administered encounter medications on file as of 10/15/2024.    Allergies (verified) Patient has no known allergies.   History: Past Medical History:  Diagnosis Date   Abdominal hernia    Anxiety    situational stress and anxiety   Cancer (HCC) 2015   breast    Cataract cortical, senile, unspecified laterality    Chest pain    Exertional dyspnea    GERD (gastroesophageal reflux disease)    Hiatal hernia    Hiatal hernia    Hypercholesterolemia    Hyperlipidemia    Hypertension    Osteoarthritis    scoliosis, degenerative disc dz, knee, carpal tunnel   PONV (postoperative nausea and vomiting)    Sleep apnea    Past Surgical History:  Procedure Laterality Date   ABDOMINAL HYSTERECTOMY  07/2000   APPENDECTOMY  2000   APPENDECTOMY     BREAST BIOPSY Bilateral 2014   BREAST SURGERY Right 2015   lumpectomy   CARPAL TUNNEL RELEASE     CATARACT EXTRACTION W/PHACO Right 02/16/2023   Procedure: CATARACT EXTRACTION PHACO AND INTRAOCULAR LENS PLACEMENT (IOC) RIGHT  7.20  00:39.2;  Surgeon: Mittie Gaskin, MD;  Location: [facility];  Service: Ophthalmology;  Laterality: Right;   CATARACT EXTRACTION W/PHACO Left 03/02/2023   Procedure: CATARACT EXTRACTION PHACO AND INTRAOCULAR LENS PLACEMENT (IOC) LEFT   6.41  00:42.0;  Surgeon: Mittie Gaskin, MD;  Location: [facility];  Service: Ophthalmology;  Laterality: Left;   COLONOSCOPY  2011   Gladis Mariner, [name]: Normal exam   COLONOSCOPY WITH PROPOFOL  N/A 04/03/2020   Procedure: COLONOSCOPY WITH PROPOFOL ;  Surgeon: Toledo, Ladell POUR, MD;  Location: [facility];  Service: Gastroenterology;  Laterality: N/A;   ESOPHAGOGASTRODUODENOSCOPY (EGD) WITH PROPOFOL  N/A 04/03/2020   Procedure: ESOPHAGOGASTRODUODENOSCOPY (EGD) WITH PROPOFOL ;  Surgeon: Toledo, Ladell POUR, MD;  Location: [facility];  Service: Gastroenterology;  Laterality: N/A;   HERNIA REPAIR  02/10/15   Laparoscopic Repair of Lateral Abdominal Wall Hernia  02/10/15   MASTECTOMY     UPPER GI ENDOSCOPY  05/10/2014   Gladis Mariner, [name] Bile gastritis, medium hiatal hernia. Incomplete visualization of the cardia and fundus.   Family History  Problem Relation Age  of Onset   Hypertension Mother    Diabetes Mother    Multiple myeloma Mother    Hypertension Father    Multiple myeloma Father    Diabetes Sister        x2   Diabetes Brother    Hypertension Brother    Dementia Brother    Social History   Socioeconomic History   Marital status: Married    Spouse name: Maida   Number of children: 2   Years of education: Not on file   Highest education level: 12th grade  Occupational History   Occupation: retired  Tobacco Use   Smoking status: Never   Smokeless tobacco: Never  Vaping Use   Vaping status: Never Used  Substance and Sexual Activity   Alcohol use: Not Currently    Comment: wine occasionally   Drug use: No   Sexual activity: Yes  Other Topics Concern   Not on file  Social History Narrative   retired   Chief Executive Officer Drivers of Corporate Investment Banker Strain: Low Risk  (10/15/2024)   Overall Financial Resource Strain (CARDIA)    Difficulty of Paying Living Expenses: Not hard at all  Food Insecurity: No Food Insecurity (10/15/2024)   Hunger Vital  Sign    Worried About Running Out of Food in the Last Year: Never true    Ran Out of Food in the Last Year: Never true  Transportation Needs: No Transportation Needs (10/15/2024)   PRAPARE - Administrator, Civil Service (Medical): No    Lack of Transportation (Non-Medical): No  Physical Activity: Inactive (10/15/2024)   Exercise Vital Sign    Days of Exercise per Week: 0 days    Minutes of Exercise per Session: 0 min  Stress: No Stress Concern Present (10/15/2024)   [employer] of Occupational Health - Occupational Stress Questionnaire    Feeling of Stress: Only a little  Recent Concern: Stress - Stress Concern Present (09/18/2024)   [employer] of Occupational Health - Occupational Stress Questionnaire    Feeling of Stress: To some extent  Social Connections: Socially Integrated (10/15/2024)   Social Connection and Isolation Panel    Frequency of Communication with Friends and Family: Three times a week    Frequency of Social Gatherings with Friends and Family: Twice a week    Attends Religious Services: More than 4 times per year    Active Member of [employer] or Organizations: Yes    Attends Engineer, Structural: More than 4 times per year    Marital Status: Married    Tobacco Counseling Counseling given: Not Answered    Clinical Intake:  Pre-visit preparation completed: Yes  Pain : No/denies pain     BMI - recorded: 27.87 Nutritional Status: BMI 25 -29 Overweight Nutritional Risks: None Diabetes: No  Lab Results  Component Value Date   HGBA1C 6.5 09/17/2024   HGBA1C 6.2 05/04/2024   HGBA1C 6.4 01/13/2024     How often do you need to have someone help you when you read instructions, pamphlets, or other written materials from your doctor or pharmacy?: 1 - Never  Interpreter Needed?: No  Information entered by :: R. Imara Standiford LPN   Activities of Daily Living     10/15/2024    3:04 PM  In your present state of health, do you have  any difficulty performing the following activities:  Hearing? 1  Vision? 0  Difficulty concentrating or making decisions? 0  Walking or climbing stairs? 0  Dressing or bathing? 0  Doing errands, shopping? 0  Preparing Food and eating ? N  Using the Toilet? N  In the past six months, have you accidently leaked urine? N  Do you have problems with loss of bowel control? Y  Managing your Medications? N  Managing your Finances? N  Housekeeping or managing your Housekeeping? N    Patient Care Team: Glendia Shad, MD as PCP - General (Internal Medicine) Clarisa Therisa RIGGERS (Cardiology) Jane Delmar Pike, NP as Nurse Practitioner (Gastroenterology) Mittie Gaskin, MD as Referring Physician (Ophthalmology) Avanell Katz, MD as Referring Physician (Physical Medicine and Rehabilitation) Meeler, Benton CROME, FNP (Family Medicine) Cathlyn Seal, MD as Referring Physician (Dermatology)  I have updated your Care Teams any recent Medical Services you may have received from other providers in the past year.     Assessment:   This is a routine wellness examination for [place].  Hearing/Vision screen Hearing Screening - Comments:: Wears aids Vision Screening - Comments:: glasses   Goals Addressed             This Visit's Progress    Patient Stated       Wants to try to eat healthy       Depression Screen     10/15/2024    3:56 PM 10/12/2023    2:22 PM 08/31/2023   11:31 AM 01/27/2023   11:16 AM 10/04/2022    8:17 AM 08/27/2022   10:05 AM 06/09/2022   12:44 PM  PHQ 2/9 Scores  PHQ - 2 Score 0 0 0 0 1 0 0  PHQ- 9 Score 2 0 2        Fall Risk     10/15/2024    3:04 PM 10/12/2023    2:25 PM 08/31/2023   11:31 AM 01/27/2023   11:15 AM 10/04/2022    8:17 AM  Fall Risk   Falls in the past year? 0 0 0 0 0  Number falls in past yr: 0 0 0 0 0  Injury with Fall? 0 0 0 0 0  Risk for fall due to : No Fall Risks No Fall Risks No Fall Risks No Fall Risks No Fall  Risks  Follow up Falls evaluation completed;Falls prevention discussed Falls prevention discussed  Falls evaluation completed Falls evaluation completed      Data saved with a previous flowsheet row definition    MEDICARE RISK AT HOME:  Medicare Risk at Home Any stairs in or around the home?: (Patient-Rptd) Yes If so, are there any without handrails?: (Patient-Rptd) No Home free of loose throw rugs in walkways, pet beds, electrical cords, etc?: (Patient-Rptd) Yes Adequate lighting in your home to reduce risk of falls?: (Patient-Rptd) Yes Life alert?: (Patient-Rptd) No Use of a cane, walker or w/c?: (Patient-Rptd) No Grab bars in the bathroom?: (Patient-Rptd) No Shower chair or bench in shower?: (Patient-Rptd) No Elevated toilet seat or a handicapped toilet?: (Patient-Rptd) Yes  TIMED UP AND GO:  Was the test performed?  No  Cognitive Function: 6CIT completed    05/03/2018    9:40 AM 05/02/2017    9:35 AM 11/07/2015    9:33 AM  MMSE - Mini Mental State Exam  Orientation to time 5 5  5    Orientation to Place 5 5  5    Registration 3 3  3    Attention/ Calculation 5 5  5    Recall 3 3  3    Language- name 2 objects 2 2  2    Language- repeat  1 1 1   Language- follow 3 step command 3 3  3    Language- read & follow direction 1 1  1    Write a sentence 1 1  1    Copy design 1 1  1    Total score 30 30  30       Data saved with a previous flowsheet row definition        10/15/2024    4:05 PM 10/12/2023    2:27 PM  6CIT Screen  What Year? 0 points 0 points  What month? 0 points 0 points  What time? 0 points 0 points  Count back from 20 0 points 0 points  Months in reverse 0 points 0 points  Repeat phrase 0 points 0 points  Total Score 0 points 0 points    Immunizations Immunization History  Administered Date(s) Administered   Fluad Quad(high Dose 65+) 10/17/2019, 09/22/2020, 10/12/2021, 10/26/2022   Fluad Trivalent(High Dose 65+) 10/04/2023   INFLUENZA, HIGH DOSE SEASONAL  PF 11/07/2015, 09/03/2016, 10/05/2017, 10/12/2018, 10/08/2024   Influenza Split 10/25/2012   Influenza,inj,Quad PF,6+ Mos 09/28/2013, 10/08/2014   PFIZER(Purple Top)SARS-COV-2 Vaccination 01/07/2020, 01/28/2020   Pneumococcal Conjugate-13 11/27/2013   Pneumococcal Polysaccharide-23 11/05/2015   Td 12/04/2015   Tdap 12/20/2004   Zoster Recombinant(Shingrix ) 10/02/2019, 03/25/2020   Zoster, Live 12/20/2008    Screening Tests Health Maintenance  Topic Date Due   COVID-19 Vaccine (3 - Pfizer risk series) 02/25/2020   Mammogram  02/24/2024   Medicare Annual Wellness (AWV)  10/11/2024   DTaP/Tdap/Td (3 - Td or Tdap) 12/03/2025   DEXA SCAN  01/19/2026   Pneumococcal Vaccine: 50+ Years  Completed   Influenza Vaccine  Completed   Zoster Vaccines- Shingrix   Completed   Meningococcal B Vaccine  Aged Out   Colonoscopy  Discontinued   Hepatitis C Screening  Discontinued    Health Maintenance Items Addressed: Patent declines covid vaccine.  Patient stated that she gets her mammogram at [facility] and no order is needed.   Additional Screening:  Vision Screening: Recommended annual ophthalmology exams for early detection of glaucoma and other disorders of the eye. Is the patient up to date with their annual eye exam?  Yes  Who is the provider or what is the name of the office in which the patient attends annual eye exams?  [facility]  Dental Screening: Recommended annual dental exams for proper oral hygiene  Community Resource Referral / Chronic Care Management: CRR required this visit?  No   CCM required this visit?  No   Plan:    I have personally reviewed and noted the following in the patient's chart:   Medical and social history Use of alcohol, tobacco or illicit drugs  Current medications and supplements including opioid prescriptions. Patient is not currently taking opioid prescriptions. Functional ability and status Nutritional status Physical activity Advanced  directives List of other physicians Hospitalizations, surgeries, and ER visits in previous 12 months Vitals Screenings to include cognitive, depression, and falls Referrals and appointments  In addition, I have reviewed and discussed with patient certain preventive protocols, quality metrics, and best practice recommendations. A written personalized care plan for preventive services as well as general preventive health recommendations were provided to patient.   Angeline Fredericks, LPN   [id number]   After Visit Summary: (MyChart) Due to this being a telephonic visit, the after visit summary with patients personalized plan was offered to patient via MyChart   Notes: Nothing significant to report at this time.

## 2024-10-15 NOTE — Patient Instructions (Signed)
 Carla Ryan,  Thank you for taking the time for your Medicare Wellness Visit. I appreciate your continued commitment to your health goals. Please review the care plan we discussed, and feel free to reach out if I can assist you further.  Medicare recommends these wellness visits once per year to help you and your care team stay ahead of potential health issues. These visits are designed to focus on prevention, allowing your provider to concentrate on managing your acute and chronic conditions during your regular appointments.  Please note that Annual Wellness Visits do not include a physical exam. Some assessments may be limited, especially if the visit was conducted virtually. If needed, we may recommend a separate in-person follow-up with your provider.  Ongoing Care Seeing your primary care provider every 3 to 6 months helps us  monitor your health and provide consistent, personalized care.  Remember to keep your vaccines up to date.  Referrals If a referral was made during today's visit and you haven't received any updates within two weeks, please contact the referred provider directly to check on the status.  Recommended Screenings:  Health Maintenance  Topic Date Due   COVID-19 Vaccine (3 - Pfizer risk series) 02/25/2020   Breast Cancer Screening  02/24/2024   Medicare Annual Wellness Visit  10/15/2025   DTaP/Tdap/Td vaccine (3 - Td or Tdap) 12/03/2025   DEXA scan (bone density measurement)  01/19/2026   Pneumococcal Vaccine for age over 13  Completed   Flu Shot  Completed   Zoster (Shingles) Vaccine  Completed   Meningitis B Vaccine  Aged Out   Colon Cancer Screening  Discontinued   Hepatitis C Screening  Discontinued       10/15/2024    4:03 PM  Advanced Directives  Does Patient Have a Medical Advance Directive? Yes  Type of Estate Agent of [place];Living will  Copy of Healthcare Power of Attorney in Chart? No - copy requested   Advance Care  Planning is important because it: Ensures you receive medical care that aligns with your values, goals, and preferences. Provides guidance to your family and loved ones, reducing the emotional burden of decision-making during critical moments.  Vision: Annual vision screenings are recommended for early detection of glaucoma, cataracts, and diabetic retinopathy. These exams can also reveal signs of chronic conditions such as diabetes and high blood pressure.  Dental: Annual dental screenings help detect early signs of oral cancer, gum disease, and other conditions linked to overall health, including heart disease and diabetes.  Please see the attached documents for additional preventive care recommendations.

## 2024-10-24 ENCOUNTER — Other Ambulatory Visit: Payer: Self-pay | Admitting: Internal Medicine

## 2024-10-26 DIAGNOSIS — M5416 Radiculopathy, lumbar region: Secondary | ICD-10-CM | POA: Diagnosis not present

## 2024-10-26 DIAGNOSIS — M48062 Spinal stenosis, lumbar region with neurogenic claudication: Secondary | ICD-10-CM | POA: Diagnosis not present

## 2024-10-26 DIAGNOSIS — M5126 Other intervertebral disc displacement, lumbar region: Secondary | ICD-10-CM | POA: Diagnosis not present

## 2024-11-14 DIAGNOSIS — I779 Disorder of arteries and arterioles, unspecified: Secondary | ICD-10-CM | POA: Diagnosis not present

## 2024-11-14 DIAGNOSIS — I351 Nonrheumatic aortic (valve) insufficiency: Secondary | ICD-10-CM | POA: Diagnosis not present

## 2024-11-14 DIAGNOSIS — I10 Essential (primary) hypertension: Secondary | ICD-10-CM | POA: Diagnosis not present

## 2024-12-19 ENCOUNTER — Other Ambulatory Visit: Payer: Self-pay | Admitting: Internal Medicine

## 2024-12-25 ENCOUNTER — Ambulatory Visit (INDEPENDENT_AMBULATORY_CARE_PROVIDER_SITE_OTHER): Admitting: Vascular Surgery

## 2024-12-25 ENCOUNTER — Encounter (INDEPENDENT_AMBULATORY_CARE_PROVIDER_SITE_OTHER): Payer: Self-pay | Admitting: Vascular Surgery

## 2024-12-25 VITALS — BP 127/71 | HR 59 | Resp 17 | Ht 59.0 in | Wt 140.4 lb

## 2024-12-25 DIAGNOSIS — I10 Essential (primary) hypertension: Secondary | ICD-10-CM | POA: Diagnosis not present

## 2024-12-25 DIAGNOSIS — E7800 Pure hypercholesterolemia, unspecified: Secondary | ICD-10-CM | POA: Diagnosis not present

## 2024-12-25 DIAGNOSIS — I890 Lymphedema, not elsewhere classified: Secondary | ICD-10-CM

## 2024-12-25 NOTE — Addendum Note (Signed)
 Addended by: MAREA SELINDA RAMAN on: 12/25/2024 01:19 PM   Modules accepted: Level of Service

## 2024-12-25 NOTE — Progress Notes (Signed)
 "   MRN : [id number]  Carla Ryan is a 79 y.o. (1946-10-11) female who presents with chief complaint of  Chief Complaint  Patient presents with   Follow-up     fu 6 months  .  History of Present Illness:   Discussed the use of AI scribe software for clinical note transcription with the patient, who gave verbal consent to proceed.  History of Present Illness Carla Ryan is an 79 year old female with chronic lower extremity lymphedema who presents for follow-up regarding pneumatic compression pump therapy.  She has persistent bilateral lower extremity swelling of the feet and legs that is cosmetically bothersome, swells daily and has had some skin changes with thickening but no ulceration  She has not received the previously ordered pneumatic compression pump despite multiple contacts with the supplier and uncertainty regarding insurance approval status.  Her cardiology provider encouraged use of the pump, and she now seeks clarification on its benefits for swelling as well as its design, application, and mechanism.    Results   Current Outpatient Medications  Medication Sig Dispense Refill   aspirin 81 MG tablet Take 81 mg by mouth daily.     Azelastine  HCl 137 MCG/SPRAY SOLN USE 2 SPRAYS IN EACH       NOSTRIL TWICE DAILY AS     DIRECTED 120 mL 3   Biotin 5 MG CAPS Take by mouth daily.     calcium  citrate-vitamin D  500-400 MG-UNIT chewable tablet Chew 1 tablet by mouth 2 (two) times daily.     cholecalciferol (VITAMIN D ) 1000 UNITS tablet Take 2,000 Units by mouth in the morning and at bedtime.      clindamycin (CLEOCIN T) 1 % external solution Apply topically.     Clobetasol Propionate (IMPOYZ) 0.025 % CREA      fluticasone  (FLONASE ) 50 MCG/ACT nasal spray USE 2 SPRAYS IN EACH NOSTRIL EVERY DAY 48 g 3   gabapentin  (NEURONTIN ) 100 MG capsule TAKE 2 CAPSULES AT BEDTIME (Patient taking differently: Take 100 mg by mouth at bedtime.) 180 capsule 2   irbesartan  (AVAPRO ) 75  MG tablet TAKE 1 TABLET EVERY DAY 90 tablet 3   levocetirizine (XYZAL ) 5 MG tablet TAKE 1 TABLET EVERY EVENING 90 tablet 3   lidocaine  (LIDODERM ) 5 % Place 1 patch onto the skin as needed. Remove & Discard patch within 12 hours or as directed by MD     Magnesium Glycinate 100 MG CAPS Take 200 mg by mouth daily.     methocarbamol  (ROBAXIN ) 500 MG tablet Take 1 tablet (500 mg total) by mouth at bedtime as needed for muscle spasms. 30 tablet 0   MIEBO 1.338 GM/ML SOLN Apply 1 drop to eye 4 (four) times daily.     Misc Natural Products (TART CHERRY ADVANCED) CAPS Take 1 capsule by mouth 2 (two) times daily. Reported on 03/04/2016     MULTIPLE MINERALS-VITAMINS PO      PEPPERMINT OIL PO Take by mouth.     PSYLLIUM FIBER PO      RABEprazole  (ACIPHEX ) 20 MG tablet Take 1 tablet (20 mg total) by mouth 2 (two) times daily. 180 tablet 1   rosuvastatin  (CRESTOR ) 5 MG tablet TAKE 1 TABLET EVERY DAY 90 tablet 3   Simethicone (GAS-X MAXIMUM STRENGTH) 250 MG CAPS Take by mouth 4 (four) times daily.     sucralfate  (CARAFATE ) 1 g tablet Take 1 tablet (1 g total) by mouth 2 (two) times daily. 180 tablet 1  RESTASIS 0.05 % ophthalmic emulsion Place 1 drop into both eyes 2 (two) times daily. (Patient not taking: Reported on 12/25/2024)     triamterene -hydrochlorothiazide (MAXZIDE-25) 37.5-25 MG tablet TAKE 1/2 TABLET EVERY DAY 45 tablet 3   No current facility-administered medications for this visit.    Past Medical History:  Diagnosis Date   Abdominal hernia    Anxiety    situational stress and anxiety   Cancer (HCC) 2015   breast    Cataract cortical, senile, unspecified laterality    Chest pain    Exertional dyspnea    GERD (gastroesophageal reflux disease)    Hiatal hernia    Hiatal hernia    Hypercholesterolemia    Hyperlipidemia    Hypertension    Osteoarthritis    scoliosis, degenerative disc dz, knee, carpal tunnel   PONV (postoperative nausea and vomiting)    Sleep apnea     Past  Surgical History:  Procedure Laterality Date   ABDOMINAL HYSTERECTOMY  07/2000   APPENDECTOMY  2000   APPENDECTOMY     BREAST BIOPSY Bilateral 2014   BREAST SURGERY Right 2015   lumpectomy   CARPAL TUNNEL RELEASE     CATARACT EXTRACTION W/PHACO Right 02/16/2023   Procedure: CATARACT EXTRACTION PHACO AND INTRAOCULAR LENS PLACEMENT (IOC) RIGHT  7.20  00:39.2;  Surgeon: Mittie Gaskin, MD;  Location: [facility];  Service: Ophthalmology;  Laterality: Right;   CATARACT EXTRACTION W/PHACO Left 03/02/2023   Procedure: CATARACT EXTRACTION PHACO AND INTRAOCULAR LENS PLACEMENT (IOC) LEFT  6.41  00:42.0;  Surgeon: Mittie Gaskin, MD;  Location: [facility];  Service: Ophthalmology;  Laterality: Left;   COLONOSCOPY  2011   Gladis Mariner, [name]: Normal exam   COLONOSCOPY WITH PROPOFOL  N/A 04/03/2020   Procedure: COLONOSCOPY WITH PROPOFOL ;  Surgeon: Toledo, Ladell POUR, MD;  Location: [facility];  Service: Gastroenterology;  Laterality: N/A;   ESOPHAGOGASTRODUODENOSCOPY (EGD) WITH PROPOFOL  N/A 04/03/2020   Procedure: ESOPHAGOGASTRODUODENOSCOPY (EGD) WITH PROPOFOL ;  Surgeon: Toledo, Ladell POUR, MD;  Location: [facility];  Service: Gastroenterology;  Laterality: N/A;   HERNIA REPAIR  02/10/15   Laparoscopic Repair of Lateral Abdominal Wall Hernia  02/10/15   MASTECTOMY     UPPER GI ENDOSCOPY  05/10/2014   Gladis Mariner, [name] Bile gastritis, medium hiatal hernia. Incomplete visualization of the cardia and fundus.     Social History[1]    Family History  Problem Relation Age of Onset   Hypertension Mother    Diabetes Mother    Multiple myeloma Mother    Hypertension Father    Multiple myeloma Father    Diabetes Sister        x2   Diabetes Brother    Hypertension Brother    Dementia Brother      Allergies[2]     REVIEW OF SYSTEMS (Negative unless checked)   Constitutional: [] Weight loss  [] Fever  [] Chills Cardiac: [] Chest pain   [] Chest pressure    [] Palpitations   [] Shortness of breath when laying flat   [] Shortness of breath at rest   [x] Shortness of breath with exertion. Vascular:  [] Pain in legs with walking   [] Pain in legs at rest   [] Pain in legs when laying flat   [] Claudication   [] Pain in feet when walking  [] Pain in feet at rest  [] Pain in feet when laying flat   [] History of DVT   [] Phlebitis   [x] Swelling in legs   [] Varicose veins   [] Non-healing ulcers Pulmonary:   [] Uses home oxygen   []   Productive cough   [] Hemoptysis   [] Wheeze  [] COPD   [] Asthma Neurologic:  [x] Dizziness  [] Blackouts   [] Seizures   [] History of stroke   [] History of TIA  [] Aphasia   [] Temporary blindness   [] Dysphagia   [] Weakness or numbness in arms   [] Weakness or numbness in legs Musculoskeletal:  [x] Arthritis   [] Joint swelling   [x] Joint pain   [] Low back pain Hematologic:  [] Easy bruising  [] Easy bleeding   [] Hypercoagulable state   [] Anemic  [] Hepatitis Gastrointestinal:  [] Blood in stool   [] Vomiting blood  [x] Gastroesophageal reflux/heartburn   [] Abdominal pain Genitourinary:  [x] Chronic kidney disease   [] Difficult urination  [] Frequent urination  [] Burning with urination   [] Hematuria Skin:  [] Rashes   [] Ulcers   [] Wounds Psychological:  [] History of anxiety   []  History of major depression.  Physical Examination  BP 127/71   Pulse (!) 59   Resp 17   Ht 4' 11 (1.499 m)   Wt 140 lb 6.4 oz (63.7 kg)   BMI 28.36 kg/m  Gen:  WD/WN, NAD.  Appears younger than stated age Head: [place]/AT, No temporalis wasting. Ear/Nose/Throat: Hearing grossly intact, nares w/o erythema or drainage Eyes: Conjunctiva clear. Sclera non-icteric Neck: Supple.  Trachea midline Pulmonary:  Good air movement, no use of accessory muscles.  Cardiac: Bradycardic Vascular:  Vessel Right Left  Radial Palpable Palpable           Musculoskeletal: M/S 5/5 throughout.  No deformity or atrophy.  Trace right lower extremity edema, 1+ left lower extremity edema.  Mild  hyperpigmentation changes are present predominantly on the left leg. Neurologic: Sensation grossly intact in extremities.  Symmetrical.  Speech is fluent.  Psychiatric: Judgment intact, Mood & affect appropriate for pt's clinical situation. Dermatologic: No rashes or ulcers noted.  No cellulitis or open wounds.  Physical Exam     Labs No results found for this or any previous visit (from the past 2160 hours).  Radiology No results found.  Assessment/Plan Assessment & Plan Lymphedema Chronic bilateral lower extremity lymphedema, causing cosmetic discomfort without significant limb or life-threatening complications. - Attempted to obtain pneumatic compression pump; insurance approval delayed. - Discussed pump's role in reducing swelling and improving comfort. - Explained pump design, mechanism, application, and effects, including increased urination. - Agreed to continue efforts to obtain pump through insurance and keep her informed. - Advised to contact office if issues arise with pump use. - Recommended annual follow-up unless new issues arise.  Hypercholesterolemia lipid control important in reducing the progression of atherosclerotic disease. Continue statin therapy     Hypertension blood pressure control important in reducing the progression of atherosclerotic disease. On appropriate oral medications.    Selinda Gu, MD  12/25/2024 9:50 AM    This note was created with Dragon medical transcription system.  Any errors from dictation are purely unintentional     [1]  Social History Tobacco Use   Smoking status: Never   Smokeless tobacco: Never  Vaping Use   Vaping status: Never Used  Substance Use Topics   Alcohol use: Not Currently    Comment: wine occasionally   Drug use: No  [2] No Known Allergies  "

## 2025-01-23 ENCOUNTER — Other Ambulatory Visit

## 2025-01-23 ENCOUNTER — Ambulatory Visit: Payer: Self-pay | Admitting: Internal Medicine

## 2025-01-23 DIAGNOSIS — E7800 Pure hypercholesterolemia, unspecified: Secondary | ICD-10-CM

## 2025-01-23 DIAGNOSIS — R739 Hyperglycemia, unspecified: Secondary | ICD-10-CM

## 2025-01-23 LAB — HEMOGLOBIN A1C: Hgb A1c MFr Bld: 6 % (ref 4.6–6.5)

## 2025-01-23 LAB — BASIC METABOLIC PANEL WITH GFR
BUN: 19 mg/dL (ref 6–23)
CO2: 29 meq/L (ref 19–32)
Calcium: 9.9 mg/dL (ref 8.4–10.5)
Chloride: 102 meq/L (ref 96–112)
Creatinine, Ser: 0.96 mg/dL (ref 0.40–1.20)
GFR: 55.7 mL/min — ABNORMAL LOW
Glucose, Bld: 95 mg/dL (ref 70–99)
Potassium: 4.3 meq/L (ref 3.5–5.1)
Sodium: 140 meq/L (ref 135–145)

## 2025-01-23 LAB — LIPID PANEL
Cholesterol: 165 mg/dL (ref 28–200)
HDL: 67.1 mg/dL
LDL Cholesterol: 75 mg/dL (ref 10–99)
NonHDL: 98.14
Total CHOL/HDL Ratio: 2
Triglycerides: 115 mg/dL (ref 10.0–149.0)
VLDL: 23 mg/dL (ref 0.0–40.0)

## 2025-01-23 LAB — HEPATIC FUNCTION PANEL
ALT: 16 U/L (ref 3–35)
AST: 17 U/L (ref 5–37)
Albumin: 4.3 g/dL (ref 3.5–5.2)
Alkaline Phosphatase: 44 U/L (ref 39–117)
Bilirubin, Direct: 0.1 mg/dL (ref 0.1–0.3)
Total Bilirubin: 0.5 mg/dL (ref 0.2–1.2)
Total Protein: 6.3 g/dL (ref 6.0–8.3)

## 2025-01-24 ENCOUNTER — Other Ambulatory Visit

## 2025-01-28 ENCOUNTER — Ambulatory Visit: Admitting: Internal Medicine

## 2025-10-16 ENCOUNTER — Ambulatory Visit

## 2026-01-14 ENCOUNTER — Ambulatory Visit (INDEPENDENT_AMBULATORY_CARE_PROVIDER_SITE_OTHER): Admitting: Vascular Surgery
